# Patient Record
Sex: Female | Born: 1935 | Race: White | Hispanic: No | Marital: Married | Smoking: Never smoker
Health system: Southern US, Community
[De-identification: ages and names within clinical notes are randomized; demographics above are authoritative.]

## PROBLEM LIST (undated history)

## (undated) DIAGNOSIS — E079 Disorder of thyroid, unspecified: Secondary | ICD-10-CM

## (undated) DIAGNOSIS — T7840XA Allergy, unspecified, initial encounter: Secondary | ICD-10-CM

## (undated) DIAGNOSIS — N390 Urinary tract infection, site not specified: Secondary | ICD-10-CM

## (undated) DIAGNOSIS — I10 Essential (primary) hypertension: Secondary | ICD-10-CM

## (undated) DIAGNOSIS — K2270 Barrett's esophagus without dysplasia: Secondary | ICD-10-CM

## (undated) DIAGNOSIS — C801 Malignant (primary) neoplasm, unspecified: Secondary | ICD-10-CM

## (undated) DIAGNOSIS — K219 Gastro-esophageal reflux disease without esophagitis: Secondary | ICD-10-CM

## (undated) HISTORY — DX: Urinary tract infection, site not specified: N39.0

## (undated) HISTORY — PX: TONSILLECTOMY: SUR1361

## (undated) HISTORY — PX: POLYPECTOMY: SHX149

## (undated) HISTORY — DX: Barrett's esophagus without dysplasia: K22.70

## (undated) HISTORY — DX: Allergy, unspecified, initial encounter: T78.40XA

## (undated) HISTORY — PX: TUBAL LIGATION: SHX77

## (undated) HISTORY — DX: Gastro-esophageal reflux disease without esophagitis: K21.9

## (undated) HISTORY — DX: Essential (primary) hypertension: I10

## (undated) HISTORY — PX: TOTAL THYROIDECTOMY: SHX2547

## (undated) HISTORY — DX: Disorder of thyroid, unspecified: E07.9

## (undated) HISTORY — PX: APPENDECTOMY: SHX54

## (undated) HISTORY — PX: COLONOSCOPY: SHX174

## (undated) HISTORY — DX: Malignant (primary) neoplasm, unspecified: C80.1

## (undated) HISTORY — PX: COLON SURGERY: SHX602

---

## 1998-05-18 ENCOUNTER — Other Ambulatory Visit: Admission: RE | Admit: 1998-05-18 | Discharge: 1998-05-18 | Payer: Self-pay | Admitting: Obstetrics & Gynecology

## 1999-05-21 ENCOUNTER — Other Ambulatory Visit: Admission: RE | Admit: 1999-05-21 | Discharge: 1999-05-21 | Payer: Self-pay | Admitting: Obstetrics & Gynecology

## 2000-01-16 ENCOUNTER — Encounter: Payer: Self-pay | Admitting: Obstetrics & Gynecology

## 2000-01-16 ENCOUNTER — Ambulatory Visit (HOSPITAL_COMMUNITY): Admission: RE | Admit: 2000-01-16 | Discharge: 2000-01-16 | Payer: Self-pay | Admitting: Obstetrics & Gynecology

## 2000-01-17 ENCOUNTER — Encounter (INDEPENDENT_AMBULATORY_CARE_PROVIDER_SITE_OTHER): Payer: Self-pay | Admitting: Specialist

## 2000-01-17 ENCOUNTER — Other Ambulatory Visit: Admission: RE | Admit: 2000-01-17 | Discharge: 2000-01-17 | Payer: Self-pay | Admitting: Obstetrics & Gynecology

## 2000-04-28 ENCOUNTER — Encounter: Admission: RE | Admit: 2000-04-28 | Discharge: 2000-04-28 | Payer: Self-pay | Admitting: *Deleted

## 2000-04-28 ENCOUNTER — Encounter: Payer: Self-pay | Admitting: *Deleted

## 2000-05-12 ENCOUNTER — Encounter: Payer: Self-pay | Admitting: *Deleted

## 2000-05-12 ENCOUNTER — Encounter: Admission: RE | Admit: 2000-05-12 | Discharge: 2000-05-12 | Payer: Self-pay | Admitting: *Deleted

## 2000-06-23 ENCOUNTER — Other Ambulatory Visit: Admission: RE | Admit: 2000-06-23 | Discharge: 2000-06-23 | Payer: Self-pay | Admitting: Obstetrics & Gynecology

## 2001-03-26 ENCOUNTER — Encounter: Admission: RE | Admit: 2001-03-26 | Discharge: 2001-03-26 | Payer: Self-pay | Admitting: Internal Medicine

## 2001-03-26 ENCOUNTER — Encounter: Payer: Self-pay | Admitting: Internal Medicine

## 2001-03-30 ENCOUNTER — Encounter: Admission: RE | Admit: 2001-03-30 | Discharge: 2001-03-30 | Payer: Self-pay | Admitting: Urology

## 2001-03-30 ENCOUNTER — Encounter: Payer: Self-pay | Admitting: Urology

## 2001-04-14 ENCOUNTER — Encounter: Admission: RE | Admit: 2001-04-14 | Discharge: 2001-05-12 | Payer: Self-pay | Admitting: Orthopedic Surgery

## 2001-05-13 ENCOUNTER — Encounter: Admission: RE | Admit: 2001-05-13 | Discharge: 2001-05-13 | Payer: Self-pay | Admitting: Internal Medicine

## 2001-05-13 ENCOUNTER — Encounter: Payer: Self-pay | Admitting: Internal Medicine

## 2002-01-18 ENCOUNTER — Encounter: Admission: RE | Admit: 2002-01-18 | Discharge: 2002-01-18 | Payer: Self-pay | Admitting: Internal Medicine

## 2002-01-18 ENCOUNTER — Encounter: Payer: Self-pay | Admitting: Internal Medicine

## 2002-05-25 ENCOUNTER — Encounter: Payer: Self-pay | Admitting: Internal Medicine

## 2002-05-25 ENCOUNTER — Encounter: Admission: RE | Admit: 2002-05-25 | Discharge: 2002-05-25 | Payer: Self-pay | Admitting: Internal Medicine

## 2002-07-14 ENCOUNTER — Other Ambulatory Visit: Admission: RE | Admit: 2002-07-14 | Discharge: 2002-07-14 | Payer: Self-pay | Admitting: Obstetrics & Gynecology

## 2002-08-18 ENCOUNTER — Ambulatory Visit (HOSPITAL_COMMUNITY): Admission: RE | Admit: 2002-08-18 | Discharge: 2002-08-18 | Payer: Self-pay | Admitting: Endocrinology

## 2002-08-18 ENCOUNTER — Encounter: Payer: Self-pay | Admitting: Endocrinology

## 2002-08-18 ENCOUNTER — Encounter (INDEPENDENT_AMBULATORY_CARE_PROVIDER_SITE_OTHER): Payer: Self-pay

## 2003-05-27 ENCOUNTER — Encounter: Admission: RE | Admit: 2003-05-27 | Discharge: 2003-05-27 | Payer: Self-pay | Admitting: Internal Medicine

## 2003-05-27 ENCOUNTER — Encounter: Payer: Self-pay | Admitting: Internal Medicine

## 2003-08-29 ENCOUNTER — Encounter: Admission: RE | Admit: 2003-08-29 | Discharge: 2003-08-29 | Payer: Self-pay | Admitting: Internal Medicine

## 2004-06-28 ENCOUNTER — Encounter: Admission: RE | Admit: 2004-06-28 | Discharge: 2004-06-28 | Payer: Self-pay | Admitting: Internal Medicine

## 2004-09-13 ENCOUNTER — Ambulatory Visit: Payer: Self-pay | Admitting: "Endocrinology

## 2004-09-21 ENCOUNTER — Other Ambulatory Visit: Admission: RE | Admit: 2004-09-21 | Discharge: 2004-09-21 | Payer: Self-pay | Admitting: Obstetrics & Gynecology

## 2004-10-25 ENCOUNTER — Ambulatory Visit: Payer: Self-pay | Admitting: "Endocrinology

## 2004-12-03 ENCOUNTER — Encounter (HOSPITAL_COMMUNITY): Admission: RE | Admit: 2004-12-03 | Discharge: 2005-03-03 | Payer: Self-pay | Admitting: "Endocrinology

## 2004-12-31 ENCOUNTER — Ambulatory Visit: Payer: Self-pay | Admitting: "Endocrinology

## 2005-02-11 ENCOUNTER — Ambulatory Visit: Payer: Self-pay | Admitting: "Endocrinology

## 2005-04-10 ENCOUNTER — Ambulatory Visit: Payer: Self-pay | Admitting: Internal Medicine

## 2005-09-18 ENCOUNTER — Encounter (HOSPITAL_COMMUNITY): Admission: RE | Admit: 2005-09-18 | Discharge: 2005-11-19 | Payer: Self-pay | Admitting: Endocrinology

## 2005-09-30 ENCOUNTER — Ambulatory Visit (HOSPITAL_COMMUNITY): Admission: RE | Admit: 2005-09-30 | Discharge: 2005-09-30 | Payer: Self-pay | Admitting: Endocrinology

## 2005-12-04 ENCOUNTER — Encounter: Admission: RE | Admit: 2005-12-04 | Discharge: 2005-12-04 | Payer: Self-pay | Admitting: Internal Medicine

## 2006-01-14 ENCOUNTER — Emergency Department (HOSPITAL_COMMUNITY): Admission: EM | Admit: 2006-01-14 | Discharge: 2006-01-15 | Payer: Self-pay | Admitting: Emergency Medicine

## 2006-12-09 ENCOUNTER — Encounter: Admission: RE | Admit: 2006-12-09 | Discharge: 2006-12-09 | Payer: Self-pay | Admitting: Internal Medicine

## 2006-12-11 ENCOUNTER — Encounter: Admission: RE | Admit: 2006-12-11 | Discharge: 2006-12-11 | Payer: Self-pay | Admitting: Internal Medicine

## 2007-08-26 ENCOUNTER — Other Ambulatory Visit: Admission: RE | Admit: 2007-08-26 | Discharge: 2007-08-26 | Payer: Self-pay | Admitting: Interventional Radiology

## 2007-08-26 ENCOUNTER — Encounter: Admission: RE | Admit: 2007-08-26 | Discharge: 2007-08-26 | Payer: Self-pay | Admitting: Endocrinology

## 2007-08-26 ENCOUNTER — Encounter (INDEPENDENT_AMBULATORY_CARE_PROVIDER_SITE_OTHER): Payer: Self-pay | Admitting: Interventional Radiology

## 2007-10-27 ENCOUNTER — Ambulatory Visit (HOSPITAL_COMMUNITY): Admission: RE | Admit: 2007-10-27 | Discharge: 2007-10-28 | Payer: Self-pay | Admitting: General Surgery

## 2007-10-27 ENCOUNTER — Encounter (INDEPENDENT_AMBULATORY_CARE_PROVIDER_SITE_OTHER): Payer: Self-pay | Admitting: General Surgery

## 2007-12-11 ENCOUNTER — Encounter: Admission: RE | Admit: 2007-12-11 | Discharge: 2007-12-11 | Payer: Self-pay | Admitting: Internal Medicine

## 2007-12-22 ENCOUNTER — Encounter: Admission: RE | Admit: 2007-12-22 | Discharge: 2007-12-22 | Payer: Self-pay | Admitting: Internal Medicine

## 2008-08-12 DIAGNOSIS — K2270 Barrett's esophagus without dysplasia: Secondary | ICD-10-CM

## 2008-08-12 HISTORY — DX: Barrett's esophagus without dysplasia: K22.70

## 2008-10-06 ENCOUNTER — Ambulatory Visit: Payer: Self-pay | Admitting: Internal Medicine

## 2008-10-20 ENCOUNTER — Encounter: Payer: Self-pay | Admitting: Internal Medicine

## 2008-10-20 ENCOUNTER — Ambulatory Visit: Payer: Self-pay | Admitting: Internal Medicine

## 2008-10-22 ENCOUNTER — Encounter: Payer: Self-pay | Admitting: Internal Medicine

## 2008-10-27 ENCOUNTER — Encounter: Payer: Self-pay | Admitting: Internal Medicine

## 2008-12-12 ENCOUNTER — Encounter: Admission: RE | Admit: 2008-12-12 | Discharge: 2008-12-12 | Payer: Self-pay | Admitting: Internal Medicine

## 2009-05-08 ENCOUNTER — Encounter: Admission: RE | Admit: 2009-05-08 | Discharge: 2009-06-09 | Payer: Self-pay | Admitting: Sports Medicine

## 2009-12-13 ENCOUNTER — Encounter: Admission: RE | Admit: 2009-12-13 | Discharge: 2009-12-13 | Payer: Self-pay | Admitting: Family Medicine

## 2010-09-02 ENCOUNTER — Encounter: Payer: Self-pay | Admitting: Internal Medicine

## 2010-10-24 ENCOUNTER — Other Ambulatory Visit: Payer: Self-pay | Admitting: Internal Medicine

## 2010-10-24 DIAGNOSIS — Z1231 Encounter for screening mammogram for malignant neoplasm of breast: Secondary | ICD-10-CM

## 2010-10-31 ENCOUNTER — Encounter: Payer: Self-pay | Admitting: Internal Medicine

## 2010-11-05 ENCOUNTER — Encounter (INDEPENDENT_AMBULATORY_CARE_PROVIDER_SITE_OTHER): Payer: Self-pay | Admitting: *Deleted

## 2010-11-08 NOTE — Letter (Signed)
Summary: Endo/Colon Letter  [facility]  [address] [place], [place] [address]   Phone: [phone]  Fax: [phone]      October 31, 2010 MRN: [id number]   [facility] [address] [place], [place]  [address]   Dear Ms. ALMON,   According to your medical record, it is time for you to schedule an Endoscopy/Colonoscopy . Endoscopic screeening is recommended for patients with certain upper digestive tract conditions because of associated increased risk for cancers of the upper digestive system. The American Cancer Society recommends Colonoscopy as a method to detect early colon cancer. Patients with a family history of colon cancer, or a personal history of colon polyps or inflammatory bowel disease are at increased risk.  This letter has been generated based on the recommendations made at the time of your prior procedure. If you feel that in your particular situation this may no longer apply, please contact our office.  Please call our office at [phone] to schedule this appointment or to update your records at your earliest convenience.  Thank you for cooperating with [place] to provide you with the very best care possible.   Sincerely,  Hedwig Morton. Juanda Chance, M.D.  [facility] Gastroenterology Division [phone]

## 2010-11-13 NOTE — Letter (Signed)
Summary: Pre Visit Letter Revised  [facility]  [address] [place], [place] [address]   Phone: [phone]  Fax: [phone]        11/05/2010 MRN: [id number] [facility] [address] [place], [place]  [address]             Procedure Date:  12-13-10           Recall Endo--Dr. Juanda Chance   Welcome to the Gastroenterology Division at [facility].    You are scheduled to see a nurse for your pre-procedure visit on 11-29-10 at 11:00a.m. on the 3rd floor at [facility], [address]  We ask that you try to arrive at our office 15 minutes prior to your appointment time to allow for check-in.  Please take a minute to review the attached form.  If you answer "Yes" to one or more of the questions on the first page, we ask that you call the person listed at your earliest opportunity.  If you answer "No" to all of the questions, please complete the rest of the form and bring it to your appointment.    Your nurse visit will consist of discussing your medical and surgical history, your immediate family medical history, and your medications.   If you are unable to list all of your medications on the form, please bring the medication bottles to your appointment and we will list them.  We will need to be aware of both prescribed and over the counter drugs.  We will need to know exact dosage information as well.    Please be prepared to read and sign documents such as consent forms, a financial agreement, and acknowledgement forms.  If necessary, and with your consent, a friend or relative is welcome to sit-in on the nurse visit with you.  Please bring your insurance card so that we may make a copy of it.  If your insurance requires a referral to see a specialist, please bring your referral form from your primary care physician.  No co-pay is required for this nurse visit.     If you cannot keep your appointment, please call [phone] to cancel or reschedule  prior to your appointment date.  This allows [place] the opportunity to schedule an appointment for another patient in need of care.    Thank you for choosing [facility] for your medical needs.  We appreciate the opportunity to care for you.  Please visit [place] at our website  to learn more about our practice.  Sincerely, The Gastroenterology Division

## 2010-11-29 ENCOUNTER — Ambulatory Visit (AMBULATORY_SURGERY_CENTER): Payer: Medicare Other | Admitting: *Deleted

## 2010-11-29 VITALS — Ht 66.0 in | Wt 155.0 lb

## 2010-11-29 DIAGNOSIS — K2270 Barrett's esophagus without dysplasia: Secondary | ICD-10-CM

## 2010-11-29 NOTE — Progress Notes (Signed)
**  PATIENT WANTS TO DISCUSS UGI SYMPTOMS SHE HAS BEEN EXPERIENCING LATELY, I.E. HOARSENESS.**

## 2010-12-12 ENCOUNTER — Encounter: Payer: Self-pay | Admitting: Internal Medicine

## 2010-12-13 ENCOUNTER — Ambulatory Visit (AMBULATORY_SURGERY_CENTER): Payer: Medicare Other | Admitting: Internal Medicine

## 2010-12-13 ENCOUNTER — Encounter: Payer: Self-pay | Admitting: Internal Medicine

## 2010-12-13 DIAGNOSIS — K2270 Barrett's esophagus without dysplasia: Secondary | ICD-10-CM

## 2010-12-13 DIAGNOSIS — K2970 Gastritis, unspecified, without bleeding: Secondary | ICD-10-CM

## 2010-12-13 DIAGNOSIS — K2940 Chronic atrophic gastritis without bleeding: Secondary | ICD-10-CM

## 2010-12-13 DIAGNOSIS — K219 Gastro-esophageal reflux disease without esophagitis: Secondary | ICD-10-CM

## 2010-12-13 MED ORDER — SODIUM CHLORIDE 0.9 % IV SOLN: 500.0000 mL | INTRAVENOUS | Status: DC

## 2010-12-13 NOTE — Patient Instructions (Signed)
Barrett's esophagus and gastritis was seen today and biopsies were taken.  You will receive a letter in the mail with in 2 - 3 weeks with the biopsy results.  Informational handout were given to your care partner.  Please resume your prior medications today.  Call with any questions or concerns.  Also continue you acid reducer ( PPI).

## 2010-12-14 ENCOUNTER — Telehealth: Payer: Self-pay | Admitting: *Deleted

## 2010-12-14 NOTE — Telephone Encounter (Signed)
Follow up Call- Patient questions:  Do you have a fever, pain , or abdominal swelling? yes Pain Score  2 *  Have you tolerated food without any problems? yes  Have you been able to return to your normal activities? yes  Do you have any questions about your discharge instructions: Diet   no Medications  no Follow up visit  no  Do you have questions or concerns about your Care? no  Actions: * If pain score is 4 or above: No action needed, pain <4.  Still having a little gas pain.  Instructed to call us back if pain worsens.

## 2010-12-17 ENCOUNTER — Ambulatory Visit
Admission: RE | Admit: 2010-12-17 | Discharge: 2010-12-17 | Disposition: A | Discharge status: Home / Self Care | Payer: Medicare Other | Source: Ambulatory Visit | Attending: Internal Medicine | Admitting: Internal Medicine

## 2010-12-17 DIAGNOSIS — Z1231 Encounter for screening mammogram for malignant neoplasm of breast: Secondary | ICD-10-CM

## 2010-12-18 ENCOUNTER — Encounter: Payer: Self-pay | Admitting: Internal Medicine

## 2010-12-25 NOTE — Op Note (Signed)
Regina [name], Regina [name]                  ACCOUNT NO.:  [id number]   MEDICAL RECORD NO.:  [id number]          PATIENT TYPE:  OIB   LOCATION:  0098                         FACILITY:  [facility]   PHYSICIAN:  Angelia Mould. Derrell Lolling, M.D.DATE OF BIRTH:  1933-05-06   DATE OF PROCEDURE:  10/27/2007  DATE OF DISCHARGE:                               OPERATIVE REPORT   PREOPERATIVE DIAGNOSIS:  Multinodular goiter.   POSTOPERATIVE DIAGNOSIS:  Multinodular goiter.   OPERATION PERFORMED:  Total thyroidectomy.   SURGEON:  Angelia Mould. Derrell Lolling, M.D.   FIRST ASSISTANT:  Anselm Pancoast. Zachery Dakins, M.D.   OPERATIVE INDICATIONS:  This is a 77 year old white female with a remote  history of colon cancer with no known recurrence to date.  More recently  in February 2007 she underwent iodine-131 ablation of a hypertrophic  goiter and hyperthyroidism.  Her hyperthyroidism resolved and she is now  stable on Synthroid.  She felt a nodule in her gland more recently.  Ultrasound showed multiple nodules bilaterally, at least three of which  were greater than 1.5 cm.  She has had multiple needle biopsies, all of  which show slight nuclear enlargement, abundant cytoplasm, felt to be  adenomatous nodules versus low-grade follicular neoplasm.  Dr. Dorisann Frames sent her to me for consideration of thyroidectomy.  I felt that  this was reasonable and she was counseled as an outpatient.  She is  brought to operating room electively.   OPERATIVE TECHNIQUE:  Following the induction of general endotracheal  anesthesia, the patient was identified as correct patient and correct  procedure.  Her neck was extended with a small roll behind her shoulders  and placed in a reverse Trendelenburg position.  The neck was prepped  and draped in sterile fashion.  Intravenous antibiotics were given.  A  curved transverse collar incision was made about 2 cm above the  suprasternal notch.  Dissection was carried down through subcutaneous  tissue and  platysma muscle.  Skin and platysma flaps were raised  superiorly and inferiorly and a self-retaining retractor was placed.  Strap muscles were divided in the midline dissected off of the right and  left thyroid lobes.   She had a prominent nodule on the right side of the isthmus, a prominent  nodule on the right lobe, and a small bit palpable nodule on the left  lobe.  The thyroid gland itself was relatively small.   We dissected the left side of the thyroid gland first.  We dissected the  superior pole from the surrounding tissues.  We isolated the superior  pole vascular structures and ligated them in continuity with 2-0 silk  ties.  We placed a metal clip on the superior aspect of this for extra  security and then divided the tissue.  We mobilized the lower pole  vessels dividing vascular structures with small metal clips or the  harmonic scalpel.  We identified the superior and inferior parathyroid  glands on the left side and they were preserved.  We mobilized the rest  of the gland from lateral to medial, being very careful to  stay in the  capsule of the gland, staying well away from the recurrent laryngeal  nerve, and then mobilized the left thyroid lobe and the isthmus up off  of the trachea.  We dissected all the tissue up in the pyramidal lobe  area, although the pyramidal lobe was very small.   We then turned our attention to the right thyroid lobe.  After  dissecting the strap muscles off, we isolated the superior pole vessels,  ligated them in continuity with 2-0 silk ties, placed a metal clip on  the superior aspect for security, and then divided this.  We mobilized  the lower pole on the right side using the harmonic scalpel, dividing  small vascular structures just to the level of the capsule of the gland.  I did not see the superior parathyroid gland on the right but we did see  the inferior parathyroid gland on the right and felt that was preserved.  We mobilized the  rest of the right thyroid lobe from lateral to medial  staying right on the capsule and easily stayed away from the recurrent  laryngeal nerve.  We dissected this up off of the trachea.  We placed a  silk suture in the right superior pole to orient the pathologist.  The  specimen was sent for routine histology.   We felt around and could not feel any enlarged lymph nodes anywhere.  Hemostasis was excellent and achieved with small metal clips,  electrocautery and Surgicel gauze.  After observing the wound for about  5 minutes, the Surgicel gauze was dry.  We felt that it was safe to  close.  Strap muscles were closed in the midline with interrupted  sutures of 3-0 Vicryl.  The platysma muscle was closed with interrupted  sutures of 3-0 Vicryl.  The skin was closed with a running subcuticular  suture of 4-0 Monocryl and Dermabond.  Clean bandages were placed and  the patient taken to the recovery room in stable condition.  Estimated  blood loss was about 25 mL.  Complications:  None.  Sponge, needle and  instrument counts were correct.      Angelia Mould. Derrell Lolling, M.D.  Electronically Signed     HMI/MEDQ  D:  10/27/2007  T:  10/27/2007  Job:  [id number]   cc:   Dorisann Frames, M.D.  Fax: [phone]   Ralene Ok, M.D.  Fax: [phone]

## 2011-04-22 ENCOUNTER — Ambulatory Visit (HOSPITAL_COMMUNITY)
Admission: RE | Admit: 2011-04-22 | Discharge: 2011-04-22 | Disposition: A | Discharge status: Home / Self Care | Payer: Medicare Other | Source: Ambulatory Visit | Attending: Internal Medicine | Admitting: Internal Medicine

## 2011-04-22 ENCOUNTER — Ambulatory Visit
Admission: RE | Admit: 2011-04-22 | Discharge: 2011-04-22 | Disposition: A | Discharge status: Home / Self Care | Payer: Medicare Other | Source: Ambulatory Visit | Attending: Internal Medicine | Admitting: Internal Medicine

## 2011-04-22 ENCOUNTER — Other Ambulatory Visit: Payer: Self-pay | Admitting: Internal Medicine

## 2011-04-22 DIAGNOSIS — R05 Cough: Secondary | ICD-10-CM

## 2011-04-22 DIAGNOSIS — R0602 Shortness of breath: Secondary | ICD-10-CM | POA: Insufficient documentation

## 2011-04-23 ENCOUNTER — Other Ambulatory Visit (HOSPITAL_COMMUNITY): Payer: Self-pay | Admitting: Internal Medicine

## 2011-04-23 DIAGNOSIS — R9431 Abnormal electrocardiogram [ECG] [EKG]: Secondary | ICD-10-CM

## 2011-04-24 ENCOUNTER — Ambulatory Visit (HOSPITAL_COMMUNITY): Payer: Medicare Other | Attending: Internal Medicine | Admitting: Radiology

## 2011-04-24 VITALS — Ht 66.0 in | Wt 155.0 lb

## 2011-04-24 DIAGNOSIS — R0609 Other forms of dyspnea: Secondary | ICD-10-CM

## 2011-04-24 DIAGNOSIS — R0789 Other chest pain: Secondary | ICD-10-CM

## 2011-04-24 DIAGNOSIS — I4510 Unspecified right bundle-branch block: Secondary | ICD-10-CM

## 2011-04-24 DIAGNOSIS — R9431 Abnormal electrocardiogram [ECG] [EKG]: Secondary | ICD-10-CM | POA: Insufficient documentation

## 2011-04-24 MED ORDER — TECHNETIUM TC 99M TETROFOSMIN IV KIT: 11.0000 | PACK | Freq: Once | INTRAVENOUS | Status: DC | PRN

## 2011-04-24 MED ORDER — REGADENOSON 0.4 MG/5ML IV SOLN: 0.4000 mg | Freq: Once | INTRAVENOUS | Status: DC

## 2011-04-24 MED ORDER — TECHNETIUM TC 99M TETROFOSMIN IV KIT: 33.0000 | PACK | Freq: Once | INTRAVENOUS | Status: DC | PRN

## 2011-04-24 NOTE — Progress Notes (Signed)
[facility] SITE 3 NUCLEAR MED [address] [place] [address] [phone]  Cardiology Nuclear Med Study  Regina [name] is a 77 y.o. female [id number] Nov 16, 1932   Nuclear Med Background Indication for Stress Test:  Evaluation for Ischemia and Abnormal EKG History:  Asthma and >45yrs ago MPS @ [facility]:OK per patient Cardiac Risk Factors: Hypertension and RBBB  Symptoms:  Chest Pressure.  (last date of chest discomfort was about one week ago), DOE, Fatigue and Palpitations   Nuclear Pre-Procedure Caffeine/Decaff Intake:  None NPO After: 9:00pm   Lungs:  Clear.  O2 Sat 99% on RA. IV 0.9% NS with Angio Cath:  20g  IV Site: L Antecubital  IV Started by:  Stanton Kidney, EMT-P  Chest Size (in):  36 Cup Size: C  Height: 5\' 6"  (1.676 m)  Weight:  155 lb (70.308 kg)  BMI:  Body mass index is 25.02 kg/(m^2). Tech Comments:  NA    Nuclear Med Study 1 or 2 day study: 1 day  Stress Test Type:  Treadmill/Lexiscan  Reading MD: Willa Rough, MD  Order Authorizing Provider:  Ralene Ok, MD  Resting Radionuclide: Technetium 40m Tetrofosmin  Resting Radionuclide Dose: 11.0 mCi   Stress Radionuclide:  Technetium 81m Tetrofosmin  Stress Radionuclide Dose: 33.0 mCi           Stress Protocol Rest HR: 62 Stress HR: 102  Rest BP: 136/62 Stress BP: 196/58  Exercise Time (min): 2:00 METS: n/a   Predicted Max HR: 144 bpm % Max HR: 70.83 bpm Rate Pressure Product: 01027   Dose of Adenosine (mg):  n/a Dose of Lexiscan: 0.4 mg  Dose of Atropine (mg): n/a Dose of Dobutamine: n/a mcg/kg/min (at max HR)  Stress Test Technologist: Smiley Houseman, CMA-N  Nuclear Technologist:  Domenic Polite, CNMT     Rest Procedure:  Myocardial perfusion imaging was performed at rest 45 minutes following the intravenous administration of Technetium 30m Tetrofosmin.  Rest ECG: RBBB  Stress Procedure:  The patient received IV Lexiscan 0.4 mg over 15-seconds with concurrent low level  exercise and then Technetium 1m Tetrofosmin was injected at 30-seconds while the patient continued walking one more minute.  There were no significant changes with Lexiscan, only rare PAC's.  Quantitative spect images were obtained after a 45-minute delay.  Stress ECG: No significant change from baseline ECG  QPS Raw Data Images:  Patient motion noted; appropriate software correction applied. Stress Images:  Normal homogeneous uptake in all areas of the myocardium. Rest Images:  Normal homogeneous uptake in all areas of the myocardium. Subtraction (SDS):  No evidence of ischemia. Transient Ischemic Dilatation (Normal <1.22):  0.97 Lung/Heart Ratio (Normal <0.45):  0.31  Quantitative Gated Spect Images QGS EDV:  57 ml QGS ESV:  11 ml QGS cine images:  Normal Wall Motion QGS EF: 80%  Impression Exercise Capacity:  Lexiscan with low level exercise. BP Response:  Normal blood pressure response. Clinical Symptoms:  Tired ECG Impression:  No significant ST segment change suggestive of ischemia. Comparison with Prior Nuclear Study: No images to compare  Overall Impression:  Normal stress nuclear study.  Willa Rough

## 2011-05-06 LAB — COMPREHENSIVE METABOLIC PANEL
ALT: 12
Albumin: 3.7
Alkaline Phosphatase: 84
BUN: 14
Chloride: 102
Potassium: 4
Sodium: 140
Total Bilirubin: 0.6

## 2011-05-06 LAB — URINALYSIS, ROUTINE W REFLEX MICROSCOPIC
Glucose, UA: NEGATIVE
Ketones, ur: NEGATIVE
Leukocytes, UA: NEGATIVE
Nitrite: NEGATIVE
Protein, ur: NEGATIVE
pH: 7

## 2011-05-06 LAB — TSH: TSH: 3.824

## 2011-05-06 LAB — DIFFERENTIAL
Basophils Absolute: 0.1
Basophils Relative: 1
Eosinophils Absolute: 0.2
Eosinophils Relative: 2
Monocytes Absolute: 0.4
Neutro Abs: 4

## 2011-05-06 LAB — URINE MICROSCOPIC-ADD ON

## 2011-05-06 LAB — CBC
HCT: 35.8 — ABNORMAL LOW
Hemoglobin: 12.4
Platelets: 229
WBC: 6.7

## 2011-05-08 NOTE — Progress Notes (Signed)
Addended by: Maple Hudson on: 05/08/2011 04:45 PM   Modules accepted: Orders, Level of Service

## 2011-11-15 ENCOUNTER — Other Ambulatory Visit: Payer: Self-pay | Admitting: Internal Medicine

## 2011-11-15 DIAGNOSIS — Z1231 Encounter for screening mammogram for malignant neoplasm of breast: Secondary | ICD-10-CM

## 2011-12-23 ENCOUNTER — Ambulatory Visit
Admission: RE | Admit: 2011-12-23 | Discharge: 2011-12-23 | Disposition: A | Discharge status: Home / Self Care | Payer: Medicare Other | Source: Ambulatory Visit | Attending: Internal Medicine | Admitting: Internal Medicine

## 2011-12-23 DIAGNOSIS — Z1231 Encounter for screening mammogram for malignant neoplasm of breast: Secondary | ICD-10-CM

## 2012-01-03 ENCOUNTER — Encounter (INDEPENDENT_AMBULATORY_CARE_PROVIDER_SITE_OTHER): Payer: Medicare Other | Admitting: Ophthalmology

## 2012-01-10 ENCOUNTER — Encounter (INDEPENDENT_AMBULATORY_CARE_PROVIDER_SITE_OTHER): Payer: Medicare Other | Admitting: Ophthalmology

## 2012-01-10 DIAGNOSIS — H2510 Age-related nuclear cataract, unspecified eye: Secondary | ICD-10-CM

## 2012-01-10 DIAGNOSIS — H35039 Hypertensive retinopathy, unspecified eye: Secondary | ICD-10-CM

## 2012-01-10 DIAGNOSIS — H43819 Vitreous degeneration, unspecified eye: Secondary | ICD-10-CM

## 2012-01-10 DIAGNOSIS — H35379 Puckering of macula, unspecified eye: Secondary | ICD-10-CM

## 2012-01-10 DIAGNOSIS — E11319 Type 2 diabetes mellitus with unspecified diabetic retinopathy without macular edema: Secondary | ICD-10-CM

## 2012-01-10 DIAGNOSIS — I10 Essential (primary) hypertension: Secondary | ICD-10-CM

## 2012-01-10 DIAGNOSIS — E1139 Type 2 diabetes mellitus with other diabetic ophthalmic complication: Secondary | ICD-10-CM

## 2012-01-30 ENCOUNTER — Ambulatory Visit (INDEPENDENT_AMBULATORY_CARE_PROVIDER_SITE_OTHER): Payer: Medicare Other | Admitting: Family Medicine

## 2012-01-30 VITALS — BP 160/63 | HR 59 | Temp 97.7°F | Resp 18 | Wt 150.6 lb

## 2012-01-30 DIAGNOSIS — R7303 Prediabetes: Secondary | ICD-10-CM

## 2012-01-30 DIAGNOSIS — K219 Gastro-esophageal reflux disease without esophagitis: Secondary | ICD-10-CM

## 2012-01-30 DIAGNOSIS — L0390 Cellulitis, unspecified: Secondary | ICD-10-CM

## 2012-01-30 DIAGNOSIS — I8310 Varicose veins of unspecified lower extremity with inflammation: Secondary | ICD-10-CM

## 2012-01-30 DIAGNOSIS — R609 Edema, unspecified: Secondary | ICD-10-CM

## 2012-01-30 DIAGNOSIS — R7309 Other abnormal glucose: Secondary | ICD-10-CM

## 2012-01-30 DIAGNOSIS — I872 Venous insufficiency (chronic) (peripheral): Secondary | ICD-10-CM

## 2012-01-30 DIAGNOSIS — L0291 Cutaneous abscess, unspecified: Secondary | ICD-10-CM

## 2012-01-30 MED ORDER — DOXYCYCLINE HYCLATE 100 MG PO TABS: 100.0000 mg | ORAL_TABLET | Freq: Two times a day (BID) | ORAL | Status: AC

## 2012-01-30 MED ORDER — CLOBETASOL PROPIONATE 0.05 % EX OINT: TOPICAL_OINTMENT | Freq: Two times a day (BID) | CUTANEOUS | Status: AC

## 2012-01-30 NOTE — Progress Notes (Signed)
Subjective: 78 year old patient whose regular physician is out of the country for a while. She has been treating her legs with a cream that he gave her 2 or 3 times a day. She has a history of a rash on her legs. Recently has been getting worse, with more redness and itching. Her swelling had actually been doing better and she doesn't take the Lasix every day, though she has resumed taking it since the rash was getting worse. Otherwise she seemed to be doing well. Has a little area of rash on the left forearm also. She does bruise fairly easily.  Patient says that she is prediabetic and watches her dietary intake some. She apparently is not diabetic however.  Objective: Reviewed her medicines with her. 2-3+ pitting edema of the ankles, right greater than left. She has an erythematous rash up the shins, worse on the right than the left. There are couple of areas that are slightly crusted. I was able to culture one of the. As mentioned above there is a small area of erythema also in the left mid forearm.  Assessment  stasis dermatitis Possible cellulitis Edema  Plan: Continue her current medications. Be careful of salt intake. Take the diuretic regularly for now to try to keep the fluid down.  Doxycycline 100 mg twice a day Clobetasol cream twice a day  Return if worse.

## 2012-01-30 NOTE — Patient Instructions (Addendum)
Continue taking your Lasix and trying to avoid excessive salt to keep the fluid in the legs down. Elevate swollen legs when possible. Discontinue the cream you have at home Begin clobetasol cream twice daily on the legs. Take your xyzal antihistamine for itching Doxycycline one twice daily for antibiotic. Take it with food because of your heartburn history.

## 2012-02-03 LAB — WOUND CULTURE

## 2012-09-11 ENCOUNTER — Other Ambulatory Visit: Payer: Self-pay

## 2012-11-17 ENCOUNTER — Other Ambulatory Visit: Payer: Self-pay

## 2012-11-17 DIAGNOSIS — Z1231 Encounter for screening mammogram for malignant neoplasm of breast: Secondary | ICD-10-CM

## 2012-12-02 ENCOUNTER — Encounter: Payer: Self-pay | Admitting: Internal Medicine

## 2012-12-21 ENCOUNTER — Encounter: Payer: Self-pay | Admitting: Internal Medicine

## 2012-12-23 ENCOUNTER — Ambulatory Visit: Payer: Medicare Other

## 2012-12-23 ENCOUNTER — Ambulatory Visit
Admission: RE | Admit: 2012-12-23 | Discharge: 2012-12-23 | Disposition: A | Discharge status: Home / Self Care | Payer: Medicare Other | Source: Ambulatory Visit

## 2012-12-23 DIAGNOSIS — Z1231 Encounter for screening mammogram for malignant neoplasm of breast: Secondary | ICD-10-CM

## 2013-01-14 ENCOUNTER — Ambulatory Visit (INDEPENDENT_AMBULATORY_CARE_PROVIDER_SITE_OTHER): Payer: Medicare Other | Admitting: Ophthalmology

## 2013-01-14 DIAGNOSIS — H2510 Age-related nuclear cataract, unspecified eye: Secondary | ICD-10-CM

## 2013-01-14 DIAGNOSIS — H35039 Hypertensive retinopathy, unspecified eye: Secondary | ICD-10-CM

## 2013-01-14 DIAGNOSIS — I10 Essential (primary) hypertension: Secondary | ICD-10-CM

## 2013-01-14 DIAGNOSIS — H35379 Puckering of macula, unspecified eye: Secondary | ICD-10-CM

## 2013-01-14 DIAGNOSIS — H43819 Vitreous degeneration, unspecified eye: Secondary | ICD-10-CM

## 2013-01-14 DIAGNOSIS — E11319 Type 2 diabetes mellitus with unspecified diabetic retinopathy without macular edema: Secondary | ICD-10-CM

## 2013-01-14 DIAGNOSIS — E1139 Type 2 diabetes mellitus with other diabetic ophthalmic complication: Secondary | ICD-10-CM

## 2013-02-16 ENCOUNTER — Encounter: Payer: Self-pay | Admitting: *Deleted

## 2013-02-16 ENCOUNTER — Telehealth: Payer: Self-pay | Admitting: Internal Medicine

## 2013-02-16 ENCOUNTER — Ambulatory Visit (INDEPENDENT_AMBULATORY_CARE_PROVIDER_SITE_OTHER): Payer: Medicare Other | Admitting: Internal Medicine

## 2013-02-16 ENCOUNTER — Other Ambulatory Visit: Payer: Self-pay | Admitting: *Deleted

## 2013-02-16 VITALS — BP 140/62 | HR 72 | Ht 66.0 in | Wt 152.1 lb

## 2013-02-16 DIAGNOSIS — K625 Hemorrhage of anus and rectum: Secondary | ICD-10-CM

## 2013-02-16 DIAGNOSIS — K2270 Barrett's esophagus without dysplasia: Secondary | ICD-10-CM

## 2013-02-16 DIAGNOSIS — K648 Other hemorrhoids: Secondary | ICD-10-CM

## 2013-02-16 MED ORDER — HYDROCORTISONE ACETATE 25 MG RE SUPP: 25.0000 mg | Freq: Every day | RECTAL | Status: DC

## 2013-02-16 MED ORDER — HYDROCORTISONE ACE-PRAMOXINE 2.5-1 % RE CREA: TOPICAL_CREAM | Freq: Three times a day (TID) | RECTAL | Status: DC

## 2013-02-16 NOTE — Telephone Encounter (Signed)
Unable to reach patient because voice mail is not set up. Will try again later.

## 2013-02-16 NOTE — Telephone Encounter (Signed)
Spoke with patient and she is having problems with hemorrhoids. States she has been using Preparation H and it is not helping. She is having pain and some bleeding when she wipes. She is planning on going out of town next week and would like to have something for this. Scheduled with Dr. Juanda Chance today at 3:00 PM.

## 2013-02-16 NOTE — Patient Instructions (Addendum)
You have been scheduled for an endoscopy with propofol. Please follow written instructions given to you at your visit today. If you use inhalers (even only as needed), please bring them with you on the day of your procedure. Your physician has requested that you go to www.startemmi.com and enter the access code given to you at your visit today. This web site gives a general overview about your procedure. However, you should still follow specific instructions given to you by our office regarding your preparation for the procedure.  We have sent the following medications to your pharmacy for you to pick up at your convenience: Anusol Analpram  Please purchase the following medications over the counter and take as directed: Metamucil  CC: Dr Ralene Ok

## 2013-02-16 NOTE — Progress Notes (Signed)
Regina [name] 1933-07-04 MRN [id number]  History of Present Illness:  This is a 79 year old, white female with low-volume rectal bleeding this morning associated with vigorous wiping after a sticky bowel movement which she has been having. She feels sore and irritated in the rectum. She had a screening colonoscopy in March 2010 for followup of colon cancer which was resected in 1996 and she had followup colonoscopies in 1987, 1990, 1995, 2000 and 2005. She also has a history of Barrett's esophagus which has been followed with periodic endoscopies and she is due to have one next week. She complains of constipation for which she takes herbal tea.   Past Medical History  Diagnosis Date  . Allergy   . Hypertension   . Thyroid disease   . GERD (gastroesophageal reflux disease)   . Barrett esophagus 2010   Past Surgical History  Procedure Laterality Date  . Colon surgery    . Total thyroidectomy    . Tubal ligation    . Appendectomy    . Tonsillectomy      reports that she has never smoked. She has never used smokeless tobacco. She reports that she does not drink alcohol or use illicit drugs. family history is not on file. Allergies  Allergen Reactions  . Penicillins     REACTION: itching, hives        Review of Systems: Constipation. Incomplete evacuation and rectal irritation  The remainder of the 10 point ROS is negative except as outlined in H&P   Physical Exam: General appearance  Well developed, in no distress. Eyes- non icteric. HEENT nontraumatic, normocephalic. Mouth no lesions, tongue papillated, no cheilosis. Neck supple without adenopathy, thyroid not enlarged, no carotid bruits, no JVD. Lungs Clear to auscultation bilaterally. Cor normal S1, normal S2, regular rhythm, no murmur,  quiet precordium. Abdomen: Soft nontender with normal active bowel sounds. No distention. Well-healed surgical scar, liver edge at costal margin Rectal: And anoscopic exam reveals normal  perianal area large external hemorrhoidal tags. Normal rectal sphincter tone. Small first grade internal hemorrhoids which don't appear to be irritated, one of them is somewhat edematous; the rest of them are small. There is no thrombosis and no proctitis. Stool is Hemoccult positive. Extremities no pedal edema. Skin no lesions. Neurological alert and oriented x 3. Psychological normal mood and affect.  Assessment and Plan:  Problem #17 79 year old white female with a history of a sigmoid carcinoma resected in 1996. Her last colonoscopy was completed in 2010. Today, she has low-volume hematochezia clearly related to an external hemorrhoidal tag that appeared to be rather sore from wiping too vigorously. I have discussed with the patient the use of Tucks wipes to clean the external hemorrhoids. She also will need Metamucil 1 teaspoon daily to bulk up her stools. She may use herbal tea as needed. We will give her Analpram cream 2.5% and Anusol-HC suppositories at bedtime to reduce her hemorrhoids. She will be due for a recall colonoscopy in 2017.  Problem #2 Barrett's esophagus. His last upper endoscopy in May 2012 did not show evidence of Barrett's esophagus. His last endoscopy which showed the intestinal metaplasia was in March 2005.   02/16/2013 Lina Sar

## 2013-03-03 ENCOUNTER — Ambulatory Visit (AMBULATORY_SURGERY_CENTER): Payer: Medicare Other | Admitting: Internal Medicine

## 2013-03-03 ENCOUNTER — Encounter: Payer: Self-pay | Admitting: Internal Medicine

## 2013-03-03 VITALS — BP 164/74 | HR 56 | Temp 97.7°F | Resp 23 | Ht 66.0 in | Wt 152.0 lb

## 2013-03-03 DIAGNOSIS — K2270 Barrett's esophagus without dysplasia: Secondary | ICD-10-CM

## 2013-03-03 DIAGNOSIS — K219 Gastro-esophageal reflux disease without esophagitis: Secondary | ICD-10-CM

## 2013-03-03 DIAGNOSIS — K2970 Gastritis, unspecified, without bleeding: Secondary | ICD-10-CM

## 2013-03-03 DIAGNOSIS — K2990 Gastroduodenitis, unspecified, without bleeding: Secondary | ICD-10-CM

## 2013-03-03 MED ORDER — SODIUM CHLORIDE 0.9 % IV SOLN: 500.0000 mL | INTRAVENOUS | Status: DC

## 2013-03-03 NOTE — Op Note (Signed)
[facility] [address] [place], [address]   ENDOSCOPY PROCEDURE REPORT  PATIENT: Regina [name], Regina [name]  MR#: [id number] BIRTHDATE: 24-Apr-1933 , 79  yrs. old GENDER: Female ENDOSCOPIST: Hart Carwin, MD REFERRED BY:  Ralene Ok, M.D. PROCEDURE DATE:  03/03/2013 PROCEDURE:  EGD w/ biopsy ASA CLASS:     Class II INDICATIONS:  Barrett's esophagus 2005 and 1020, no Barrett's in 2012, GERD controlled on PPI. MEDICATIONS: MAC sedation, administered by CRNA and propofol (Diprivan) 100mg  IV TOPICAL ANESTHETIC: none  DESCRIPTION OF PROCEDURE: After the risks benefits and alternatives of the procedure were thoroughly explained, informed consent was obtained.  The LB [id number] endoscope was introduced through the mouth and advanced to the second portion of the duodenum. Without limitations.  The instrument was slowly withdrawn as the mucosa was fully examined.      esophagus: Proximal and mid and distal esophageal mucosa appeared normal. The Z line was regular and there was no esophageal stricture. Multiple biopsies were taken from the Z line.  Stomach: There was a 3-4 cm nonreducible hiatal hernia which did not show any evidence of Cameron erosions. Gastric body and gastric antrum appeared normal. There was slight decrease in the rugal pattern. Biopsies were taken from the gastric antrum to rule out intestinal metaplasia pyloric outlet was normal. Retroflexion of the endoscope confirmed normal fundus and cardia Duodenum: Duodenal bulb and descending duodenum were normal[ The scope was then withdrawn from the patient and the procedure completed.  COMPLICATIONS: There were no complications. ENDOSCOPIC IMPRESSION:  History of Barrett's esophagus in 2005 and 2010, no Barrett's in 2012.  Normal-appearing GE junction. Status post biopsies to rule out Barrett's esophagus 3-4 cm nonreducible hiatal hernia Random biopsies of the gastric antrum to rule out  intestinal metaplasia RECOMMENDATIONS: 1.  Await pathology results 2.  Anti-reflux regimen to be follow 3.  Continue PPI  REPEAT EXAM: for EGD pending biopsy results.  eSigned:  Hart Carwin, MD 03/03/2013 8:32 AM   CC:  PATIENT NAME:  Regina [name], Regina [name] MR#: [id number]

## 2013-03-03 NOTE — Patient Instructions (Addendum)
Discharge instructions given with verbal understanding. Biopsies taken. Resume previous medications. YOU HAD AN ENDOSCOPIC PROCEDURE TODAY AT THE [facility]: Refer to the procedure report that was given to you for any specific questions about what was found during the examination.  If the procedure report does not answer your questions, please call your gastroenterologist to clarify.  If you requested that your care partner not be given the details of your procedure findings, then the procedure report has been included in a sealed envelope for you to review at your convenience later.  YOU SHOULD EXPECT: Some feelings of bloating in the abdomen. Passage of more gas than usual.  Walking can help get rid of the air that was put into your GI tract during the procedure and reduce the bloating. If you had a lower endoscopy (such as a colonoscopy or flexible sigmoidoscopy) you may notice spotting of blood in your stool or on the toilet paper. If you underwent a bowel prep for your procedure, then you may not have a normal bowel movement for a few days.  DIET: Your first meal following the procedure should be a light meal and then it is ok to progress to your normal diet.  A half-sandwich or bowl of soup is an example of a good first meal.  Heavy or fried foods are harder to digest and may make you feel nauseous or bloated.  Likewise meals heavy in dairy and vegetables can cause extra gas to form and this can also increase the bloating.  Drink plenty of fluids but you should avoid alcoholic beverages for 24 hours.  ACTIVITY: Your care partner should take you home directly after the procedure.  You should plan to take it easy, moving slowly for the rest of the day.  You can resume normal activity the day after the procedure however you should NOT DRIVE or use heavy machinery for 24 hours (because of the sedation medicines used during the test).    SYMPTOMS TO REPORT IMMEDIATELY: A gastroenterologist  can be reached at any hour.  During normal business hours, 8:30 AM to 5:00 PM Monday through Friday, call [phone].  After hours and on weekends, please call the GI answering service at [phone] who will take a message and have the physician on call contact you.   Following upper endoscopy (EGD)  Vomiting of blood or coffee ground material  New chest pain or pain under the shoulder blades  Painful or persistently difficult swallowing  New shortness of breath  Fever of 100F or higher  Black, tarry-looking stools  FOLLOW UP: If any biopsies were taken you will be contacted by phone or by letter within the next 1-3 weeks.  Call your gastroenterologist if you have not heard about the biopsies in 3 weeks.  Our staff will call the home number listed on your records the next business day following your procedure to check on you and address any questions or concerns that you may have at that time regarding the information given to you following your procedure. This is a courtesy call and so if there is no answer at the home number and we have not heard from you through the emergency physician on call, we will assume that you have returned to your regular daily activities without incident.  SIGNATURES/CONFIDENTIALITY: You and/or your care partner have signed paperwork which will be entered into your electronic medical record.  These signatures attest to the fact that that the information above on your After   Visit Summary has been reviewed and is understood.  Full responsibility of the confidentiality of this discharge information lies with you and/or your care-partner. 

## 2013-03-03 NOTE — Progress Notes (Signed)
Patient did not experience any of the following events: a burn prior to discharge; a fall within the facility; wrong site/side/patient/procedure/implant event; or a hospital transfer or hospital admission upon discharge from the facility. (G8907) Patient did not have preoperative order for IV antibiotic SSI prophylaxis. (G8918)  

## 2013-03-03 NOTE — Progress Notes (Signed)
Called to room to assist during endoscopic procedure.  Patient ID and intended procedure confirmed with present staff. Received instructions for my participation in the procedure from the performing physician.  

## 2013-03-05 ENCOUNTER — Telehealth: Payer: Self-pay

## 2013-03-05 NOTE — Telephone Encounter (Signed)
Left message on answering machine. 

## 2013-03-08 ENCOUNTER — Encounter: Payer: Self-pay | Admitting: Internal Medicine

## 2013-04-06 ENCOUNTER — Other Ambulatory Visit: Payer: Self-pay

## 2013-06-03 ENCOUNTER — Other Ambulatory Visit: Payer: Self-pay | Admitting: *Deleted

## 2013-06-03 DIAGNOSIS — I83893 Varicose veins of bilateral lower extremities with other complications: Secondary | ICD-10-CM

## 2013-07-02 ENCOUNTER — Encounter: Payer: Self-pay | Admitting: Surgery

## 2013-07-05 ENCOUNTER — Encounter: Payer: Self-pay | Admitting: Surgery

## 2013-07-05 ENCOUNTER — Ambulatory Visit (HOSPITAL_COMMUNITY)
Admission: RE | Admit: 2013-07-05 | Discharge: 2013-07-05 | Disposition: A | Discharge status: Home / Self Care | Payer: Medicare Other | Source: Ambulatory Visit | Attending: Surgery | Admitting: Surgery

## 2013-07-05 ENCOUNTER — Ambulatory Visit (INDEPENDENT_AMBULATORY_CARE_PROVIDER_SITE_OTHER): Payer: Medicare Other | Admitting: Surgery

## 2013-07-05 VITALS — BP 157/58 | HR 60 | Ht 66.0 in | Wt 149.1 lb

## 2013-07-05 DIAGNOSIS — I83893 Varicose veins of bilateral lower extremities with other complications: Secondary | ICD-10-CM

## 2013-07-05 NOTE — Progress Notes (Signed)
Patient name: Regina [name] MRN: [id number] DOB: 1932-12-11 Sex: female   Referred by: Dr Ludwig Clarks  Reason for referral:  Chief Complaint  Patient presents with  . Varicose Veins    bilateral LE swelling, treatment in R leg in past - c/o itching and tenderness in both legs    HISTORY OF PRESENT ILLNESS: This is a 79 year old female who comes in today for evaluation of bilateral leg swelling.  She states her symptoms have been going on for many years.  She is worried about the discoloration in her legs.  She complains that they hurt and swell particularly when she's been on her feet for a long time.  She has been wearing knee-high compression stockings.  She denies any active ulceration.  The patient is medically managed for hypertension.  She does take Lasix to help with swelling.  Past Medical History  Diagnosis Date  . Allergy   . Hypertension   . Thyroid disease   . GERD (gastroesophageal reflux disease)   . Barrett esophagus 2010  . Cancer     Past Surgical History  Procedure Laterality Date  . Colon surgery    . Total thyroidectomy    . Tubal ligation    . Appendectomy    . Tonsillectomy    . Colonoscopy    . Polypectomy      History   Social History  . Marital Status: Married    Spouse Name: N/A    Number of Children: N/A  . Years of Education: N/A   Occupational History  . Not on file.   Social History Main Topics  . Smoking status: Never Smoker   . Smokeless tobacco: Never Used  . Alcohol Use: No  . Drug Use: No  . Sexual Activity: Not on file   Other Topics Concern  . Not on file   Social History Narrative  . No narrative on file    Family History  Problem Relation Age of Onset  . Heart disease Mother   . Heart disease Father   . Diabetes Sister   . COPD Brother   . Diabetes Brother     Allergies as of 07/05/2013 - Review Complete 07/05/2013  Allergen Reaction Noted  . Penicillins  10/06/2008    Current Outpatient Prescriptions  on File Prior to Visit  Medication Sig Dispense Refill  . enalapril (VASOTEC) 5 MG tablet Take 5 mg by mouth daily.        Marland Kitchen esomeprazole (NEXIUM) 40 MG capsule Take 40 mg by mouth daily before breakfast.      . furosemide (LASIX) 40 MG tablet Take 40 mg by mouth daily.        Marland Kitchen levocetirizine (XYZAL) 5 MG tablet Take 5 mg by mouth every evening.        . triamcinolone (NASACORT) 55 MCG/ACT nasal inhaler 2 sprays by Nasal route daily.        . hydrocortisone (ANUSOL-HC) 25 MG suppository Place 1 suppository (25 mg total) rectally at bedtime.  12 suppository  0  . hydrocortisone-pramoxine (ANALPRAM-HC) 2.5-1 % rectal cream Place rectally 3 (three) times daily.  30 g  0  . ibuprofen (ADVIL,MOTRIN) 200 MG tablet Take 200 mg by mouth every 8 (eight) hours as needed.        Marland Kitchen levothyroxine (SYNTHROID, LEVOTHROID) 100 MCG tablet Take 100 mcg by mouth daily.         No current facility-administered medications on file prior to visit.  REVIEW OF SYSTEMS: Cardiovascular: Positive for pain in legs and walking and lying flat, history of phlebitis, leg swelling Pulmonary: Positive for asthma Neurologic: No weakness, paresthesias, aphasia, or amaurosis. No dizziness. Hematologic: No bleeding problems or clotting disorders. Musculoskeletal: No joint pain or joint swelling. Gastrointestinal: No blood in stool or hematemesis Genitourinary: No dysuria or hematuria. Psychiatric:: No history of major depression. Integumentary: No rashes or ulcers. Constitutional: No fever or chills.  PHYSICAL EXAMINATION: General: The patient appears their stated age.  Vital signs are BP 157/58  Pulse 60  Ht 5\' 6"  (1.676 m)  Wt 149 lb 1.6 oz (67.631 kg)  BMI 24.08 kg/m2  SpO2 100% HEENT:  No gross abnormalities Pulmonary: Respirations are non-labored Abdomen: Soft and non-tender  Musculoskeletal: There are no major deformities.   Neurologic: No focal weakness or paresthesias are detected, Skin: There are no  ulcer or rashes noted. Psychiatric: The patient has normal affect. Cardiovascular: There is a regular rate and rhythm without significant murmur appreciated.  No carotid bruits.  Palpable pedal pulses bilaterally.  The patient has brawny discoloration in bilateral lower extremities with 1-2+ pitting edema.  Multiple spider veins/telangiectasias are visualized in both legs.  Diagnostic Studies: Venous duplex ultrasound was performed and reviewed by myself today.  This is reflux in bilateral deep systems in the proximal left great saphenous    Assessment:  Bilateral venous insufficiency Plan: I discussed the ultrasound findings today with the patient.  Because her legs are essentially equal for as far as her symptomatology, I have recommended 20-30 mmHg compression.  I do not think she would get much benefit from laser ablation of the proximal left great saphenous vein at this time.  I have stressed the importance of wearing compression stockings and leg elevation, so as to minimize the chance of ulceration in the future.  She will contact me again should her symptoms become more severe.  If that is the case I would repeat her venous ultrasound.     Jorge Ny, M.D. [facility] Office: [phone] Pager:  [phone]

## 2013-07-14 ENCOUNTER — Other Ambulatory Visit: Payer: Self-pay | Admitting: Dermatology

## 2013-12-09 ENCOUNTER — Other Ambulatory Visit: Payer: Self-pay

## 2013-12-09 DIAGNOSIS — Z1231 Encounter for screening mammogram for malignant neoplasm of breast: Secondary | ICD-10-CM

## 2013-12-28 ENCOUNTER — Ambulatory Visit
Admission: RE | Admit: 2013-12-28 | Discharge: 2013-12-28 | Disposition: A | Discharge status: Home / Self Care | Payer: Medicare Other | Source: Ambulatory Visit

## 2013-12-28 ENCOUNTER — Encounter (INDEPENDENT_AMBULATORY_CARE_PROVIDER_SITE_OTHER): Payer: Self-pay

## 2013-12-28 DIAGNOSIS — Z1231 Encounter for screening mammogram for malignant neoplasm of breast: Secondary | ICD-10-CM

## 2014-01-14 ENCOUNTER — Ambulatory Visit (INDEPENDENT_AMBULATORY_CARE_PROVIDER_SITE_OTHER): Payer: Medicare Other | Admitting: Ophthalmology

## 2014-01-14 DIAGNOSIS — H43819 Vitreous degeneration, unspecified eye: Secondary | ICD-10-CM

## 2014-01-14 DIAGNOSIS — I10 Essential (primary) hypertension: Secondary | ICD-10-CM

## 2014-01-14 DIAGNOSIS — H35379 Puckering of macula, unspecified eye: Secondary | ICD-10-CM

## 2014-01-14 DIAGNOSIS — E1165 Type 2 diabetes mellitus with hyperglycemia: Secondary | ICD-10-CM

## 2014-01-14 DIAGNOSIS — E1139 Type 2 diabetes mellitus with other diabetic ophthalmic complication: Secondary | ICD-10-CM

## 2014-01-14 DIAGNOSIS — E11319 Type 2 diabetes mellitus with unspecified diabetic retinopathy without macular edema: Secondary | ICD-10-CM

## 2014-01-14 DIAGNOSIS — H35039 Hypertensive retinopathy, unspecified eye: Secondary | ICD-10-CM

## 2014-03-28 ENCOUNTER — Other Ambulatory Visit: Payer: Self-pay | Admitting: Obstetrics & Gynecology

## 2014-03-29 LAB — CYTOLOGY - PAP

## 2014-04-27 ENCOUNTER — Other Ambulatory Visit: Payer: Self-pay | Admitting: Internal Medicine

## 2014-04-27 ENCOUNTER — Ambulatory Visit
Admission: RE | Admit: 2014-04-27 | Discharge: 2014-04-27 | Disposition: A | Discharge status: Home / Self Care | Payer: Medicare Other | Source: Ambulatory Visit | Attending: Internal Medicine | Admitting: Internal Medicine

## 2014-04-27 DIAGNOSIS — M549 Dorsalgia, unspecified: Secondary | ICD-10-CM

## 2014-04-27 DIAGNOSIS — M542 Cervicalgia: Secondary | ICD-10-CM

## 2014-07-15 ENCOUNTER — Encounter (HOSPITAL_COMMUNITY): Payer: Self-pay | Admitting: Emergency Medicine

## 2014-07-15 DIAGNOSIS — Z859 Personal history of malignant neoplasm, unspecified: Secondary | ICD-10-CM | POA: Diagnosis not present

## 2014-07-15 DIAGNOSIS — K219 Gastro-esophageal reflux disease without esophagitis: Secondary | ICD-10-CM | POA: Diagnosis not present

## 2014-07-15 DIAGNOSIS — Z7951 Long term (current) use of inhaled steroids: Secondary | ICD-10-CM | POA: Diagnosis not present

## 2014-07-15 DIAGNOSIS — E079 Disorder of thyroid, unspecified: Secondary | ICD-10-CM | POA: Insufficient documentation

## 2014-07-15 DIAGNOSIS — Y9289 Other specified places as the place of occurrence of the external cause: Secondary | ICD-10-CM | POA: Insufficient documentation

## 2014-07-15 DIAGNOSIS — Z79899 Other long term (current) drug therapy: Secondary | ICD-10-CM | POA: Diagnosis not present

## 2014-07-15 DIAGNOSIS — Z7952 Long term (current) use of systemic steroids: Secondary | ICD-10-CM | POA: Diagnosis not present

## 2014-07-15 DIAGNOSIS — Z880 Allergy status to penicillin: Secondary | ICD-10-CM | POA: Insufficient documentation

## 2014-07-15 DIAGNOSIS — S81812A Laceration without foreign body, left lower leg, initial encounter: Secondary | ICD-10-CM | POA: Insufficient documentation

## 2014-07-15 DIAGNOSIS — I10 Essential (primary) hypertension: Secondary | ICD-10-CM | POA: Diagnosis not present

## 2014-07-15 DIAGNOSIS — Y9321 Activity, ice skating: Secondary | ICD-10-CM | POA: Insufficient documentation

## 2014-07-15 DIAGNOSIS — Y998 Other external cause status: Secondary | ICD-10-CM | POA: Diagnosis not present

## 2014-07-15 DIAGNOSIS — K2270 Barrett's esophagus without dysplasia: Secondary | ICD-10-CM | POA: Diagnosis not present

## 2014-07-15 NOTE — ED Notes (Signed)
Pt. reports skin  laceration at left ankle sustained this evening from an ice skate , no bleeding at arrival , dressing applied prior to arrival .

## 2014-07-16 ENCOUNTER — Emergency Department (HOSPITAL_COMMUNITY)
Admission: EM | Admit: 2014-07-16 | Discharge: 2014-07-16 | Disposition: A | Discharge status: Home / Self Care | Payer: Medicare Other | Attending: Emergency Medicine | Admitting: Emergency Medicine

## 2014-07-16 DIAGNOSIS — S81812A Laceration without foreign body, left lower leg, initial encounter: Secondary | ICD-10-CM

## 2014-07-16 NOTE — Discharge Instructions (Signed)

## 2014-07-16 NOTE — ED Provider Notes (Signed)
Patient seen/examined in the Emergency Department in conjunction with Midlevel Provider [facility] Patient reports wound to left LE Exam : awake/alert, skin avulsion noted to left LE, no active bleeding Plan: wound care and d/c home    Sharyon Cable, MD 07/16/14 0031

## 2014-07-16 NOTE — ED Provider Notes (Signed)
CSN: [id number]     Arrival date & time 07/15/14  2312 History   First MD Initiated Contact with Patient 07/16/14 0016     Chief Complaint  Patient presents with  . Laceration     (Consider location/radiation/quality/duration/timing/severity/associated sxs/prior Treatment) Patient is a 80 y.o. female presenting with skin laceration. The history is provided by the patient. No language interpreter was used.  Laceration Location:  Leg Leg laceration location:  R lower leg Depth:  Cutaneous Quality: avulsion   Bleeding: controlled   Laceration mechanism:  Metal edge Foreign body present:  No foreign bodies Tetanus status:  Up to date   Past Medical History  Diagnosis Date  . Allergy   . Hypertension   . Thyroid disease   . GERD (gastroesophageal reflux disease)   . Barrett esophagus 2010  . Cancer    Past Surgical History  Procedure Laterality Date  . Colon surgery    . Total thyroidectomy    . Tubal ligation    . Appendectomy    . Tonsillectomy    . Colonoscopy    . Polypectomy     Family History  Problem Relation Age of Onset  . Heart disease Mother   . Heart disease Father   . Diabetes Sister   . COPD Brother   . Diabetes Brother    History  Substance Use Topics  . Smoking status: Never Smoker   . Smokeless tobacco: Never Used  . Alcohol Use: No   OB History    No data available     Review of Systems  Skin: Positive for wound.  All other systems reviewed and are negative.     Allergies  Penicillins  Home Medications   Prior to Admission medications   Medication Sig Start Date End Date Taking? Authorizing Provider  clotrimazole-betamethasone (LOTRISONE) cream Apply 1 application topically 2 (two) times daily.  06/25/13   Historical Provider, MD  enalapril (VASOTEC) 5 MG tablet Take 5 mg by mouth daily.      Historical Provider, MD  esomeprazole (NEXIUM) 40 MG capsule Take 40 mg by mouth daily before breakfast.    Historical Provider, MD    furosemide (LASIX) 40 MG tablet Take 40 mg by mouth daily.      Historical Provider, MD  hydrocortisone (ANUSOL-HC) 25 MG suppository Place 1 suppository (25 mg total) rectally at bedtime. 02/16/13   Lafayette Dragon, MD  hydrocortisone-pramoxine [facility]) 2.5-1 % rectal cream Place rectally 3 (three) times daily. 02/16/13   Lafayette Dragon, MD  ibuprofen (ADVIL,MOTRIN) 200 MG tablet Take 200 mg by mouth every 8 (eight) hours as needed.      Historical Provider, MD  levocetirizine (XYZAL) 5 MG tablet Take 5 mg by mouth every evening.      Historical Provider, MD  levothyroxine (SYNTHROID, LEVOTHROID) 100 MCG tablet Take 100 mcg by mouth daily.      Historical Provider, MD  SYNTHROID 88 MCG tablet Take 1 tablet by mouth daily. 07/05/13   Historical Provider, MD  triamcinolone (NASACORT) 55 MCG/ACT nasal inhaler 2 sprays by Nasal route daily.      Historical Provider, MD   BP 208/59 mmHg  Pulse 70  Temp(Src) 97.7 F (36.5 C)  Resp 16  Wt 151 lb 3 oz (68.578 kg)  SpO2 100% Physical Exam  Constitutional: She is oriented to person, place, and time. She appears well-developed and well-nourished.  HENT:  Head: Normocephalic.  Eyes: Conjunctivae are normal.  Neck: Neck supple.  Cardiovascular:  Normal rate and regular rhythm.   Pulmonary/Chest: Effort normal and breath sounds normal.  Musculoskeletal: She exhibits edema and tenderness.       Legs: Neurological: She is alert and oriented to person, place, and time.  Skin: Skin is warm and dry.  Psychiatric: She has a normal mood and affect.  Nursing note and vitals reviewed.   ED Course  Procedures (including critical care time) Labs Review Labs Reviewed - No data to display  Imaging Review No results found.   EKG Interpretation None     Patient discussed with and seen by Dr. Christy Gentles.    Wound on leg is primarily skin flap avulsion.  Skin debrided, wound cleaned, xeroform dressing applied.  MDM   Final diagnoses:  None     Flap avulsion left lower leg from blade of ice skate.  Patient states her immunizations are up to date--she will check with her PCP to confirm.  Wound care instructions provided, and return precautions discussed.  Patient to follow-up with PCP.    Norman Herrlich, NP 07/16/14 [place], MD 07/16/14 [phone]

## 2014-07-30 ENCOUNTER — Inpatient Hospital Stay (HOSPITAL_COMMUNITY)
Admission: EM | Admit: 2014-07-30 | Discharge: 2014-08-03 | DRG: 378 | Disposition: A | Discharge status: Home / Self Care | Payer: Medicare Other | Attending: Internal Medicine | Admitting: Internal Medicine

## 2014-07-30 ENCOUNTER — Emergency Department (HOSPITAL_COMMUNITY): Payer: Medicare Other

## 2014-07-30 ENCOUNTER — Encounter (HOSPITAL_COMMUNITY): Payer: Self-pay | Admitting: *Deleted

## 2014-07-30 DIAGNOSIS — L97909 Non-pressure chronic ulcer of unspecified part of unspecified lower leg with unspecified severity: Secondary | ICD-10-CM | POA: Diagnosis present

## 2014-07-30 DIAGNOSIS — K922 Gastrointestinal hemorrhage, unspecified: Secondary | ICD-10-CM | POA: Diagnosis present

## 2014-07-30 DIAGNOSIS — I4510 Unspecified right bundle-branch block: Secondary | ICD-10-CM | POA: Diagnosis present

## 2014-07-30 DIAGNOSIS — K5791 Diverticulosis of intestine, part unspecified, without perforation or abscess with bleeding: Principal | ICD-10-CM | POA: Diagnosis present

## 2014-07-30 DIAGNOSIS — I5032 Chronic diastolic (congestive) heart failure: Secondary | ICD-10-CM | POA: Diagnosis present

## 2014-07-30 DIAGNOSIS — E039 Hypothyroidism, unspecified: Secondary | ICD-10-CM | POA: Diagnosis present

## 2014-07-30 DIAGNOSIS — K921 Melena: Secondary | ICD-10-CM | POA: Diagnosis present

## 2014-07-30 DIAGNOSIS — W19XXXA Unspecified fall, initial encounter: Secondary | ICD-10-CM | POA: Diagnosis present

## 2014-07-30 DIAGNOSIS — S81811A Laceration without foreign body, right lower leg, initial encounter: Secondary | ICD-10-CM | POA: Diagnosis present

## 2014-07-30 DIAGNOSIS — Z85038 Personal history of other malignant neoplasm of large intestine: Secondary | ICD-10-CM | POA: Diagnosis not present

## 2014-07-30 DIAGNOSIS — R55 Syncope and collapse: Secondary | ICD-10-CM | POA: Diagnosis present

## 2014-07-30 DIAGNOSIS — Z8249 Family history of ischemic heart disease and other diseases of the circulatory system: Secondary | ICD-10-CM | POA: Diagnosis not present

## 2014-07-30 DIAGNOSIS — K219 Gastro-esophageal reflux disease without esophagitis: Secondary | ICD-10-CM | POA: Diagnosis present

## 2014-07-30 DIAGNOSIS — I951 Orthostatic hypotension: Secondary | ICD-10-CM | POA: Diagnosis present

## 2014-07-30 DIAGNOSIS — R109 Unspecified abdominal pain: Secondary | ICD-10-CM | POA: Diagnosis present

## 2014-07-30 DIAGNOSIS — I10 Essential (primary) hypertension: Secondary | ICD-10-CM | POA: Diagnosis present

## 2014-07-30 DIAGNOSIS — Y92012 Bathroom of single-family (private) house as the place of occurrence of the external cause: Secondary | ICD-10-CM

## 2014-07-30 DIAGNOSIS — D6489 Other specified anemias: Secondary | ICD-10-CM | POA: Diagnosis present

## 2014-07-30 DIAGNOSIS — K449 Diaphragmatic hernia without obstruction or gangrene: Secondary | ICD-10-CM | POA: Diagnosis present

## 2014-07-30 DIAGNOSIS — D62 Acute posthemorrhagic anemia: Secondary | ICD-10-CM | POA: Diagnosis present

## 2014-07-30 DIAGNOSIS — K625 Hemorrhage of anus and rectum: Secondary | ICD-10-CM | POA: Diagnosis present

## 2014-07-30 DIAGNOSIS — K648 Other hemorrhoids: Secondary | ICD-10-CM | POA: Diagnosis present

## 2014-07-30 DIAGNOSIS — K2270 Barrett's esophagus without dysplasia: Secondary | ICD-10-CM | POA: Diagnosis present

## 2014-07-30 LAB — BASIC METABOLIC PANEL
ANION GAP: 12 (ref 5–15)
BUN: 23 mg/dL (ref 6–23)
CO2: 28 mEq/L (ref 19–32)
CREATININE: 0.99 mg/dL (ref 0.50–1.10)
Calcium: 9.6 mg/dL (ref 8.4–10.5)
Chloride: 97 mEq/L (ref 96–112)
GFR, EST AFRICAN AMERICAN: 61 mL/min — AB (ref >90)
GFR, EST NON AFRICAN AMERICAN: 53 mL/min — AB (ref >90)
Glucose, Bld: 192 mg/dL — ABNORMAL HIGH (ref 70–99)
Potassium: 4 mEq/L (ref 3.7–5.3)
Sodium: 137 mEq/L (ref 137–147)

## 2014-07-30 LAB — CBC
HEMATOCRIT: 35.7 % — AB (ref 36.0–46.0)
Hemoglobin: 11.7 g/dL — ABNORMAL LOW (ref 12.0–15.0)
MCH: 30.7 pg (ref 26.0–34.0)
MCHC: 32.8 g/dL (ref 30.0–36.0)
MCV: 93.7 fL (ref 78.0–100.0)
PLATELETS: 247 10*3/uL (ref 150–400)
RBC: 3.81 MIL/uL — ABNORMAL LOW (ref 3.87–5.11)
RDW: 12.1 % (ref 11.5–15.5)
WBC: 13.2 10*3/uL — ABNORMAL HIGH (ref 4.0–10.5)

## 2014-07-30 LAB — URINALYSIS, ROUTINE W REFLEX MICROSCOPIC
Bilirubin Urine: NEGATIVE
GLUCOSE, UA: NEGATIVE mg/dL
Hgb urine dipstick: NEGATIVE
KETONES UR: NEGATIVE mg/dL
Nitrite: NEGATIVE
PH: 5.5 (ref 5.0–8.0)
PROTEIN: NEGATIVE mg/dL
Specific Gravity, Urine: 1.007 (ref 1.005–1.030)
Urobilinogen, UA: 0.2 mg/dL (ref 0.0–1.0)

## 2014-07-30 LAB — URINE MICROSCOPIC-ADD ON

## 2014-07-30 LAB — I-STAT TROPONIN, ED: Troponin i, poc: 0 ng/mL (ref 0.00–0.08)

## 2014-07-30 LAB — POC OCCULT BLOOD, ED: FECAL OCCULT BLD: POSITIVE — AB

## 2014-07-30 MED ORDER — SODIUM CHLORIDE 0.9 % IV BOLUS (SEPSIS)
1000.0000 mL | Freq: Once | INTRAVENOUS | Status: AC
  Administered 2014-07-30: 1000 mL via INTRAVENOUS

## 2014-07-30 MED ORDER — IOHEXOL 300 MG/ML  SOLN
100.0000 mL | Freq: Once | INTRAMUSCULAR | Status: AC | PRN
  Administered 2014-07-30: 100 mL via INTRAVENOUS

## 2014-07-30 MED ORDER — IOHEXOL 300 MG/ML  SOLN
25.0000 mL | Freq: Once | INTRAMUSCULAR | Status: AC | PRN
  Administered 2014-07-30: 25 mL via ORAL

## 2014-07-30 NOTE — ED Notes (Signed)
Informed CT patient is done with  contrast

## 2014-07-30 NOTE — ED Provider Notes (Signed)
CSN: [id number]     Arrival date & time 07/30/14  1758 History   First MD Initiated Contact with Patient 07/30/14 2021     Chief Complaint  Patient presents with  . Loss of Consciousness     (Consider location/radiation/quality/duration/timing/severity/associated sxs/prior Treatment) HPI Regina [name] is a 80 y.o. female with a history of colon cancer, appendectomy comes in for evaluation today of abdominal discomfort and syncopal episode. Patient states at approximately 5:00 PM she was getting up from the toilet, felt dizzy and proceeded to fall, suffering a laceration to the medial aspect of her right shin. Her husband was there at the time and reports she was out for "a second or 2". The patient denies any head trauma, nausea or vomiting, but does report an associated lower abdominal discomfort that she characterizes as a crampiness "like when I use to have my period". She also reports since having a bowel movement in the ED, she noticed over blood in the bowl as well as on her stool. She reports a history of chronic head and neck pain, but reports that has resolved since being in the ED. She denies fevers, chest pain, shortness of breath, nausea or vomiting, constipation or diarrhea, numbness or weakness, swelling in her legs.  Past Medical History  Diagnosis Date  . Allergy   . Hypertension   . Thyroid disease   . GERD (gastroesophageal reflux disease)   . Barrett esophagus 2010  . Cancer    Past Surgical History  Procedure Laterality Date  . Colon surgery    . Total thyroidectomy    . Tubal ligation    . Appendectomy    . Tonsillectomy    . Colonoscopy    . Polypectomy     Family History  Problem Relation Age of Onset  . Heart disease Mother   . Heart disease Father   . Diabetes Sister   . COPD Brother   . Diabetes Brother    History  Substance Use Topics  . Smoking status: Never Smoker   . Smokeless tobacco: Never Used  . Alcohol Use: No   OB History    No data  available     Review of Systems  Constitutional: Negative for fever.  HENT: Negative for sore throat.   Eyes: Negative for visual disturbance.  Respiratory: Negative for shortness of breath.   Cardiovascular: Negative for chest pain.  Gastrointestinal: Positive for abdominal pain and blood in stool.  Endocrine: Negative for polyuria.  Genitourinary: Negative for dysuria, hematuria and flank pain.  Skin: Negative for rash.  Neurological: Positive for syncope. Negative for headaches.      Allergies  Penicillins  Home Medications   Prior to Admission medications   Medication Sig Start Date End Date Taking? Authorizing Provider  acetaminophen (TYLENOL) 650 MG CR tablet Take 650 mg by mouth daily as needed for pain.   Yes Historical Provider, MD  clotrimazole-betamethasone (LOTRISONE) cream Apply 1 application topically 2 (two) times daily as needed (itching/burning/swelling).  06/25/13  Yes Historical Provider, MD  enalapril (VASOTEC) 5 MG tablet Take 5 mg by mouth at bedtime.    Yes Historical Provider, MD  esomeprazole (NEXIUM) 40 MG capsule Take 40 mg by mouth daily as needed (heartburn).    Yes Historical Provider, MD  furosemide (LASIX) 40 MG tablet Take 40 mg by mouth daily.     Yes Historical Provider, MD  ibuprofen (ADVIL,MOTRIN) 200 MG tablet Take 200 mg by mouth every 8 (eight) hours as  needed (pain).    Yes Historical Provider, MD  levocetirizine (XYZAL) 5 MG tablet Take 5 mg by mouth daily as needed for allergies.    Yes Historical Provider, MD  levothyroxine (SYNTHROID, LEVOTHROID) 88 MCG tablet Take 88 mcg by mouth daily before breakfast.   Yes Historical Provider, MD  Polyethyl Glycol-Propyl Glycol (SYSTANE OP) Place 1 drop into both eyes daily as needed (dry eyes).   Yes Historical Provider, MD  PRESCRIPTION MEDICATION Take 35 mg by mouth daily as needed (pain). Arthritis medication - samples from Dr. Murvin Donning   Yes Historical Provider, MD  triamcinolone (NASACORT)  55 MCG/ACT nasal inhaler Place 2 sprays into both nostrils daily as needed (congestion/allergies).    Yes Historical Provider, MD  trolamine salicylate (ASPERCREME) 10 % cream Apply 1 application topically daily as needed for muscle pain.   Yes Historical Provider, MD  hydrocortisone (ANUSOL-HC) 25 MG suppository Place 1 suppository (25 mg total) rectally at bedtime. Patient not taking: Reported on 07/30/2014 02/16/13   Lafayette Dragon, MD  hydrocortisone-pramoxine [facility]) 2.5-1 % rectal cream Place rectally 3 (three) times daily. Patient not taking: Reported on 07/30/2014 02/16/13   Lafayette Dragon, MD   BP 188/73 mmHg  Pulse 88  Temp(Src) 97.9 F (36.6 C)  Resp 15  Ht 5\' 7"  (1.702 m)  Wt 145 lb (65.772 kg)  BMI 22.71 kg/m2  SpO2 100% Physical Exam  Constitutional: She is oriented to person, place, and time. She appears well-developed and well-nourished. No distress.  HENT:  Head: Normocephalic and atraumatic.  Mucus membranes mildly dry.  Eyes: Conjunctivae are normal. Pupils are equal, round, and reactive to light. Right eye exhibits no discharge. Left eye exhibits no discharge. No scleral icterus.  Neck: Normal range of motion. Neck supple. No JVD present. No tracheal deviation present.  No tenderness to cervical spine or paraspinal muscles  Cardiovascular: Normal rate, regular rhythm, normal heart sounds and intact distal pulses.  Exam reveals no gallop and no friction rub.   No murmur heard. Pulmonary/Chest: Effort normal and breath sounds normal. No respiratory distress. She has no wheezes. She has no rales.  Abdominal: Soft.  Mild abdominal tenderness to palpation in right lower left lower quadrants. No umbilical or suprapubic tenderness. No distention, rebound or guarding. No other obvious lesions or deformities appreciated. No pulsatile masses  Genitourinary: Guaiac positive stool.  Soft stool in rectal vault. No overt blood . No prolapse or strangulated hemorrhoids   Musculoskeletal: Normal range of motion. She exhibits no edema or tenderness.  Neurological: She is alert and oriented to person, place, and time.  Cranial Nerves II-XII grossly intact  Skin: Skin is warm and dry. No rash noted. She is not diaphoretic.  Approximately 6 cm laceration/skin tear to medial aspect of right distal tibia.   Psychiatric: She has a normal mood and affect.  Nursing note and vitals reviewed.   ED Course  LACERATION REPAIR Date/Time: 07/31/2014 12:25 AM Performed by: Verl Dicker Authorized by: Verl Dicker Consent: Verbal consent obtained. Consent given by: patient Patient understanding: patient states understanding of the procedure being performed Patient identity confirmed: verbally with patient Time out: Immediately prior to procedure a "time out" was called to verify the correct patient, procedure, equipment, support staff and site/side marked as required. Body area: lower extremity Location details: right lower leg Laceration length: 6 cm Foreign bodies: no foreign bodies Tendon involvement: none Nerve involvement: none Vascular damage: no Irrigation solution: saline Amount of cleaning: standard Skin  closure: Steri-Strips Patient tolerance: Patient tolerated the procedure well with no immediate complications   (including critical care time) Labs Review Labs Reviewed  CBC - Abnormal; Notable for the following:    WBC 13.2 (*)    RBC 3.81 (*)    Hemoglobin 11.7 (*)    HCT 35.7 (*)    All other components within normal limits  BASIC METABOLIC PANEL - Abnormal; Notable for the following:    Glucose, Bld 192 (*)    GFR calc non Af Amer 53 (*)    GFR calc Af Amer 61 (*)    All other components within normal limits  URINALYSIS, ROUTINE W REFLEX MICROSCOPIC - Abnormal; Notable for the following:    Color, Urine STRAW (*)    Leukocytes, UA SMALL (*)    All other components within normal limits  URINE MICROSCOPIC-ADD ON - Abnormal;  Notable for the following:    Squamous Epithelial / LPF FEW (*)    Bacteria, UA FEW (*)    Casts HYALINE CASTS (*)    All other components within normal limits  POC OCCULT BLOOD, ED - Abnormal; Notable for the following:    Fecal Occult Bld POSITIVE (*)    All other components within normal limits  I-STAT TROPOININ, ED    Imaging Review Dg Chest 2 View  07/30/2014   CLINICAL DATA:  Acute onset of diarrhea and hematemesis. Loss of consciousness. Chills. Initial encounter.  EXAM: CHEST  2 VIEW  COMPARISON:  Chest radiograph performed 04/22/2011  FINDINGS: The lungs are hyperexpanded, with flattening of the hemidiaphragms, compatible with COPD. Mild chronic peribronchial thickening is noted. There is no evidence of focal opacification, pleural effusion or pneumothorax.  The heart is normal in size; the mediastinal contour is within normal limits. No acute osseous abnormalities are seen.  IMPRESSION: Findings of COPD; no acute cardiopulmonary process seen.   Electronically Signed   By: Garald Balding M.D.   On: 07/30/2014 22:36   Ct Abdomen Pelvis W Contrast  07/30/2014   CLINICAL DATA:  Acute onset of syncope today. Concern for abdominal injury. Initial encounter.  EXAM: CT ABDOMEN AND PELVIS WITH CONTRAST  TECHNIQUE: Multidetector CT imaging of the abdomen and pelvis was performed using the standard protocol following bolus administration of intravenous contrast.  CONTRAST:  162mL OMNIPAQUE IOHEXOL 300 MG/ML  SOLN  COMPARISON:  None.  FINDINGS: Mild right basilar atelectasis or scarring is noted.  The liver and spleen are unremarkable in appearance. The gallbladder is within normal limits. The pancreas and adrenal glands are unremarkable.  The kidneys are unremarkable in appearance. There is no evidence of hydronephrosis. No renal or ureteral stones are seen. No perinephric stranding is appreciated.  No free fluid is identified. The small bowel is unremarkable in appearance. The stomach is within  normal limits. No acute vascular abnormalities are seen. Scattered calcification is seen along the abdominal aorta and its branches.  The patient is status post appendectomy. The colon is largely decompressed. An apparent 1.9 cm wall lipoma is suggested along the distal transverse colon. Slight apparent colonic wall thickening along the transverse, descending and sigmoid colon is thought to reflect relative decompression.  The bladder is moderately distended and grossly unremarkable. The uterus is within normal limits. The ovaries are relatively symmetric. No suspicious adnexal masses are seen. No inguinal lymphadenopathy is seen.  No acute osseous abnormalities are identified.  IMPRESSION: 1. No acute abnormality seen within the abdomen or pelvis. 2. Scattered calcification along the abdominal aorta  and its branches. 3. Apparent 1.9 cm wall lipoma suggested along the distal transverse colon. Slight apparent colonic wall thickening is thought to reflect relative decompression. 4. Mild right basilar atelectasis or scarring noted.   Electronically Signed   By: Garald Balding M.D.   On: 07/30/2014 22:15     EKG Interpretation None     Meds given in ED:  Medications  sodium chloride 0.9 % bolus 1,000 mL (1,000 mLs Intravenous New Bag/Given 07/30/14 2123)  iohexol (OMNIPAQUE) 300 MG/ML solution 25 mL (25 mLs Oral Contrast Given 07/30/14 2132)  iohexol (OMNIPAQUE) 300 MG/ML solution 100 mL (100 mLs Intravenous Contrast Given 07/30/14 2158)    New Prescriptions   No medications on file   Filed Vitals:   07/30/14 2030 07/30/14 2100 07/30/14 2130 07/30/14 2305  BP: 150/47 151/53 167/57 188/73  Pulse: 75 84 89 88  Temp:      Resp: 17 14 17 15   Height:      Weight:      SpO2: 97% 96% 99% 100%    MDM  TANYLAH SCHNOEBELEN is a 80 y.o. female with a history of colon cancer and appendectomy comes in for evaluation after syncopal episode. At 5 PM she was trying to get up off the toilet, became lightheaded and  fell forward. She denies any head trauma. Denies loss of consciousness, but husband reports "she was out for one or 2 seconds". She sustained a skin tear to the medial aspect of her right shin during the fall. She now reports some lower abdominal discomfort. While in the ED, she reports overtly bloody stool.  Vitals stable - WNL -afebrile Pt resting comfortably in ED. alert and oriented 4 PE--mild abdominal tenderness in right lower and left lower quadrants. Hemoccult positive with no frank or overt bleeding. Labwork--hemoglobin 11.7, leukocytosis 13.2 Imaging-- CT abdomen showed no acute intra-abdominal pathology. Chest x-[place] shows no acute current pulmonary pathology. Discussed need for admission for further evaluation and management of her symptoms, pt very amenable to plan.  Prior to patient discharge, I discussed and reviewed this case with Dr. Aline Brochure, who also saw and admitted the patient   Spoke with Dr. Arnoldo Morale, Internal Medicine. Patient admitted Final diagnoses:  Syncope, unspecified syncope type  Lower GI bleeding        Verl Dicker, PA-C 07/31/14 [place], PA-C 07/31/14 0031  Pamella Pert, MD 07/31/14 [phone]

## 2014-07-30 NOTE — ED Notes (Addendum)
Pt reports having syncopal episode today when getting up to use restroom, denies hitting her head but is having neck and head pain. Has laceration to right lower leg, bandaged pta. No acute distress noted at triage.

## 2014-07-31 ENCOUNTER — Encounter (HOSPITAL_COMMUNITY): Payer: Self-pay

## 2014-07-31 DIAGNOSIS — I10 Essential (primary) hypertension: Secondary | ICD-10-CM

## 2014-07-31 DIAGNOSIS — K922 Gastrointestinal hemorrhage, unspecified: Secondary | ICD-10-CM | POA: Diagnosis present

## 2014-07-31 DIAGNOSIS — K625 Hemorrhage of anus and rectum: Secondary | ICD-10-CM | POA: Diagnosis present

## 2014-07-31 DIAGNOSIS — R55 Syncope and collapse: Secondary | ICD-10-CM | POA: Diagnosis present

## 2014-07-31 DIAGNOSIS — K219 Gastro-esophageal reflux disease without esophagitis: Secondary | ICD-10-CM | POA: Diagnosis present

## 2014-07-31 DIAGNOSIS — D6489 Other specified anemias: Secondary | ICD-10-CM | POA: Diagnosis present

## 2014-07-31 LAB — FERRITIN: Ferritin: 106 ng/mL (ref 10–291)

## 2014-07-31 LAB — CBC
HCT: 33.5 % — ABNORMAL LOW (ref 36.0–46.0)
HEMOGLOBIN: 11.2 g/dL — AB (ref 12.0–15.0)
MCH: 31.8 pg (ref 26.0–34.0)
MCHC: 33.4 g/dL (ref 30.0–36.0)
MCV: 95.2 fL (ref 78.0–100.0)
Platelets: 221 10*3/uL (ref 150–400)
RBC: 3.52 MIL/uL — AB (ref 3.87–5.11)
RDW: 12.3 % (ref 11.5–15.5)
WBC: 10 10*3/uL (ref 4.0–10.5)

## 2014-07-31 LAB — T4, FREE: Free T4: 1.18 ng/dL (ref 0.80–1.80)

## 2014-07-31 LAB — HEMOGLOBIN AND HEMATOCRIT, BLOOD
HCT: 30.4 % — ABNORMAL LOW (ref 36.0–46.0)
HEMATOCRIT: 31.7 % — AB (ref 36.0–46.0)
HEMOGLOBIN: 10.2 g/dL — AB (ref 12.0–15.0)
Hemoglobin: 10.1 g/dL — ABNORMAL LOW (ref 12.0–15.0)

## 2014-07-31 LAB — IRON AND TIBC
Iron: 39 ug/dL — ABNORMAL LOW (ref 42–135)
Saturation Ratios: 16 % — ABNORMAL LOW (ref 20–55)
TIBC: 240 ug/dL — ABNORMAL LOW (ref 250–470)
UIBC: 201 ug/dL (ref 125–400)

## 2014-07-31 LAB — BASIC METABOLIC PANEL
Anion gap: 13 (ref 5–15)
BUN: 17 mg/dL (ref 6–23)
CO2: 25 mEq/L (ref 19–32)
Calcium: 8.8 mg/dL (ref 8.4–10.5)
Chloride: 104 mEq/L (ref 96–112)
Creatinine, Ser: 0.9 mg/dL (ref 0.50–1.10)
GFR calc Af Amer: 69 mL/min — ABNORMAL LOW (ref >90)
GFR calc non Af Amer: 59 mL/min — ABNORMAL LOW (ref >90)
Glucose, Bld: 112 mg/dL — ABNORMAL HIGH (ref 70–99)
Potassium: 3.7 mEq/L (ref 3.7–5.3)
Sodium: 142 mEq/L (ref 137–147)

## 2014-07-31 LAB — FOLATE: Folate: >20 ng/mL

## 2014-07-31 LAB — TSH: TSH: 0.946 u[IU]/mL (ref 0.350–4.500)

## 2014-07-31 LAB — TYPE AND SCREEN
ABO/RH(D): A POS
Antibody Screen: NEGATIVE

## 2014-07-31 LAB — ABO/RH: ABO/RH(D): A POS

## 2014-07-31 LAB — RETICULOCYTES
RBC.: 3.52 MIL/uL — AB (ref 3.87–5.11)
Retic Count, Absolute: 35.2 10*3/uL (ref 19.0–186.0)
Retic Ct Pct: 1 % (ref 0.4–3.1)

## 2014-07-31 LAB — VITAMIN B12: VITAMIN B 12: 1024 pg/mL — AB (ref 211–911)

## 2014-07-31 LAB — CORTISOL: Cortisol, Plasma: 8.5 ug/dL

## 2014-07-31 MED ORDER — ACETAMINOPHEN 650 MG RE SUPP: 650.0000 mg | Freq: Four times a day (QID) | RECTAL | Status: DC | PRN

## 2014-07-31 MED ORDER — SODIUM CHLORIDE 0.9 % IJ SOLN
3.0000 mL | Freq: Two times a day (BID) | INTRAMUSCULAR | Status: DC
  Administered 2014-07-31 – 2014-08-03 (×5): 3 mL via INTRAVENOUS

## 2014-07-31 MED ORDER — HYDROMORPHONE HCL 1 MG/ML IJ SOLN: 0.5000 mg | INTRAMUSCULAR | Status: DC | PRN

## 2014-07-31 MED ORDER — FLUTICASONE PROPIONATE 50 MCG/ACT NA SUSP
1.0000 | Freq: Every day | NASAL | Status: DC
  Administered 2014-07-31 – 2014-08-03 (×4): 1 via NASAL
  Filled 2014-07-31 (×2): qty 16

## 2014-07-31 MED ORDER — SODIUM CHLORIDE 0.9 % IV SOLN
INTRAVENOUS | Status: DC
  Administered 2014-07-31 – 2014-08-01 (×3): via INTRAVENOUS

## 2014-07-31 MED ORDER — ONDANSETRON HCL 4 MG/2ML IJ SOLN: 4.0000 mg | Freq: Four times a day (QID) | INTRAMUSCULAR | Status: DC | PRN

## 2014-07-31 MED ORDER — PANTOPRAZOLE SODIUM 40 MG IV SOLR
40.0000 mg | Freq: Two times a day (BID) | INTRAVENOUS | Status: DC
  Administered 2014-07-31 – 2014-08-01 (×5): 40 mg via INTRAVENOUS
  Filled 2014-07-31 (×7): qty 40

## 2014-07-31 MED ORDER — OXYCODONE HCL 5 MG PO TABS: 5.0000 mg | ORAL_TABLET | ORAL | Status: DC | PRN

## 2014-07-31 MED ORDER — ALUM & MAG HYDROXIDE-SIMETH 200-200-20 MG/5ML PO SUSP: 30.0000 mL | Freq: Four times a day (QID) | ORAL | Status: DC | PRN

## 2014-07-31 MED ORDER — ONDANSETRON HCL 4 MG PO TABS: 4.0000 mg | ORAL_TABLET | Freq: Four times a day (QID) | ORAL | Status: DC | PRN

## 2014-07-31 MED ORDER — LEVOTHYROXINE SODIUM 88 MCG PO TABS
88.0000 ug | ORAL_TABLET | Freq: Every day | ORAL | Status: DC
  Administered 2014-07-31 – 2014-08-03 (×4): 88 ug via ORAL
  Filled 2014-07-31 (×5): qty 1

## 2014-07-31 MED ORDER — ENALAPRIL MALEATE 5 MG PO TABS
5.0000 mg | ORAL_TABLET | Freq: Every day | ORAL | Status: DC
Start: 2014-07-31 — End: 2014-08-03
  Administered 2014-07-31 – 2014-08-02 (×4): 5 mg via ORAL
  Filled 2014-07-31 (×5): qty 1

## 2014-07-31 MED ORDER — ACETAMINOPHEN 325 MG PO TABS
650.0000 mg | ORAL_TABLET | Freq: Four times a day (QID) | ORAL | Status: DC | PRN
  Administered 2014-08-02: 650 mg via ORAL
  Filled 2014-07-31: qty 2

## 2014-07-31 MED ORDER — POLYVINYL ALCOHOL 1.4 % OP SOLN: 1.0000 [drp] | Freq: Every day | OPHTHALMIC | Status: DC | PRN

## 2014-07-31 NOTE — H&P (Signed)
[facility] Admission History and Physical       Regina [name] NLZ:[id number] DOB: 1933/02/01 DOA: 07/30/2014  Referring physician: EDP PCP: [name], MD  Specialists:   Chief Complaint: Passed Out  HPI: Regina [name] is a 80 y.o. female with a history of HTN, Hypothyroid, and GERD who presents to the ED after suffering a syncopal episode at home.  At 5 pm she was getting up from using the bathroom, and she blacked out for a few seconds.   She collapsed and injured her right lower leg but did not hit her head.    Her husband witnessed the event.    When she arrived in the ED, she passed several bloody stools and had preceding crampy Lower ABD Pain.   Her admission Hemoglobin level was 11.7.   A  Ct Scan of the ABD was performed and was negative for acute findings.     Review of Systems:  Constitutional: No Weight Loss, No Weight Gain, Night Sweats, Fevers, Chills, Dizziness, Fatigue, or Generalized Weakness HEENT: No Headaches, Difficulty Swallowing,Tooth/Dental Problems,Sore Throat,  No Sneezing, Rhinitis, Ear Ache, Nasal Congestion, or Post Nasal Drip,  Cardio-vascular:  No Chest pain, Orthopnea, PND, Edema in Lower Extremities, Anasarca, Dizziness, Palpitations  Resp: No Dyspnea, No DOE, No Productive Cough, No Non-Productive Cough, No Hemoptysis, No Wheezing.    GI: No Heartburn, Indigestion, +Abdominal Pain, Nausea, Vomiting, Diarrhea, Hematemesis, +Hematochezia, Melena, Change in Bowel Habits,  Loss of Appetite  GU: No Dysuria, Change in Color of Urine, No Urgency or Frequency, No Flank pain.  Musculoskeletal: No Joint Pain or Swelling, No Decreased Range of Motion, No Back Pain.  Neurologic: +Syncope, No Seizures, Muscle Weakness, Paresthesia, Vision Disturbance or Loss, No Diplopia, No Vertigo, No Difficulty Walking,  Skin: No Rash or Lesions. Psych: No Change in Mood or Affect, No Depression or Anxiety, No Memory loss, No Confusion, or Hallucinations   Past Medical  History  Diagnosis Date  . Allergy   . Hypertension   . Thyroid disease   . GERD (gastroesophageal reflux disease)   . Barrett esophagus 2010  . Cancer       Past Surgical History  Procedure Laterality Date  . Colon surgery    . Total thyroidectomy    . Tubal ligation    . Appendectomy    . Tonsillectomy    . Colonoscopy    . Polypectomy         Prior to Admission medications   Medication Sig Start Date End Date Taking? Authorizing Provider  acetaminophen (TYLENOL) 650 MG CR tablet Take 650 mg by mouth daily as needed for pain.   Yes Historical Provider, MD  clotrimazole-betamethasone (LOTRISONE) cream Apply 1 application topically 2 (two) times daily as needed (itching/burning/swelling).  06/25/13  Yes Historical Provider, MD  enalapril (VASOTEC) 5 MG tablet Take 5 mg by mouth at bedtime.    Yes Historical Provider, MD  esomeprazole (NEXIUM) 40 MG capsule Take 40 mg by mouth daily as needed (heartburn).    Yes Historical Provider, MD  furosemide (LASIX) 40 MG tablet Take 40 mg by mouth daily.     Yes Historical Provider, MD  ibuprofen (ADVIL,MOTRIN) 200 MG tablet Take 200 mg by mouth every 8 (eight) hours as needed (pain).    Yes Historical Provider, MD  levocetirizine (XYZAL) 5 MG tablet Take 5 mg by mouth daily as needed for allergies.    Yes Historical Provider, MD  levothyroxine (SYNTHROID, LEVOTHROID) 88 MCG tablet Take 88 mcg  by mouth daily before breakfast.   Yes Historical Provider, MD  Polyethyl Glycol-Propyl Glycol (SYSTANE OP) Place 1 drop into both eyes daily as needed (dry eyes).   Yes Historical Provider, MD  PRESCRIPTION MEDICATION Take 35 mg by mouth daily as needed (pain). Arthritis medication - samples from Dr. Murvin Donning   Yes Historical Provider, MD  triamcinolone (NASACORT) 55 MCG/ACT nasal inhaler Place 2 sprays into both nostrils daily as needed (congestion/allergies).    Yes Historical Provider, MD  trolamine salicylate (ASPERCREME) 10 % cream Apply 1  application topically daily as needed for muscle pain.   Yes Historical Provider, MD  hydrocortisone (ANUSOL-HC) 25 MG suppository Place 1 suppository (25 mg total) rectally at bedtime. Patient not taking: Reported on 07/30/2014 02/16/13   Lafayette Dragon, MD  hydrocortisone-pramoxine [facility]) 2.5-1 % rectal cream Place rectally 3 (three) times daily. Patient not taking: Reported on 07/30/2014 02/16/13   Lafayette Dragon, MD      Allergies  Allergen Reactions  . Penicillins Hives and Itching     Social History:  reports that she has never smoked. She has never used smokeless tobacco. She reports that she does not drink alcohol or use illicit drugs.     Family History  Problem Relation Age of Onset  . Heart disease Mother   . Heart disease Father   . Diabetes Sister   . COPD Brother   . Diabetes Brother        Physical Exam:  GEN:  Pleasant Well Nourished and Well Developed Elderly 80 y.o.  African American female examined  and in no acute distress; cooperative with exam Filed Vitals:   07/30/14 2130 07/30/14 2305 07/30/14 2330 07/31/14 0000  BP: 167/57 188/73 157/60 169/67  Pulse: 89 88 92 85  Temp:      Resp: 17 15 14 11   Height:      Weight:      SpO2: 99% 100% 98% 98%   Blood pressure 169/67, pulse 85, temperature 97.9 F (36.6 C), resp. rate 11, height 5\' 7"  (1.702 m), weight 65.772 kg (145 lb), SpO2 98 %. PSYCH: She is alert and oriented x4; does not appear anxious does not appear depressed; affect is normal HEENT: Normocephalic and Atraumatic, Mucous membranes pink; PERRLA; EOM intact; Fundi:  Benign;  No scleral icterus, Nares: Patent, Oropharynx: Clear, Fair Dentition,    Neck:  FROM, No Cervical Lymphadenopathy nor Thyromegaly or Carotid Bruit; No JVD; Breasts:: Not examined CHEST WALL: No tenderness CHEST: Normal respiration, clear to auscultation bilaterally HEART: Regular rate and rhythm; no murmurs rubs or gallops BACK: No kyphosis or scoliosis; No CVA  tenderness ABDOMEN: Positive Bowel Sounds, Soft Non-Tender; No Masses, No Organomegaly. Rectal Exam: Not done EXTREMITIES: No Cyanosis, Clubbing, or Edema; No Ulcerations. Genitalia: not examined PULSES: 2+ and symmetric SKIN: Normal hydration no rash or ulceration CNS:  Alert and Oriented x 4, No Focal Deficits Vascular: pulses palpable throughout    Labs on Admission:  Basic Metabolic Panel:  Recent Labs Lab 07/30/14 1813  NA 137  K 4.0  CL 97  CO2 28  GLUCOSE 192*  BUN 23  CREATININE 0.99  CALCIUM 9.6   Liver Function Tests: No results for input(s): AST, ALT, ALKPHOS, BILITOT, PROT, ALBUMIN in the last 168 hours. No results for input(s): LIPASE, AMYLASE in the last 168 hours. No results for input(s): AMMONIA in the last 168 hours. CBC:  Recent Labs Lab 07/30/14 1813  WBC 13.2*  HGB 11.7*  HCT 35.7*  MCV 93.7  PLT 247   Cardiac Enzymes: No results for input(s): CKTOTAL, CKMB, CKMBINDEX, TROPONINI in the last 168 hours.  BNP (last 3 results) No results for input(s): PROBNP in the last 8760 hours. CBG: No results for input(s): GLUCAP in the last 168 hours.  Radiological Exams on Admission: Dg Chest 2 View  07/30/2014   CLINICAL DATA:  Acute onset of diarrhea and hematemesis. Loss of consciousness. Chills. Initial encounter.  EXAM: CHEST  2 VIEW  COMPARISON:  Chest radiograph performed 04/22/2011  FINDINGS: The lungs are hyperexpanded, with flattening of the hemidiaphragms, compatible with COPD. Mild chronic peribronchial thickening is noted. There is no evidence of focal opacification, pleural effusion or pneumothorax.  The heart is normal in size; the mediastinal contour is within normal limits. No acute osseous abnormalities are seen.  IMPRESSION: Findings of COPD; no acute cardiopulmonary process seen.   Electronically Signed   By: Garald Balding M.D.   On: 07/30/2014 22:36   Ct Abdomen Pelvis W Contrast  07/30/2014   CLINICAL DATA:  Acute onset of syncope  today. Concern for abdominal injury. Initial encounter.  EXAM: CT ABDOMEN AND PELVIS WITH CONTRAST  TECHNIQUE: Multidetector CT imaging of the abdomen and pelvis was performed using the standard protocol following bolus administration of intravenous contrast.  CONTRAST:  140mL OMNIPAQUE IOHEXOL 300 MG/ML  SOLN  COMPARISON:  None.  FINDINGS: Mild right basilar atelectasis or scarring is noted.  The liver and spleen are unremarkable in appearance. The gallbladder is within normal limits. The pancreas and adrenal glands are unremarkable.  The kidneys are unremarkable in appearance. There is no evidence of hydronephrosis. No renal or ureteral stones are seen. No perinephric stranding is appreciated.  No free fluid is identified. The small bowel is unremarkable in appearance. The stomach is within normal limits. No acute vascular abnormalities are seen. Scattered calcification is seen along the abdominal aorta and its branches.  The patient is status post appendectomy. The colon is largely decompressed. An apparent 1.9 cm wall lipoma is suggested along the distal transverse colon. Slight apparent colonic wall thickening along the transverse, descending and sigmoid colon is thought to reflect relative decompression.  The bladder is moderately distended and grossly unremarkable. The uterus is within normal limits. The ovaries are relatively symmetric. No suspicious adnexal masses are seen. No inguinal lymphadenopathy is seen.  No acute osseous abnormalities are identified.  IMPRESSION: 1. No acute abnormality seen within the abdomen or pelvis. 2. Scattered calcification along the abdominal aorta and its branches. 3. Apparent 1.9 cm wall lipoma suggested along the distal transverse colon. Slight apparent colonic wall thickening is thought to reflect relative decompression. 4. Mild right basilar atelectasis or scarring noted.   Electronically Signed   By: Garald Balding M.D.   On: 07/30/2014 22:15     Assessment/Plan:    80 y.o. female with  Active Problems:   1.   Rectal bleeding- probable Lower GI, Diverticular vs Hemorrhoidal   Monitor H/Hs   Transfuse PRN   GI Consult in AM       2.   Syncope- due to Orthostatic Hypotension caused by #1   Telemetry Monitoring    IVFs   Check Orthostatic Vitals q shift     3.   Hypertension   Continue Enalapril   Monitor BPs     4.   GERD (gastroesophageal reflux disease)   IV Protonix     5.   Anemia due to other cause- due to GI loss  and other causes   Anemia Panel sent   Monitor Trend     6.   DVT Prophylaxis   SCDs    Code Status: FULL CODE   Family Communication:    Husband at Bedside Disposition Plan:  Inpatient       Time spent:  Russell C [facility] Pager [phone]   If [place] Please Contact the Day Rounding Team MD for [facility]  If 7PM-7AM, Please Contact Night-Floor Coverage  [email or web address] Password [email or web address] 07/31/2014, 1:21 AM

## 2014-07-31 NOTE — Consult Note (Signed)
Consult Note for Regina [name]  Reason for Consult: Hematochezia and syncope Referring Physician: [facility]  Lafonda Mosses HPI: This is a 80 year old female with a PMH of GERD, hypothyroidism, and HTN admitted for brief syncope and hematochezia.  She became unconscious when she was in the restroom with a resultant injury to her right lower leg and head.  In fact, this was her second episode.  Her first episode was this past Wednesday, but she only had presyncope symptoms.  Lying down on the carpeted floor resolved her symptoms.  No prior issues with syncope and there was no associated bowel movement with these episodes.  While in the ER she had several bloody bowel movements associated with lower  abdominal pain, but the CT scan was negative for any overt pathology.  No clear evidence of diverticula or inflammation.  An EGD was performed by Dr. Olevia Perches on 03/04/2013 for a history of Barrett's esophagus, but no gross evidence of Barrett's was identified.  However, she did have a 3-4 cm hiatal hernia.  Her last colonoscopy was in 2010 with the expected finding of an intact sigmoid colon anastamosis at 20 cm from the anal verge.  On admission her HGB was in the 11 range.  Past Medical History  Diagnosis Date  . Allergy   . Hypertension   . Thyroid disease   . GERD (gastroesophageal reflux disease)   . Barrett esophagus 2010  . Cancer     Past Surgical History  Procedure Laterality Date  . Colon surgery    . Total thyroidectomy    . Tubal ligation    . Appendectomy    . Tonsillectomy    . Colonoscopy    . Polypectomy      Family History  Problem Relation Age of Onset  . Heart disease Mother   . Heart disease Father   . Diabetes Sister   . COPD Brother   . Diabetes Brother     Social History:  reports that she has never smoked. She has never used smokeless tobacco. She reports that she does not drink alcohol or use illicit drugs.  Allergies:  Allergies  Allergen Reactions  .  Penicillins Hives and Itching    Medications:  Scheduled: . enalapril  5 mg Oral QHS  . fluticasone  1 spray Each Nare Daily  . levothyroxine  88 mcg Oral QAC breakfast  . pantoprazole (PROTONIX) IV  40 mg Intravenous Q12H  . sodium chloride  3 mL Intravenous Q12H   Continuous: . sodium chloride 75 mL/hr at 07/31/14 1446    Results for orders placed or performed during the hospital encounter of 07/30/14 (from the past 24 hour(s))  CBC     Status: Abnormal   Collection Time: 07/30/14  6:13 PM  Result Value Ref Range   WBC 13.2 (H) 4.0 - 10.5 K/uL   RBC 3.81 (L) 3.87 - 5.11 MIL/uL   Hemoglobin 11.7 (L) 12.0 - 15.0 g/dL   HCT 35.7 (L) 36.0 - 46.0 %   MCV 93.7 78.0 - 100.0 fL   MCH 30.7 26.0 - 34.0 pg   MCHC 32.8 30.0 - 36.0 g/dL   RDW 12.1 11.5 - 15.5 %   Platelets 247 150 - 400 K/uL  Basic metabolic panel     Status: Abnormal   Collection Time: 07/30/14  6:13 PM  Result Value Ref Range   Sodium 137 137 - 147 mEq/L   Potassium 4.0 3.7 - 5.3 mEq/L   Chloride  97 96 - 112 mEq/L   CO2 28 19 - 32 mEq/L   Glucose, Bld 192 (H) 70 - 99 mg/dL   BUN 23 6 - 23 mg/dL   Creatinine, Ser 0.99 0.50 - 1.10 mg/dL   Calcium 9.6 8.4 - 10.5 mg/dL   GFR calc non Af Amer 53 (L) >90 mL/min   GFR calc Af Amer 61 (L) >90 mL/min   Anion gap 12 5 - 15  I-stat troponin, ED (not at [facility])     Status: None   Collection Time: 07/30/14  6:31 PM  Result Value Ref Range   Troponin i, poc 0.00 0.00 - 0.08 ng/mL   Comment 3          Urinalysis, Routine w reflex microscopic     Status: Abnormal   Collection Time: 07/30/14  8:03 PM  Result Value Ref Range   Color, Urine STRAW (A) YELLOW   APPearance CLEAR CLEAR   Specific Gravity, Urine 1.007 1.005 - 1.030   pH 5.5 5.0 - 8.0   Glucose, UA NEGATIVE NEGATIVE mg/dL   Hgb urine dipstick NEGATIVE NEGATIVE   Bilirubin Urine NEGATIVE NEGATIVE   Ketones, ur NEGATIVE NEGATIVE mg/dL   Protein, ur NEGATIVE NEGATIVE mg/dL   Urobilinogen, UA 0.2 0.0 - 1.0 mg/dL    Nitrite NEGATIVE NEGATIVE   Leukocytes, UA SMALL (A) NEGATIVE  Urine microscopic-add on     Status: Abnormal   Collection Time: 07/30/14  8:03 PM  Result Value Ref Range   Squamous Epithelial / LPF FEW (A) RARE   WBC, UA 3-6 <3 WBC/hpf   RBC / HPF 0-2 <3 RBC/hpf   Bacteria, UA FEW (A) RARE   Casts HYALINE CASTS (A) NEGATIVE  POC occult blood, ED Provider will collect     Status: Abnormal   Collection Time: 07/30/14  9:37 PM  Result Value Ref Range   Fecal Occult Bld POSITIVE (A) NEGATIVE  Type and screen     Status: None   Collection Time: 07/31/14  1:08 AM  Result Value Ref Range   ABO/RH(D) A POS    Antibody Screen NEG    Sample Expiration 08/03/2014   ABO/Rh     Status: None   Collection Time: 07/31/14  1:08 AM  Result Value Ref Range   ABO/RH(D) A POS   Vitamin B12     Status: Abnormal   Collection Time: 07/31/14  3:21 AM  Result Value Ref Range   Vitamin B-12 1024 (H) 211 - 911 pg/mL  Folate     Status: None   Collection Time: 07/31/14  3:21 AM  Result Value Ref Range   Folate >20.0 ng/mL  Iron and TIBC     Status: Abnormal   Collection Time: 07/31/14  3:21 AM  Result Value Ref Range   Iron 39 (L) 42 - 135 ug/dL   TIBC 240 (L) 250 - 470 ug/dL   Saturation Ratios 16 (L) 20 - 55 %   UIBC 201 125 - 400 ug/dL  Ferritin     Status: None   Collection Time: 07/31/14  3:21 AM  Result Value Ref Range   Ferritin 106 10 - 291 ng/mL  Reticulocytes     Status: Abnormal   Collection Time: 07/31/14  3:21 AM  Result Value Ref Range   Retic Ct Pct 1.0 0.4 - 3.1 %   RBC. 3.52 (L) 3.87 - 5.11 MIL/uL   Retic Count, Manual 35.2 19.0 - 186.0 K/uL  Basic metabolic panel  Status: Abnormal   Collection Time: 07/31/14  3:21 AM  Result Value Ref Range   Sodium 142 137 - 147 mEq/L   Potassium 3.7 3.7 - 5.3 mEq/L   Chloride 104 96 - 112 mEq/L   CO2 25 19 - 32 mEq/L   Glucose, Bld 112 (H) 70 - 99 mg/dL   BUN 17 6 - 23 mg/dL   Creatinine, Ser 0.90 0.50 - 1.10 mg/dL   Calcium 8.8  8.4 - 10.5 mg/dL   GFR calc non Af Amer 59 (L) >90 mL/min   GFR calc Af Amer 69 (L) >90 mL/min   Anion gap 13 5 - 15  CBC     Status: Abnormal   Collection Time: 07/31/14  3:21 AM  Result Value Ref Range   WBC 10.0 4.0 - 10.5 K/uL   RBC 3.52 (L) 3.87 - 5.11 MIL/uL   Hemoglobin 11.2 (L) 12.0 - 15.0 g/dL   HCT 33.5 (L) 36.0 - 46.0 %   MCV 95.2 78.0 - 100.0 fL   MCH 31.8 26.0 - 34.0 pg   MCHC 33.4 30.0 - 36.0 g/dL   RDW 12.3 11.5 - 15.5 %   Platelets 221 150 - 400 K/uL  Hemoglobin and hematocrit, blood     Status: Abnormal   Collection Time: 07/31/14  1:00 PM  Result Value Ref Range   Hemoglobin 10.2 (L) 12.0 - 15.0 g/dL   HCT 31.7 (L) 36.0 - 46.0 %  TSH     Status: None   Collection Time: 07/31/14  1:00 PM  Result Value Ref Range   TSH 0.946 0.350 - 4.500 uIU/mL     Dg Chest 2 View  07/30/2014   CLINICAL DATA:  Acute onset of diarrhea and hematemesis. Loss of consciousness. Chills. Initial encounter.  EXAM: CHEST  2 VIEW  COMPARISON:  Chest radiograph performed 04/22/2011  FINDINGS: The lungs are hyperexpanded, with flattening of the hemidiaphragms, compatible with COPD. Mild chronic peribronchial thickening is noted. There is no evidence of focal opacification, pleural effusion or pneumothorax.  The heart is normal in size; the mediastinal contour is within normal limits. No acute osseous abnormalities are seen.  IMPRESSION: Findings of COPD; no acute cardiopulmonary process seen.   Electronically Signed   By: Garald Balding M.D.   On: 07/30/2014 22:36   Ct Abdomen Pelvis W Contrast  07/30/2014   CLINICAL DATA:  Acute onset of syncope today. Concern for abdominal injury. Initial encounter.  EXAM: CT ABDOMEN AND PELVIS WITH CONTRAST  TECHNIQUE: Multidetector CT imaging of the abdomen and pelvis was performed using the standard protocol following bolus administration of intravenous contrast.  CONTRAST:  13mL OMNIPAQUE IOHEXOL 300 MG/ML  SOLN  COMPARISON:  None.  FINDINGS: Mild right  basilar atelectasis or scarring is noted.  The liver and spleen are unremarkable in appearance. The gallbladder is within normal limits. The pancreas and adrenal glands are unremarkable.  The kidneys are unremarkable in appearance. There is no evidence of hydronephrosis. No renal or ureteral stones are seen. No perinephric stranding is appreciated.  No free fluid is identified. The small bowel is unremarkable in appearance. The stomach is within normal limits. No acute vascular abnormalities are seen. Scattered calcification is seen along the abdominal aorta and its branches.  The patient is status post appendectomy. The colon is largely decompressed. An apparent 1.9 cm wall lipoma is suggested along the distal transverse colon. Slight apparent colonic wall thickening along the transverse, descending and sigmoid colon is thought to reflect  relative decompression.  The bladder is moderately distended and grossly unremarkable. The uterus is within normal limits. The ovaries are relatively symmetric. No suspicious adnexal masses are seen. No inguinal lymphadenopathy is seen.  No acute osseous abnormalities are identified.  IMPRESSION: 1. No acute abnormality seen within the abdomen or pelvis. 2. Scattered calcification along the abdominal aorta and its branches. 3. Apparent 1.9 cm wall lipoma suggested along the distal transverse colon. Slight apparent colonic wall thickening is thought to reflect relative decompression. 4. Mild right basilar atelectasis or scarring noted.   Electronically Signed   By: Garald Balding M.D.   On: 07/30/2014 22:15    ROS:  As stated above in the HPI otherwise negative.  Blood pressure 123/39, pulse 76, temperature 98.1 F (36.7 C), temperature source Oral, resp. rate 16, height 5\' 7"  (1.702 m), weight 65.454 kg (144 lb 4.8 oz), SpO2 98 %.    PE: Gen: NAD, Alert and Oriented HEENT:  [place]/AT, EOMI Neck: Supple, no LAD Lungs: CTA Bilaterally CV: RRR without M/G/R ABM: Soft, mild  lower abdominal tenderness, +BS Ext: No C/C/E Rectal: No masses, some fresh blood on the examination glove, no evidence of a hemorrhoidal source  Assessment/Plan: 1) Hematochezia. 2) Syncope. 3) Personal history of a sigmoid colon cancer.   It is clear that her syncope was not as a result of her hematochezia, but a repeat colonoscopy is warrant with the significant amount of bleeding.  She is hemodynamically stable at this time.  She needs a cardiac work up before pursuing the colonoscopy.  I do not know the source of the bleeding as no diverticula or inflammation was identified on the CT scan.  The rectal examination was negative for any hemorrhoidal etiology and hemorrhoidal bleeding is not associated with lower abdominal pain.  Plan: 1) Await Echo and Tele evaluation. 2) Follow HGB and transfuse as necessary. 3) Colonoscopy once cardiac issues have cleared. 4) Butler [name] to assume care in the AM.    Analleli Gierke D 07/31/2014, 3:14 PM

## 2014-07-31 NOTE — Progress Notes (Signed)
Patient seen and examined. Admitted after midnight secondary to syncope and BRBPR. Patient described some lower abd cramps (bilaterally) and has continued experiencing bloody loose stools now. No CP, no SOB, no palpitations and denies any further dizziness/lightheadedness or syncope episodes. VS stable. Please referred to admission by Dr. Arnoldo Morale for further details/info on admission.  Plan: -will also check 2-D echo, TSH and free T4; continue telemetry evaluation -GI has been contacted -UA/urine cx ordered and pending) -continue clear liquid diet -presentation suggesting lower GIB (diverticulosis, hemorrhoids or ischemic colitis) -will follow Hgb trend  Barton Dubois [phone]

## 2014-07-31 NOTE — Progress Notes (Signed)
Utilization Review Completed.Shaina Gullatt T12/20/2015  

## 2014-08-01 DIAGNOSIS — E039 Hypothyroidism, unspecified: Secondary | ICD-10-CM | POA: Diagnosis present

## 2014-08-01 DIAGNOSIS — I10 Essential (primary) hypertension: Secondary | ICD-10-CM | POA: Diagnosis present

## 2014-08-01 DIAGNOSIS — I379 Nonrheumatic pulmonary valve disorder, unspecified: Secondary | ICD-10-CM

## 2014-08-01 DIAGNOSIS — I5032 Chronic diastolic (congestive) heart failure: Secondary | ICD-10-CM

## 2014-08-01 DIAGNOSIS — I4510 Unspecified right bundle-branch block: Secondary | ICD-10-CM

## 2014-08-01 LAB — BASIC METABOLIC PANEL
Anion gap: 8 (ref 5–15)
BUN: 6 mg/dL (ref 6–23)
CHLORIDE: 110 meq/L (ref 96–112)
CO2: 24 mEq/L (ref 19–32)
CREATININE: 0.76 mg/dL (ref 0.50–1.10)
Calcium: 8.1 mg/dL — ABNORMAL LOW (ref 8.4–10.5)
GFR calc non Af Amer: 78 mL/min — ABNORMAL LOW (ref >90)
Glucose, Bld: 97 mg/dL (ref 70–99)
Potassium: 3.6 mEq/L — ABNORMAL LOW (ref 3.7–5.3)
Sodium: 142 mEq/L (ref 137–147)

## 2014-08-01 LAB — HEMOGLOBIN AND HEMATOCRIT, BLOOD
HCT: 29.6 % — ABNORMAL LOW (ref 36.0–46.0)
HCT: 31.6 % — ABNORMAL LOW (ref 36.0–46.0)
HEMOGLOBIN: 9.6 g/dL — AB (ref 12.0–15.0)
Hemoglobin: 10.5 g/dL — ABNORMAL LOW (ref 12.0–15.0)

## 2014-08-01 LAB — PRO B NATRIURETIC PEPTIDE: PRO B NATRI PEPTIDE: 274.8 pg/mL (ref 0–450)

## 2014-08-01 MED ORDER — LIVING BETTER WITH HEART FAILURE BOOK
Freq: Once | Status: DC
  Filled 2014-08-01: qty 1

## 2014-08-01 NOTE — Progress Notes (Signed)
Progress Note   Subjective  still passing some bloody loose stool (old appearing blood per nurse) though volume less. Still having abdominal pain with defecating and when eating   Objective   Vital signs in last 24 hours: Temp:  [98.1 F (36.7 C)-99.6 F (37.6 C)] 98.6 F (37 C) (12/21 0349) Pulse Rate:  [75-84] 75 (12/21 0349) Resp:  [16-18] 18 (12/21 0349) BP: (123-142)/(38-48) 132/38 mmHg (12/21 0349) SpO2:  [97 %-98 %] 98 % (12/21 0349) Last BM Date: 08/01/14 General:    white female in NAD Heart:  Regular rate and rhythm Abdomen:  Soft,  Nondistended, mild diffuse tenderness Extremities:  Without edema. Neurologic:  Alert and oriented,  grossly normal neurologically. Psych:  Cooperative. Normal mood and affect.  Lab Results:  Recent Labs  07/30/14 1813 07/31/14 0321 07/31/14 1300 07/31/14 2120 08/01/14 0550  WBC 13.2* 10.0  --   --   --   HGB 11.7* 11.2* 10.2* 10.1* 9.6*  HCT 35.7* 33.5* 31.7* 30.4* 29.6*  PLT 247 221  --   --   --    BMET  Recent Labs  07/30/14 1813 07/31/14 0321 08/01/14 0550  NA 137 142 142  K 4.0 3.7 3.6*  CL 97 104 110  CO2 28 25 24   GLUCOSE 192* 112* 97  BUN 23 17 6   CREATININE 0.99 0.90 0.76  CALCIUM 9.6 8.8 8.1*  Studies/Results:  Ct Abdomen Pelvis W Contrast  07/30/2014   CLINICAL DATA:  Acute onset of syncope today. Concern for abdominal injury. Initial encounter.  EXAM: CT ABDOMEN AND PELVIS WITH CONTRAST  TECHNIQUE: Multidetector CT imaging of the abdomen and pelvis was performed using the standard protocol following bolus administration of intravenous contrast.  CONTRAST:  18mL OMNIPAQUE IOHEXOL 300 MG/ML  SOLN  COMPARISON:  None.  FINDINGS: Mild right basilar atelectasis or scarring is noted.  The liver and spleen are unremarkable in appearance. The gallbladder is within normal limits. The pancreas and adrenal glands are unremarkable.  The kidneys are unremarkable in appearance. There is no evidence of hydronephrosis.  No renal or ureteral stones are seen. No perinephric stranding is appreciated.  No free fluid is identified. The small bowel is unremarkable in appearance. The stomach is within normal limits. No acute vascular abnormalities are seen. Scattered calcification is seen along the abdominal aorta and its branches.  The patient is status post appendectomy. The colon is largely decompressed. An apparent 1.9 cm wall lipoma is suggested along the distal transverse colon. Slight apparent colonic wall thickening along the transverse, descending and sigmoid colon is thought to reflect relative decompression.  The bladder is moderately distended and grossly unremarkable. The uterus is within normal limits. The ovaries are relatively symmetric. No suspicious adnexal masses are seen. No inguinal lymphadenopathy is seen.  No acute osseous abnormalities are identified.  IMPRESSION: 1. No acute abnormality seen within the abdomen or pelvis. 2. Scattered calcification along the abdominal aorta and its branches. 3. Apparent 1.9 cm wall lipoma suggested along the distal transverse colon. Slight apparent colonic wall thickening is thought to reflect relative decompression. 4. Mild right basilar atelectasis or scarring noted.   Electronically Signed   By: Garald Balding M.D.   On: 07/30/2014 22:15     Assessment / Plan:     80 year old female admitted with syncope / hematochezia of unclear etiology. Last colonoscopy was 2010. Hemoglobin down only 3 grams (after IVF) so not clear that syncopal episode related to bleeding. Cardiac workup  in progress, echo results pending. Following cardiac evaluation patient will need a colonoscopy for further evaluation.        LOS: 2 days   Tye Savoy  08/01/2014, 9:11 AM   GI Attending Note  I have personally taken an interval history, reviewed the chart, and examined the patient.  No further active bleeding.  Colonoscopy on hold pending cardiac workup.  Sandy Salaam. Deatra Ina, MD,  [facility] [phone]

## 2014-08-01 NOTE — Progress Notes (Signed)
PROGRESS NOTE  Regina [name] NTI:[id number] DOB: 05-22-1933 DOA: 07/30/2014 PCP: [name], MD  HPI/Recap of past 73 hours: 80 year old female with past medical history of hypertension and hypothyroidism presented to the emergency room on the night of 12/19 after syncopal event. Patient previously had been getting lightheaded.  In the emergency room, patient had a large bloody bowel movement. Hemoglobin on admission was 11.7.    Patient monitored closely and with IV fluids, hemoglobin has come down to as low as 9.6 over the course of 30 hours, but on its on came up to 10.5 6 hours later. Prior to GI workup, patient needed cardiac clearance for syncopal event and underwent echocardiogram which where she was noted to have grade 1 diastolic dysfunction, new diagnosis.  Patient seen today and doing okay. She complains of some mild weakness, but otherwise no complaints. No further active bleeding  Assessment/Plan: Principal Problem:   Anemia due to other cause with rectal/Lower GI bleeding: Suspect diverticular bleeding. Hemoglobin stable. Continue clear liquids, GI can proceed with evaluation. Active Problems:   Syncope: Suspect vasovagal, possibly from diverticular bleed. Echo unrevealing, but we'll check BNP to ensure that she is not in massive heart failure.    Hypertension: Continue Vasotec and Lasix   GERD (gastroesophageal reflux disease)  Hypothyroidism: Continue Synthroid  Right bundle branch block: Noted on admission EKG. If patient is in active failure, will consult cardiology inpatient, otherwise outpatient follow-up    Chronic diastolic heart failure: New diagnosis. Checking BNP. Patient already on ACE inhibitor and diuretic. Will add Coreg on discharge   Code Status: Full code  Family Communication: Husband at the bedside  Disposition Plan: Likely here for next one-2 days   Consultants:  Gastroenterology  Procedures:  2-D echo done 86/76: Grade 1 diastolic  dysfunction  Antibiotics:  None   Objective: BP 144/50 mmHg  Pulse 77  Temp(Src) 98.3 F (36.8 C) (Oral)  Resp 16  Ht 5\' 7"  (1.702 m)  Wt 65.454 kg (144 lb 4.8 oz)  BMI 22.60 kg/m2  SpO2 99% No intake or output data in the 24 hours ending 08/01/14 1703 Filed Weights   07/30/14 1805 07/31/14 0100  Weight: 65.772 kg (145 lb) 65.454 kg (144 lb 4.8 oz)    Exam:   General:  Alert and oriented 3, no acute distress  Cardiovascular: Regular rate and rhythm, S1-S2  Respiratory: Clear to auscultation bilaterally  Abdomen: Soft, nontender, nondistended, positive bowel sounds  Musculoskeletal: No clubbing or cyanosis or edema   Data Reviewed: Basic Metabolic Panel:  Recent Labs Lab 07/30/14 1813 07/31/14 0321 08/01/14 0550  NA 137 142 142  K 4.0 3.7 3.6*  CL 97 104 110  CO2 28 25 24   GLUCOSE 192* 112* 97  BUN 23 17 6   CREATININE 0.99 0.90 0.76  CALCIUM 9.6 8.8 8.1*   Liver Function Tests: No results for input(s): AST, ALT, ALKPHOS, BILITOT, PROT, ALBUMIN in the last 168 hours. No results for input(s): LIPASE, AMYLASE in the last 168 hours. No results for input(s): AMMONIA in the last 168 hours. CBC:  Recent Labs Lab 07/30/14 1813 07/31/14 0321 07/31/14 1300 07/31/14 2120 08/01/14 0550 08/01/14 1251  WBC 13.2* 10.0  --   --   --   --   HGB 11.7* 11.2* 10.2* 10.1* 9.6* 10.5*  HCT 35.7* 33.5* 31.7* 30.4* 29.6* 31.6*  MCV 93.7 95.2  --   --   --   --   PLT 247 221  --   --   --   --  Cardiac Enzymes:   No results for input(s): CKTOTAL, CKMB, CKMBINDEX, TROPONINI in the last 168 hours. BNP (last 3 results) No results for input(s): PROBNP in the last 8760 hours. CBG: No results for input(s): GLUCAP in the last 168 hours.  No results found for this or any previous visit (from the past 240 hour(s)).   Studies: No results found.  Scheduled Meds: . enalapril  5 mg Oral QHS  . fluticasone  1 spray Each Nare Daily  . levothyroxine  88 mcg Oral QAC  breakfast  . pantoprazole (PROTONIX) IV  40 mg Intravenous Q12H  . sodium chloride  3 mL Intravenous Q12H    Continuous Infusions: . sodium chloride 75 mL/hr at 08/01/14 0256     Time spent: 25 minutes  [facility] Pager [phone]. If 7PM-7AM, please contact night-coverage at [email or web address], password [email or web address] 08/01/2014, 5:03 PM  LOS: 2 days

## 2014-08-01 NOTE — Progress Notes (Signed)
Echocardiogram 2D Echocardiogram has been performed.  Regina [name] 08/01/2014, 2:53 PM

## 2014-08-02 DIAGNOSIS — L97929 Non-pressure chronic ulcer of unspecified part of left lower leg with unspecified severity: Secondary | ICD-10-CM

## 2014-08-02 DIAGNOSIS — L97909 Non-pressure chronic ulcer of unspecified part of unspecified lower leg with unspecified severity: Secondary | ICD-10-CM | POA: Diagnosis present

## 2014-08-02 LAB — BASIC METABOLIC PANEL
ANION GAP: 2 — AB (ref 5–15)
BUN: <5 mg/dL — ABNORMAL LOW (ref 6–23)
CHLORIDE: 109 meq/L (ref 96–112)
CO2: 27 mmol/L (ref 19–32)
CREATININE: 0.84 mg/dL (ref 0.50–1.10)
Calcium: 8.1 mg/dL — ABNORMAL LOW (ref 8.4–10.5)
GFR calc Af Amer: 75 mL/min — ABNORMAL LOW (ref >90)
GFR calc non Af Amer: 64 mL/min — ABNORMAL LOW (ref >90)
Glucose, Bld: 100 mg/dL — ABNORMAL HIGH (ref 70–99)
POTASSIUM: 3.7 mmol/L (ref 3.5–5.1)
Sodium: 138 mmol/L (ref 135–145)

## 2014-08-02 LAB — CBC
HCT: 29.7 % — ABNORMAL LOW (ref 36.0–46.0)
HEMOGLOBIN: 9.9 g/dL — AB (ref 12.0–15.0)
MCH: 32.1 pg (ref 26.0–34.0)
MCHC: 33.3 g/dL (ref 30.0–36.0)
MCV: 96.4 fL (ref 78.0–100.0)
Platelets: 191 10*3/uL (ref 150–400)
RBC: 3.08 MIL/uL — AB (ref 3.87–5.11)
RDW: 12.4 % (ref 11.5–15.5)
WBC: 10.3 10*3/uL (ref 4.0–10.5)

## 2014-08-02 MED ORDER — PEG-KCL-NACL-NASULF-NA ASC-C 100 G PO SOLR: 1.0000 | Freq: Once | ORAL | Status: DC

## 2014-08-02 MED ORDER — PEG-KCL-NACL-NASULF-NA ASC-C 100 G PO SOLR
0.5000 | Freq: Once | ORAL | Status: AC
  Administered 2014-08-02: 100 g via ORAL
  Filled 2014-08-02: qty 1

## 2014-08-02 MED ORDER — PEG-KCL-NACL-NASULF-NA ASC-C 100 G PO SOLR
0.5000 | Freq: Once | ORAL | Status: DC
  Filled 2014-08-02: qty 1

## 2014-08-02 MED ORDER — PEG-KCL-NACL-NASULF-NA ASC-C 100 G PO SOLR
1.0000 | Freq: Once | ORAL | Status: DC
  Filled 2014-08-02: qty 1

## 2014-08-02 MED ORDER — PEG-KCL-NACL-NASULF-NA ASC-C 100 G PO SOLR
0.5000 | Freq: Once | ORAL | Status: AC
  Administered 2014-08-03: 100 g via ORAL

## 2014-08-02 NOTE — Progress Notes (Addendum)
PROGRESS NOTE  Regina [name] BOF:[id number] DOB: August 26, 1932 DOA: 07/30/2014 PCP: [name], MD  HPI/Recap of past 46 hours: 80 year old female with past medical history of hypertension and hypothyroidism presented to the emergency room on the night of 12/19 after syncopal event. Patient previously had been getting lightheaded.  In the emergency room, patient had a large bloody bowel movement. Hemoglobin on admission was 11.7.    Patient monitored closely and with IV fluids, hemoglobin has come down to as low as 9.6 within the first 30 hours, but since then has gone up and down and currently she is not felt to be bleeding. Seen by GI that she is cleared  P from a cardiac standpoint, with plans for colonoscopy tomorrow.rior to GI workup, patient needed cardiac clearance for syncopal event and underwent echocardiogram which where she was noted to have grade 1 diastolic dysfunction, new diagnosis, however BNP normal.  Patient seen today and doing okay. Complains of some chronic ulcers on her lower extremities, which are somewhat tender, but not draining.  Assessment/Plan: Principal Problem:   Anemia due to other cause with rectal/Lower GI bleeding: Suspect diverticular bleeding. Hemoglobin stable. Continue clear liquids,  colonoscopy tomorrow. If hemoglobin stable and workup unrevealing, likely can go home tomorrowActive Problems:   Syncope: Suspect vasovagal, possibly from diverticular bleed. Echo unrevealing,     Hypertension: Continue Vasotec and Lasix   GERD (gastroesophageal reflux disease)  Hypothyroidism: Continue Synthroid  Right bundle branch block:  outpatient follow-up with cardiology as    Chronic diastolic heart failure: New diagnosis.  BNP normal. Providing education.Patient already on ACE inhibitor and diuretic. Will add Coreg on discharge  Lower extremity ulcers: Have asked wound care to evaluate  Code Status: Full code  Family Communication: Husband at the  bedside  Disposition Plan:  if colonoscopy unrevealing, likely home tomorrow  Consultants:  Gastroenterology  Procedures:  2-D echo done 77/82: Grade 1 diastolic dysfunction  Antibiotics:  None   Objective: BP 114/40 mmHg  Pulse 77  Temp(Src) 99.2 F (37.3 C) (Oral)  Resp 16  Ht _0  (1.702 m)  Wt 65.454 kg (144 lb 4.8 oz)  BMI 22.60 kg/m2  SpO2 96%  Intake/Output Summary (Last 24 hours) at 08/02/14 1009 Last data filed at 08/01/14 1754  Gross per 24 hour  Intake    400 ml  Output      0 ml  Net    400 ml   Filed Weights   07/30/14 1805 07/31/14 0100  Weight: 65.772 kg (145 lb) 65.454 kg (144 lb 4.8 oz)    Exam:   General:  Alert and oriented 3, no acute distress  Cardiovascular: Regular rate and rhythm, S1-S2  Respiratory: Clear to auscultation bilaterally  Abdomen: Soft, nontender, nondistended, positive bowel sounds  Musculoskeletal: No clubbing or cyanosis.   border size lesion on left lower extremity, nondraining. Healing laceration with minimal ulceration on posterior aspect on the right lower extremity   Data Reviewed: Basic Metabolic Panel:  Recent Labs Lab 07/30/14 1813 07/31/14 0321 08/01/14 0550 08/02/14 0535  NA 137 142 142 138  K 4.0 3.7 3.6* 3.7  CL 97 104 110 109  CO2 _1 GLUCOSE 192* 112* 97 100*  BUN _2 <5*  CREATININE 0.99 0.90 0.76 0.84  CALCIUM 9.6 8.8 8.1* 8.1*   Liver Function Tests: No results for input(s): AST, ALT, ALKPHOS, BILITOT, PROT, ALBUMIN in the last 168 hours. No results for input(s): LIPASE, AMYLASE in the last  168 hours. No results for input(s): AMMONIA in the last 168 hours. CBC:  Recent Labs Lab 07/30/14 1813 07/31/14 0321 07/31/14 1300 07/31/14 2120 08/01/14 0550 08/01/14 1251 08/02/14 0535  WBC 13.2* 10.0  --   --   --   --  10.3  HGB 11.7* 11.2* 10.2* 10.1* 9.6* 10.5* 9.9*  HCT 35.7* 33.5* 31.7* 30.4* 29.6* 31.6* 29.7*  MCV 93.7 95.2  --   --   --   --  96.4  PLT 247 221   --   --   --   --  191   Cardiac Enzymes:   No results for input(s): CKTOTAL, CKMB, CKMBINDEX, TROPONINI in the last 168 hours. BNP (last 3 results)  Recent Labs  08/01/14 1251  PROBNP 274.8   CBG: No results for input(s): GLUCAP in the last 168 hours.  No results found for this or any previous visit (from the past 240 hour(s)).   Studies: No results found.  Scheduled Meds: . enalapril  5 mg Oral QHS  . fluticasone  1 spray Each Nare Daily  . levothyroxine  88 mcg Oral QAC breakfast  . Living Better with Heart Failure Book   Does not apply Once  . peg 3350 powder  0.5 kit Oral Once   And  . [START ON 08/03/2014] peg 3350 powder  0.5 kit Oral Once  . sodium chloride  3 mL Intravenous Q12H    Continuous Infusions:     Time spent: 25 minutes  [facility] Pager [phone]. If 7PM-7AM, please contact night-coverage at [email or web address], password [facility] 08/02/2014, 10:09 AM  LOS: 3 days

## 2014-08-02 NOTE — Consult Note (Signed)
WOC wound consult note Reason for Consult: evaluation of the LLE wound, non healing. Pt reports about 2 wks ago she suffered a trauma from a the back of her grandson's ice skate and this area has been very slow to heal. She does not have significant edema but she does have a weak pulse in that foot and reports "I have circulation problems".  She reports neuropathy also.  Wound type: non healing traumatic wound  Measurement:2.0cm x 1.0cm x 0.1cm  Wound bed: dry, pink Drainage (amount, consistency, odor) some serosanguinous drainage  Periwound: intact  Dressing procedure/placement/frequency: silver gel applied daily, cover with dry dressing.  Antimicrobial and to add moisture for wound healing.   Explained rationale for use to family, they can continue to use once discharged.   Discussed POC with patient and bedside nurse.  Re consult if needed, will not follow at this time. Thanks  Donye Campanelli [place], [place] [phone])

## 2014-08-02 NOTE — Progress Notes (Signed)
          Daily Rounding Note  08/02/2014, 8:23 AM  LOS: 3 days   SUBJECTIVE:       Dark stool yesterday, old blood in appearance.  Not dizzy with getting up to bathroom.  No pain.  Tolerating clears.   OBJECTIVE:         Vital signs in last 24 hours:    Temp:  [98.3 F (36.8 C)-99.2 F (37.3 C)] 99.2 F (37.3 C) (12/22 0430) Pulse Rate:  [71-77] 77 (12/22 0430) Resp:  [16-18] 16 (12/22 0430) BP: (114-156)/(40-50) 114/40 mmHg (12/22 0430) SpO2:  [96 %-99 %] 96 % (12/22 0430) Last BM Date: 08/01/14 Filed Weights   07/30/14 1805 07/31/14 0100  Weight: 145 lb (65.772 kg) 144 lb 4.8 oz (65.454 kg)   General: pleasant, comfortable.  Looks well.    Heart: RRR Chest: clear bil.  No dyspnea or cough. Abdomen: soft, NT, ND, no mass/bruits.  Active BS  Extremities: no CCE Neuro/Psych:  Pleasant, alert.  Oriented x 3.  No limb weakness.   Intake/Output from previous day: 12/21 0701 - 12/22 0700 In: 400 [P.O.:400] Out: -   Intake/Output this shift:    Lab Results:  Recent Labs  07/30/14 1813 07/31/14 0321  08/01/14 0550 08/01/14 1251 08/02/14 0535  WBC 13.2* 10.0  --   --   --  10.3  HGB 11.7* 11.2*  < > 9.6* 10.5* 9.9*  HCT 35.7* 33.5*  < > 29.6* 31.6* 29.7*  PLT 247 221  --   --   --  191  < > = values in this interval not displayed. BMET  Recent Labs  07/31/14 0321 08/01/14 0550 08/02/14 0535  NA 142 142 138  K 3.7 3.6* 3.7  CL 104 110 109  CO2 25 24 27   GLUCOSE 112* 97 100*  BUN 17 6 <5*  CREATININE 0.90 0.76 0.84  CALCIUM 8.8 8.1* 8.1*   Scheduled Meds: . enalapril  5 mg Oral QHS  . fluticasone  1 spray Each Nare Daily  . levothyroxine  88 mcg Oral QAC breakfast  . Living Better with Heart Failure Book   Does not apply Once  . sodium chloride  3 mL Intravenous Q12H   Continuous Infusions:  PRN Meds:.acetaminophen **OR** acetaminophen, alum & mag hydroxide-simeth, HYDROmorphone (DILAUDID)  injection, ondansetron **OR** ondansetron (ZOFRAN) IV, oxyCODONE, polyvinyl alcohol   ASSESMENT:   *  Hematochezia.  02/2013 EGD: Barrett's esophagus, [place] 2010 colonoscopy:  Anastomosis @ 20 cm from anal verge.   *  Syncope in setting of LGIB.  *  ABL anemia.  Hgb nadir 9.6. No PRBCs todate.   *  Diastolic dysfunction, grade 1, per Echo 12/21. Marland Kitchen   PLAN   *  Colonoscopy set for 1330 tomorrow. Split dose movi prep.     Azucena Freed  08/02/2014, 8:23 AM Pager: [phone]  GI Attending Note  I have personally taken an interval history, reviewed the chart, and examined the patient.  I agree with the extender's note, impression and recommendations.  Sandy Salaam. Deatra Ina, MD, [facility] [phone]

## 2014-08-02 NOTE — Progress Notes (Signed)
Medicare Important Message given? YES  (If response is "NO", the following Medicare IM given date fields will be blank)  Date Medicare IM given: 08/02/14 Medicare IM given by:  Genasis Zingale  

## 2014-08-03 ENCOUNTER — Encounter (HOSPITAL_COMMUNITY): Payer: Self-pay | Admitting: Anesthesiology

## 2014-08-03 ENCOUNTER — Encounter (HOSPITAL_COMMUNITY)
Admission: EM | Disposition: A | Discharge status: Home / Self Care | Payer: Self-pay | Source: Home / Self Care | Attending: Internal Medicine

## 2014-08-03 ENCOUNTER — Encounter (HOSPITAL_COMMUNITY): Payer: Self-pay

## 2014-08-03 DIAGNOSIS — K648 Other hemorrhoids: Secondary | ICD-10-CM

## 2014-08-03 HISTORY — PX: COLONOSCOPY: SHX5424

## 2014-08-03 LAB — CBC
HEMATOCRIT: 29.6 % — AB (ref 36.0–46.0)
Hemoglobin: 9.6 g/dL — ABNORMAL LOW (ref 12.0–15.0)
MCH: 30.2 pg (ref 26.0–34.0)
MCHC: 32.4 g/dL (ref 30.0–36.0)
MCV: 93.1 fL (ref 78.0–100.0)
PLATELETS: 226 10*3/uL (ref 150–400)
RBC: 3.18 MIL/uL — ABNORMAL LOW (ref 3.87–5.11)
RDW: 12.1 % (ref 11.5–15.5)
WBC: 7.8 10*3/uL (ref 4.0–10.5)

## 2014-08-03 SURGERY — COLONOSCOPY
Anesthesia: Monitor Anesthesia Care

## 2014-08-03 MED ORDER — FENTANYL CITRATE 0.05 MG/ML IJ SOLN
INTRAMUSCULAR | Status: AC
  Filled 2014-08-03: qty 2

## 2014-08-03 MED ORDER — MIDAZOLAM HCL 5 MG/5ML IJ SOLN
INTRAMUSCULAR | Status: DC | PRN
  Administered 2014-08-03 (×3): 2 mg via INTRAVENOUS

## 2014-08-03 MED ORDER — MIDAZOLAM HCL 5 MG/ML IJ SOLN
INTRAMUSCULAR | Status: AC
  Filled 2014-08-03: qty 3

## 2014-08-03 MED ORDER — FENTANYL CITRATE 0.05 MG/ML IJ SOLN
INTRAMUSCULAR | Status: DC | PRN
  Administered 2014-08-03: 25 ug via INTRAVENOUS
  Administered 2014-08-03: 12.5 ug via INTRAVENOUS
  Administered 2014-08-03: 25 ug via INTRAVENOUS
  Administered 2014-08-03: 12.5 ug via INTRAVENOUS

## 2014-08-03 MED ORDER — DIPHENHYDRAMINE HCL 50 MG/ML IJ SOLN
INTRAMUSCULAR | Status: AC
  Filled 2014-08-03: qty 1

## 2014-08-03 MED ORDER — SODIUM CHLORIDE 0.9 % IV SOLN
INTRAVENOUS | Status: DC
  Administered 2014-08-03: 10:00:00 via INTRAVENOUS

## 2014-08-03 MED ORDER — CARVEDILOL 3.125 MG PO TABS: 3.1250 mg | ORAL_TABLET | Freq: Two times a day (BID) | ORAL | Status: DC

## 2014-08-03 MED ORDER — LIVING BETTER WITH HEART FAILURE BOOK: Freq: Once | Status: DC

## 2014-08-03 MED ORDER — LACTATED RINGERS IV SOLN
INTRAVENOUS | Status: DC
Start: 2014-08-03 — End: 2014-08-03
  Administered 2014-08-03: 1000 mL via INTRAVENOUS

## 2014-08-03 NOTE — H&P (View-Only) (Signed)
          Daily Rounding Note  08/02/2014, 8:23 AM  LOS: 3 days   SUBJECTIVE:       Dark stool yesterday, old blood in appearance.  Not dizzy with getting up to bathroom.  No pain.  Tolerating clears.   OBJECTIVE:         Vital signs in last 24 hours:    Temp:  [98.3 F (36.8 C)-99.2 F (37.3 C)] 99.2 F (37.3 C) (12/22 0430) Pulse Rate:  [71-77] 77 (12/22 0430) Resp:  [16-18] 16 (12/22 0430) BP: (114-156)/(40-50) 114/40 mmHg (12/22 0430) SpO2:  [96 %-99 %] 96 % (12/22 0430) Last BM Date: 08/01/14 Filed Weights   07/30/14 1805 07/31/14 0100  Weight: 145 lb (65.772 kg) 144 lb 4.8 oz (65.454 kg)   General: pleasant, comfortable.  Looks well.    Heart: RRR Chest: clear bil.  No dyspnea or cough. Abdomen: soft, NT, ND, no mass/bruits.  Active BS  Extremities: no CCE Neuro/Psych:  Pleasant, alert.  Oriented x 3.  No limb weakness.   Intake/Output from previous day: 12/21 0701 - 12/22 0700 In: 400 [P.O.:400] Out: -   Intake/Output this shift:    Lab Results:  Recent Labs  07/30/14 1813 07/31/14 0321  08/01/14 0550 08/01/14 1251 08/02/14 0535  WBC 13.2* 10.0  --   --   --  10.3  HGB 11.7* 11.2*  < > 9.6* 10.5* 9.9*  HCT 35.7* 33.5*  < > 29.6* 31.6* 29.7*  PLT 247 221  --   --   --  191  < > = values in this interval not displayed. BMET  Recent Labs  07/31/14 0321 08/01/14 0550 08/02/14 0535  NA 142 142 138  K 3.7 3.6* 3.7  CL 104 110 109  CO2 25 24 27   GLUCOSE 112* 97 100*  BUN 17 6 <5*  CREATININE 0.90 0.76 0.84  CALCIUM 8.8 8.1* 8.1*   Scheduled Meds: . enalapril  5 mg Oral QHS  . fluticasone  1 spray Each Nare Daily  . levothyroxine  88 mcg Oral QAC breakfast  . Living Better with Heart Failure Book   Does not apply Once  . sodium chloride  3 mL Intravenous Q12H   Continuous Infusions:  PRN Meds:.acetaminophen **OR** acetaminophen, alum & mag hydroxide-simeth, HYDROmorphone (DILAUDID)  injection, ondansetron **OR** ondansetron (ZOFRAN) IV, oxyCODONE, polyvinyl alcohol   ASSESMENT:   *  Hematochezia.  02/2013 EGD: Barrett's esophagus, [place] 2010 colonoscopy:  Anastomosis @ 20 cm from anal verge.   *  Syncope in setting of LGIB.  *  ABL anemia.  Hgb nadir 9.6. No PRBCs todate.   *  Diastolic dysfunction, grade 1, per Echo 12/21. Marland Kitchen   PLAN   *  Colonoscopy set for 1330 tomorrow. Split dose movi prep.     Azucena Freed  08/02/2014, 8:23 AM Pager: [phone]  GI Attending Note  I have personally taken an interval history, reviewed the chart, and examined the patient.  I agree with the extender's note, impression and recommendations.  Sandy Salaam. Deatra Ina, MD, [facility] [phone]

## 2014-08-03 NOTE — Progress Notes (Signed)
Colonoscopy was entirely normal except for internal hemorrhoids.  This was probably the source for limited bleeding.  Recommendations-no further GI workup  Signing off

## 2014-08-03 NOTE — Discharge Summary (Signed)
Discharge Summary  Regina [name] XIP:[id number] DOB: 07-Sep-1932  PCP: [name], MD  Admit date: 07/30/2014 Discharge date: 08/03/2014  Time spent: 25 minutes  Recommendations for Outpatient Follow-up:  1. New medication: Coreg 3.125 by mouth twice a day 2. Patient will follow-up with [facility] heart care in regards to right bundle branch block finding on EKG and new diagnosis of diastolic heart failure   Discharge Diagnoses:  Active Hospital Problems   Diagnosis Date Noted  . Lower GI bleeding   . Internal hemorrhoids with complication 67/34/1937  . Ulcer of lower limb 08/02/2014  . Chronic diastolic heart failure 90/24/0973  . Right bundle branch block 08/01/2014  . Essential hypertension 08/01/2014  . Hypothyroidism 08/01/2014  . Rectal bleeding 07/31/2014  . Hypertension 07/31/2014  . GERD (gastroesophageal reflux disease) 07/31/2014  . Anemia due to other cause 07/31/2014  . Faintness   . Syncope 07/30/2014    Resolved Hospital Problems   Diagnosis Date Noted Date Resolved  No resolved problems to display.    Discharge Condition: Improved, being discharged home  Diet recommendation: Heart healthy  Filed Weights   07/30/14 1805 07/31/14 0100 08/03/14 1310  Weight: 65.772 kg (145 lb) 65.454 kg (144 lb 4.8 oz) 65.318 kg (144 lb)    History of present illness:  80 year old female with past medical history of hypertension and hypothyroidism presented to the emergency room on the night of 12/19 after syncopal event. Patient previously had been getting lightheaded. In the emergency room, patient had a large bloody bowel movement. Hemoglobin on admission was 11.7.   Hospital Course:  Principal Problem:   Lower GI bleeding/anemia/rectal bleeding/internal hemorrhoids with complication: Patient was continued on clear liquids. When she was cleared from a heart standpoint, she Colonoscopy in 12/23 which only noted hemorrhoids. Hemoglobin got to as low as 9.9 and was up and  down from that. Active Problems:   Syncope: Suspected vasovagal. Check echo for completeness sake. Patient's echo was unrevealing for any valvular abnormalities.    Hypertension: As mentioned below, have added low-dose beta blocker   GERD (gastroesophageal reflux disease): Stable on PPI    Chronic diastolic heart failure: Noted on echocardiogram. Stable. BNP normal. Patient already on ACE inhibitor. Added low-dose beta blocker   Right bundle branch block: Incidentally noted on EKG, patient will follow up with outpatient cardiology    Hypothyroidism: Stable, continue on Synthroid   Ulcer of lower limb: Patient noted to have lower extremity ulcers which are slow to heal. Wound care saw patient and added antimicrobial plus dry dressing.    Procedures:  2-D echo done 53/29: Grade 1 diastolic dysfunction  Colonoscopy done 12/23: Internal hemorrhoids  Consultations:  Gastroenterology  Discharge Exam: BP 120/39 mmHg  Pulse 68  Temp(Src) 98.2 F (36.8 C) (Oral)  Resp 18  Ht 5\' 7"  (1.702 m)  Wt 65.318 kg (144 lb)  BMI 22.55 kg/m2  SpO2 98%  General: Alert and oriented 3, no acute distress Cardiovascular: Regular rate and rhythm, S1-S2 Respiratory: Clear to auscultation bilaterally  Discharge Instructions You were cared for by a hospitalist during your hospital stay. If you have any questions about your discharge medications or the care you received while you were in the hospital after you are discharged, you can call the unit and asked to speak with the hospitalist on call if the hospitalist that took care of you is not available. Once you are discharged, your primary care physician will handle any further medical issues. Please note that NO REFILLS for  any discharge medications will be authorized once you are discharged, as it is imperative that you return to your primary care physician (or establish a relationship with a primary care physician if you do not have one) for your  aftercare needs so that they can reassess your need for medications and monitor your lab values.     Medication List    TAKE these medications        acetaminophen 650 MG CR tablet  Commonly known as:  TYLENOL  Take 650 mg by mouth daily as needed for pain.     carvedilol 3.125 MG tablet  Commonly known as:  COREG  Take 1 tablet (3.125 mg total) by mouth 2 (two) times daily with a meal.     clotrimazole-betamethasone cream  Commonly known as:  LOTRISONE  Apply 1 application topically 2 (two) times daily as needed (itching/burning/swelling).     enalapril 5 MG tablet  Commonly known as:  VASOTEC  Take 5 mg by mouth at bedtime.     esomeprazole 40 MG capsule  Commonly known as:  NEXIUM  Take 40 mg by mouth daily as needed (heartburn).     furosemide 40 MG tablet  Commonly known as:  LASIX  Take 40 mg by mouth daily.     hydrocortisone 25 MG suppository  Commonly known as:  ANUSOL-HC  Place 1 suppository (25 mg total) rectally at bedtime.     hydrocortisone-pramoxine 2.5-1 % rectal cream  Commonly known as:  ANALPRAM HC  Place rectally 3 (three) times daily.     ibuprofen 200 MG tablet  Commonly known as:  ADVIL,MOTRIN  Take 200 mg by mouth every 8 (eight) hours as needed (pain).     levocetirizine 5 MG tablet  Commonly known as:  XYZAL  Take 5 mg by mouth daily as needed for allergies.     levothyroxine 88 MCG tablet  Commonly known as:  SYNTHROID, LEVOTHROID  Take 88 mcg by mouth daily before breakfast.     PRESCRIPTION MEDICATION  Take 35 mg by mouth daily as needed (pain). Arthritis medication - samples from Dr. Beatriz Chancellor OP  Place 1 drop into both eyes daily as needed (dry eyes).     triamcinolone 55 MCG/ACT nasal inhaler  Commonly known as:  NASACORT  Place 2 sprays into both nostrils daily as needed (congestion/allergies).     trolamine salicylate 10 % cream  Commonly known as:  ASPERCREME  Apply 1 application topically daily as needed  for muscle pain.     ZORVOLEX 35 MG Caps  Generic drug:  Diclofenac  Take 1 capsule by mouth as needed (arthritis).       Allergies  Allergen Reactions  . Penicillins Hives and Itching       Follow-up Information    Follow up with [name], MD In 1 month.   Specialty:  Internal Medicine   Why:  As needed   Contact information:   [id number] [address] [phone]        The results of significant diagnostics from this hospitalization (including imaging, microbiology, ancillary and laboratory) are listed below for reference.    Significant Diagnostic Studies: Dg Chest 2 View  07/30/2014   CLINICAL DATA:  Acute onset of diarrhea and hematemesis. Loss of consciousness. Chills. Initial encounter.  EXAM: CHEST  2 VIEW  COMPARISON:  Chest radiograph performed 04/22/2011  FINDINGS: The lungs are hyperexpanded, with flattening of the hemidiaphragms, compatible with COPD.  Mild chronic peribronchial thickening is noted. There is no evidence of focal opacification, pleural effusion or pneumothorax.  The heart is normal in size; the mediastinal contour is within normal limits. No acute osseous abnormalities are seen.  IMPRESSION: Findings of COPD; no acute cardiopulmonary process seen.   Electronically Signed   By: Garald Balding M.D.   On: 07/30/2014 22:36   Ct Abdomen Pelvis W Contrast  07/30/2014    IMPRESSION: 1. No acute abnormality seen within the abdomen or pelvis. 2. Scattered calcification along the abdominal aorta and its branches. 3. Apparent 1.9 cm wall lipoma suggested along the distal transverse colon. Slight apparent colonic wall thickening is thought to reflect relative decompression. 4. Mild right basilar atelectasis or scarring noted.   Electronically Signed   By: Garald Balding M.D.   On: 07/30/2014 22:15    Microbiology: No results found for this or any previous visit (from the past 240 hour(s)).   Labs: Basic Metabolic Panel:  Recent Labs Lab  07/30/14 1813 07/31/14 0321 08/01/14 0550 08/02/14 0535  NA 137 142 142 138  K 4.0 3.7 3.6* 3.7  CL 97 104 110 109  CO2 28 25 24 27   GLUCOSE 192* 112* 97 100*  BUN 23 17 6  <5*  CREATININE 0.99 0.90 0.76 0.84  CALCIUM 9.6 8.8 8.1* 8.1*   Liver Function Tests: No results for input(s): AST, ALT, ALKPHOS, BILITOT, PROT, ALBUMIN in the last 168 hours. No results for input(s): LIPASE, AMYLASE in the last 168 hours. No results for input(s): AMMONIA in the last 168 hours. CBC:  Recent Labs Lab 07/30/14 1813 07/31/14 0321  07/31/14 2120 08/01/14 0550 08/01/14 1251 08/02/14 0535 08/03/14 0351  WBC 13.2* 10.0  --   --   --   --  10.3 7.8  HGB 11.7* 11.2*  < > 10.1* 9.6* 10.5* 9.9* 9.6*  HCT 35.7* 33.5*  < > 30.4* 29.6* 31.6* 29.7* 29.6*  MCV 93.7 95.2  --   --   --   --  96.4 93.1  PLT 247 221  --   --   --   --  191 226  < > = values in this interval not displayed. Cardiac Enzymes: No results for input(s): CKTOTAL, CKMB, CKMBINDEX, TROPONINI in the last 168 hours. BNP: BNP (last 3 results)  Recent Labs  08/01/14 1251  PROBNP 274.8   CBG: No results for input(s): GLUCAP in the last 168 hours.     Signed:  Annita Brod  [facility] 08/03/2014, 3:51 PM

## 2014-08-03 NOTE — Interval H&P Note (Signed)
History and Physical Interval Note:  08/03/2014 2:22 PM  Regina [name]  has presented today for surgery, with the diagnosis of hematochezia, anemia.  The various methods of treatment have been discussed with the patient and family. After consideration of risks, benefits and other options for treatment, the patient has consented to  Procedure(s): COLONOSCOPY (N/A) as a surgical intervention .  The patient's history has been reviewed, patient examined, no change in status, stable for surgery.  I have reviewed the patient's chart and labs.  Questions were answered to the patient's satisfaction.    The recent H&P (dated *08/02/14**) was reviewed, the patient was examined and there is no change in the patients condition since that H&P was completed.   Erskine Emery  08/03/2014, 2:23 PM    Erskine Emery

## 2014-08-03 NOTE — Progress Notes (Signed)
Pt discharged home  Discharge instructions given & reviewed Eduction discussed  IV dc'd  Pt discharged via wheelchair with RN All pt belongs at side. Regina [name] 6:37 PM

## 2014-08-03 NOTE — Progress Notes (Signed)
          Daily Rounding Note  08/03/2014, 10:41 AM  LOS: 4 days   SUBJECTIVE:       No problems with bowel prep.  Stools clear.   OBJECTIVE:         Vital signs in last 24 hours:    Temp:  [98.5 F (36.9 C)-98.7 F (37.1 C)] 98.5 F (36.9 C) (12/22 2001) Pulse Rate:  [68-84] 79 (12/23 0039) Resp:  [18] 18 (12/22 2001) BP: (117-178)/(44-63) 127/44 mmHg (12/23 0039) SpO2:  [97 %-100 %] 100 % (12/22 2001) Last BM Date: 08/03/14 Filed Weights   07/30/14 1805 07/31/14 0100  Weight: 145 lb (65.772 kg) 144 lb 4.8 oz (65.454 kg)   General:   Looks well,  Did not reexamine .  No labored breathing   Intake/Output from previous day: 12/22 0701 - 12/23 0700 In: 720 [P.O.:720] Out: -   Intake/Output this shift:    Lab Results:  Recent Labs  08/01/14 1251 08/02/14 0535 08/03/14 0351  WBC  --  10.3 7.8  HGB 10.5* 9.9* 9.6*  HCT 31.6* 29.7* 29.6*  PLT  --  191 226   BMET  Recent Labs  08/01/14 0550 08/02/14 0535  NA 142 138  K 3.6* 3.7  CL 110 109  CO2 24 27  GLUCOSE 97 100*  BUN 6 <5*  CREATININE 0.76 0.84  CALCIUM 8.1* 8.1*   LFT No results for input(s): PROT, ALBUMIN, AST, ALT, ALKPHOS, BILITOT, BILIDIR, IBILI in the last 72 hours. PT/INR No results for input(s): LABPROT, INR in the last 72 hours. Hepatitis Panel No results for input(s): HEPBSAG, HCVAB, HEPAIGM, HEPBIGM in the last 72 hours.  Studies/Results: No results found.  ASSESMENT:   * Hematochezia.  02/2013 EGD: Barrett's esophagus, [place] 2010 colonoscopy: Anastomosis @ 20 cm from anal verge.   * Syncope in setting of LGIB.  * ABL anemia. Hgb nadir 9.6. No PRBCs todate.   * Diastolic dysfunction, grade 1, per Echo 12/21. Marland Kitchen     PLAN   *  Colonoscopy today.     Azucena Freed  08/03/2014, 10:41 AM Pager: [phone]

## 2014-08-03 NOTE — Op Note (Signed)
[facility] [place], [address]   COLONOSCOPY PROCEDURE REPORT     EXAM DATE: 08/03/2014  PATIENT NAME:      Regina [name], Regina [name]           MR #:      [id number] BIRTHDATE:       07/03/1933      VISIT #:     [id number]  ATTENDING:     Inda Castle, MD     STATUS:     inpatient REFERRING MD:      [name], M.D. ASA CLASS:        Class II  INDICATIONS:  The patient is a 80 yr old female here for a colonoscopy due to hematochezia. PROCEDURE PERFORMED:     Colonoscopy, diagnostic MEDICATIONS:     Versed 6 mg IV and Fentanyl 75 mcg IV ESTIMATED BLOOD LOSS:     None  CONSENT: The patient understands the risks and benefits of the procedure and understands that these risks include, but are not limited to: sedation, allergic reaction, infection, perforation and/or bleeding. Alternative means of evaluation and treatment include, among others: physical exam, x-rays, and/or surgical intervention. The patient elects to proceed with this endoscopic procedure.  DESCRIPTION OF PROCEDURE: During intra-op preparation period all mechanical & medical equipment was checked for proper function. Hand hygiene and appropriate measures for infection prevention was taken. After the risks, benefits and alternatives of the procedure were thoroughly explained, Informed consent was verified, confirmed and timeout was successfully executed by the treatment team. A digital exam revealed no abnormalities of the rectum.      The Pentax Adult Colon [id number] endoscope was introduced through the anus and advanced to the cecum, which was identified by both the appendix and ileocecal valve. No adverse events experienced. The prep was excellent, using MiraLax. The instrument was then slowly withdrawn as the colon was fully examined.   COLON FINDINGS: Internal hemorrhoids were found.   The examination was otherwise normal. Retroflexed views revealed no  abnormalities. The scope was then completely withdrawn from the patient and the procedure terminated. WITHDRAWAL TIME: 8 minutes 0 seconds    ADVERSE EVENTS:      There were no immediate complications.  IMPRESSIONS:     1.  Internal hemorrhoids 2.  The examination was otherwise normal  GI bleeding may have been hemorrhoidal  RECOMMENDATIONS : expectant management RECALL:  Inda Castle, MD eSigned:  Inda Castle, MD 08/03/2014 2:52 PM   cc:  Delfin Edis, MD  CPT CODES: ICD CODES:  The ICD and CPT codes recommended by this software are interpretations from the data that the clinical staff has captured with the software.  The verification of the translation of this report to the ICD and CPT codes and modifiers is the sole responsibility of the health care institution and practicing physician where this report was generated.  [employer] will not be held responsible for the validity of the ICD and CPT codes included on this report.  AMA assumes no liability for data contained or not contained herein. CPT is a Designer, television/film set of the [employer].   PATIENT NAME:  Regina [name], Regina [name] MR#: [id number]

## 2014-08-04 ENCOUNTER — Encounter (HOSPITAL_COMMUNITY): Payer: Self-pay | Admitting: Gastroenterology

## 2014-08-31 ENCOUNTER — Other Ambulatory Visit: Payer: Self-pay

## 2014-09-06 ENCOUNTER — Encounter: Payer: Self-pay | Admitting: Cardiology

## 2014-09-06 ENCOUNTER — Ambulatory Visit (INDEPENDENT_AMBULATORY_CARE_PROVIDER_SITE_OTHER): Payer: Medicare Other | Admitting: Cardiology

## 2014-09-06 VITALS — BP 194/80 | HR 64 | Ht 67.0 in | Wt 144.4 lb

## 2014-09-06 DIAGNOSIS — I10 Essential (primary) hypertension: Secondary | ICD-10-CM

## 2014-09-06 DIAGNOSIS — R55 Syncope and collapse: Secondary | ICD-10-CM

## 2014-09-06 DIAGNOSIS — I4510 Unspecified right bundle-branch block: Secondary | ICD-10-CM

## 2014-09-06 MED ORDER — CARVEDILOL 6.25 MG PO TABS: 6.2500 mg | ORAL_TABLET | Freq: Two times a day (BID) | ORAL | Status: DC

## 2014-09-06 NOTE — Patient Instructions (Signed)
INCREASE YOUR CARVEDILOL TO 6.25 MG TWICE A DAY   Follow up as needed

## 2014-09-06 NOTE — Progress Notes (Signed)
Regina [name] Date of Birth:  [birth year] [facility] [address] [place], [place]  [id number] [phone]        Fax   [phone]   History of Present Illness: This pleasant 81 year old woman is seen by me for the first time today in the office.  She was recently hospitalized for syncope.  81 year old female with past medical history of hypertension and hypothyroidism presented to the emergency room on the night of 12/19 after syncopal event. Patient previously had been getting lightheaded. In the emergency room, patient had a large bloody bowel movement. Hemoglobin on admission was 11.7.  She has had no further hematochezia.  In the hospital her hemoglobin fell to 9.9.  She had colonoscopy on 12/23 which showed only hemorrhoids. Her cardiac workup included an echocardiogram which showed no valvular abnormalities and there was evidence of diastolic dysfunction.  The echocardiogram on 08/01/14 revealed - Left ventricle: The cavity size was normal. Wall thickness was increased in a pattern of mild LVH. There was mild focal basal hypertrophy of the septum. Systolic function was normal. The estimated ejection fraction was in the range of 55% to 60%. Wall motion was normal; there were no regional wall motion abnormalities. Doppler parameters are consistent with abnormal left ventricular relaxation (grade 1 diastolic dysfunction).  Impressions:  - Normal LV function; grade 1 diastolic dysfunction; trace MR, mild TR.  Dr. Mellody Drown is her primary care physician.  She has a past history of mild hypertension and borderline elevation of her blood sugars.  She has had a history of peripheral edema and tries to avoid dietary salt. Current Outpatient Prescriptions  Medication Sig Dispense Refill  . acetaminophen (TYLENOL) 650 MG CR tablet Take 650 mg by mouth daily as needed for pain.    . carvedilol (COREG) 6.25 MG tablet Take 1 tablet (6.25 mg total) by  mouth 2 (two) times daily with a meal. 60 tablet 1  . clotrimazole-betamethasone (LOTRISONE) cream Apply 1 application topically 2 (two) times daily as needed (itching/burning/swelling).     . enalapril (VASOTEC) 5 MG tablet Take 5 mg by mouth at bedtime.     Marland Kitchen esomeprazole (NEXIUM) 40 MG capsule Take 40 mg by mouth daily as needed (heartburn).     . furosemide (LASIX) 40 MG tablet Take 40 mg by mouth daily.      Marland Kitchen levothyroxine (SYNTHROID, LEVOTHROID) 88 MCG tablet Take 88 mcg by mouth daily before breakfast.    . triamcinolone (NASACORT) 55 MCG/ACT nasal inhaler Place 2 sprays into both nostrils daily as needed (congestion/allergies).     . trolamine salicylate (ASPERCREME) 10 % cream Apply 1 application topically daily as needed for muscle pain.     No current facility-administered medications for this visit.    Allergies  Allergen Reactions  . Penicillins Hives and Itching    Patient Active Problem List   Diagnosis Date Noted  . Internal hemorrhoids with complication 85/46/2703  . Ulcer of lower limb 08/02/2014  . Chronic diastolic heart failure 50/04/3817  . Right bundle branch block 08/01/2014  . Essential hypertension 08/01/2014  . Hypothyroidism 08/01/2014  . Rectal bleeding 07/31/2014  . Hypertension 07/31/2014  . GERD (gastroesophageal reflux disease) 07/31/2014  . Anemia due to other cause 07/31/2014  . Lower GI bleeding   . Faintness   . Syncope 07/30/2014  . Varicose veins of lower extremities with other complications 29/93/7169    History  Smoking status  . Never Smoker   Smokeless  tobacco  . Never Used    History  Alcohol Use No    Family History  Problem Relation Age of Onset  . Heart disease Mother   . Heart disease Father   . Diabetes Sister   . COPD Brother   . Diabetes Brother     Review of Systems: Constitutional: no fever chills diaphoresis or fatigue or change in weight.  Head and neck: no hearing loss, no epistaxis, no photophobia or  visual disturbance. Respiratory: No cough, shortness of breath or wheezing. Cardiovascular: No chest pain peripheral edema, palpitations. Gastrointestinal: No abdominal distention, no abdominal pain, no change in bowel habits hematochezia or melena.  No further hematochezia since previous hospitalization.  Genitourinary: No dysuria, no frequency, no urgency, no nocturia. Musculoskeletal:No arthralgias, no back pain, no gait disturbance or myalgias. Neurological: No dizziness, no headaches, no numbness, no seizures, no syncope, no weakness, no tremors. Hematologic: No lymphadenopathy, no easy bruising. Psychiatric: No confusion, no hallucinations, no sleep disturbance.   Wt Readings from Last 3 Encounters:  09/06/14 144 lb 6.4 oz (65.499 kg)  08/03/14 144 lb (65.318 kg)  07/15/14 151 lb 3 oz (68.578 kg)    Physical Exam: Filed Vitals:   09/06/14 1010  BP: 194/80  Pulse: 64  The patient appears to be in no distress.  Head and neck exam reveals that the pupils are equal and reactive.  The extraocular movements are full.  There is no scleral icterus.  Mouth and pharynx are benign.  No lymphadenopathy.  No carotid bruits.  The jugular venous pressure is normal.  Thyroid is not enlarged or tender.  Old thyroidectomy scar  Chest is clear to percussion and auscultation.  No rales or rhonchi.  Expansion of the chest is symmetrical.  Heart reveals no abnormal lift or heave.  First and second heart sounds are normal.  There is no murmur gallop rub or click.  The abdomen is soft and nontender.  Bowel sounds are normoactive.  There is no hepatosplenomegaly or mass.  There are no abdominal bruits.  Extremities reveal mild edema right >left. Pedal pulses are present.  There is no cyanosis or clubbing.  Neurologic exam is normal strength and no lateralizing weakness.  No sensory deficits.  Integument reveals no rash  EKG today shows normal sinus rhythm with right bundle branch block and no acute  changes.  No significant change since previous EKG of December 2015  Assessment / Plan: 1.  Previous syncope probably secondary to acute GI blood loss and vasovagal reaction. 2.  Mild diastolic dysfunction with history of chronic bilateral peripheral edema 3.  Right bundle branch block, asymptomatic 4.  Hypothyroidism on thyroid replacement 5.  Essential hypertension, labile  Disposition: We are increasing her orbital wall up to 6.25 mg twice a day for better blood pressure control.  In terms of her mild peripheral edema, she will stay on her current dose of furosemide and low salt diet. She will follow-up with her PCP Dr. Abbott Pao.  We will see her back on a when necessary basis.

## 2014-09-19 ENCOUNTER — Other Ambulatory Visit: Payer: Self-pay | Admitting: Dermatology

## 2014-11-02 ENCOUNTER — Other Ambulatory Visit: Payer: Self-pay | Admitting: Cardiology

## 2014-11-21 ENCOUNTER — Other Ambulatory Visit: Payer: Self-pay

## 2014-11-21 DIAGNOSIS — Z1231 Encounter for screening mammogram for malignant neoplasm of breast: Secondary | ICD-10-CM

## 2014-12-30 ENCOUNTER — Ambulatory Visit
Admission: RE | Admit: 2014-12-30 | Discharge: 2014-12-30 | Disposition: A | Discharge status: Home / Self Care | Payer: Medicare Other | Source: Ambulatory Visit

## 2014-12-30 DIAGNOSIS — Z1231 Encounter for screening mammogram for malignant neoplasm of breast: Secondary | ICD-10-CM

## 2015-01-02 ENCOUNTER — Encounter: Payer: Self-pay | Admitting: Internal Medicine

## 2015-01-18 ENCOUNTER — Ambulatory Visit (INDEPENDENT_AMBULATORY_CARE_PROVIDER_SITE_OTHER): Payer: Medicare Other | Admitting: Ophthalmology

## 2015-01-18 DIAGNOSIS — H35371 Puckering of macula, right eye: Secondary | ICD-10-CM | POA: Diagnosis not present

## 2015-01-18 DIAGNOSIS — E11319 Type 2 diabetes mellitus with unspecified diabetic retinopathy without macular edema: Secondary | ICD-10-CM | POA: Diagnosis not present

## 2015-01-18 DIAGNOSIS — I10 Essential (primary) hypertension: Secondary | ICD-10-CM

## 2015-01-18 DIAGNOSIS — H43813 Vitreous degeneration, bilateral: Secondary | ICD-10-CM | POA: Diagnosis not present

## 2015-01-18 DIAGNOSIS — E11329 Type 2 diabetes mellitus with mild nonproliferative diabetic retinopathy without macular edema: Secondary | ICD-10-CM | POA: Diagnosis not present

## 2015-01-18 DIAGNOSIS — H35033 Hypertensive retinopathy, bilateral: Secondary | ICD-10-CM

## 2015-06-16 ENCOUNTER — Other Ambulatory Visit: Payer: Self-pay | Admitting: Vascular Surgery

## 2015-10-27 ENCOUNTER — Encounter: Payer: Self-pay | Admitting: Gastroenterology

## 2015-11-29 ENCOUNTER — Other Ambulatory Visit: Payer: Self-pay

## 2015-11-29 DIAGNOSIS — Z1231 Encounter for screening mammogram for malignant neoplasm of breast: Secondary | ICD-10-CM

## 2016-01-05 ENCOUNTER — Ambulatory Visit
Admission: RE | Admit: 2016-01-05 | Discharge: 2016-01-05 | Disposition: A | Discharge status: Home / Self Care | Payer: Medicare Other | Source: Ambulatory Visit

## 2016-01-05 DIAGNOSIS — Z1231 Encounter for screening mammogram for malignant neoplasm of breast: Secondary | ICD-10-CM

## 2016-01-19 ENCOUNTER — Ambulatory Visit (INDEPENDENT_AMBULATORY_CARE_PROVIDER_SITE_OTHER): Payer: Medicare Other | Admitting: Ophthalmology

## 2016-01-19 DIAGNOSIS — H35371 Puckering of macula, right eye: Secondary | ICD-10-CM | POA: Diagnosis not present

## 2016-01-19 DIAGNOSIS — H43813 Vitreous degeneration, bilateral: Secondary | ICD-10-CM | POA: Diagnosis not present

## 2016-01-19 DIAGNOSIS — E11319 Type 2 diabetes mellitus with unspecified diabetic retinopathy without macular edema: Secondary | ICD-10-CM

## 2016-01-19 DIAGNOSIS — E113293 Type 2 diabetes mellitus with mild nonproliferative diabetic retinopathy without macular edema, bilateral: Secondary | ICD-10-CM | POA: Diagnosis not present

## 2016-11-26 ENCOUNTER — Other Ambulatory Visit: Payer: Self-pay | Admitting: Internal Medicine

## 2016-11-26 DIAGNOSIS — Z1231 Encounter for screening mammogram for malignant neoplasm of breast: Secondary | ICD-10-CM

## 2017-01-07 ENCOUNTER — Ambulatory Visit
Admission: RE | Admit: 2017-01-07 | Discharge: 2017-01-07 | Disposition: A | Discharge status: Home / Self Care | Payer: Medicare Other | Source: Ambulatory Visit | Attending: Internal Medicine | Admitting: Internal Medicine

## 2017-01-07 DIAGNOSIS — Z1231 Encounter for screening mammogram for malignant neoplasm of breast: Secondary | ICD-10-CM

## 2017-01-20 ENCOUNTER — Ambulatory Visit (INDEPENDENT_AMBULATORY_CARE_PROVIDER_SITE_OTHER): Payer: Medicare Other | Admitting: Ophthalmology

## 2017-01-20 DIAGNOSIS — E113293 Type 2 diabetes mellitus with mild nonproliferative diabetic retinopathy without macular edema, bilateral: Secondary | ICD-10-CM | POA: Diagnosis not present

## 2017-01-20 DIAGNOSIS — H353111 Nonexudative age-related macular degeneration, right eye, early dry stage: Secondary | ICD-10-CM | POA: Diagnosis not present

## 2017-01-20 DIAGNOSIS — H35371 Puckering of macula, right eye: Secondary | ICD-10-CM

## 2017-01-20 DIAGNOSIS — I10 Essential (primary) hypertension: Secondary | ICD-10-CM | POA: Diagnosis not present

## 2017-01-20 DIAGNOSIS — E11319 Type 2 diabetes mellitus with unspecified diabetic retinopathy without macular edema: Secondary | ICD-10-CM

## 2017-01-20 DIAGNOSIS — H43813 Vitreous degeneration, bilateral: Secondary | ICD-10-CM | POA: Diagnosis not present

## 2017-01-20 DIAGNOSIS — H35033 Hypertensive retinopathy, bilateral: Secondary | ICD-10-CM

## 2017-12-15 ENCOUNTER — Other Ambulatory Visit: Payer: Self-pay | Admitting: Internal Medicine

## 2017-12-15 DIAGNOSIS — Z1231 Encounter for screening mammogram for malignant neoplasm of breast: Secondary | ICD-10-CM

## 2018-01-09 ENCOUNTER — Ambulatory Visit
Admission: RE | Admit: 2018-01-09 | Discharge: 2018-01-09 | Disposition: A | Discharge status: Home / Self Care | Payer: Medicare Other | Source: Ambulatory Visit | Attending: Internal Medicine | Admitting: Internal Medicine

## 2018-01-09 DIAGNOSIS — Z1231 Encounter for screening mammogram for malignant neoplasm of breast: Secondary | ICD-10-CM

## 2018-01-21 ENCOUNTER — Ambulatory Visit (INDEPENDENT_AMBULATORY_CARE_PROVIDER_SITE_OTHER): Payer: Medicare Other | Admitting: Ophthalmology

## 2018-01-21 DIAGNOSIS — H43813 Vitreous degeneration, bilateral: Secondary | ICD-10-CM | POA: Diagnosis not present

## 2018-01-21 DIAGNOSIS — H353131 Nonexudative age-related macular degeneration, bilateral, early dry stage: Secondary | ICD-10-CM

## 2018-01-21 DIAGNOSIS — E113293 Type 2 diabetes mellitus with mild nonproliferative diabetic retinopathy without macular edema, bilateral: Secondary | ICD-10-CM

## 2018-01-21 DIAGNOSIS — I10 Essential (primary) hypertension: Secondary | ICD-10-CM

## 2018-01-21 DIAGNOSIS — E11319 Type 2 diabetes mellitus with unspecified diabetic retinopathy without macular edema: Secondary | ICD-10-CM | POA: Diagnosis not present

## 2018-01-21 DIAGNOSIS — H35033 Hypertensive retinopathy, bilateral: Secondary | ICD-10-CM

## 2018-03-08 ENCOUNTER — Other Ambulatory Visit: Payer: Self-pay

## 2018-03-08 ENCOUNTER — Inpatient Hospital Stay (HOSPITAL_COMMUNITY)
Admission: EM | Admit: 2018-03-08 | Discharge: 2018-03-13 | DRG: 689 | Disposition: A | Discharge status: Skilled Nursing Facility | Payer: Medicare Other | Attending: Internal Medicine | Admitting: Internal Medicine

## 2018-03-08 ENCOUNTER — Encounter (HOSPITAL_COMMUNITY): Payer: Self-pay | Admitting: Emergency Medicine

## 2018-03-08 DIAGNOSIS — T502X5A Adverse effect of carbonic-anhydrase inhibitors, benzothiadiazides and other diuretics, initial encounter: Secondary | ICD-10-CM | POA: Diagnosis not present

## 2018-03-08 DIAGNOSIS — N3000 Acute cystitis without hematuria: Principal | ICD-10-CM | POA: Diagnosis present

## 2018-03-08 DIAGNOSIS — Z79890 Hormone replacement therapy (postmenopausal): Secondary | ICD-10-CM

## 2018-03-08 DIAGNOSIS — N179 Acute kidney failure, unspecified: Secondary | ICD-10-CM | POA: Diagnosis present

## 2018-03-08 DIAGNOSIS — E039 Hypothyroidism, unspecified: Secondary | ICD-10-CM | POA: Diagnosis present

## 2018-03-08 DIAGNOSIS — I130 Hypertensive heart and chronic kidney disease with heart failure and stage 1 through stage 4 chronic kidney disease, or unspecified chronic kidney disease: Secondary | ICD-10-CM | POA: Diagnosis present

## 2018-03-08 DIAGNOSIS — R531 Weakness: Secondary | ICD-10-CM

## 2018-03-08 DIAGNOSIS — N183 Chronic kidney disease, stage 3 (moderate): Secondary | ICD-10-CM | POA: Diagnosis present

## 2018-03-08 DIAGNOSIS — M25552 Pain in left hip: Secondary | ICD-10-CM | POA: Diagnosis present

## 2018-03-08 DIAGNOSIS — Z85038 Personal history of other malignant neoplasm of large intestine: Secondary | ICD-10-CM

## 2018-03-08 DIAGNOSIS — D631 Anemia in chronic kidney disease: Secondary | ICD-10-CM | POA: Diagnosis present

## 2018-03-08 DIAGNOSIS — B952 Enterococcus as the cause of diseases classified elsewhere: Secondary | ICD-10-CM | POA: Diagnosis present

## 2018-03-08 DIAGNOSIS — I5032 Chronic diastolic (congestive) heart failure: Secondary | ICD-10-CM | POA: Diagnosis present

## 2018-03-08 DIAGNOSIS — R262 Difficulty in walking, not elsewhere classified: Secondary | ICD-10-CM | POA: Diagnosis present

## 2018-03-08 DIAGNOSIS — I50813 Acute on chronic right heart failure: Secondary | ICD-10-CM

## 2018-03-08 DIAGNOSIS — R109 Unspecified abdominal pain: Secondary | ICD-10-CM

## 2018-03-08 DIAGNOSIS — F0280 Dementia in other diseases classified elsewhere without behavioral disturbance: Secondary | ICD-10-CM | POA: Diagnosis present

## 2018-03-08 DIAGNOSIS — E440 Moderate protein-calorie malnutrition: Secondary | ICD-10-CM | POA: Diagnosis present

## 2018-03-08 DIAGNOSIS — N189 Chronic kidney disease, unspecified: Secondary | ICD-10-CM

## 2018-03-08 DIAGNOSIS — I959 Hypotension, unspecified: Secondary | ICD-10-CM | POA: Diagnosis not present

## 2018-03-08 DIAGNOSIS — R270 Ataxia, unspecified: Secondary | ICD-10-CM

## 2018-03-08 DIAGNOSIS — M160 Bilateral primary osteoarthritis of hip: Secondary | ICD-10-CM | POA: Diagnosis present

## 2018-03-08 DIAGNOSIS — M25551 Pain in right hip: Secondary | ICD-10-CM | POA: Diagnosis present

## 2018-03-08 DIAGNOSIS — E890 Postprocedural hypothyroidism: Secondary | ICD-10-CM | POA: Diagnosis present

## 2018-03-08 DIAGNOSIS — M25559 Pain in unspecified hip: Secondary | ICD-10-CM

## 2018-03-08 DIAGNOSIS — Z8249 Family history of ischemic heart disease and other diseases of the circulatory system: Secondary | ICD-10-CM

## 2018-03-08 DIAGNOSIS — I10 Essential (primary) hypertension: Secondary | ICD-10-CM | POA: Diagnosis present

## 2018-03-08 DIAGNOSIS — Z880 Allergy status to penicillin: Secondary | ICD-10-CM

## 2018-03-08 DIAGNOSIS — G9341 Metabolic encephalopathy: Secondary | ICD-10-CM | POA: Diagnosis present

## 2018-03-08 DIAGNOSIS — G8929 Other chronic pain: Secondary | ICD-10-CM | POA: Diagnosis present

## 2018-03-08 DIAGNOSIS — R413 Other amnesia: Secondary | ICD-10-CM | POA: Diagnosis present

## 2018-03-08 DIAGNOSIS — I5033 Acute on chronic diastolic (congestive) heart failure: Secondary | ICD-10-CM | POA: Diagnosis present

## 2018-03-08 DIAGNOSIS — R4182 Altered mental status, unspecified: Secondary | ICD-10-CM | POA: Diagnosis not present

## 2018-03-08 DIAGNOSIS — I5082 Biventricular heart failure: Secondary | ICD-10-CM | POA: Diagnosis present

## 2018-03-08 DIAGNOSIS — Z79899 Other long term (current) drug therapy: Secondary | ICD-10-CM

## 2018-03-08 DIAGNOSIS — R410 Disorientation, unspecified: Secondary | ICD-10-CM | POA: Diagnosis present

## 2018-03-08 LAB — CBC
HEMATOCRIT: 31.1 % — AB (ref 36.0–46.0)
Hemoglobin: 10.7 g/dL — ABNORMAL LOW (ref 12.0–15.0)
MCH: 31.8 pg (ref 26.0–34.0)
MCHC: 34.4 g/dL (ref 30.0–36.0)
MCV: 92.6 fL (ref 78.0–100.0)
PLATELETS: 225 10*3/uL (ref 150–400)
RBC: 3.36 MIL/uL — AB (ref 3.87–5.11)
RDW: 11.8 % (ref 11.5–15.5)
WBC: 7.6 10*3/uL (ref 4.0–10.5)

## 2018-03-08 LAB — I-STAT CG4 LACTIC ACID, ED: Lactic Acid, Venous: 0.91 mmol/L (ref 0.5–1.9)

## 2018-03-08 NOTE — ED Triage Notes (Signed)
Pt c/o pain all over.  Husband reports altered mental status/confusion since getting injections in back on 7/12.  Also reports increased redness to bilateral lower legs x 2 weeks.  Husband states he believes pt has gotten her medication mixed up and is arguing with him about her medication.

## 2018-03-09 ENCOUNTER — Encounter (HOSPITAL_COMMUNITY): Payer: Self-pay | Admitting: Internal Medicine

## 2018-03-09 ENCOUNTER — Observation Stay (HOSPITAL_COMMUNITY): Payer: Medicare Other

## 2018-03-09 DIAGNOSIS — N179 Acute kidney failure, unspecified: Secondary | ICD-10-CM | POA: Diagnosis present

## 2018-03-09 DIAGNOSIS — R270 Ataxia, unspecified: Secondary | ICD-10-CM

## 2018-03-09 DIAGNOSIS — I5032 Chronic diastolic (congestive) heart failure: Secondary | ICD-10-CM

## 2018-03-09 DIAGNOSIS — R531 Weakness: Secondary | ICD-10-CM | POA: Diagnosis not present

## 2018-03-09 DIAGNOSIS — I10 Essential (primary) hypertension: Secondary | ICD-10-CM | POA: Diagnosis not present

## 2018-03-09 DIAGNOSIS — N189 Chronic kidney disease, unspecified: Secondary | ICD-10-CM

## 2018-03-09 LAB — CBC WITH DIFFERENTIAL/PLATELET
Abs Immature Granulocytes: 0 10*3/uL (ref 0.0–0.1)
Basophils Absolute: 0 10*3/uL (ref 0.0–0.1)
Basophils Relative: 0 %
Eosinophils Absolute: 0.1 10*3/uL (ref 0.0–0.7)
Eosinophils Relative: 1 %
HEMATOCRIT: 29.4 % — AB (ref 36.0–46.0)
Hemoglobin: 10.1 g/dL — ABNORMAL LOW (ref 12.0–15.0)
Immature Granulocytes: 1 %
Lymphocytes Relative: 22 %
Lymphs Abs: 1.4 10*3/uL (ref 0.7–4.0)
MCH: 32.2 pg (ref 26.0–34.0)
MCHC: 34.4 g/dL (ref 30.0–36.0)
MCV: 93.6 fL (ref 78.0–100.0)
Monocytes Absolute: 0.7 10*3/uL (ref 0.1–1.0)
Monocytes Relative: 11 %
NEUTROS ABS: 4.2 10*3/uL (ref 1.7–7.7)
NEUTROS PCT: 65 %
Platelets: 229 10*3/uL (ref 150–400)
RBC: 3.14 MIL/uL — ABNORMAL LOW (ref 3.87–5.11)
RDW: 12.1 % (ref 11.5–15.5)
WBC: 6.5 10*3/uL (ref 4.0–10.5)

## 2018-03-09 LAB — URINALYSIS, ROUTINE W REFLEX MICROSCOPIC
BILIRUBIN URINE: NEGATIVE
GLUCOSE, UA: NEGATIVE mg/dL
HGB URINE DIPSTICK: NEGATIVE
Ketones, ur: 5 mg/dL — AB
NITRITE: NEGATIVE
PROTEIN: NEGATIVE mg/dL
Specific Gravity, Urine: 1.01 (ref 1.005–1.030)
pH: 5 (ref 5.0–8.0)

## 2018-03-09 LAB — BASIC METABOLIC PANEL
ANION GAP: 12 (ref 5–15)
BUN: 18 mg/dL (ref 8–23)
CALCIUM: 9.2 mg/dL (ref 8.9–10.3)
CO2: 27 mmol/L (ref 22–32)
CREATININE: 1.15 mg/dL — AB (ref 0.44–1.00)
Chloride: 95 mmol/L — ABNORMAL LOW (ref 98–111)
GFR, EST AFRICAN AMERICAN: 50 mL/min — AB (ref >60)
GFR, EST NON AFRICAN AMERICAN: 43 mL/min — AB (ref >60)
GLUCOSE: 85 mg/dL (ref 70–99)
Potassium: 3.5 mmol/L (ref 3.5–5.1)
Sodium: 134 mmol/L — ABNORMAL LOW (ref 135–145)

## 2018-03-09 LAB — HEPATIC FUNCTION PANEL
ALT: 19 U/L (ref 0–44)
AST: 29 U/L (ref 15–41)
Albumin: 3.2 g/dL — ABNORMAL LOW (ref 3.5–5.0)
Alkaline Phosphatase: 54 U/L (ref 38–126)
Bilirubin, Direct: 0.2 mg/dL (ref 0.0–0.2)
Indirect Bilirubin: 1 mg/dL — ABNORMAL HIGH (ref 0.3–0.9)
Total Bilirubin: 1.2 mg/dL (ref 0.3–1.2)
Total Protein: 5.6 g/dL — ABNORMAL LOW (ref 6.5–8.1)

## 2018-03-09 LAB — IRON AND TIBC
IRON: 43 ug/dL (ref 28–170)
SATURATION RATIOS: 15 % (ref 10.4–31.8)
TIBC: 290 ug/dL (ref 250–450)
UIBC: 247 ug/dL

## 2018-03-09 LAB — COMPREHENSIVE METABOLIC PANEL
ALT: 21 U/L (ref 0–44)
AST: 31 U/L (ref 15–41)
Albumin: 3.7 g/dL (ref 3.5–5.0)
Alkaline Phosphatase: 63 U/L (ref 38–126)
Anion gap: 11 (ref 5–15)
BUN: 25 mg/dL — ABNORMAL HIGH (ref 8–23)
CHLORIDE: 92 mmol/L — AB (ref 98–111)
CO2: 26 mmol/L (ref 22–32)
CREATININE: 1.38 mg/dL — AB (ref 0.44–1.00)
Calcium: 9.4 mg/dL (ref 8.9–10.3)
GFR calc non Af Amer: 35 mL/min — ABNORMAL LOW (ref >60)
GFR, EST AFRICAN AMERICAN: 40 mL/min — AB (ref >60)
Glucose, Bld: 112 mg/dL — ABNORMAL HIGH (ref 70–99)
Potassium: 3.8 mmol/L (ref 3.5–5.1)
Sodium: 129 mmol/L — ABNORMAL LOW (ref 135–145)
Total Bilirubin: 1 mg/dL (ref 0.3–1.2)
Total Protein: 6.4 g/dL — ABNORMAL LOW (ref 6.5–8.1)

## 2018-03-09 LAB — RETICULOCYTES
RBC.: 3.14 MIL/uL — ABNORMAL LOW (ref 3.87–5.11)
RETIC COUNT ABSOLUTE: 40.8 10*3/uL (ref 19.0–186.0)
Retic Ct Pct: 1.3 % (ref 0.4–3.1)

## 2018-03-09 LAB — FERRITIN: Ferritin: 145 ng/mL (ref 11–307)

## 2018-03-09 LAB — I-STAT CG4 LACTIC ACID, ED: Lactic Acid, Venous: 0.56 mmol/L (ref 0.5–1.9)

## 2018-03-09 LAB — TROPONIN I
Troponin I: <0.03 ng/mL (ref <0.03)
Troponin I: <0.03 ng/mL (ref <0.03)

## 2018-03-09 LAB — VITAMIN B12: Vitamin B-12: 487 pg/mL (ref 180–914)

## 2018-03-09 LAB — TSH: TSH: 4.398 u[IU]/mL (ref 0.350–4.500)

## 2018-03-09 LAB — FOLATE: FOLATE: 23.5 ng/mL (ref >5.9)

## 2018-03-09 MED ORDER — FUROSEMIDE 10 MG/ML IJ SOLN
20.0000 mg | Freq: Two times a day (BID) | INTRAMUSCULAR | Status: DC
  Administered 2018-03-09 – 2018-03-12 (×6): 20 mg via INTRAVENOUS
  Filled 2018-03-09 (×6): qty 2

## 2018-03-09 MED ORDER — HYDRALAZINE HCL 20 MG/ML IJ SOLN: 5.0000 mg | INTRAMUSCULAR | Status: DC | PRN

## 2018-03-09 MED ORDER — ENSURE ENLIVE PO LIQD
237.0000 mL | Freq: Two times a day (BID) | ORAL | Status: DC
  Administered 2018-03-09 – 2018-03-12 (×6): 237 mL via ORAL

## 2018-03-09 MED ORDER — CARVEDILOL 6.25 MG PO TABS
6.2500 mg | ORAL_TABLET | Freq: Two times a day (BID) | ORAL | Status: DC
  Administered 2018-03-09 – 2018-03-12 (×7): 6.25 mg via ORAL
  Filled 2018-03-09 (×8): qty 1

## 2018-03-09 MED ORDER — ACETAMINOPHEN 650 MG RE SUPP: 650.0000 mg | Freq: Four times a day (QID) | RECTAL | Status: DC | PRN

## 2018-03-09 MED ORDER — ACETAMINOPHEN 325 MG PO TABS
650.0000 mg | ORAL_TABLET | Freq: Four times a day (QID) | ORAL | Status: DC | PRN
  Administered 2018-03-09 – 2018-03-11 (×2): 650 mg via ORAL
  Filled 2018-03-09 (×2): qty 2

## 2018-03-09 MED ORDER — ONDANSETRON HCL 4 MG/2ML IJ SOLN: 4.0000 mg | Freq: Four times a day (QID) | INTRAMUSCULAR | Status: DC | PRN

## 2018-03-09 MED ORDER — CIPROFLOXACIN IN D5W 200 MG/100ML IV SOLN
200.0000 mg | Freq: Once | INTRAVENOUS | Status: AC
  Administered 2018-03-09: 200 mg via INTRAVENOUS
  Filled 2018-03-09: qty 100

## 2018-03-09 MED ORDER — ENOXAPARIN SODIUM 30 MG/0.3ML ~~LOC~~ SOLN
30.0000 mg | SUBCUTANEOUS | Status: DC
  Administered 2018-03-09: 30 mg via SUBCUTANEOUS
  Filled 2018-03-09: qty 0.3

## 2018-03-09 MED ORDER — ENOXAPARIN SODIUM 40 MG/0.4ML ~~LOC~~ SOLN: 40.0000 mg | SUBCUTANEOUS | Status: DC

## 2018-03-09 MED ORDER — PANTOPRAZOLE SODIUM 40 MG PO TBEC
40.0000 mg | DELAYED_RELEASE_TABLET | Freq: Every day | ORAL | Status: DC
  Administered 2018-03-09 – 2018-03-13 (×5): 40 mg via ORAL
  Filled 2018-03-09 (×5): qty 1

## 2018-03-09 MED ORDER — ONDANSETRON HCL 4 MG PO TABS: 4.0000 mg | ORAL_TABLET | Freq: Four times a day (QID) | ORAL | Status: DC | PRN

## 2018-03-09 MED ORDER — LEVOTHYROXINE SODIUM 88 MCG PO TABS
88.0000 ug | ORAL_TABLET | Freq: Every day | ORAL | Status: DC
  Administered 2018-03-09 – 2018-03-13 (×5): 88 ug via ORAL
  Filled 2018-03-09 (×5): qty 1

## 2018-03-09 MED ORDER — CIPROFLOXACIN IN D5W 200 MG/100ML IV SOLN
200.0000 mg | INTRAVENOUS | Status: DC
  Administered 2018-03-10: 200 mg via INTRAVENOUS
  Filled 2018-03-09: qty 100

## 2018-03-09 MED ORDER — CALCITRIOL 0.25 MCG PO CAPS
0.2500 ug | ORAL_CAPSULE | Freq: Every day | ORAL | Status: DC
  Administered 2018-03-09 – 2018-03-13 (×5): 0.25 ug via ORAL
  Filled 2018-03-09 (×5): qty 1

## 2018-03-09 NOTE — Progress Notes (Signed)
Pharmacy Antibiotic Note  KEAMBER MACFADDEN is a 84 y.o. female admitted on 03/08/2018 with UTI.  Pharmacy has been consulted for Cipro dosing.  Plan: Cipro 200mg  IV Q24H.  Height: 5\' 6"  (167.6 cm) Weight: 134 lb 7.7 oz (61 kg) IBW/kg (Calculated) : 59.3  Temp (24hrs), Avg:98.3 F (36.8 C), Min:98.1 F (36.7 C), Max:98.5 F (36.9 C)  Recent Labs  Lab 03/08/18 2331 03/08/18 2342 03/09/18 0250  WBC 7.6  --   --   CREATININE 1.38*  --   --   LATICACIDVEN  --  0.91 0.56    Estimated Creatinine Clearance: 29.4 mL/min (A) (by C-G formula based on SCr of 1.38 mg/dL (H)).    Allergies  Allergen Reactions  . Penicillins Hives and Itching     Thank you for allowing pharmacy to be a part of this patient's care.  Wynona Neat, PharmD, BCPS  03/09/2018 6:26 AM

## 2018-03-09 NOTE — Progress Notes (Signed)
Initial Nutrition Assessment  DOCUMENTATION CODES:   Non-severe (moderate) malnutrition in context of chronic illness  INTERVENTION:   - Continue Ensure Enlive po BID, each supplement provides 350 kcal and 20 grams of protein (chocolate flavor)  - Encourage adequate PO intakes  NUTRITION DIAGNOSIS:   Moderate Malnutrition related to chronic illness(CHF) as evidenced by mild fat depletion, moderate fat depletion, mild muscle depletion, moderate muscle depletion.  GOAL:   Patient will meet greater than or equal to 90% of their needs  MONITOR:   PO intake, Weight trends, I & O's, Skin, Labs, Supplement acceptance  REASON FOR ASSESSMENT:   Malnutrition Screening Tool    ASSESSMENT:   84 year old female who presented to the ED with AMS. PMH significant for CHF, hypertension, hypothyroidism, colon cancer s/p resection in remission.  Spoke with pt at bedside. Pt slightly confused.  Pt reports that she has "always had a weight problem." Pt expresses that she does not want to "gain any weight back." Per pt, her UBW is 152 lbs, but she has lost weight and been maintaining between 138-140 lbs over the past several months.  Weight history in chart is limited as last weight recorded PTA was in 2016.  Pt and husband confirm that pt eats "at least 2" meals daily with snacks. Pt unable to provide clear information regarding what she may eat at a meal. Pt's husband reports that they eat a "big dinner" on Sunday and that pt "eats a lot of green beans." Pt shares that she drinks diet [facility] and enjoys crackers and cookies.  Pt states she ate "well" at lunch and had a pot roast, creamed potatoes, and broccoli. Pt reports she has been drinking the Ensure Enlive that is ordered and would like the chocolate flavor.  Meal Completion: 100%  Medications reviewed and include: 0.25 mcg calcitriol daily, Ensure Enlive BID, 88 mcg levothyroxine daily, 40 mg Protonix daily  Labs reviewed:  sodium 134 (L), chloride 95 (L), creaitnine 1.15 (H), hemoglobin 10.1 (L), HCT 29.4 (L)  NUTRITION - FOCUSED PHYSICAL EXAM:    Most Recent Value  Orbital Region  Mild depletion  Upper Arm Region  Moderate depletion  Thoracic and Lumbar Region  Mild depletion  Buccal Region  Mild depletion  Temple Region  Mild depletion  Clavicle Bone Region  Moderate depletion  Clavicle and Acromion Bone Region  Moderate depletion  Scapular Bone Region  Unable to assess  Dorsal Hand  Mild depletion  Patellar Region  Moderate depletion  Anterior Thigh Region  Mild depletion  Posterior Calf Region  Mild depletion  Edema (RD Assessment)  Moderate [BLE]  Hair  Reviewed  Eyes  Reviewed  Mouth  Reviewed  Skin  Reviewed  Nails  Reviewed       Diet Order:   Diet Order           Diet Heart Room service appropriate? Yes; Fluid consistency: Thin; Fluid restriction: 1200 mL Fluid  Diet effective now          EDUCATION NEEDS:   No education needs have been identified at this time  Skin:  Skin Assessment: Skin Integrity Issues: Other: cellulitis to BLE  Last BM:  03/08/18  Height:   Ht Readings from Last 1 Encounters:  03/09/18 5\' 6"  (1.676 m)    Weight:   Wt Readings from Last 1 Encounters:  03/09/18 134 lb 7.7 oz (61 kg)    Ideal Body Weight:  59.09 kg  BMI:  Body mass index is  21.71 kg/m.  Estimated Nutritional Needs:   Kcal:  1550-1750 kcal/day  Protein:  75-90 grams/day  Fluid:  per MD    Gaynell Face, MS, RD, LDN Pager: [phone] Weekend/After Hours: [phone]

## 2018-03-09 NOTE — ED Notes (Signed)
Patient c/o  Neck pain back pain , patient will just laugh out inappropriately

## 2018-03-09 NOTE — H&P (Signed)
History and Physical    Regina [name] CHY:[id number] DOB: May 20, 1933 DOA: 03/08/2018  PCP: [name], MD  Patient coming from: Home.  Chief Complaint: Difficulty walking.  HPI: Regina [name] is a 84 y.o. female with history of diastolic CHF, hypertension, hypothyroidism, colon cancer status post resection in remission was brought to the ER after patient was found to have increasing difficulty ambulating with imbalance.  Patient's family states that they were in beats over the last 1 week patient has not come out of her room because of the difficulty walking.  [place] the beach patient had a epidural injection in the lumbar area for chronic low back pain.  Since then patient has been gradually worsening over the last 2 weeks.  Increasing weakness difficult ambulating.  Denies any fever chills or incontinence of urine or bowels.  Denies any weakness of the upper extremities.  When patient tries to walk she feels very weak and her legs give away.  Denies any chest pain shortness of breath nausea vomiting diarrhea.patient has been having poor appetite.  Over the last few days as per the patient's husband was at the bedside patient has become increasingly confused and has taken more than her usual dose of Lasix and some of her other medications to.  ED Course: In the ER her creatinine is found to be around 1.3 the last one in our system was in 2015 which was normal.  Patient states she also was recently started on calcitriol by her physician 3 months ago.  Hemoglobin is stable at baseline.  On exam patient has bilateral lower extremity edema with erythema of the both lower extremity which patient's family states is chronic but has increased.  Patient was not orthostatic.  Patient states he has bilateral hip pain when she tries to walk in addition to the weakness.  Patient has good deep tendon reflexes on exam.  UA showing possibility of UTI.  Patient complains of bilateral flank pain.  Review  of Systems: As per HPI, rest all negative.   Past Medical History:  Diagnosis Date  . Allergy   . Barrett esophagus 2010  . Cancer ([place])   . GERD (gastroesophageal reflux disease)   . Hypertension   . Thyroid disease     Past Surgical History:  Procedure Laterality Date  . APPENDECTOMY    . COLON SURGERY    . COLONOSCOPY    . COLONOSCOPY N/A 08/03/2014   Procedure: COLONOSCOPY;  Surgeon: Inda Castle, MD;  Location: [facility];  Service: Endoscopy;  Laterality: N/A;  . POLYPECTOMY    . TONSILLECTOMY    . TOTAL THYROIDECTOMY    . TUBAL LIGATION       reports that she has never smoked. She has never used smokeless tobacco. She reports that she does not drink alcohol or use drugs.  Allergies  Allergen Reactions  . Penicillins Hives and Itching    Family History  Problem Relation Age of Onset  . Heart disease Mother   . Heart disease Father   . Diabetes Sister   . COPD Brother   . Diabetes Brother     Prior to Admission medications   Medication Sig Start Date End Date Taking? Authorizing Provider  calcitRIOL (ROCALTROL) 0.25 MCG capsule Take 0.25 mcg by mouth daily.   Yes [provider]  carvedilol (COREG) 6.25 MG tablet TAKE ONE TABLET BY MOUTH TWICE DAILY WITH MEALS 11/02/14  Yes Darlin Coco, MD  esomeprazole ([place]) 40  MG capsule Take 40 mg by mouth daily as needed (heartburn).    Yes [provider]  furosemide (LASIX) 40 MG tablet Take 40 mg by mouth 2 (two) times daily.    Yes [provider]  levothyroxine (SYNTHROID, LEVOTHROID) 88 MCG tablet Take 88 mcg by mouth daily before breakfast.   Yes [provider]  telmisartan (MICARDIS) 80 MG tablet Take 80 mg by mouth daily.   Yes [provider]    Physical Exam: Vitals:   03/09/18 0130 03/09/18 0400 03/09/18 0430 03/09/18 0550  BP: (!) 152/61 (!) 145/45 (!) 157/44 (!) 146/47  Pulse: 74 73 71 83  Resp: 17 14 11 18   Temp:    98.5 F (36.9 C)  TempSrc:     Oral  SpO2: 99% 99% 99% 99%  Weight:    61 kg (134 lb 7.7 oz)  Height:    5\' 6"  (1.676 m)      Constitutional: Moderately built and nourished. Vitals:   03/09/18 0130 03/09/18 0400 03/09/18 0430 03/09/18 0550  BP: (!) 152/61 (!) 145/45 (!) 157/44 (!) 146/47  Pulse: 74 73 71 83  Resp: 17 14 11 18   Temp:    98.5 F (36.9 C)  TempSrc:    Oral  SpO2: 99% 99% 99% 99%  Weight:    61 kg (134 lb 7.7 oz)  Height:    5\' 6"  (1.676 m)   Eyes: Anicteric no pallor. ENMT: No discharge from the ears eyes nose or mouth. Neck: No mass felt.  No neck rigidity but no JVD appreciated. Respiratory: No rhonchi or crepitations. Cardiovascular: S1-S2 heard no murmurs appreciated. Abdomen: Soft nontender bowel sounds present.  No guarding or rigidity. Musculoskeletal: Bilateral lower extremity edema with pain on moving the hips. Skin: Bilateral lower extremity erythema. Neurologic: Alert awake oriented to time place and person.  Both upper extremities are 5 x 5.  Both lower extremities have difficulty moving because of pain. Psychiatric: Appears normal per normal affect.   Labs on Admission: I have personally reviewed following labs and imaging studies  CBC: Recent Labs  Lab 03/08/18 2331  WBC 7.6  HGB 10.7*  HCT 31.1*  MCV 92.6  PLT 382   Basic Metabolic Panel: Recent Labs  Lab 03/08/18 2331  NA 129*  K 3.8  CL 92*  CO2 26  GLUCOSE 112*  BUN 25*  CREATININE 1.38*  CALCIUM 9.4   GFR: Estimated Creatinine Clearance: 29.4 mL/min (A) (by C-G formula based on SCr of 1.38 mg/dL (H)). Liver Function Tests: Recent Labs  Lab 03/08/18 2331  AST 31  ALT 21  ALKPHOS 63  BILITOT 1.0  PROT 6.4*  ALBUMIN 3.7   No results for input(s): LIPASE, AMYLASE in the last 168 hours. No results for input(s): AMMONIA in the last 168 hours. Coagulation Profile: No results for input(s): INR, PROTIME in the last 168 hours. Cardiac Enzymes: Recent Labs  Lab 03/09/18 0245  TROPONINI <0.03    BNP (last 3 results) No results for input(s): PROBNP in the last 8760 hours. HbA1C: No results for input(s): HGBA1C in the last 72 hours. CBG: No results for input(s): GLUCAP in the last 168 hours. Lipid Profile: No results for input(s): CHOL, HDL, LDLCALC, TRIG, CHOLHDL, LDLDIRECT in the last 72 hours. Thyroid Function Tests: No results for input(s): TSH, T4TOTAL, FREET4, T3FREE, THYROIDAB in the last 72 hours. Anemia Panel: No results for input(s): VITAMINB12, FOLATE, FERRITIN, TIBC, IRON, RETICCTPCT in the last 72 hours. Urine analysis:  Component Value Date/Time   COLORURINE YELLOW 03/08/2018 2325   APPEARANCEUR CLEAR 03/08/2018 2325   LABSPEC 1.010 03/08/2018 2325   PHURINE 5.0 03/08/2018 2325   GLUCOSEU NEGATIVE 03/08/2018 2325   HGBUR NEGATIVE 03/08/2018 2325   BILIRUBINUR NEGATIVE 03/08/2018 2325   KETONESUR 5 (A) 03/08/2018 2325   PROTEINUR NEGATIVE 03/08/2018 2325   UROBILINOGEN 0.2 07/30/2014 2003   NITRITE NEGATIVE 03/08/2018 2325   LEUKOCYTESUR SMALL (A) 03/08/2018 2325   Sepsis Labs: @LABRCNTIP (procalcitonin:4,lacticidven:4) )No results found for this or any previous visit (from the past 240 hour(s)).   Radiological Exams on Admission: Dg Pelvis 1-2 Views  Result Date: 03/09/2018 CLINICAL DATA:  Bilateral hip pain. EXAM: PELVIS - 1-2 VIEW COMPARISON:  None. FINDINGS: The cortical margins of the bony pelvis are intact. No fracture. Pubic symphysis and sacroiliac joints are congruent. Both femoral heads are well-seated in the respective acetabula. The joint spaces are preserved. Degenerative disc disease at L4-L5, partially included. IMPRESSION: 1. No acute abnormality. 2. L4-L5 degenerative change, partially included. Electronically Signed   By: Jeb Levering M.D.   On: 03/09/2018 05:47    EKG: Independently reviewed.  Normal sinus rhythm.  Assessment/Plan Principal Problem:   Weakness generalized Active Problems:   Chronic diastolic heart failure  (HCC)   Essential hypertension   Hypothyroidism   Ataxia   Acute renal failure superimposed on chronic kidney disease (HCC)   Weakness    1. Generalized weakness with difficulty ambulating -suspect mainly limited by pain in the hip.  However will check MRI of the brain C-spine and L-spine.  Get physical therapy consult. 2. Acute renal failure with no recent labs to compare.  Will hold Lasix and ARB for now and recheck labs.  Patient complains of bilateral flank pain for which sonogram of the abdomen has been ordered. 3. Bilateral hip pain x-[place] pelvis has been ordered. 4. Hypothyroidism on Synthroid. 5. Chronic diastolic CHF last EF measured in 2015 was 55 to 60% with grade 1 diastolic dysfunction.  Holding Lasix due to renal failure. 6. Hypertension on Coreg and ARB.  Holding ARB due to increased creatinine from baseline.  Will place patient on PRN IV hydralazine. 7. Chronic anemia -we will check anemia panel.  Follow CBC. 8. Possible UTI on Cipro.  Follow urine cultures.   DVT prophylaxis: Lovenox. Code Status: Full code. Family Communication: Patient husband and daughter. Disposition Plan: To be determined. Consults called: Physical therapy. Admission status: Observation.   Rise Patience MD [facility] Pager [phone].  If 7PM-7AM, please contact night-coverage [email or web address] Password [facility]  03/09/2018, 6:05 AM

## 2018-03-09 NOTE — ED Provider Notes (Signed)
[facility] EMERGENCY DEPARTMENT Provider Note   CSN: [id number] Arrival date & time: 03/08/18  2258     History   Chief Complaint Chief Complaint  Patient presents with  . Altered Mental Status    HPI Regina [name] is a 84 y.o. female.  HPI 84 year old female comes in a chief complaint of abnormal gait, worsening swelling in the legs and confusion. Patient has history of diastolic CHF, hypertension, thyroid disorder.  According to the family, patient has been having generalized weakness the last few days, with ataxic gait.  Patient is now unable to get out of house like she normally was able to.  Additionally, they have also noted that patient has been more agitated and confused about her medications.  Patient has not been taking her Lasix as prescribed, and then she wakes up in the middle night sometimes to take an extra dose of medications.  Additionally, they have noted worsening swelling in the legs.  Patient has been advised to take Lasix 40 mg twice a day instead of once a day, but family does not think patient has been following the physician's order.  Patient denies any new nausea, vomiting, fevers, chills, chest pain, cough, shortness of breath, vision changes, new numbness or focal weakness.     Past Medical History:  Diagnosis Date  . Allergy   . Barrett esophagus 2010  . Cancer ([place])   . GERD (gastroesophageal reflux disease)   . Hypertension   . Thyroid disease     Patient Active Problem List   Diagnosis Date Noted  . Ataxia 03/09/2018  . Internal hemorrhoids with complication 33/43/5686  . Ulcer of lower limb ([place]) 08/02/2014  . Chronic diastolic heart failure ([place]) 08/01/2014  . Right bundle branch block 08/01/2014  . Essential hypertension 08/01/2014  . Hypothyroidism 08/01/2014  . Rectal bleeding 07/31/2014  . Hypertension 07/31/2014  . GERD (gastroesophageal reflux disease) 07/31/2014  . Anemia due to other cause 07/31/2014  . Lower  GI bleeding   . Faintness   . Syncope 07/30/2014  . Varicose veins of lower extremities with other complications 16/83/7290    Past Surgical History:  Procedure Laterality Date  . APPENDECTOMY    . COLON SURGERY    . COLONOSCOPY    . COLONOSCOPY N/A 08/03/2014   Procedure: COLONOSCOPY;  Surgeon: Inda Castle, MD;  Location: [facility];  Service: Endoscopy;  Laterality: N/A;  . POLYPECTOMY    . TONSILLECTOMY    . TOTAL THYROIDECTOMY    . TUBAL LIGATION       OB History   None      Home Medications    Prior to Admission medications   Medication Sig Start Date End Date Taking? Authorizing Provider  calcitRIOL (ROCALTROL) 0.25 MCG capsule Take 0.25 mcg by mouth daily.   Yes [provider]  carvedilol (COREG) 6.25 MG tablet TAKE ONE TABLET BY MOUTH TWICE DAILY WITH MEALS 11/02/14  Yes Darlin Coco, MD  esomeprazole (NEXIUM) 40 MG capsule Take 40 mg by mouth daily as needed (heartburn).    Yes [provider]  furosemide (LASIX) 40 MG tablet Take 40 mg by mouth 2 (two) times daily.    Yes [provider]  levothyroxine (SYNTHROID, LEVOTHROID) 88 MCG tablet Take 88 mcg by mouth daily before breakfast.   Yes [provider]  telmisartan (MICARDIS) 80 MG tablet Take 80 mg by mouth daily.   Yes [provider]    Family History Family History  Problem Relation Age of Onset  . Heart disease Mother   . Heart disease Father   . Diabetes Sister   . COPD Brother   . Diabetes Brother     Social History Social History   Tobacco Use  . Smoking status: Never Smoker  . Smokeless tobacco: Never Used  Substance Use Topics  . Alcohol use: No  . Drug use: No     Allergies   Penicillins   Review of Systems Review of Systems  Constitutional: Positive for activity change.  Respiratory: Negative for shortness of breath.   Cardiovascular: Negative for chest pain.  Gastrointestinal: Negative for nausea and vomiting.    Neurological: Positive for weakness.  Psychiatric/Behavioral: Positive for agitation and behavioral problems.  All other systems reviewed and are negative.    Physical Exam Updated Vital Signs BP (!) 152/61   Pulse 74   Temp 98.1 F (36.7 C) (Oral)   Resp 17   SpO2 99%   Physical Exam  Constitutional: She is oriented to person, place, and time. She appears well-developed.  HENT:  Head: Normocephalic and atraumatic.  Eyes: EOM are normal.  Neck: Normal range of motion. Neck supple.  Cardiovascular: Normal rate.  Pulmonary/Chest: Effort normal.  Abdominal: Bowel sounds are normal.  Musculoskeletal: She exhibits edema.  Neurological: She is alert and oriented to person, place, and time. No cranial nerve deficit. Coordination normal.  Skin: Skin is warm and dry. Rash noted.  Nursing note and vitals reviewed.    ED Treatments / Results  Labs (all labs ordered are listed, but only abnormal results are displayed) Labs Reviewed  COMPREHENSIVE METABOLIC PANEL - Abnormal; Notable for the following components:      Result Value   Sodium 129 (*)    Chloride 92 (*)    Glucose, Bld 112 (*)    BUN 25 (*)    Creatinine, Ser 1.38 (*)    Total Protein 6.4 (*)    GFR calc non Af Amer 35 (*)    GFR calc Af Amer 40 (*)    All other components within normal limits  CBC - Abnormal; Notable for the following components:   RBC 3.36 (*)    Hemoglobin 10.7 (*)    HCT 31.1 (*)    All other components within normal limits  URINALYSIS, ROUTINE W REFLEX MICROSCOPIC - Abnormal; Notable for the following components:   Ketones, ur 5 (*)    Leukocytes, UA SMALL (*)    Bacteria, UA RARE (*)    All other components within normal limits  TROPONIN I  I-STAT CG4 LACTIC ACID, ED  I-STAT CG4 LACTIC ACID, ED    EKG EKG Interpretation  Date/Time:  Sunday March 08 2018 23:21:36 EDT Ventricular Rate:  86 PR Interval:  174 QRS Duration: 112 QT Interval:  394 QTC Calculation: 471 R  Axis:   64 Text Interpretation:  Normal sinus rhythm Possible Left atrial enlargement Incomplete right bundle branch block Nonspecific T wave abnormality Prolonged QT Abnormal ECG No acute changes Confirmed by Varney Biles ([id number]) on 03/09/2018 1:02:57 AM   Radiology No results found.  Procedures Procedures (including critical care time)  Medications Ordered in ED Medications - No data to display   Initial Impression / Assessment and Plan / ED Course  I have reviewed the triage vital signs and the nursing notes.  Pertinent labs & imaging results that were available during my care of the patient were reviewed by me and considered in my medical decision making (  see chart for details).    Elderly patient with a history of hypertension, hypothyroidism and on Lasix for venous stasis versus diastolic CHF comes in with multiple complaints.  According to family, over the past few days patient has had worsening weakness, increasing ataxia and confusion.  There is also concern about worsening swelling in the leg and medication noncompliance.  Exam reveals stasis dermatitis in both extremities.  Patient on review of system does not have any infection-like symptoms, besides urinary frequency.   Basic labs have been ordered, creatinine appears to be slightly elevated than usual.  Doubling the Lasix in light of elevated creatinine, might lead to worsening renal function -especially in a patient who appears to be not following through on the medications as prescribed.  We will admit patient as an observation status.  Patient will also need PT-OT evaluation.  My concerns for stroke is extremely low.    Final Clinical Impressions(s) / ED Diagnoses   Final diagnoses:  Acute renal failure superimposed on chronic kidney disease, unspecified CKD stage, unspecified acute renal failure type ([place])  Acute on chronic right-sided congestive heart failure [facility])  Ataxia    ED Discharge Orders    None        Varney Biles, MD 03/09/18 [phone]

## 2018-03-09 NOTE — Progress Notes (Addendum)
Pt seen and examined.  No charge today as H&P was done after midnight.  Pt is up in chair.  Remains confused. Husband says he has noticed some memory problems over the last few months.  Possible that UTI causing delirium in patient w mild dementia.  Also her legs are quite swollen , also erythematous but don't think is cellulitis.  No ^wbc or fever.  I think she needs some diuresis and a cardiac echo.  Dx'd w/ diast CHF by ECHO during 2015 admission but did not have clinical CHF at that time, echo done for syncope w associated GIB.  Cont to hold ARB while diuresing.    home meds:  - coreg 6.25 bid/ telmisartan 80 qd/ lasix 40 qd  - synthroid 88 ug qd/ nexium 40 qd/ rocaltrol   Will plan on continue Rx for UTI w/ cipro IV, add IV lasix 20 bid, get ECHO and follow mental status.  MRI of brain and C-spine/ L-spine are still pending.     Kelly Splinter MD [facility] pgr [phone] 01/04/2018, 9:25 AM

## 2018-03-09 NOTE — Evaluation (Signed)
Physical Therapy Evaluation Patient Details Name: Regina [name] MRN: [id number] DOB: 10/11/1932 Today's Date: 03/09/2018   History of Present Illness  84 yo admitted with generalized weakness and UTI. PMhx: HTN, CHF, colon CA, hypothyroidism, LBP  Clinical Impression  Pt pleasant with flat affect and self-limiting behavior who needs reassurance and encouragement to maximize function and mobility. Pt incontinent of urine with return to room with assist for toileting and linen change. Pt to chair end of session but transporter arrived and pt moved back to bed for test. Pt with decreased strength, balance, gait, function and slow processing who will benefit from acute therapy to maximize mobility, function and independence.      Follow Up Recommendations SNF;Supervision/Assistance - 24 hour    Equipment Recommendations  3in1 (PT)    Recommendations for Other Services       Precautions / Restrictions Precautions Precautions: Fall      Mobility  Bed Mobility Overal bed mobility: Needs Assistance Bed Mobility: Rolling;Sidelying to Sit Rolling: Min assist Sidelying to sit: Min assist       General bed mobility comments: cues for sequence with assist to bend knees, reach for rail and elevate trunk from surface  Transfers Overall transfer level: Needs assistance   Transfers: Sit to/from Stand Sit to Stand: Min assist         General transfer comment: assist to rise from bed, toilet and chair with cues for hand placement, safety and sequence  Ambulation/Gait Ambulation/Gait assistance: Min assist Gait Distance (Feet): 150 Feet Assistive device: Rolling walker (2 wheeled) Gait Pattern/deviations: Step-through pattern;Decreased stride length;Trunk flexed;Narrow base of support   Gait velocity interpretation: <1.8 ft/sec, indicate of risk for recurrent falls General Gait Details: assist to direct and manipulate RW with cues for increased gait speed and distance. pt easily  distracted and needs cues to attend to task  Stairs            Wheelchair Mobility    Modified Rankin (Stroke Patients Only)       Balance Overall balance assessment: Needs assistance   Sitting balance-Leahy Scale: Fair     Standing balance support: Bilateral upper extremity supported Standing balance-Leahy Scale: Poor                               Pertinent Vitals/Pain Pain Assessment: No/denies pain    Home Living Family/patient expects to be discharged to:: Private residence Living Arrangements: Spouse/significant other Available Help at Discharge: Family;Available 24 hours/day Type of Home: House Home Access: Stairs to enter   [employer] of Steps: 2 Home Layout: One level Home Equipment: Walker - 2 wheels      Prior Function Level of Independence: Needs assistance   Gait / Transfers Assistance Needed: pt states she has been using RW recently and that spouse assists at times for night time trips to toilet  ADL's / Homemaking Assistance Needed: pt reports she normally can perform ADLs but has required assist recently with feeling weak        Hand Dominance        Extremity/Trunk Assessment   Upper Extremity Assessment Upper Extremity Assessment: Generalized weakness    Lower Extremity Assessment Lower Extremity Assessment: Generalized weakness    Cervical / Trunk Assessment Cervical / Trunk Assessment: Kyphotic  Communication   Communication: No difficulties  Cognition Arousal/Alertness: Awake/alert Behavior During Therapy: Flat affect Overall Cognitive Status: Impaired/Different from baseline Area of Impairment: Orientation;Safety/judgement;Following commands  Orientation Level: Time     Following Commands: Follows one step commands consistently;Follows one step commands with increased time Safety/Judgement: Decreased awareness of safety;Decreased awareness of deficits             General Comments      Exercises     Assessment/Plan    PT Assessment Patient needs continued PT services  PT Problem List Decreased range of motion;Decreased strength;Decreased mobility;Decreased safety awareness;Decreased activity tolerance;Decreased balance;Decreased knowledge of use of DME;Decreased cognition       PT Treatment Interventions Gait training;Therapeutic exercise;Patient/family education;Stair training;Balance training;Functional mobility training;DME instruction;Therapeutic activities    PT Goals (Current goals can be found in the Care Plan section)  Acute Rehab PT Goals Patient Stated Goal: return home PT Goal Formulation: With patient Time For Goal Achievement: 03/23/18 Potential to Achieve Goals: Fair    Frequency Min 3X/week   Barriers to discharge Decreased caregiver support      Co-evaluation               AM-PAC PT "6 Clicks" Daily Activity  Outcome Measure Difficulty turning over in bed (including adjusting bedclothes, sheets and blankets)?: Unable Difficulty moving from lying on back to sitting on the side of the bed? : Unable Difficulty sitting down on and standing up from a chair with arms (e.g., wheelchair, bedside commode, etc,.)?: Unable Help needed moving to and from a bed to chair (including a wheelchair)?: A Lot Help needed walking in hospital room?: A Lot Help needed climbing 3-5 steps with a railing? : A Lot 6 Click Score: 9    End of Session Equipment Utilized During Treatment: Gait belt Activity Tolerance: Patient tolerated treatment well Patient left: in bed;with call bell/phone within reach;Other (comment)(with transporter) Nurse Communication: Mobility status PT Visit Diagnosis: Other abnormalities of gait and mobility (R26.89);Muscle weakness (generalized) (M62.81);Difficulty in walking, not elsewhere classified (R26.2)    Time: 1115-5208 PT Time Calculation (min) (ACUTE ONLY): 34 min   Charges:   PT Evaluation $PT  Eval Moderate Complexity: 1 Mod PT Treatments $Gait Training: 8-22 mins        Elwyn Reach, PT [phone]   [place] 03/09/2018, 11:32 AM

## 2018-03-10 ENCOUNTER — Observation Stay (HOSPITAL_COMMUNITY): Payer: Medicare Other

## 2018-03-10 ENCOUNTER — Other Ambulatory Visit: Payer: Self-pay

## 2018-03-10 ENCOUNTER — Encounter (HOSPITAL_COMMUNITY): Payer: Self-pay | Admitting: Nephrology

## 2018-03-10 DIAGNOSIS — E890 Postprocedural hypothyroidism: Secondary | ICD-10-CM | POA: Diagnosis present

## 2018-03-10 DIAGNOSIS — D631 Anemia in chronic kidney disease: Secondary | ICD-10-CM | POA: Diagnosis present

## 2018-03-10 DIAGNOSIS — G9341 Metabolic encephalopathy: Secondary | ICD-10-CM | POA: Diagnosis present

## 2018-03-10 DIAGNOSIS — R5383 Other fatigue: Secondary | ICD-10-CM | POA: Diagnosis not present

## 2018-03-10 DIAGNOSIS — F0280 Dementia in other diseases classified elsewhere without behavioral disturbance: Secondary | ICD-10-CM | POA: Diagnosis present

## 2018-03-10 DIAGNOSIS — M25552 Pain in left hip: Secondary | ICD-10-CM

## 2018-03-10 DIAGNOSIS — I959 Hypotension, unspecified: Secondary | ICD-10-CM | POA: Diagnosis not present

## 2018-03-10 DIAGNOSIS — G8929 Other chronic pain: Secondary | ICD-10-CM | POA: Diagnosis present

## 2018-03-10 DIAGNOSIS — I509 Heart failure, unspecified: Secondary | ICD-10-CM

## 2018-03-10 DIAGNOSIS — R410 Disorientation, unspecified: Secondary | ICD-10-CM | POA: Diagnosis not present

## 2018-03-10 DIAGNOSIS — N3000 Acute cystitis without hematuria: Secondary | ICD-10-CM | POA: Diagnosis present

## 2018-03-10 DIAGNOSIS — M160 Bilateral primary osteoarthritis of hip: Secondary | ICD-10-CM | POA: Diagnosis present

## 2018-03-10 DIAGNOSIS — E440 Moderate protein-calorie malnutrition: Secondary | ICD-10-CM | POA: Diagnosis present

## 2018-03-10 DIAGNOSIS — I5082 Biventricular heart failure: Secondary | ICD-10-CM | POA: Diagnosis present

## 2018-03-10 DIAGNOSIS — I5033 Acute on chronic diastolic (congestive) heart failure: Secondary | ICD-10-CM | POA: Diagnosis present

## 2018-03-10 DIAGNOSIS — R413 Other amnesia: Secondary | ICD-10-CM

## 2018-03-10 DIAGNOSIS — T502X5A Adverse effect of carbonic-anhydrase inhibitors, benzothiadiazides and other diuretics, initial encounter: Secondary | ICD-10-CM | POA: Diagnosis not present

## 2018-03-10 DIAGNOSIS — Z85038 Personal history of other malignant neoplasm of large intestine: Secondary | ICD-10-CM | POA: Diagnosis not present

## 2018-03-10 DIAGNOSIS — I130 Hypertensive heart and chronic kidney disease with heart failure and stage 1 through stage 4 chronic kidney disease, or unspecified chronic kidney disease: Secondary | ICD-10-CM | POA: Diagnosis present

## 2018-03-10 DIAGNOSIS — R262 Difficulty in walking, not elsewhere classified: Secondary | ICD-10-CM | POA: Diagnosis present

## 2018-03-10 DIAGNOSIS — R531 Weakness: Secondary | ICD-10-CM | POA: Diagnosis not present

## 2018-03-10 DIAGNOSIS — M25559 Pain in unspecified hip: Secondary | ICD-10-CM | POA: Diagnosis present

## 2018-03-10 DIAGNOSIS — Z79899 Other long term (current) drug therapy: Secondary | ICD-10-CM | POA: Diagnosis not present

## 2018-03-10 DIAGNOSIS — N183 Chronic kidney disease, stage 3 (moderate): Secondary | ICD-10-CM | POA: Diagnosis present

## 2018-03-10 DIAGNOSIS — I10 Essential (primary) hypertension: Secondary | ICD-10-CM | POA: Diagnosis not present

## 2018-03-10 DIAGNOSIS — R4182 Altered mental status, unspecified: Secondary | ICD-10-CM | POA: Diagnosis present

## 2018-03-10 DIAGNOSIS — Z8249 Family history of ischemic heart disease and other diseases of the circulatory system: Secondary | ICD-10-CM | POA: Diagnosis not present

## 2018-03-10 DIAGNOSIS — B952 Enterococcus as the cause of diseases classified elsewhere: Secondary | ICD-10-CM | POA: Diagnosis present

## 2018-03-10 DIAGNOSIS — M25551 Pain in right hip: Secondary | ICD-10-CM

## 2018-03-10 DIAGNOSIS — N179 Acute kidney failure, unspecified: Secondary | ICD-10-CM | POA: Diagnosis present

## 2018-03-10 DIAGNOSIS — Z79890 Hormone replacement therapy (postmenopausal): Secondary | ICD-10-CM | POA: Diagnosis not present

## 2018-03-10 DIAGNOSIS — Z880 Allergy status to penicillin: Secondary | ICD-10-CM | POA: Diagnosis not present

## 2018-03-10 LAB — ECHOCARDIOGRAM COMPLETE
HEIGHTINCHES: 66 in
WEIGHTICAEL: 2119.94 [oz_av]

## 2018-03-10 LAB — BASIC METABOLIC PANEL
ANION GAP: 8 (ref 5–15)
BUN: 15 mg/dL (ref 8–23)
CALCIUM: 8.9 mg/dL (ref 8.9–10.3)
CO2: 29 mmol/L (ref 22–32)
CREATININE: 1.05 mg/dL — AB (ref 0.44–1.00)
Chloride: 96 mmol/L — ABNORMAL LOW (ref 98–111)
GFR calc Af Amer: 56 mL/min — ABNORMAL LOW (ref >60)
GFR, EST NON AFRICAN AMERICAN: 48 mL/min — AB (ref >60)
Glucose, Bld: 83 mg/dL (ref 70–99)
Potassium: 3.3 mmol/L — ABNORMAL LOW (ref 3.5–5.1)
SODIUM: 133 mmol/L — AB (ref 135–145)

## 2018-03-10 LAB — CBC
HCT: 28.4 % — ABNORMAL LOW (ref 36.0–46.0)
HEMOGLOBIN: 9.6 g/dL — AB (ref 12.0–15.0)
MCH: 31.9 pg (ref 26.0–34.0)
MCHC: 33.8 g/dL (ref 30.0–36.0)
MCV: 94.4 fL (ref 78.0–100.0)
PLATELETS: 213 10*3/uL (ref 150–400)
RBC: 3.01 MIL/uL — AB (ref 3.87–5.11)
RDW: 12.2 % (ref 11.5–15.5)
WBC: 6.8 10*3/uL (ref 4.0–10.5)

## 2018-03-10 LAB — GLUCOSE, CAPILLARY: GLUCOSE-CAPILLARY: 106 mg/dL — AB (ref 70–99)

## 2018-03-10 MED ORDER — SODIUM CHLORIDE 0.9 % IV SOLN: INTRAVENOUS | Status: DC

## 2018-03-10 MED ORDER — CIPROFLOXACIN HCL 500 MG PO TABS
250.0000 mg | ORAL_TABLET | Freq: Two times a day (BID) | ORAL | Status: DC
  Administered 2018-03-10 – 2018-03-11 (×2): 250 mg via ORAL
  Filled 2018-03-10 (×2): qty 1

## 2018-03-10 MED ORDER — HALOPERIDOL LACTATE 5 MG/ML IJ SOLN
1.0000 mg | Freq: Once | INTRAMUSCULAR | Status: AC
  Administered 2018-03-10: 1 mg via INTRAVENOUS
  Filled 2018-03-10: qty 1

## 2018-03-10 MED ORDER — POLYVINYL ALCOHOL 1.4 % OP SOLN
1.0000 [drp] | OPHTHALMIC | Status: DC | PRN
  Administered 2018-03-10: 1 [drp] via OPHTHALMIC
  Filled 2018-03-10: qty 15

## 2018-03-10 MED ORDER — SODIUM CHLORIDE 0.9 % IV SOLN
INTRAVENOUS | Status: DC | PRN
  Administered 2018-03-10: 250 mL via INTRAVENOUS

## 2018-03-10 NOTE — Progress Notes (Signed)
  Echocardiogram 2D Echocardiogram has been performed.  Madelaine Etienne 03/10/2018, 3:22 PM

## 2018-03-10 NOTE — Progress Notes (Signed)
Pharmacy Antibiotic Note  Regina [name] is a 84 y.o. female  with UTI.  Pharmacy has been consulted for Cipro dosing (day 2 of antibiotics). -WBC= 6.8, afebrile, urine cultures w/ unidentified organism (colony count 60K) -SCr= 1.05, CrCl ~ 40  Plan: -Change cipro to 250mg  po q12h -Could consider total of 3 days treatment -Will sign off. Please contact pharmacy with any other needs.  Thank you Hildred Laser, PharmD Clinical Pharmacist Please check Amion for pharmacy contact number

## 2018-03-10 NOTE — Progress Notes (Addendum)
[facility] Progress Note  Subjective: pt today is more confused, she is repeating aloud what people are saying in the room.  1.3L uop yesterday, 650 today so far, creat declining.    Vitals:   03/09/18 2206 03/10/18 0433 03/10/18 0559 03/10/18 1126  BP: (!) 111/43 (!) 119/39 (!) 141/50 (!) 148/46  Pulse: 70 72 70 73  Resp: 18 16  18   Temp: 98.6 F (37 C) 98 F (36.7 C)  98 F (36.7 C)  TempSrc: Oral Oral  Oral  SpO2: 98% 98% 98% 98%  Weight:  60.1 kg (132 lb 7.9 oz)    Height:        Inpatient medications: . calcitRIOL  0.25 mcg Oral Daily  . carvedilol  6.25 mg Oral BID WC  . ciprofloxacin  250 mg Oral BID  . feeding supplement (ENSURE ENLIVE)  237 mL Oral BID BM  . furosemide  20 mg Intravenous Q12H  . levothyroxine  88 mcg Oral QAC breakfast  . pantoprazole  40 mg Oral Daily   . sodium chloride 250 mL (03/10/18 0619)   sodium chloride, acetaminophen **OR** acetaminophen, hydrALAZINE, ondansetron **OR** ondansetron (ZOFRAN) IV  Exam:  awake, confused, repeating words that she hears multiple times  no focal deficits, up in chair  no jvd  Chest cta bilat  Cor reg no mrg  Abd soft ntnd  Ext 2+ edema improving, wrinkles ^'d bilat LE's  NF, Ox 3, strange behavior as above   Brief Summary:  Chief Complaint: Difficulty walking HPI: Regina [name] is a 84 y.o. female with history of diastolic CHF, hypertension, hypothyroidism, colon cancer status post resection in remission was brought to the ER after patient was found to have increasing difficulty ambulating with imbalance.  Patient's family states that they were in beats over the last 1 week patient has not come out of her room because of the difficulty walking.  Prior to going to the beach patient had a epidural injection in the lumbar area for chronic low back pain.  Since then patient has been gradually worsening over the last 2 weeks.  Increasing weakness difficult ambulating.  Denies any fever chills or  incontinence of urine or bowels.  Denies any weakness of the upper extremities.  When patient tries to walk she feels very weak and her legs give away.  Denies any chest pain shortness of breath nausea vomiting diarrhea.patient has been having poor appetite.  Over the last few days as per the patient's husband was at the bedside patient has become increasingly confused and has taken more than her usual dose of Lasix and some of her other medications to.  ED Course: In the ER her creatinine is found to be around 1.3 the last one in our system was in 2015 which was normal.  Patient states she also was recently started on calcitriol by her physician 3 months ago.  Hemoglobin is stable at baseline.  On exam patient has bilateral lower extremity edema with erythema of the both lower extremity which patient's family states is chronic but has increased.  Patient was not orthostatic.  Patient states he has bilateral hip pain when she tries to walk in addition to the weakness.  Patient has good deep tendon reflexes on exam.  UA showing possibility of UTI.  Patient complains of bilateral flank pain.     home meds:  - coreg 6.25 bid/ telmisartan 80 qd/ lasix 40 qd  - synthroid 88 ug qd/ nexium 40 qd/ rocaltrol  Impression/ Plan:  1) Generalized weakness/ difficulty ambulating: suspect multifactorial, UTI and acute CHF w/ lower ext edema. Not much stronger yet. PT eval - MRI C-spine and L-spine show some disease but no severe spinal stenosis   2) Altered mental status: consistent w/ acute delirium, prob due to either UTI, acute CHF, dislocation (recent beach trip x 1 wk) and/ or underlying dementia - treating UTI w cipro - treating acute CHF w/ IV lasix, improving on exam - spoke w/ neurology, it is difficult to differentiate dementia from delirium if there is not history of either as in this case; best to treat acute conditions then reassess; dementia is best diagnosed in OP dementia clinic ([place])  generally speaking - neuro recommends >> cont treating acute conditions, allow 3 days approx for improvement; if delirium not improving by 7/31, call them back (Dr Cheral Marker) - MRI brain is negative - TSH and B12 are within normal limits - husband yesterday when asked did report history of some memory problems lately though wouldn't specify times or give details; today daughter states she has been found a number of times standing in the kitchen while others are working around her and saying "I don't know what I'm supposed to be doing". Suspect she has a developing dementia underlying.    3) Acute renal failure: creat up 1.38 >> down to 1.05 w/ diuresis, c/w Starling curve effect.  No recent labs to compare - cont to hold ARB, can prob resume soon if needed - cont diuresing IV lasix 20 bid  4) Bilateral hip pain: plain film pelvis xray w/o fracture   5) Hypothyroidism on Synthroid.  6) Chronic diastolic CHF: last EF measured 2015 was 55- 60% w G1DD  7) Hypertension: - cont on Coreg, cont holding ARB due to increased creatinine  8) Chronic anemia -we will check anemia panel.  Follow CBC.  9) Possible UTI on Cipro. Urine Cx growing 60K of some organism.     DVT prophylaxis: Lovenox. Code Status: Full code. Family Communication: Patient's daughter is here today, discussed at length Disposition Plan: To be determined. Consults called: Physical therapy. Admission status: Observation.       Kelly Splinter MD [facility] pgr [phone] 03/10/2018, 2:48 PM   Recent Labs  Lab 03/08/18 2331 03/09/18 0745 03/10/18 0531  NA 129* 134* 133*  K 3.8 3.5 3.3*  CL 92* 95* 96*  CO2 26 27 29   GLUCOSE 112* 85 83  BUN 25* 18 15  CREATININE 1.38* 1.15* 1.05*  CALCIUM 9.4 9.2 8.9   Recent Labs  Lab 03/08/18 2331 03/09/18 0745  AST 31 29  ALT 21 19  ALKPHOS 63 54  BILITOT 1.0 1.2  PROT 6.4* 5.6*  ALBUMIN 3.7 3.2*   Recent Labs  Lab 03/08/18 2331 03/09/18 0745  03/10/18 0531  WBC 7.6 6.5 6.8  NEUTROABS  --  4.2  --   HGB 10.7* 10.1* 9.6*  HCT 31.1* 29.4* 28.4*  MCV 92.6 93.6 94.4  PLT 225 229 213   Iron/TIBC/Ferritin/ %Sat    Component Value Date/Time   IRON 43 03/09/2018 0745   TIBC 290 03/09/2018 0745   FERRITIN 145 03/09/2018 0745   IRONPCTSAT 15 03/09/2018 0745

## 2018-03-11 DIAGNOSIS — R410 Disorientation, unspecified: Secondary | ICD-10-CM

## 2018-03-11 DIAGNOSIS — N3000 Acute cystitis without hematuria: Principal | ICD-10-CM

## 2018-03-11 LAB — BASIC METABOLIC PANEL
Anion gap: 11 (ref 5–15)
BUN: 17 mg/dL (ref 8–23)
CALCIUM: 9.6 mg/dL (ref 8.9–10.3)
CO2: 29 mmol/L (ref 22–32)
Chloride: 92 mmol/L — ABNORMAL LOW (ref 98–111)
Creatinine, Ser: 1.08 mg/dL — ABNORMAL HIGH (ref 0.44–1.00)
GFR calc Af Amer: 54 mL/min — ABNORMAL LOW (ref >60)
GFR calc non Af Amer: 46 mL/min — ABNORMAL LOW (ref >60)
Glucose, Bld: 129 mg/dL — ABNORMAL HIGH (ref 70–99)
Potassium: 3.9 mmol/L (ref 3.5–5.1)
Sodium: 132 mmol/L — ABNORMAL LOW (ref 135–145)

## 2018-03-11 LAB — URINE CULTURE: Culture: 60000 — AB

## 2018-03-11 MED ORDER — ENOXAPARIN SODIUM 30 MG/0.3ML ~~LOC~~ SOLN
30.0000 mg | SUBCUTANEOUS | Status: DC
  Administered 2018-03-11 – 2018-03-12 (×2): 30 mg via SUBCUTANEOUS
  Filled 2018-03-11 (×2): qty 0.3

## 2018-03-11 MED ORDER — ADULT MULTIVITAMIN W/MINERALS CH
1.0000 | ORAL_TABLET | Freq: Every day | ORAL | Status: DC
  Administered 2018-03-11 – 2018-03-13 (×3): 1 via ORAL
  Filled 2018-03-11 (×3): qty 1

## 2018-03-11 MED ORDER — CIPROFLOXACIN HCL 500 MG PO TABS
250.0000 mg | ORAL_TABLET | Freq: Two times a day (BID) | ORAL | Status: DC
  Administered 2018-03-11 – 2018-03-12 (×2): 250 mg via ORAL
  Filled 2018-03-11 (×2): qty 1

## 2018-03-11 NOTE — Progress Notes (Signed)
PROGRESS NOTE                                                                                                                                                                                                             Patient Demographics:    Regina [name], is a 84 y.o. female, DOB - [birth year], ZOX:[id number]  Admit date - 03/08/2018   Admitting Physician Rise Patience, MD  Outpatient Primary MD for the patient is [name], MD  LOS - 1   Chief Complaint  Patient presents with  . Altered Mental Status       Brief Narrative   Leathie Weich Rayis a 84 y.o.femalewithhistory of diastolic CHF, hypertension, hypothyroidism, colon cancer status post resection in remission was brought to the ER after patient was found to have increasing difficulty ambulating with imbalance.   As well she did have confusion, and significant delirium during hospital stay, her workup was significant for acute on chronic CHF, and UTI, mentation significantly improved with UTI treatment.      Subjective:    [name] today has, No headache, No chest pain, No abdominal pain - No Nausea,  No Cough - SOB.    Assessment  & Plan :    Principal Problem:   Delirium Active Problems:   Chronic diastolic heart failure (HCC)   Essential hypertension   Hypothyroidism   Ataxia   Weakness generalized   Acute renal failure superimposed on chronic kidney disease (HCC)   Weakness   Malnutrition of moderate degree   Acute on chronic diastolic CHF (congestive heart failure) (HCC)   Hip pain   Acute cystitis without hematuria   Memory loss   Generalized weakness/ difficulty ambulating:  - suspect multifactorial, UTI and acute CHF w/ lower ext edema. Not much stronger yet. PT eval - MRI C-spine and L-spine show some disease but no severe spinal stenosis   Altered mental status:  - consistent w/ acute delirium, prob due to either UTI, acute CHF, dislocation (recent beach  trip x 1 wk) and/ or underlying dementia -  MRI brain with no acute findings, mentation significantly improved today with treatment of her UTI. - treating UTI w cipro - treating acute CHF w/ IV lasix, improving on exam - MRI brain is negative - TSH and B12 are within normal limits   Acute  renal failure:  - creat up 1.38 >> down to 1.05 w/ diuresis, c/w Starling curve effect.  No recent labs to compare - cont to hold ARB, can prob resume soon if needed - cont diuresing IV lasix 20 bid  Bilateral hip pain:  - most likely due to arthritis,plain film pelvis xray w/o fracture    Hypothyroidism  -on Synthroid.  Chronic diastolic CHF: -  last EF measured 2015 was 55- 60% w G1DD  Hypertension: - cont on Coreg, cont holding ARB due to increased creatinine  Chronic anemia-we will check anemia panel. Follow CBC.  UTI -  Urine culture growing Enterococcus, she has allergy to penicillin, so will continue with Cipro for now.     Code Status :  Full code  Family Communication  :  Daughter at bedside  Disposition Plan  :  SNF placement per PT  Consults  :  none  Procedures  : none  DVT Prophylaxis  :    Lab Results  Component Value Date   PLT 213 03/10/2018    Antibiotics  :    Anti-infectives (From admission, onward)   Start     Dose/Rate Route Frequency Ordered Stop   03/10/18 2000  ciprofloxacin (CIPRO) tablet 250 mg  Status:  Discontinued     250 mg Oral 2 times daily 03/10/18 0916 03/11/18 1424   03/10/18 0600  ciprofloxacin (CIPRO) IVPB 200 mg  Status:  Discontinued     200 mg 100 mL/hr over 60 Minutes Intravenous Every 24 hours 03/09/18 0626 03/10/18 0916   03/09/18 0630  ciprofloxacin (CIPRO) IVPB 200 mg     200 mg 100 mL/hr over 60 Minutes Intravenous  Once 03/09/18 0626 03/09/18 0928        Objective:   Vitals:   03/10/18 1126 03/10/18 1954 03/11/18 0413 03/11/18 1200  BP: (!) 148/46 (!) 150/57 (!) 154/57 (!) 140/53  Pulse: 73 79 80 72    Resp: 18 18 18 20   Temp: 98 F (36.7 C) 97.6 F (36.4 C) 98 F (36.7 C) 98.4 F (36.9 C)  TempSrc: Oral Oral Oral Oral  SpO2: 98% 98% 98% 99%  Weight:   57.2 kg (126 lb 1.6 oz)   Height:        Wt Readings from Last 3 Encounters:  03/11/18 57.2 kg (126 lb 1.6 oz)  09/06/14 65.5 kg (144 lb 6.4 oz)  08/03/14 65.3 kg (144 lb)     Intake/Output Summary (Last 24 hours) at 03/11/2018 1438 Last data filed at 03/11/2018 1300 Gross per 24 hour  Intake 332.01 ml  Output 1000 ml  Net -667.99 ml     Physical Exam  Awake Alert, Oriented X 3, appears to be with improvement cell status, answering question appropriately, but still has some repetition. Symmetrical Chest wall movement, Good air movement bilaterally, CTAB RRR,No Gallops,Rubs or new Murmurs, No Parasternal Heave +ve B.Sounds, Abd Soft, No tenderness,  No rebound - guarding or rigidity. No Cyanosis, Clubbing or edema, No new Rash or bruise      Data Review:    CBC Recent Labs  Lab 03/08/18 2331 03/09/18 0745 03/10/18 0531  WBC 7.6 6.5 6.8  HGB 10.7* 10.1* 9.6*  HCT 31.1* 29.4* 28.4*  PLT 225 229 213  MCV 92.6 93.6 94.4  MCH 31.8 32.2 31.9  MCHC 34.4 34.4 33.8  RDW 11.8 12.1 12.2  LYMPHSABS  --  1.4  --   MONOABS  --  0.7  --   EOSABS  --  0.1  --   BASOSABS  --  0.0  --     Chemistries  Recent Labs  Lab 03/08/18 2331 03/09/18 0745 03/10/18 0531 03/11/18 0610  NA 129* 134* 133* 132*  K 3.8 3.5 3.3* 3.9  CL 92* 95* 96* 92*  CO2 26 27 29 29   GLUCOSE 112* 85 83 129*  BUN 25* 18 15 17   CREATININE 1.38* 1.15* 1.05* 1.08*  CALCIUM 9.4 9.2 8.9 9.6  AST 31 29  --   --   ALT 21 19  --   --   ALKPHOS 63 54  --   --   BILITOT 1.0 1.2  --   --    ------------------------------------------------------------------------------------------------------------------ No results for input(s): CHOL, HDL, LDLCALC, TRIG, CHOLHDL, LDLDIRECT in the last 72 hours.  No results found for:  HGBA1C ------------------------------------------------------------------------------------------------------------------ Recent Labs    03/09/18 0745  TSH 4.398   ------------------------------------------------------------------------------------------------------------------ Recent Labs    03/09/18 0745  VITAMINB12 487  FOLATE 23.5  FERRITIN 145  TIBC 290  IRON 43  RETICCTPCT 1.3    Coagulation profile No results for input(s): INR, PROTIME in the last 168 hours.  No results for input(s): DDIMER in the last 72 hours.  Cardiac Enzymes Recent Labs  Lab 03/09/18 0245 03/09/18 0745  TROPONINI <0.03 <0.03   ------------------------------------------------------------------------------------------------------------------ No results found for: BNP  Inpatient Medications  Scheduled Meds: . calcitRIOL  0.25 mcg Oral Daily  . carvedilol  6.25 mg Oral BID WC  . feeding supplement (ENSURE ENLIVE)  237 mL Oral BID BM  . furosemide  20 mg Intravenous Q12H  . levothyroxine  88 mcg Oral QAC breakfast  . multivitamin with minerals  1 tablet Oral Daily  . pantoprazole  40 mg Oral Daily   Continuous Infusions: . sodium chloride 250 mL (03/10/18 0619)   PRN Meds:.sodium chloride, acetaminophen **OR** acetaminophen, hydrALAZINE, ondansetron **OR** ondansetron (ZOFRAN) IV, polyvinyl alcohol  Micro Results Recent Results (from the past 240 hour(s))  Culture, Urine     Status: Abnormal   Collection Time: 03/09/18  4:40 AM  Result Value Ref Range Status   Specimen Description URINE, RANDOM  Final   Special Requests   Final    NONE Performed at [facility], [place] [address], [place], [place] [address]    Culture 60,000 COLONIES/mL ENTEROCOCCUS FAECALIS (A)  Final   Report Status 03/11/2018 FINAL  Final   Organism ID, Bacteria ENTEROCOCCUS FAECALIS (A)  Final      Susceptibility   Enterococcus faecalis - MIC*    AMPICILLIN <=2 SENSITIVE Sensitive     LEVOFLOXACIN 1  SENSITIVE Sensitive     NITROFURANTOIN <=16 SENSITIVE Sensitive     VANCOMYCIN 1 SENSITIVE Sensitive     * 60,000 COLONIES/mL ENTEROCOCCUS FAECALIS    Radiology Reports Dg Pelvis 1-2 Views  Result Date: 03/09/2018 CLINICAL DATA:  Bilateral hip pain. EXAM: PELVIS - 1-2 VIEW COMPARISON:  None. FINDINGS: The cortical margins of the bony pelvis are intact. No fracture. Pubic symphysis and sacroiliac joints are congruent. Both femoral heads are well-seated in the respective acetabula. The joint spaces are preserved. Degenerative disc disease at L4-L5, partially included. IMPRESSION: 1. No acute abnormality. 2. L4-L5 degenerative change, partially included. Electronically Signed   By: Jeb Levering M.D.   On: 03/09/2018 05:47   Mr Brain Wo Contrast  Result Date: 03/09/2018 CLINICAL DATA:  Initial evaluation for abnormal gait, neck pain, generalized weakness. EXAM: MRI HEAD WITHOUT CONTRAST MRI CERVICAL AND LUMBAR SPINE WITHOUT  CONTRAST TECHNIQUE: Multiplanar and multiecho pulse sequences of the cervical spine, to include the craniocervical junction and cervicothoracic junction, and lumbar spine, were obtained without intravenous contrast. COMPARISON:  None available. FINDINGS: MRI HEAD FINDINGS Brain: Cerebral volume within normal limits for patient age. Minimal T2/FLAIR hyperintensity within the periventricular white matter, nonspecific, but felt to be within normal limits for age. No abnormal foci of restricted diffusion to suggest acute or subacute ischemia. Gray-white matter differentiation well maintained. No encephalomalacia to suggest chronic infarction. No foci of susceptibility artifact to suggest acute or chronic intracranial hemorrhage. Mass lesion, midline shift or mass effect. No hydrocephalus. No extra-axial fluid collection. Major dural sinuses are grossly patent. Pituitary gland and suprasellar region are normal. Midline structures intact and normal. Vascular: Major intracranial vascular  flow voids well maintained and normal in appearance. Skull and upper cervical spine: Craniocervical junction normal. Visualized upper cervical spine within normal limits. Bone marrow signal intensity normal. No scalp soft tissue abnormality. Sinuses/Orbits: Globes and orbital soft tissues demonstrate no acute finding. Patient status post ocular lens replacement bilaterally. Mucous retention cyst noted within the right maxillary sinus. Paranasal sinuses are otherwise largely clear. No significant mastoid effusion. Inner ear structures grossly normal. Other: None. MRI CERVICAL SPINE FINDINGS Alignment: Exaggeration of the normal cervical lordosis. No listhesis. Vertebrae: Vertebral body heights maintained bone marrow signal intensity within normal limits. No discrete or worrisome osseous lesions. Mild reactive endplate changes present about the C4-5 through C6-7 interspaces. No other abnormal marrow edema. Cord: Signal intensity within the cervical spinal cord within normal limits. Posterior Fossa, vertebral arteries, paraspinal tissues: Craniocervical junction within normal limits. Paraspinous and prevertebral soft tissues normal. Normal intravascular flow voids present within the vertebral arteries bilaterally. Disc levels: C2-C3: Unremarkable. C3-C4: Shallow central disc protrusion indents the ventral thecal sac, contacting the ventral cord without cord deformity. Mild spinal stenosis. Bilateral uncovertebral hypertrophy with resultant mild to moderate right greater than left neural foraminal narrowing. C4-C5: Chronic intervertebral disc space narrowing with diffuse degenerative disc osteophyte. Broad posterior component flattens and effaces the ventral thecal sac. Facet and ligament flavum hypertrophy. Resultant moderate spinal stenosis without significant cord deformity. Moderate bilateral C5 foraminal narrowing. C5-C6: Diffuse degenerative disc osteophyte with intervertebral disc space narrowing. Ligamentum  flavum hypertrophy. Moderate spinal stenosis without cord deformity. Moderate to severe bilateral C6 foraminal narrowing. C6-C7: Diffuse degenerative disc osteophyte. Flattening and effacement of the ventral thecal sac. Ligamentum flavum thickening. Mild to moderate spinal stenosis. Moderate bilateral C7 foraminal narrowing. C7-T1:  Facet hypertrophy.  No significant stenosis. Visualized upper thoracic spine within normal limits. MRI LUMBAR SPINE FINDINGS Segmentation: Normal segmentation. Lowest well-formed disc labeled the L5-S1 level. Alignment: Extensive thoracolumbar dextroscoliosis. Trace retrolisthesis o T12 on L1 and L1 on L2. Vertebrae: Vertebral body heights maintained without evidence for acute or chronic fracture. Bone marrow signal intensity within normal limits. No discrete or worrisome osseous lesions. Reactive endplate changes present about the L1-2 and L2-3 interspaces related to scoliotic curvature. Conus medullaris and cauda equina: Conus extends to the L1 level. Conus and cauda equina appear normal. Paraspinal and other soft tissues: Mild edema within the lower left paraspinous soft tissues (series 17, image 18), of uncertain significance, but could reflect muscular strain/injury. Possible myositis could also be considered. Paraspinous soft tissues demonstrate no other acute abnormality. Visualized visceral structures unremarkable. Disc levels: L1-2: Mild diffuse disc bulge. Left far lateral reactive endplate changes with endplate osteophytic spurring. Mild facet hypertrophy. No significant stenosis. L2-3: Mild diffuse disc bulge. Left far lateral reactive  endplate changes with endplate osteophytic spurring. Mild to moderate facet hypertrophy. Borderline mild left lateral recess narrowing without significant spinal stenosis. Mild left L2 foraminal narrowing. L3-4: Mild diffuse disc bulge with disc desiccation. Disc bulging slightly asymmetric to the right. Moderate facet and ligament flavum  hypertrophy. Resultant mild right lateral recess narrowing without significant spinal stenosis. Foramina are patent. L4-5: Minimal disc bulge. Right far lateral reactive endplate changes. Moderate facet hypertrophy. Resultant mild to moderate right lateral recess narrowing without spinal stenosis. Mild right L4 foraminal narrowing. L5-S1: Diffuse disc bulge with intervertebral disc space narrowing. Chronic reactive endplate changes on the right. Bilateral facet hypertrophy. No significant stenosis or impingement. IMPRESSION: MRI HEAD IMPRESSION: Normal brain MRI for age. No acute intracranial abnormality identified. MRI CERVICAL SPINE IMPRESSION: 1. No acute abnormality within the cervical spine. 2. Moderate multilevel cervical spondylolysis with resultant mild to moderate diffuse spinal stenosis at C3-4 through C6-7, most pronounced at C5-6. 3. Multifactorial degenerative changes with resultant multilevel foraminal narrowing as above. Notable findings include moderate bilateral C4, C5, and C7 foraminal narrowing, with moderate to severe bilateral C6 foraminal stenosis. MRI LUMBAR SPINE IMPRESSION: 1. No acute abnormality within the lumbar spine. 2. Mild multilevel degenerative disc disease and facet hypertrophy for age without significant spinal stenosis. Mild lateral recess narrowing on the left at L2-3 and on the right at L3-4 and L4-5. 3. Pronounced thoracolumbar dextroscoliosis. Electronically Signed   By: Jeannine Boga M.D.   On: 03/09/2018 20:39   Mr Cervical Spine Wo Contrast  Result Date: 03/09/2018 CLINICAL DATA:  Initial evaluation for abnormal gait, neck pain, generalized weakness. EXAM: MRI HEAD WITHOUT CONTRAST MRI CERVICAL AND LUMBAR SPINE WITHOUT CONTRAST TECHNIQUE: Multiplanar and multiecho pulse sequences of the cervical spine, to include the craniocervical junction and cervicothoracic junction, and lumbar spine, were obtained without intravenous contrast. COMPARISON:  None available.  FINDINGS: MRI HEAD FINDINGS Brain: Cerebral volume within normal limits for patient age. Minimal T2/FLAIR hyperintensity within the periventricular white matter, nonspecific, but felt to be within normal limits for age. No abnormal foci of restricted diffusion to suggest acute or subacute ischemia. Gray-white matter differentiation well maintained. No encephalomalacia to suggest chronic infarction. No foci of susceptibility artifact to suggest acute or chronic intracranial hemorrhage. Mass lesion, midline shift or mass effect. No hydrocephalus. No extra-axial fluid collection. Major dural sinuses are grossly patent. Pituitary gland and suprasellar region are normal. Midline structures intact and normal. Vascular: Major intracranial vascular flow voids well maintained and normal in appearance. Skull and upper cervical spine: Craniocervical junction normal. Visualized upper cervical spine within normal limits. Bone marrow signal intensity normal. No scalp soft tissue abnormality. Sinuses/Orbits: Globes and orbital soft tissues demonstrate no acute finding. Patient status post ocular lens replacement bilaterally. Mucous retention cyst noted within the right maxillary sinus. Paranasal sinuses are otherwise largely clear. No significant mastoid effusion. Inner ear structures grossly normal. Other: None. MRI CERVICAL SPINE FINDINGS Alignment: Exaggeration of the normal cervical lordosis. No listhesis. Vertebrae: Vertebral body heights maintained bone marrow signal intensity within normal limits. No discrete or worrisome osseous lesions. Mild reactive endplate changes present about the C4-5 through C6-7 interspaces. No other abnormal marrow edema. Cord: Signal intensity within the cervical spinal cord within normal limits. Posterior Fossa, vertebral arteries, paraspinal tissues: Craniocervical junction within normal limits. Paraspinous and prevertebral soft tissues normal. Normal intravascular flow voids present within the  vertebral arteries bilaterally. Disc levels: C2-C3: Unremarkable. C3-C4: Shallow central disc protrusion indents the ventral thecal sac, contacting the ventral cord  without cord deformity. Mild spinal stenosis. Bilateral uncovertebral hypertrophy with resultant mild to moderate right greater than left neural foraminal narrowing. C4-C5: Chronic intervertebral disc space narrowing with diffuse degenerative disc osteophyte. Broad posterior component flattens and effaces the ventral thecal sac. Facet and ligament flavum hypertrophy. Resultant moderate spinal stenosis without significant cord deformity. Moderate bilateral C5 foraminal narrowing. C5-C6: Diffuse degenerative disc osteophyte with intervertebral disc space narrowing. Ligamentum flavum hypertrophy. Moderate spinal stenosis without cord deformity. Moderate to severe bilateral C6 foraminal narrowing. C6-C7: Diffuse degenerative disc osteophyte. Flattening and effacement of the ventral thecal sac. Ligamentum flavum thickening. Mild to moderate spinal stenosis. Moderate bilateral C7 foraminal narrowing. C7-T1:  Facet hypertrophy.  No significant stenosis. Visualized upper thoracic spine within normal limits. MRI LUMBAR SPINE FINDINGS Segmentation: Normal segmentation. Lowest well-formed disc labeled the L5-S1 level. Alignment: Extensive thoracolumbar dextroscoliosis. Trace retrolisthesis o T12 on L1 and L1 on L2. Vertebrae: Vertebral body heights maintained without evidence for acute or chronic fracture. Bone marrow signal intensity within normal limits. No discrete or worrisome osseous lesions. Reactive endplate changes present about the L1-2 and L2-3 interspaces related to scoliotic curvature. Conus medullaris and cauda equina: Conus extends to the L1 level. Conus and cauda equina appear normal. Paraspinal and other soft tissues: Mild edema within the lower left paraspinous soft tissues (series 17, image 18), of uncertain significance, but could reflect  muscular strain/injury. Possible myositis could also be considered. Paraspinous soft tissues demonstrate no other acute abnormality. Visualized visceral structures unremarkable. Disc levels: L1-2: Mild diffuse disc bulge. Left far lateral reactive endplate changes with endplate osteophytic spurring. Mild facet hypertrophy. No significant stenosis. L2-3: Mild diffuse disc bulge. Left far lateral reactive endplate changes with endplate osteophytic spurring. Mild to moderate facet hypertrophy. Borderline mild left lateral recess narrowing without significant spinal stenosis. Mild left L2 foraminal narrowing. L3-4: Mild diffuse disc bulge with disc desiccation. Disc bulging slightly asymmetric to the right. Moderate facet and ligament flavum hypertrophy. Resultant mild right lateral recess narrowing without significant spinal stenosis. Foramina are patent. L4-5: Minimal disc bulge. Right far lateral reactive endplate changes. Moderate facet hypertrophy. Resultant mild to moderate right lateral recess narrowing without spinal stenosis. Mild right L4 foraminal narrowing. L5-S1: Diffuse disc bulge with intervertebral disc space narrowing. Chronic reactive endplate changes on the right. Bilateral facet hypertrophy. No significant stenosis or impingement. IMPRESSION: MRI HEAD IMPRESSION: Normal brain MRI for age. No acute intracranial abnormality identified. MRI CERVICAL SPINE IMPRESSION: 1. No acute abnormality within the cervical spine. 2. Moderate multilevel cervical spondylolysis with resultant mild to moderate diffuse spinal stenosis at C3-4 through C6-7, most pronounced at C5-6. 3. Multifactorial degenerative changes with resultant multilevel foraminal narrowing as above. Notable findings include moderate bilateral C4, C5, and C7 foraminal narrowing, with moderate to severe bilateral C6 foraminal stenosis. MRI LUMBAR SPINE IMPRESSION: 1. No acute abnormality within the lumbar spine. 2. Mild multilevel degenerative disc  disease and facet hypertrophy for age without significant spinal stenosis. Mild lateral recess narrowing on the left at L2-3 and on the right at L3-4 and L4-5. 3. Pronounced thoracolumbar dextroscoliosis. Electronically Signed   By: Jeannine Boga M.D.   On: 03/09/2018 20:39   Mr Lumbar Spine Wo Contrast  Result Date: 03/09/2018 CLINICAL DATA:  Initial evaluation for abnormal gait, neck pain, generalized weakness. EXAM: MRI HEAD WITHOUT CONTRAST MRI CERVICAL AND LUMBAR SPINE WITHOUT CONTRAST TECHNIQUE: Multiplanar and multiecho pulse sequences of the cervical spine, to include the craniocervical junction and cervicothoracic junction, and lumbar spine, were obtained without  intravenous contrast. COMPARISON:  None available. FINDINGS: MRI HEAD FINDINGS Brain: Cerebral volume within normal limits for patient age. Minimal T2/FLAIR hyperintensity within the periventricular white matter, nonspecific, but felt to be within normal limits for age. No abnormal foci of restricted diffusion to suggest acute or subacute ischemia. Gray-white matter differentiation well maintained. No encephalomalacia to suggest chronic infarction. No foci of susceptibility artifact to suggest acute or chronic intracranial hemorrhage. Mass lesion, midline shift or mass effect. No hydrocephalus. No extra-axial fluid collection. Major dural sinuses are grossly patent. Pituitary gland and suprasellar region are normal. Midline structures intact and normal. Vascular: Major intracranial vascular flow voids well maintained and normal in appearance. Skull and upper cervical spine: Craniocervical junction normal. Visualized upper cervical spine within normal limits. Bone marrow signal intensity normal. No scalp soft tissue abnormality. Sinuses/Orbits: Globes and orbital soft tissues demonstrate no acute finding. Patient status post ocular lens replacement bilaterally. Mucous retention cyst noted within the right maxillary sinus. Paranasal  sinuses are otherwise largely clear. No significant mastoid effusion. Inner ear structures grossly normal. Other: None. MRI CERVICAL SPINE FINDINGS Alignment: Exaggeration of the normal cervical lordosis. No listhesis. Vertebrae: Vertebral body heights maintained bone marrow signal intensity within normal limits. No discrete or worrisome osseous lesions. Mild reactive endplate changes present about the C4-5 through C6-7 interspaces. No other abnormal marrow edema. Cord: Signal intensity within the cervical spinal cord within normal limits. Posterior Fossa, vertebral arteries, paraspinal tissues: Craniocervical junction within normal limits. Paraspinous and prevertebral soft tissues normal. Normal intravascular flow voids present within the vertebral arteries bilaterally. Disc levels: C2-C3: Unremarkable. C3-C4: Shallow central disc protrusion indents the ventral thecal sac, contacting the ventral cord without cord deformity. Mild spinal stenosis. Bilateral uncovertebral hypertrophy with resultant mild to moderate right greater than left neural foraminal narrowing. C4-C5: Chronic intervertebral disc space narrowing with diffuse degenerative disc osteophyte. Broad posterior component flattens and effaces the ventral thecal sac. Facet and ligament flavum hypertrophy. Resultant moderate spinal stenosis without significant cord deformity. Moderate bilateral C5 foraminal narrowing. C5-C6: Diffuse degenerative disc osteophyte with intervertebral disc space narrowing. Ligamentum flavum hypertrophy. Moderate spinal stenosis without cord deformity. Moderate to severe bilateral C6 foraminal narrowing. C6-C7: Diffuse degenerative disc osteophyte. Flattening and effacement of the ventral thecal sac. Ligamentum flavum thickening. Mild to moderate spinal stenosis. Moderate bilateral C7 foraminal narrowing. C7-T1:  Facet hypertrophy.  No significant stenosis. Visualized upper thoracic spine within normal limits. MRI LUMBAR SPINE  FINDINGS Segmentation: Normal segmentation. Lowest well-formed disc labeled the L5-S1 level. Alignment: Extensive thoracolumbar dextroscoliosis. Trace retrolisthesis o T12 on L1 and L1 on L2. Vertebrae: Vertebral body heights maintained without evidence for acute or chronic fracture. Bone marrow signal intensity within normal limits. No discrete or worrisome osseous lesions. Reactive endplate changes present about the L1-2 and L2-3 interspaces related to scoliotic curvature. Conus medullaris and cauda equina: Conus extends to the L1 level. Conus and cauda equina appear normal. Paraspinal and other soft tissues: Mild edema within the lower left paraspinous soft tissues (series 17, image 18), of uncertain significance, but could reflect muscular strain/injury. Possible myositis could also be considered. Paraspinous soft tissues demonstrate no other acute abnormality. Visualized visceral structures unremarkable. Disc levels: L1-2: Mild diffuse disc bulge. Left far lateral reactive endplate changes with endplate osteophytic spurring. Mild facet hypertrophy. No significant stenosis. L2-3: Mild diffuse disc bulge. Left far lateral reactive endplate changes with endplate osteophytic spurring. Mild to moderate facet hypertrophy. Borderline mild left lateral recess narrowing without significant spinal stenosis. Mild left L2 foraminal  narrowing. L3-4: Mild diffuse disc bulge with disc desiccation. Disc bulging slightly asymmetric to the right. Moderate facet and ligament flavum hypertrophy. Resultant mild right lateral recess narrowing without significant spinal stenosis. Foramina are patent. L4-5: Minimal disc bulge. Right far lateral reactive endplate changes. Moderate facet hypertrophy. Resultant mild to moderate right lateral recess narrowing without spinal stenosis. Mild right L4 foraminal narrowing. L5-S1: Diffuse disc bulge with intervertebral disc space narrowing. Chronic reactive endplate changes on the right.  Bilateral facet hypertrophy. No significant stenosis or impingement. IMPRESSION: MRI HEAD IMPRESSION: Normal brain MRI for age. No acute intracranial abnormality identified. MRI CERVICAL SPINE IMPRESSION: 1. No acute abnormality within the cervical spine. 2. Moderate multilevel cervical spondylolysis with resultant mild to moderate diffuse spinal stenosis at C3-4 through C6-7, most pronounced at C5-6. 3. Multifactorial degenerative changes with resultant multilevel foraminal narrowing as above. Notable findings include moderate bilateral C4, C5, and C7 foraminal narrowing, with moderate to severe bilateral C6 foraminal stenosis. MRI LUMBAR SPINE IMPRESSION: 1. No acute abnormality within the lumbar spine. 2. Mild multilevel degenerative disc disease and facet hypertrophy for age without significant spinal stenosis. Mild lateral recess narrowing on the left at L2-3 and on the right at L3-4 and L4-5. 3. Pronounced thoracolumbar dextroscoliosis. Electronically Signed   By: Jeannine Boga M.D.   On: 03/09/2018 20:39   US Abdomen Complete  Result Date: 03/09/2018 CLINICAL DATA:  Abdominal pain for the past 2 days. History of colonic malignancy with resection. EXAM: ABDOMEN ULTRASOUND COMPLETE COMPARISON:  No recent studies in PACs FINDINGS: Gallbladder: No gallstones or wall thickening visualized. No sonographic Murphy sign noted by sonographer. Common bile duct: Diameter: 3.5 mm Liver: No focal lesion identified. Within normal limits in parenchymal echogenicity. Portal vein is patent on color Doppler imaging with normal direction of blood flow towards the liver. IVC: No abnormality visualized. Pancreas: Visualized portion unremarkable. Spleen: Size and appearance within normal limits. Right Kidney: Length: 10.3 cm. Echogenicity within normal limits. No mass or hydronephrosis visualized. Left Kidney: Length: 10.0 cm. Echogenicity within normal limits. No mass or hydronephrosis visualized. Abdominal aorta: No  aneurysm visualized. Other findings: There is no ascites. IMPRESSION: No gallstones or sonographic evidence of acute cholecystitis. No acute intra-abdominal abnormality is observed. Electronically Signed   By: David  Martinique M.D.   On: 03/09/2018 10:55       Phillips Climes M.D on 03/11/2018 at 2:38 PM  Between 7am to 7pm - Pager - [phone]  After 7pm go to [email or web address] - password [facility]  [facility] -  Office  [phone]

## 2018-03-11 NOTE — Discharge Instructions (Signed)

## 2018-03-11 NOTE — Progress Notes (Signed)
Nutrition Follow-up  DOCUMENTATION CODES:   Non-severe (moderate) malnutrition in context of chronic illness  INTERVENTION:   -Continue Ensure Enlive po BID, each supplement provides 350 kcal and 20 grams of protein -MVI with minerals daily  NUTRITION DIAGNOSIS:   Moderate Malnutrition related to chronic illness(CHF) as evidenced by mild fat depletion, moderate fat depletion, mild muscle depletion, moderate muscle depletion.  Ongoing  GOAL:   Patient will meet greater than or equal to 90% of their needs  Progressing  MONITOR:   PO intake, Weight trends, I & O's, Skin, Labs, Supplement acceptance  REASON FOR ASSESSMENT:   Malnutrition Screening Tool    ASSESSMENT:   84 year old female who presented to the ED with AMS. PMH significant for CHF, hypertension, hypothyroidism, colon cancer s/p resection in remission.  Pt sitting up in recliner chair at time of visit. Pt in good spirits this morning and daughter is visiting. Both confirm that appetite and intake has improved since hospitalization. Pt shares that she consumed "most" of her breakfast this morning, which consisted of grits, eggs, and toast. Daughter at bedside reports that pt has been consuming a lot more fluids lately and has been consuming Ensure supplements. Pt enjoys these, but only the chocolate flavor ("if it ain't chocolate, it ain't worth it").   Discussed with pt importance of good meal and supplement intake to promote healing. Pt and daughter have no further questions at this time, but expressed appreciation for visit.   Labs reviewed: CBGS: 106, Na: 132.   Diet Order:   Diet Order           Diet Heart Room service appropriate? Yes; Fluid consistency: Thin; Fluid restriction: 1200 mL Fluid  Diet effective now          EDUCATION NEEDS:   No education needs have been identified at this time  Skin:  Skin Assessment: Skin Integrity Issues: Skin Integrity Issues:: Other (Comment) Other: cellulitis  to BLE  Last BM:  03/08/18  Height:   Ht Readings from Last 1 Encounters:  03/09/18 5\' 6"  (1.676 m)    Weight:   Wt Readings from Last 1 Encounters:  03/11/18 126 lb 1.6 oz (57.2 kg)    Ideal Body Weight:  59.09 kg  BMI:  Body mass index is 20.35 kg/m.  Estimated Nutritional Needs:   Kcal:  1550-1750 kcal/day  Protein:  75-90 grams/day  Fluid:  per MD    Onyekachi Gathright A. Jimmye Norman, RD, LDN, CDE Pager: [phone] After hours Pager: [phone]

## 2018-03-11 NOTE — Progress Notes (Signed)
Physical Therapy Treatment Patient Details Name: Regina [name] MRN: [id number] DOB: 08-Apr-1933 Today's Date: 03/11/2018    History of Present Illness 84 yo admitted with generalized weakness and UTI. PMhx: HTN, CHF, colon CA, hypothyroidism, LBP    PT Comments    Patient with regression since previous physical therapy. Currently requiring heavy moderate assistance for transferring out of bed to walker secondary to significant posterior lean. Unable to ambulate due to patient fear of falling and poor balance. Session focused on therapeutic exercises at edge of bed to promote sitting balance and lower extremity strengthening. Patient continues to display confusion and requires multimodal cues for tasks and frequent redirection. SNF remains appropriate.    Follow Up Recommendations  SNF;Supervision/Assistance - 24 hour     Equipment Recommendations  3in1 (PT)    Recommendations for Other Services       Precautions / Restrictions Precautions Precautions: Fall Restrictions Weight Bearing Restrictions: No    Mobility  Bed Mobility Overal bed mobility: Needs Assistance Bed Mobility: Rolling;Sidelying to Sit;Sit to Sidelying Rolling: Min assist Sidelying to sit: Mod assist     Sit to sidelying: Mod assist General bed mobility comments: moderate assistance for elevating trunk out of bed and for assistance of BLE's back into bed. cues for use of bed rails for trunk initiation.  Transfers Overall transfer level: Needs assistance Equipment used: Rolling walker (2 wheeled) Transfers: Sit to/from [facility] Sit to Stand: Mod assist Stand pivot transfers: +2 safety/equipment;Min assist       General transfer comment: Heavy moderate assistance from elevated bed to walker with significant posterior lean. Max verbal cues and hand over hand guidance for proper hand placement. Directional cueing provided for stand pivot from bed to and from [facility]; pt husband assisted with  stabilizing walker.   Ambulation/Gait                 Stairs             Wheelchair Mobility    Modified Rankin (Stroke Patients Only)       Balance Overall balance assessment: Needs assistance Sitting-balance support: Bilateral upper extremity supported;Feet supported Sitting balance-Leahy Scale: Fair Sitting balance - Comments: began to experience posterior lean with feet unsupported and required cueing to resume upright posture     Standing balance-Leahy Scale: Poor                              Cognition Arousal/Alertness: Awake/alert Behavior During Therapy: Flat affect Overall Cognitive Status: Impaired/Different from baseline Area of Impairment: Safety/judgement;Following commands;Problem solving                       Following Commands: Follows one step commands consistently;Follows one step commands with increased time Safety/Judgement: Decreased awareness of safety;Decreased awareness of deficits   Problem Solving: Decreased initiation;Slow processing;Difficulty sequencing;Requires verbal cues;Requires tactile cues General Comments: Easily distracted and needs redirection to the task.       Exercises General Exercises - Lower Extremity Long Arc Quad: 10 reps;Both;Seated Hip Flexion/Marching: 20 reps;Both;Seated Heel Raises: 20 reps;Both;Seated    General Comments        Pertinent Vitals/Pain Pain Assessment: Faces Faces Pain Scale: Hurts little more Pain Location: right shoulder, bilateral hips, back Pain Descriptors / Indicators: Discomfort Pain Intervention(s): Monitored during session    Home Living  Prior Function            PT Goals (current goals can now be found in the care plan section) Acute Rehab PT Goals Patient Stated Goal: return home Potential to Achieve Goals: Fair    Frequency    Min 2X/week      PT Plan Frequency needs to be updated    Co-evaluation               AM-PAC PT "6 Clicks" Daily Activity  Outcome Measure  Difficulty turning over in bed (including adjusting bedclothes, sheets and blankets)?: Unable Difficulty moving from lying on back to sitting on the side of the bed? : Unable Difficulty sitting down on and standing up from a chair with arms (e.g., wheelchair, bedside commode, etc,.)?: Unable Help needed moving to and from a bed to chair (including a wheelchair)?: A Lot Help needed walking in hospital room?: A Lot Help needed climbing 3-5 steps with a railing? : A Lot 6 Click Score: 9    End of Session Equipment Utilized During Treatment: Gait belt Activity Tolerance: Patient limited by pain;Patient limited by fatigue Patient left: in bed;with call bell/phone within reach;with family/visitor present   PT Visit Diagnosis: Other abnormalities of gait and mobility (R26.89);Muscle weakness (generalized) (M62.81);Difficulty in walking, not elsewhere classified (R26.2)     Time: 8182-9937 PT Time Calculation (min) (ACUTE ONLY): 27 min  Charges:  $Therapeutic Exercise: 8-22 mins $Therapeutic Activity: 8-22 mins                    Ellamae Sia, PT, DPT Acute Rehabilitation Services  Pager: [place] 03/11/2018, 5:25 PM

## 2018-03-12 ENCOUNTER — Inpatient Hospital Stay (HOSPITAL_COMMUNITY): Payer: Medicare Other

## 2018-03-12 DIAGNOSIS — R5383 Other fatigue: Secondary | ICD-10-CM

## 2018-03-12 DIAGNOSIS — I959 Hypotension, unspecified: Secondary | ICD-10-CM

## 2018-03-12 LAB — CBC
HCT: 28.1 % — ABNORMAL LOW (ref 36.0–46.0)
Hemoglobin: 9.5 g/dL — ABNORMAL LOW (ref 12.0–15.0)
MCH: 32.4 pg (ref 26.0–34.0)
MCHC: 33.8 g/dL (ref 30.0–36.0)
MCV: 95.9 fL (ref 78.0–100.0)
Platelets: 229 10*3/uL (ref 150–400)
RBC: 2.93 MIL/uL — ABNORMAL LOW (ref 3.87–5.11)
RDW: 12.4 % (ref 11.5–15.5)
WBC: 8.5 10*3/uL (ref 4.0–10.5)

## 2018-03-12 LAB — BASIC METABOLIC PANEL
ANION GAP: 11 (ref 5–15)
BUN: 25 mg/dL — ABNORMAL HIGH (ref 8–23)
CALCIUM: 8.3 mg/dL — AB (ref 8.9–10.3)
CO2: 28 mmol/L (ref 22–32)
Chloride: 92 mmol/L — ABNORMAL LOW (ref 98–111)
Creatinine, Ser: 1.39 mg/dL — ABNORMAL HIGH (ref 0.44–1.00)
GFR, EST AFRICAN AMERICAN: 40 mL/min — AB (ref >60)
GFR, EST NON AFRICAN AMERICAN: 34 mL/min — AB (ref >60)
Glucose, Bld: 188 mg/dL — ABNORMAL HIGH (ref 70–99)
Potassium: 3.8 mmol/L (ref 3.5–5.1)
Sodium: 131 mmol/L — ABNORMAL LOW (ref 135–145)

## 2018-03-12 LAB — BLOOD GAS, ARTERIAL
ACID-BASE EXCESS: 5.7 mmol/L — AB (ref 0.0–2.0)
BICARBONATE: 29.6 mmol/L — AB (ref 20.0–28.0)
Drawn by: 274071
FIO2: 21
O2 Saturation: 95.7 %
PATIENT TEMPERATURE: 98.6
PCO2 ART: 42.4 mmHg (ref 32.0–48.0)
pH, Arterial: 7.458 — ABNORMAL HIGH (ref 7.350–7.450)
pO2, Arterial: 77.3 mmHg — ABNORMAL LOW (ref 83.0–108.0)

## 2018-03-12 LAB — TROPONIN I: Troponin I: <0.03 ng/mL (ref <0.03)

## 2018-03-12 LAB — PROCALCITONIN

## 2018-03-12 MED ORDER — CARVEDILOL 6.25 MG PO TABS
6.2500 mg | ORAL_TABLET | Freq: Two times a day (BID) | ORAL | Status: DC
  Administered 2018-03-13: 6.25 mg via ORAL
  Filled 2018-03-12: qty 1

## 2018-03-12 MED ORDER — LEVOFLOXACIN 500 MG PO TABS
250.0000 mg | ORAL_TABLET | Freq: Every day | ORAL | Status: DC
  Filled 2018-03-12: qty 1

## 2018-03-12 MED ORDER — SODIUM CHLORIDE 0.9 % IV SOLN
INTRAVENOUS | Status: AC
  Administered 2018-03-12 (×2): via INTRAVENOUS

## 2018-03-12 MED ORDER — SODIUM CHLORIDE 0.9 % IV BOLUS: 500.0000 mL | Freq: Once | INTRAVENOUS | Status: AC

## 2018-03-12 MED ORDER — SODIUM CHLORIDE 0.9 % IV BOLUS
500.0000 mL | Freq: Once | INTRAVENOUS | Status: AC
  Administered 2018-03-12: 500 mL via INTRAVENOUS

## 2018-03-12 NOTE — Progress Notes (Signed)
Pharmacy Antibiotic Note  Regina [name] is a 84 y.o. female admitted on 03/08/2018 with UTI.  Pharmacy has been consulted for Levofloxacin dosing.  Plan: Levofloxacin 250 mg po qday x 4 more days to complete a 7 day course   Height: 5\' 6"  (167.6 cm) Weight: 129 lb 3 oz (58.6 kg) IBW/kg (Calculated) : 59.3  Temp (24hrs), Avg:97.9 F (36.6 C), Min:97.5 F (36.4 C), Max:98.5 F (36.9 C)  Recent Labs  Lab 03/08/18 2331 03/08/18 2342 03/09/18 0250 03/09/18 0745 03/10/18 0531 03/11/18 0610  WBC 7.6  --   --  6.5 6.8  --   CREATININE 1.38*  --   --  1.15* 1.05* 1.08*  LATICACIDVEN  --  0.91 0.56  --   --   --     Estimated Creatinine Clearance: 37.2 mL/min (A) (by C-G formula based on SCr of 1.08 mg/dL (H)).    Allergies  Allergen Reactions  . Penicillins Hives and Itching    Antimicrobials this admission: Cipro 7/29 >> 8/1 Levoflox 8/1 >> (8/4)  Microbiology results:  7/29 UCx: Enterococcus faecalis   Thank you for allowing pharmacy to be a part of this patient's care.  Corinda Gubler, PharmD, [facility] 03/12/2018 12:11 PM

## 2018-03-12 NOTE — Progress Notes (Signed)
PROGRESS NOTE                                                                                                                                                                                                             Patient Demographics:    Regina [name], is a 84 y.o. female, DOB - [birth year], BSJ:[id number]  Admit date - 03/08/2018   Admitting Physician Rise Patience, MD  Outpatient Primary MD for the patient is [name], MD  LOS - 2   Chief Complaint  Patient presents with  . Altered Mental Status       Brief Narrative   Regina [name] a 84 y.o.femalewithhistory of diastolic CHF, hypertension, hypothyroidism, colon cancer status post resection in remission was brought to the ER after patient was found to have increasing difficulty ambulating with imbalance.   As well she did have confusion, and significant delirium during hospital stay, her workup was significant for acute on chronic CHF, and UTI, mentation significantly improved with UTI treatment.   Subjective:    [name] today has, No headache, No chest pain, No abdominal pain - No Nausea,  No Cough - SOB.   Addendum: Rapid response was called this afternoon, please see discussion below   Assessment  & Plan :    Principal Problem:   Delirium Active Problems:   Chronic diastolic heart failure (HCC)   Essential hypertension   Hypothyroidism   Ataxia   Weakness generalized   Acute renal failure superimposed on chronic kidney disease (HCC)   Weakness   Malnutrition of moderate degree   Acute on chronic diastolic CHF (congestive heart failure) (HCC)   Hip pain   Acute cystitis without hematuria   Memory loss   Generalized weakness/ difficulty ambulating:  - suspect multifactorial, UTI and acute CHF w/ lower ext edema. Not much stronger yet. PT eval - MRI C-spine and L-spine show some disease but no severe spinal stenosis   Altered mental status:  - consistent  w/ acute delirium, prob due to either UTI, acute CHF, dislocation (recent beach trip x 1 wk) and/ or underlying dementia -  MRI brain with no acute findings, mentation significantly improved today with treatment of her UTI. - treating UTI w cipro - treating acute CHF w/ IV lasix, improving on exam - MRI brain is negative - TSH  and B12 are within normal limits  Acute renal failure:  - creat up 1.38 admission, improved with IV diuresis, is 1.08  Bilateral hip pain:  - most likely due to arthritis,plain film pelvis xray w/o fracture   Hypothyroidism  -on Synthroid.  Chronic diastolic CHF: -  last EF measured 2015 was 55- 60% w G1DD -Hold diuresis given low blood pressure  Hypertension: - cont on Coreg, cont holding ARB due to increased creatinine  Chronic anemia-we will check anemia panel. Follow CBC.  UTI -  Urine culture growing Enterococcus, she has allergy to penicillin, switched Cipro to levofloxacin as discussed with ID Dr. Drucilla Schmidt  Hypotension/decreased responsiveness -Rapid response was called this afternoon, patient was lethargic in recliner, hypotensive 74/50, no focal deficits noted on physical exam, did respond to fluid bolus, most recent blood pressure 102/60, kidney with IV fluids, she denies any chest pain or shortness of breath, I will obtain CBC, BMP, EKG, troponins and procalcitonin.     Code Status :  Full code  Family Communication  :  Daughter at bedside  Disposition Plan  :  SNF placement per PT  Consults  :  none  Procedures  : none  DVT Prophylaxis  :    Lab Results  Component Value Date   PLT 229 03/12/2018    Antibiotics  :    Anti-infectives (From admission, onward)   Start     Dose/Rate Route Frequency Ordered Stop   03/12/18 2200  levofloxacin (LEVAQUIN) tablet 250 mg     250 mg Oral Daily 03/12/18 1210 03/16/18 2159   03/11/18 2000  ciprofloxacin (CIPRO) tablet 250 mg  Status:  Discontinued     250 mg Oral 2 times daily  03/11/18 1440 03/12/18 1136   03/10/18 2000  ciprofloxacin (CIPRO) tablet 250 mg  Status:  Discontinued     250 mg Oral 2 times daily 03/10/18 0916 03/11/18 1424   03/10/18 0600  ciprofloxacin (CIPRO) IVPB 200 mg  Status:  Discontinued     200 mg 100 mL/hr over 60 Minutes Intravenous Every 24 hours 03/09/18 0626 03/10/18 0916   03/09/18 0630  ciprofloxacin (CIPRO) IVPB 200 mg     200 mg 100 mL/hr over 60 Minutes Intravenous  Once 03/09/18 0626 03/09/18 0928        Objective:   Vitals:   03/12/18 1400 03/12/18 1403 03/12/18 1409 03/12/18 1416  BP:  (!) 102/40 (!) 114/49 (!) 112/37  Pulse: 69 67 72 68  Resp:  20    Temp:      TempSrc:      SpO2: 99% 99% 100% 99%  Weight:      Height:        Wt Readings from Last 3 Encounters:  03/12/18 58.6 kg (129 lb 3 oz)  09/06/14 65.5 kg (144 lb 6.4 oz)  08/03/14 65.3 kg (144 lb)     Intake/Output Summary (Last 24 hours) at 03/12/2018 1455 Last data filed at 03/12/2018 0916 Gross per 24 hour  Intake 360 ml  Output 600 ml  Net -240 ml     Physical Exam  On the morning exam awake Alert, Oriented X 3, appears to be appropriate this morning  symmetrical Chest wall movement, Good air movement bilaterally, CTAB RRR,No Gallops,Rubs or new Murmurs, No Parasternal Heave +ve B.Sounds, Abd Soft, No tenderness,  No rebound - guarding or rigidity. No Cyanosis, Clubbing or edema, No new Rash or bruise    This afternoon during rapid response physical exam patient  is more lethargic, confused, but no focal deficits    Data Review:    CBC Recent Labs  Lab 03/08/18 2331 03/09/18 0745 03/10/18 0531 03/12/18 1426  WBC 7.6 6.5 6.8 8.5  HGB 10.7* 10.1* 9.6* 9.5*  HCT 31.1* 29.4* 28.4* 28.1*  PLT 225 229 213 229  MCV 92.6 93.6 94.4 95.9  MCH 31.8 32.2 31.9 32.4  MCHC 34.4 34.4 33.8 33.8  RDW 11.8 12.1 12.2 12.4  LYMPHSABS  --  1.4  --   --   MONOABS  --  0.7  --   --   EOSABS  --  0.1  --   --   BASOSABS  --  0.0  --   --      Chemistries  Recent Labs  Lab 03/08/18 2331 03/09/18 0745 03/10/18 0531 03/11/18 0610  NA 129* 134* 133* 132*  K 3.8 3.5 3.3* 3.9  CL 92* 95* 96* 92*  CO2 26 27 29 29   GLUCOSE 112* 85 83 129*  BUN 25* 18 15 17   CREATININE 1.38* 1.15* 1.05* 1.08*  CALCIUM 9.4 9.2 8.9 9.6  AST 31 29  --   --   ALT 21 19  --   --   ALKPHOS 63 54  --   --   BILITOT 1.0 1.2  --   --    ------------------------------------------------------------------------------------------------------------------ No results for input(s): CHOL, HDL, LDLCALC, TRIG, CHOLHDL, LDLDIRECT in the last 72 hours.  No results found for: HGBA1C ------------------------------------------------------------------------------------------------------------------ No results for input(s): TSH, T4TOTAL, T3FREE, THYROIDAB in the last 72 hours.  Invalid input(s): FREET3 ------------------------------------------------------------------------------------------------------------------ No results for input(s): VITAMINB12, FOLATE, FERRITIN, TIBC, IRON, RETICCTPCT in the last 72 hours.  Coagulation profile No results for input(s): INR, PROTIME in the last 168 hours.  No results for input(s): DDIMER in the last 72 hours.  Cardiac Enzymes Recent Labs  Lab 03/09/18 0245 03/09/18 0745  TROPONINI <0.03 <0.03   ------------------------------------------------------------------------------------------------------------------ No results found for: BNP  Inpatient Medications  Scheduled Meds: . calcitRIOL  0.25 mcg Oral Daily  . carvedilol  6.25 mg Oral BID WC  . enoxaparin (LOVENOX) injection  30 mg Subcutaneous Q24H  . feeding supplement (ENSURE ENLIVE)  237 mL Oral BID BM  . levofloxacin  250 mg Oral Daily  . levothyroxine  88 mcg Oral QAC breakfast  . multivitamin with minerals  1 tablet Oral Daily  . pantoprazole  40 mg Oral Daily   Continuous Infusions: . sodium chloride 250 mL (03/10/18 0619)  . sodium chloride    .  sodium chloride    . sodium chloride     PRN Meds:.sodium chloride, acetaminophen **OR** acetaminophen, hydrALAZINE, ondansetron **OR** ondansetron (ZOFRAN) IV, polyvinyl alcohol  Micro Results Recent Results (from the past 240 hour(s))  Culture, Urine     Status: Abnormal   Collection Time: 03/09/18  4:40 AM  Result Value Ref Range Status   Specimen Description URINE, RANDOM  Final   Special Requests   Final    NONE Performed at [facility], [address] [place], [place] [address]    Culture 60,000 COLONIES/mL ENTEROCOCCUS FAECALIS (A)  Final   Report Status 03/11/2018 FINAL  Final   Organism ID, Bacteria ENTEROCOCCUS FAECALIS (A)  Final      Susceptibility   Enterococcus faecalis - MIC*    AMPICILLIN <=2 SENSITIVE Sensitive     LEVOFLOXACIN 1 SENSITIVE Sensitive     NITROFURANTOIN <=16 SENSITIVE Sensitive     VANCOMYCIN 1 SENSITIVE Sensitive     *  60,000 COLONIES/mL ENTEROCOCCUS FAECALIS    Radiology Reports Dg Pelvis 1-2 Views  Result Date: 03/09/2018 CLINICAL DATA:  Bilateral hip pain. EXAM: PELVIS - 1-2 VIEW COMPARISON:  None. FINDINGS: The cortical margins of the bony pelvis are intact. No fracture. Pubic symphysis and sacroiliac joints are congruent. Both femoral heads are well-seated in the respective acetabula. The joint spaces are preserved. Degenerative disc disease at L4-L5, partially included. IMPRESSION: 1. No acute abnormality. 2. L4-L5 degenerative change, partially included. Electronically Signed   By: Jeb Levering M.D.   On: 03/09/2018 05:47   Mr Brain Wo Contrast  Result Date: 03/09/2018 CLINICAL DATA:  Initial evaluation for abnormal gait, neck pain, generalized weakness. EXAM: MRI HEAD WITHOUT CONTRAST MRI CERVICAL AND LUMBAR SPINE WITHOUT CONTRAST TECHNIQUE: Multiplanar and multiecho pulse sequences of the cervical spine, to include the craniocervical junction and cervicothoracic junction, and lumbar spine, were obtained without intravenous  contrast. COMPARISON:  None available. FINDINGS: MRI HEAD FINDINGS Brain: Cerebral volume within normal limits for patient age. Minimal T2/FLAIR hyperintensity within the periventricular white matter, nonspecific, but felt to be within normal limits for age. No abnormal foci of restricted diffusion to suggest acute or subacute ischemia. Gray-white matter differentiation well maintained. No encephalomalacia to suggest chronic infarction. No foci of susceptibility artifact to suggest acute or chronic intracranial hemorrhage. Mass lesion, midline shift or mass effect. No hydrocephalus. No extra-axial fluid collection. Major dural sinuses are grossly patent. Pituitary gland and suprasellar region are normal. Midline structures intact and normal. Vascular: Major intracranial vascular flow voids well maintained and normal in appearance. Skull and upper cervical spine: Craniocervical junction normal. Visualized upper cervical spine within normal limits. Bone marrow signal intensity normal. No scalp soft tissue abnormality. Sinuses/Orbits: Globes and orbital soft tissues demonstrate no acute finding. Patient status post ocular lens replacement bilaterally. Mucous retention cyst noted within the right maxillary sinus. Paranasal sinuses are otherwise largely clear. No significant mastoid effusion. Inner ear structures grossly normal. Other: None. MRI CERVICAL SPINE FINDINGS Alignment: Exaggeration of the normal cervical lordosis. No listhesis. Vertebrae: Vertebral body heights maintained bone marrow signal intensity within normal limits. No discrete or worrisome osseous lesions. Mild reactive endplate changes present about the C4-5 through C6-7 interspaces. No other abnormal marrow edema. Cord: Signal intensity within the cervical spinal cord within normal limits. Posterior Fossa, vertebral arteries, paraspinal tissues: Craniocervical junction within normal limits. Paraspinous and prevertebral soft tissues normal. Normal  intravascular flow voids present within the vertebral arteries bilaterally. Disc levels: C2-C3: Unremarkable. C3-C4: Shallow central disc protrusion indents the ventral thecal sac, contacting the ventral cord without cord deformity. Mild spinal stenosis. Bilateral uncovertebral hypertrophy with resultant mild to moderate right greater than left neural foraminal narrowing. C4-C5: Chronic intervertebral disc space narrowing with diffuse degenerative disc osteophyte. Broad posterior component flattens and effaces the ventral thecal sac. Facet and ligament flavum hypertrophy. Resultant moderate spinal stenosis without significant cord deformity. Moderate bilateral C5 foraminal narrowing. C5-C6: Diffuse degenerative disc osteophyte with intervertebral disc space narrowing. Ligamentum flavum hypertrophy. Moderate spinal stenosis without cord deformity. Moderate to severe bilateral C6 foraminal narrowing. C6-C7: Diffuse degenerative disc osteophyte. Flattening and effacement of the ventral thecal sac. Ligamentum flavum thickening. Mild to moderate spinal stenosis. Moderate bilateral C7 foraminal narrowing. C7-T1:  Facet hypertrophy.  No significant stenosis. Visualized upper thoracic spine within normal limits. MRI LUMBAR SPINE FINDINGS Segmentation: Normal segmentation. Lowest well-formed disc labeled the L5-S1 level. Alignment: Extensive thoracolumbar dextroscoliosis. Trace retrolisthesis o T12 on L1 and L1 on L2. Vertebrae: Vertebral body  heights maintained without evidence for acute or chronic fracture. Bone marrow signal intensity within normal limits. No discrete or worrisome osseous lesions. Reactive endplate changes present about the L1-2 and L2-3 interspaces related to scoliotic curvature. Conus medullaris and cauda equina: Conus extends to the L1 level. Conus and cauda equina appear normal. Paraspinal and other soft tissues: Mild edema within the lower left paraspinous soft tissues (series 17, image 18), of  uncertain significance, but could reflect muscular strain/injury. Possible myositis could also be considered. Paraspinous soft tissues demonstrate no other acute abnormality. Visualized visceral structures unremarkable. Disc levels: L1-2: Mild diffuse disc bulge. Left far lateral reactive endplate changes with endplate osteophytic spurring. Mild facet hypertrophy. No significant stenosis. L2-3: Mild diffuse disc bulge. Left far lateral reactive endplate changes with endplate osteophytic spurring. Mild to moderate facet hypertrophy. Borderline mild left lateral recess narrowing without significant spinal stenosis. Mild left L2 foraminal narrowing. L3-4: Mild diffuse disc bulge with disc desiccation. Disc bulging slightly asymmetric to the right. Moderate facet and ligament flavum hypertrophy. Resultant mild right lateral recess narrowing without significant spinal stenosis. Foramina are patent. L4-5: Minimal disc bulge. Right far lateral reactive endplate changes. Moderate facet hypertrophy. Resultant mild to moderate right lateral recess narrowing without spinal stenosis. Mild right L4 foraminal narrowing. L5-S1: Diffuse disc bulge with intervertebral disc space narrowing. Chronic reactive endplate changes on the right. Bilateral facet hypertrophy. No significant stenosis or impingement. IMPRESSION: MRI HEAD IMPRESSION: Normal brain MRI for age. No acute intracranial abnormality identified. MRI CERVICAL SPINE IMPRESSION: 1. No acute abnormality within the cervical spine. 2. Moderate multilevel cervical spondylolysis with resultant mild to moderate diffuse spinal stenosis at C3-4 through C6-7, most pronounced at C5-6. 3. Multifactorial degenerative changes with resultant multilevel foraminal narrowing as above. Notable findings include moderate bilateral C4, C5, and C7 foraminal narrowing, with moderate to severe bilateral C6 foraminal stenosis. MRI LUMBAR SPINE IMPRESSION: 1. No acute abnormality within the lumbar  spine. 2. Mild multilevel degenerative disc disease and facet hypertrophy for age without significant spinal stenosis. Mild lateral recess narrowing on the left at L2-3 and on the right at L3-4 and L4-5. 3. Pronounced thoracolumbar dextroscoliosis. Electronically Signed   By: Jeannine Boga M.D.   On: 03/09/2018 20:39   Mr Cervical Spine Wo Contrast  Result Date: 03/09/2018 CLINICAL DATA:  Initial evaluation for abnormal gait, neck pain, generalized weakness. EXAM: MRI HEAD WITHOUT CONTRAST MRI CERVICAL AND LUMBAR SPINE WITHOUT CONTRAST TECHNIQUE: Multiplanar and multiecho pulse sequences of the cervical spine, to include the craniocervical junction and cervicothoracic junction, and lumbar spine, were obtained without intravenous contrast. COMPARISON:  None available. FINDINGS: MRI HEAD FINDINGS Brain: Cerebral volume within normal limits for patient age. Minimal T2/FLAIR hyperintensity within the periventricular white matter, nonspecific, but felt to be within normal limits for age. No abnormal foci of restricted diffusion to suggest acute or subacute ischemia. Gray-white matter differentiation well maintained. No encephalomalacia to suggest chronic infarction. No foci of susceptibility artifact to suggest acute or chronic intracranial hemorrhage. Mass lesion, midline shift or mass effect. No hydrocephalus. No extra-axial fluid collection. Major dural sinuses are grossly patent. Pituitary gland and suprasellar region are normal. Midline structures intact and normal. Vascular: Major intracranial vascular flow voids well maintained and normal in appearance. Skull and upper cervical spine: Craniocervical junction normal. Visualized upper cervical spine within normal limits. Bone marrow signal intensity normal. No scalp soft tissue abnormality. Sinuses/Orbits: Globes and orbital soft tissues demonstrate no acute finding. Patient status post ocular lens replacement bilaterally. Mucous  retention cyst noted  within the right maxillary sinus. Paranasal sinuses are otherwise largely clear. No significant mastoid effusion. Inner ear structures grossly normal. Other: None. MRI CERVICAL SPINE FINDINGS Alignment: Exaggeration of the normal cervical lordosis. No listhesis. Vertebrae: Vertebral body heights maintained bone marrow signal intensity within normal limits. No discrete or worrisome osseous lesions. Mild reactive endplate changes present about the C4-5 through C6-7 interspaces. No other abnormal marrow edema. Cord: Signal intensity within the cervical spinal cord within normal limits. Posterior Fossa, vertebral arteries, paraspinal tissues: Craniocervical junction within normal limits. Paraspinous and prevertebral soft tissues normal. Normal intravascular flow voids present within the vertebral arteries bilaterally. Disc levels: C2-C3: Unremarkable. C3-C4: Shallow central disc protrusion indents the ventral thecal sac, contacting the ventral cord without cord deformity. Mild spinal stenosis. Bilateral uncovertebral hypertrophy with resultant mild to moderate right greater than left neural foraminal narrowing. C4-C5: Chronic intervertebral disc space narrowing with diffuse degenerative disc osteophyte. Broad posterior component flattens and effaces the ventral thecal sac. Facet and ligament flavum hypertrophy. Resultant moderate spinal stenosis without significant cord deformity. Moderate bilateral C5 foraminal narrowing. C5-C6: Diffuse degenerative disc osteophyte with intervertebral disc space narrowing. Ligamentum flavum hypertrophy. Moderate spinal stenosis without cord deformity. Moderate to severe bilateral C6 foraminal narrowing. C6-C7: Diffuse degenerative disc osteophyte. Flattening and effacement of the ventral thecal sac. Ligamentum flavum thickening. Mild to moderate spinal stenosis. Moderate bilateral C7 foraminal narrowing. C7-T1:  Facet hypertrophy.  No significant stenosis. Visualized upper thoracic  spine within normal limits. MRI LUMBAR SPINE FINDINGS Segmentation: Normal segmentation. Lowest well-formed disc labeled the L5-S1 level. Alignment: Extensive thoracolumbar dextroscoliosis. Trace retrolisthesis o T12 on L1 and L1 on L2. Vertebrae: Vertebral body heights maintained without evidence for acute or chronic fracture. Bone marrow signal intensity within normal limits. No discrete or worrisome osseous lesions. Reactive endplate changes present about the L1-2 and L2-3 interspaces related to scoliotic curvature. Conus medullaris and cauda equina: Conus extends to the L1 level. Conus and cauda equina appear normal. Paraspinal and other soft tissues: Mild edema within the lower left paraspinous soft tissues (series 17, image 18), of uncertain significance, but could reflect muscular strain/injury. Possible myositis could also be considered. Paraspinous soft tissues demonstrate no other acute abnormality. Visualized visceral structures unremarkable. Disc levels: L1-2: Mild diffuse disc bulge. Left far lateral reactive endplate changes with endplate osteophytic spurring. Mild facet hypertrophy. No significant stenosis. L2-3: Mild diffuse disc bulge. Left far lateral reactive endplate changes with endplate osteophytic spurring. Mild to moderate facet hypertrophy. Borderline mild left lateral recess narrowing without significant spinal stenosis. Mild left L2 foraminal narrowing. L3-4: Mild diffuse disc bulge with disc desiccation. Disc bulging slightly asymmetric to the right. Moderate facet and ligament flavum hypertrophy. Resultant mild right lateral recess narrowing without significant spinal stenosis. Foramina are patent. L4-5: Minimal disc bulge. Right far lateral reactive endplate changes. Moderate facet hypertrophy. Resultant mild to moderate right lateral recess narrowing without spinal stenosis. Mild right L4 foraminal narrowing. L5-S1: Diffuse disc bulge with intervertebral disc space narrowing. Chronic  reactive endplate changes on the right. Bilateral facet hypertrophy. No significant stenosis or impingement. IMPRESSION: MRI HEAD IMPRESSION: Normal brain MRI for age. No acute intracranial abnormality identified. MRI CERVICAL SPINE IMPRESSION: 1. No acute abnormality within the cervical spine. 2. Moderate multilevel cervical spondylolysis with resultant mild to moderate diffuse spinal stenosis at C3-4 through C6-7, most pronounced at C5-6. 3. Multifactorial degenerative changes with resultant multilevel foraminal narrowing as above. Notable findings include moderate bilateral C4, C5, and C7 foraminal narrowing,  with moderate to severe bilateral C6 foraminal stenosis. MRI LUMBAR SPINE IMPRESSION: 1. No acute abnormality within the lumbar spine. 2. Mild multilevel degenerative disc disease and facet hypertrophy for age without significant spinal stenosis. Mild lateral recess narrowing on the left at L2-3 and on the right at L3-4 and L4-5. 3. Pronounced thoracolumbar dextroscoliosis. Electronically Signed   By: Jeannine Boga M.D.   On: 03/09/2018 20:39   Mr Lumbar Spine Wo Contrast  Result Date: 03/09/2018 CLINICAL DATA:  Initial evaluation for abnormal gait, neck pain, generalized weakness. EXAM: MRI HEAD WITHOUT CONTRAST MRI CERVICAL AND LUMBAR SPINE WITHOUT CONTRAST TECHNIQUE: Multiplanar and multiecho pulse sequences of the cervical spine, to include the craniocervical junction and cervicothoracic junction, and lumbar spine, were obtained without intravenous contrast. COMPARISON:  None available. FINDINGS: MRI HEAD FINDINGS Brain: Cerebral volume within normal limits for patient age. Minimal T2/FLAIR hyperintensity within the periventricular white matter, nonspecific, but felt to be within normal limits for age. No abnormal foci of restricted diffusion to suggest acute or subacute ischemia. Gray-white matter differentiation well maintained. No encephalomalacia to suggest chronic infarction. No foci of  susceptibility artifact to suggest acute or chronic intracranial hemorrhage. Mass lesion, midline shift or mass effect. No hydrocephalus. No extra-axial fluid collection. Major dural sinuses are grossly patent. Pituitary gland and suprasellar region are normal. Midline structures intact and normal. Vascular: Major intracranial vascular flow voids well maintained and normal in appearance. Skull and upper cervical spine: Craniocervical junction normal. Visualized upper cervical spine within normal limits. Bone marrow signal intensity normal. No scalp soft tissue abnormality. Sinuses/Orbits: Globes and orbital soft tissues demonstrate no acute finding. Patient status post ocular lens replacement bilaterally. Mucous retention cyst noted within the right maxillary sinus. Paranasal sinuses are otherwise largely clear. No significant mastoid effusion. Inner ear structures grossly normal. Other: None. MRI CERVICAL SPINE FINDINGS Alignment: Exaggeration of the normal cervical lordosis. No listhesis. Vertebrae: Vertebral body heights maintained bone marrow signal intensity within normal limits. No discrete or worrisome osseous lesions. Mild reactive endplate changes present about the C4-5 through C6-7 interspaces. No other abnormal marrow edema. Cord: Signal intensity within the cervical spinal cord within normal limits. Posterior Fossa, vertebral arteries, paraspinal tissues: Craniocervical junction within normal limits. Paraspinous and prevertebral soft tissues normal. Normal intravascular flow voids present within the vertebral arteries bilaterally. Disc levels: C2-C3: Unremarkable. C3-C4: Shallow central disc protrusion indents the ventral thecal sac, contacting the ventral cord without cord deformity. Mild spinal stenosis. Bilateral uncovertebral hypertrophy with resultant mild to moderate right greater than left neural foraminal narrowing. C4-C5: Chronic intervertebral disc space narrowing with diffuse degenerative disc  osteophyte. Broad posterior component flattens and effaces the ventral thecal sac. Facet and ligament flavum hypertrophy. Resultant moderate spinal stenosis without significant cord deformity. Moderate bilateral C5 foraminal narrowing. C5-C6: Diffuse degenerative disc osteophyte with intervertebral disc space narrowing. Ligamentum flavum hypertrophy. Moderate spinal stenosis without cord deformity. Moderate to severe bilateral C6 foraminal narrowing. C6-C7: Diffuse degenerative disc osteophyte. Flattening and effacement of the ventral thecal sac. Ligamentum flavum thickening. Mild to moderate spinal stenosis. Moderate bilateral C7 foraminal narrowing. C7-T1:  Facet hypertrophy.  No significant stenosis. Visualized upper thoracic spine within normal limits. MRI LUMBAR SPINE FINDINGS Segmentation: Normal segmentation. Lowest well-formed disc labeled the L5-S1 level. Alignment: Extensive thoracolumbar dextroscoliosis. Trace retrolisthesis o T12 on L1 and L1 on L2. Vertebrae: Vertebral body heights maintained without evidence for acute or chronic fracture. Bone marrow signal intensity within normal limits. No discrete or worrisome osseous lesions. Reactive endplate changes  present about the L1-2 and L2-3 interspaces related to scoliotic curvature. Conus medullaris and cauda equina: Conus extends to the L1 level. Conus and cauda equina appear normal. Paraspinal and other soft tissues: Mild edema within the lower left paraspinous soft tissues (series 17, image 18), of uncertain significance, but could reflect muscular strain/injury. Possible myositis could also be considered. Paraspinous soft tissues demonstrate no other acute abnormality. Visualized visceral structures unremarkable. Disc levels: L1-2: Mild diffuse disc bulge. Left far lateral reactive endplate changes with endplate osteophytic spurring. Mild facet hypertrophy. No significant stenosis. L2-3: Mild diffuse disc bulge. Left far lateral reactive endplate  changes with endplate osteophytic spurring. Mild to moderate facet hypertrophy. Borderline mild left lateral recess narrowing without significant spinal stenosis. Mild left L2 foraminal narrowing. L3-4: Mild diffuse disc bulge with disc desiccation. Disc bulging slightly asymmetric to the right. Moderate facet and ligament flavum hypertrophy. Resultant mild right lateral recess narrowing without significant spinal stenosis. Foramina are patent. L4-5: Minimal disc bulge. Right far lateral reactive endplate changes. Moderate facet hypertrophy. Resultant mild to moderate right lateral recess narrowing without spinal stenosis. Mild right L4 foraminal narrowing. L5-S1: Diffuse disc bulge with intervertebral disc space narrowing. Chronic reactive endplate changes on the right. Bilateral facet hypertrophy. No significant stenosis or impingement. IMPRESSION: MRI HEAD IMPRESSION: Normal brain MRI for age. No acute intracranial abnormality identified. MRI CERVICAL SPINE IMPRESSION: 1. No acute abnormality within the cervical spine. 2. Moderate multilevel cervical spondylolysis with resultant mild to moderate diffuse spinal stenosis at C3-4 through C6-7, most pronounced at C5-6. 3. Multifactorial degenerative changes with resultant multilevel foraminal narrowing as above. Notable findings include moderate bilateral C4, C5, and C7 foraminal narrowing, with moderate to severe bilateral C6 foraminal stenosis. MRI LUMBAR SPINE IMPRESSION: 1. No acute abnormality within the lumbar spine. 2. Mild multilevel degenerative disc disease and facet hypertrophy for age without significant spinal stenosis. Mild lateral recess narrowing on the left at L2-3 and on the right at L3-4 and L4-5. 3. Pronounced thoracolumbar dextroscoliosis. Electronically Signed   By: Jeannine Boga M.D.   On: 03/09/2018 20:39   US Abdomen Complete  Result Date: 03/09/2018 CLINICAL DATA:  Abdominal pain for the past 2 days. History of colonic malignancy  with resection. EXAM: ABDOMEN ULTRASOUND COMPLETE COMPARISON:  No recent studies in PACs FINDINGS: Gallbladder: No gallstones or wall thickening visualized. No sonographic Murphy sign noted by sonographer. Common bile duct: Diameter: 3.5 mm Liver: No focal lesion identified. Within normal limits in parenchymal echogenicity. Portal vein is patent on color Doppler imaging with normal direction of blood flow towards the liver. IVC: No abnormality visualized. Pancreas: Visualized portion unremarkable. Spleen: Size and appearance within normal limits. Right Kidney: Length: 10.3 cm. Echogenicity within normal limits. No mass or hydronephrosis visualized. Left Kidney: Length: 10.0 cm. Echogenicity within normal limits. No mass or hydronephrosis visualized. Abdominal aorta: No aneurysm visualized. Other findings: There is no ascites. IMPRESSION: No gallstones or sonographic evidence of acute cholecystitis. No acute intra-abdominal abnormality is observed. Electronically Signed   By: David  Martinique M.D.   On: 03/09/2018 10:55       Phillips Climes M.D on 03/12/2018 at 2:55 PM  Between 7am to 7pm - Pager - [phone]  After 7pm go to [email or web address] - password [facility]  [facility] -  Office  [phone]

## 2018-03-12 NOTE — Clinical Social Work Note (Signed)
[facility] might have one discharge tomorrow from a semiprivate room. Patient's husband, daughter, and other family at bedside notified. They will accept the bed offer. CSW encouraged them to choose a second preference in case the individual in that room does not discharge tomorrow.  Dayton Scrape, [place]

## 2018-03-12 NOTE — Clinical Social Work Placement (Signed)
   CLINICAL SOCIAL WORK PLACEMENT  NOTE  Date:  03/12/2018  Patient Details  Name: Regina [name] MRN: [id number] Date of Birth: Aug 12, 1933  Clinical Social Work is seeking post-discharge placement for this patient at the [place] level of care (*CSW will initial, date and re-position this form in  chart as items are completed):  Yes   Patient/family provided with [place] Work Department's list of facilities offering this level of care within the geographic area requested by the patient (or if unable, by the patient's family).  Yes   Patient/family informed of their freedom to choose among providers that offer the needed level of care, that participate in Medicare, Medicaid or managed care program needed by the patient, have an available bed and are willing to accept the patient.  Yes   Patient/family informed of [place]'s ownership interest in [facility] and [facility], as well as of the fact that they are under no obligation to receive care at these facilities.  PASRR submitted to EDS on 03/12/18     PASRR number received on 03/12/18     Existing PASRR number confirmed on       FL2 transmitted to all facilities in geographic area requested by pt/family on 03/12/18     FL2 transmitted to all facilities within larger geographic area on       Patient informed that his/her managed care company has contracts with or will negotiate with certain facilities, including the following:            Patient/family informed of bed offers received.  Patient chooses bed at       Physician recommends and patient chooses bed at      Patient to be transferred to   on  .  Patient to be transferred to facility by       Patient family notified on   of transfer.  Name of family member notified:        PHYSICIAN Please sign FL2     Additional Comment:    _______________________________________________ Candie Chroman, LCSW 03/12/2018, 11:49  AM

## 2018-03-12 NOTE — Progress Notes (Signed)
OT Cancellation Note  Patient Details Name: Regina [name] MRN: [id number] DOB: Mar 15, 1933   Cancelled Treatment:    Reason Eval/Treat Not Completed: Medical issues which prohibited therapy.  Pt with decreased responsiveness. Rapid response with pt.    Kaelah Hayashi [place], OTR/L [phone]   Lucille Passy M 03/12/2018, 1:55 PM

## 2018-03-12 NOTE — Clinical Social Work Note (Signed)
Clinical Social Work Assessment  Patient Details  Name: Regina [name] MRN: [id number] Date of Birth: 1932/11/08  Date of referral:  03/12/18               Reason for consult:  Facility Placement, Discharge Planning                Permission sought to share information with:  Facility Sport and exercise psychologist, Family Supports Permission granted to share information::  Yes, Verbal Permission Granted  Name::     [facility]  Agency::  SNF's  Relationship::  Daughter  Contact Information:  [phone]  Housing/Transportation Living arrangements for the past 2 months:  Single Family Home Source of Information:  Patient, Medical Team, Adult Children Patient Interpreter Needed:  None Criminal Activity/Legal Involvement Pertinent to Current Situation/Hospitalization:  No - Comment as needed Significant Relationships:  Adult Children, Spouse Lives with:  Spouse Do you feel safe going back to the place where you live?  Yes Need for family participation in patient care:  Yes (Comment)  Care giving concerns:  PT recommending SNF once medically stable for discharge.   Social Worker assessment / plan:  CSW met with patient. Daughter at bedside. CSW introduced role and explained that PT recommendations would be discussed. Patient and daughter are agreeable to SNF. [facility] is first preference because it is very close to her home. Admissions coordinator will review referral and follow up. If they cannot take her, patient's daughter unsure of what second choice would be. CSW provided SNF list for review. No further concerns. CSW encouraged patient and her daughter to contact CSW as needed. CSW will continue to follow patient and her daughter for support and facilitate discharge to SNF once medically stable. Patient is on Medicare inpatient day 2 of 3.  Employment status:  Retired Forensic scientist:  Medicare PT Recommendations:  [place] / Referral to  community resources:  [place]  Patient/Family's Response to care:  Patient and her daughter agreeable to SNF placement. Patient's husband and daughter supportive and involved in patient's care. Patient and her daughter appreciated social work intervention.  Patient/Family's Understanding of and Emotional Response to Diagnosis, Current Treatment, and Prognosis:  Patient and her daughter have a good understanding of the reason for admission and her need for rehab prior to returning home. Patient and her daughter appear happy with hospital care.  Emotional Assessment Appearance:  Appears stated age Attitude/Demeanor/Rapport:  Engaged Affect (typically observed):  Accepting, Quiet Orientation:  Oriented to Self, Oriented to Place, Oriented to Situation Alcohol / Substance use:  Never Used Psych involvement (Current and /or in the community):  No (Comment)  Discharge Needs  Concerns to be addressed:  Care Coordination Readmission within the last 30 days:  No Current discharge risk:  Dependent with Mobility Barriers to Discharge:  Continued Medical Work up, [employer] inpatient day 2/3.)   Candie Chroman, LCSW 03/12/2018, 11:46 AM

## 2018-03-12 NOTE — Progress Notes (Signed)
Evening meds not given to patient due to her not been fully awake to have anything by mouth.

## 2018-03-12 NOTE — Progress Notes (Signed)
RN called to patient room for assistance getting patient back to bed , when arrive to patient room, patient was sitting in chair asleep, RN tried to wake patient up but was unresponsive, RN did sternal rub, cold rag on face but patient was still unresponsive. RN and tech manage to get patient on bed, did vitals with B/P in the 60/30's. Rapid response was called, MD was also called.

## 2018-03-12 NOTE — Plan of Care (Signed)
  Problem: Acute Rehab PT Goals(only PT should resolve) Goal: Pt Will Go Supine/Side To Sit Outcome: Completed/Met Goal: Patient Will Transfer Sit To/From Stand Outcome: Completed/Met Goal: Pt Will Ambulate Outcome: Completed/Met Goal: Pt/caregiver will Perform Home Exercise Program Outcome: Completed/Met   Problem: Malnutrition  (NI-5.2) Goal: Food and/or nutrient delivery Description Individualized approach for food/nutrient provision. Outcome: Completed/Met   Problem: Education: Goal: Knowledge of General Education information will improve Description Including pain rating scale, medication(s)/side effects and non-pharmacologic comfort measures Outcome: Completed/Met   Problem: Health Behavior/Discharge Planning: Goal: Ability to manage health-related needs will improve Outcome: Completed/Met   Problem: Clinical Measurements: Goal: Ability to maintain clinical measurements within normal limits will improve Outcome: Completed/Met Goal: Will remain free from infection Outcome: Completed/Met Goal: Diagnostic test results will improve Outcome: Completed/Met Goal: Respiratory complications will improve Outcome: Completed/Met Goal: Cardiovascular complication will be avoided Outcome: Completed/Met   Problem: Activity: Goal: Risk for activity intolerance will decrease Outcome: Completed/Met   Problem: Nutrition: Goal: Adequate nutrition will be maintained Outcome: Completed/Met   Problem: Coping: Goal: Level of anxiety will decrease Outcome: Completed/Met   Problem: Elimination: Goal: Will not experience complications related to bowel motility Outcome: Completed/Met Goal: Will not experience complications related to urinary retention Outcome: Completed/Met   Problem: Pain Managment: Goal: General experience of comfort will improve Outcome: Completed/Met   Problem: Safety: Goal: Ability to remain free from injury will improve Outcome: Completed/Met    Problem: Skin Integrity: Goal: Risk for impaired skin integrity will decrease Outcome: Completed/Met

## 2018-03-12 NOTE — Significant Event (Signed)
Rapid Response Event Note  Overview: Time Called: 1346 Arrival Time: 1348 Event Type: Hypotension  Initial Focused Assessment: Per RN patient has been alert and oriented today, preparing for DC.  She was sitting up in the chair for a with family in the room.  She started to feel weak and family asked for her to go back to bed.  Per Mikle Bosworth RN she was minimally responsive in the chair, she assisted her to the bed. BP 76/36  HR 62  RR 16  O2 sat 99% on RA Lung sounds decreased bases, regular heart tones.  Interventions: Placed pt flat 500cc NS bolus infused BP improved 114/49 HR 66 She still keeps her eyes closed but is able to answer questions appropriately and has equal grips and leg strength. Dr Waldron Labs at bedside to assess patient. 12 lead EKG   Plan of Care (if not transferred): Continue q 15 min BP for the next hour Check BP 32min to and hour after bolus complete MIVF at 75cc/hr Labs per MD orders  Event Summary: Name of Physician Notified: Elgergawy at 1345    at    Outcome: Stayed in room and stabalized  Event End Time: [place]  Raliegh Ip

## 2018-03-12 NOTE — Progress Notes (Signed)
Pt went to patient room to obtain ABG. Pt currently off floor having a procedure.  RT will check back.

## 2018-03-12 NOTE — NC FL2 (Signed)
[place] LEVEL OF CARE SCREENING TOOL     IDENTIFICATION  Patient Name: Regina [name] Birthdate: [birth year] Sex: female Admission Date (Current Location): 03/08/2018  [facility] and [place] Number:  Herbalist and Address:  [facility], [place] [address], [place], [place] [address]      Provider Number: [id number]  Attending Physician Name and Address:  Elgergawy, Silver Huguenin, MD  Relative Name and Phone Number:       Current Level of Care: Hospital Recommended Level of Care: [place] Prior Approval Number:    Date Approved/Denied:   PASRR Number: [id number]  Discharge Plan: SNF    Current Diagnoses: Patient Active Problem List   Diagnosis Date Noted  . Malnutrition of moderate degree 03/10/2018  . Acute on chronic diastolic CHF (congestive heart failure) ([place])   . Hip pain   . Acute cystitis without hematuria   . Delirium   . Memory loss   . Ataxia 03/09/2018  . Weakness generalized 03/09/2018  . Acute renal failure superimposed on chronic kidney disease ([place]) 03/09/2018  . Weakness 03/09/2018  . Internal hemorrhoids with complication 69/48/5462  . Ulcer of lower limb ([place]) 08/02/2014  . Chronic diastolic heart failure ([place]) 08/01/2014  . Right bundle branch block 08/01/2014  . Essential hypertension 08/01/2014  . Hypothyroidism 08/01/2014  . Rectal bleeding 07/31/2014  . Hypertension 07/31/2014  . GERD (gastroesophageal reflux disease) 07/31/2014  . Anemia due to other cause 07/31/2014  . Lower GI bleeding   . Faintness   . Syncope 07/30/2014  . Varicose veins of lower extremities with other complications 70/35/0093    Orientation RESPIRATION BLADDER Height & Weight     Self, Situation, Place  Normal Incontinent, External catheter Weight: 129 lb 3 oz (58.6 kg) Height:  5\' 6"  (167.6 cm)  BEHAVIORAL SYMPTOMS/MOOD NEUROLOGICAL BOWEL NUTRITION STATUS  Other (Comment)(Anxiety) (None) Continent  Diet(Heart healthy. Fluid restriction 1200 mL.)  AMBULATORY STATUS COMMUNICATION OF NEEDS Skin   Extensive Assist Verbally Other (Comment)(Cellulitis)                       Personal Care Assistance Level of Assistance              Functional Limitations Info  Sight, Hearing, Speech Sight Info: Adequate Hearing Info: Adequate Speech Info: Adequate    SPECIAL CARE FACTORS FREQUENCY  PT (By licensed PT), Blood pressure     PT Frequency: 5 x week              Contractures Contractures Info: Not present    Additional Factors Info  Code Status, Allergies Code Status Info: Full Allergies Info: Penicillins           Current Medications (03/12/2018):  This is the current hospital active medication list Current Facility-Administered Medications  Medication Dose Route Frequency Provider Last Rate Last Dose  . 0.9 %  sodium chloride infusion   Intravenous PRN Roney Jaffe, MD 10 mL/hr at 03/10/18 0619 250 mL at 03/10/18 0619  . acetaminophen (TYLENOL) tablet 650 mg  650 mg Oral Q6H PRN Rise Patience, MD   650 mg at 03/11/18 1227   Or  . acetaminophen (TYLENOL) suppository 650 mg  650 mg Rectal Q6H PRN Rise Patience, MD      . calcitRIOL (ROCALTROL) capsule 0.25 mcg  0.25 mcg Oral Daily Rise Patience, MD   0.25 mcg at 03/12/18 1016  . carvedilol (COREG) tablet  6.25 mg  6.25 mg Oral BID WC Rise Patience, MD   6.25 mg at 03/12/18 1016  . enoxaparin (LOVENOX) injection 30 mg  30 mg Subcutaneous Q24H Elgergawy, Silver Huguenin, MD   30 mg at 03/11/18 2157  . feeding supplement (ENSURE ENLIVE) (ENSURE ENLIVE) liquid 237 mL  237 mL Oral BID BM Roney Jaffe, MD   237 mL at 03/12/18 1027  . furosemide (LASIX) injection 20 mg  20 mg Intravenous Q12H Roney Jaffe, MD   20 mg at 03/12/18 0549  . hydrALAZINE (APRESOLINE) injection 5 mg  5 mg Intravenous Q4H PRN Rise Patience, MD      . levothyroxine (SYNTHROID, LEVOTHROID) tablet 88 mcg  88 mcg Oral  QAC breakfast Rise Patience, MD   88 mcg at 03/12/18 0549  . multivitamin with minerals tablet 1 tablet  1 tablet Oral Daily Elgergawy, Silver Huguenin, MD   1 tablet at 03/12/18 1016  . ondansetron (ZOFRAN) tablet 4 mg  4 mg Oral Q6H PRN Rise Patience, MD       Or  . ondansetron [facility]) injection 4 mg  4 mg Intravenous Q6H PRN Rise Patience, MD      . pantoprazole (PROTONIX) EC tablet 40 mg  40 mg Oral Daily Rise Patience, MD   40 mg at 03/12/18 1016  . polyvinyl alcohol (LIQUIFILM TEARS) 1.4 % ophthalmic solution 1 drop  1 drop Both Eyes PRN Roney Jaffe, MD   1 drop at 03/10/18 1702     Discharge Medications: Please see discharge summary for a list of discharge medications.  Relevant Imaging Results:  Relevant Lab Results:   Additional Information SS#: [id number]  Candie Chroman, LCSW

## 2018-03-13 LAB — CBC
HCT: 31 % — ABNORMAL LOW (ref 36.0–46.0)
Hemoglobin: 10.5 g/dL — ABNORMAL LOW (ref 12.0–15.0)
MCH: 31.9 pg (ref 26.0–34.0)
MCHC: 33.9 g/dL (ref 30.0–36.0)
MCV: 94.2 fL (ref 78.0–100.0)
Platelets: 244 10*3/uL (ref 150–400)
RBC: 3.29 MIL/uL — ABNORMAL LOW (ref 3.87–5.11)
RDW: 12.4 % (ref 11.5–15.5)
WBC: 13.7 10*3/uL — ABNORMAL HIGH (ref 4.0–10.5)

## 2018-03-13 LAB — BASIC METABOLIC PANEL
Anion gap: 9 (ref 5–15)
BUN: 21 mg/dL (ref 8–23)
CALCIUM: 8.7 mg/dL — AB (ref 8.9–10.3)
CO2: 28 mmol/L (ref 22–32)
CREATININE: 1.09 mg/dL — AB (ref 0.44–1.00)
Chloride: 95 mmol/L — ABNORMAL LOW (ref 98–111)
GFR calc non Af Amer: 46 mL/min — ABNORMAL LOW (ref >60)
GFR, EST AFRICAN AMERICAN: 53 mL/min — AB (ref >60)
Glucose, Bld: 150 mg/dL — ABNORMAL HIGH (ref 70–99)
Potassium: 4.1 mmol/L (ref 3.5–5.1)
SODIUM: 132 mmol/L — AB (ref 135–145)

## 2018-03-13 LAB — PROCALCITONIN: Procalcitonin: 0.18 ng/mL

## 2018-03-13 MED ORDER — ACETAMINOPHEN 325 MG PO TABS: 650.0000 mg | ORAL_TABLET | Freq: Four times a day (QID) | ORAL | Status: DC | PRN

## 2018-03-13 MED ORDER — FUROSEMIDE 40 MG PO TABS: ORAL_TABLET | ORAL | 11 refills | Status: DC

## 2018-03-13 MED ORDER — CARVEDILOL 3.125 MG PO TABS: 3.1250 mg | ORAL_TABLET | Freq: Two times a day (BID) | ORAL | 11 refills | Status: DC

## 2018-03-13 MED ORDER — ENSURE ENLIVE PO LIQD
237.0000 mL | Freq: Two times a day (BID) | ORAL | 12 refills | Status: DC
Start: 2018-03-13 — End: 2018-04-22

## 2018-03-13 MED ORDER — FUROSEMIDE 40 MG PO TABS: 40.0000 mg | ORAL_TABLET | Freq: Every day | ORAL | Status: DC

## 2018-03-13 MED ORDER — LEVOFLOXACIN 250 MG PO TABS: 250.0000 mg | ORAL_TABLET | Freq: Every day | ORAL | 0 refills | Status: AC

## 2018-03-13 MED ORDER — ADULT MULTIVITAMIN W/MINERALS CH: 1.0000 | ORAL_TABLET | Freq: Every day | ORAL | Status: DC

## 2018-03-13 NOTE — Clinical Social Work Note (Signed)
[facility] SNF will have a bed for patient today if still stable for discharge. CSW notified patient, daughter, and MD.  Dayton Scrape, [place] [phone]

## 2018-03-13 NOTE — Progress Notes (Signed)
Patient is alert and oriented with husband at the bedside, vitals stable eating independently in bed.

## 2018-03-13 NOTE — Clinical Social Work Placement (Signed)
   CLINICAL SOCIAL WORK PLACEMENT  NOTE  Date:  03/13/2018  Patient Details  Name: Regina [name] MRN: [id number] Date of Birth: 1932-12-05  Clinical Social Work is seeking post-discharge placement for this patient at the [place] level of care (*CSW will initial, date and re-position this form in  chart as items are completed):  Yes   Patient/family provided with [place] Work Department's list of facilities offering this level of care within the geographic area requested by the patient (or if unable, by the patient's family).  Yes   Patient/family informed of their freedom to choose among providers that offer the needed level of care, that participate in Medicare, Medicaid or managed care program needed by the patient, have an available bed and are willing to accept the patient.  Yes   Patient/family informed of [facility]'s ownership interest in [facility] and [facility], as well as of the fact that they are under no obligation to receive care at these facilities.  PASRR submitted to EDS on 03/12/18     PASRR number received on 03/12/18     Existing PASRR number confirmed on       FL2 transmitted to all facilities in geographic area requested by pt/family on 03/12/18     FL2 transmitted to all facilities within larger geographic area on       Patient informed that his/her managed care company has contracts with or will negotiate with certain facilities, including the following:        Yes   Patient/family informed of bed offers received.  Patient chooses bed at [place] [place]     Physician recommends and patient chooses bed at      Patient to be transferred to [place], [place] on 03/13/18.  Patient to be transferred to facility by Family will transport by car     Patient family notified on 03/13/18 of transfer.  Name of family member notified:  [facility]     PHYSICIAN Please prepare prescriptions      Additional Comment:    _______________________________________________ Candie Chroman, LCSW 03/13/2018, 1:17 PM

## 2018-03-13 NOTE — Discharge Summary (Signed)
Regina [name], is a 84 y.o. female  DOB 1932/11/02  MRN [id number].  Admission date:  03/08/2018  Admitting Physician  Rise Patience, MD  Discharge Date:  03/13/2018   Primary MD  [name], MD  Recommendations for primary care physician for things to follow:  -Check CBC, BMP in 3 days -Continue to monitor volume status closely, her Lasix has been held for next 3 days, and then to be resumed at a lower dose, and on as as needed basis as well. -Patient will need follow-up with goal for neurology outpatient dementia clinic.   Admission Diagnosis  Ataxia [R27.0] Hip pain [M25.559] Acute renal failure superimposed on chronic kidney disease, unspecified CKD stage, unspecified acute renal failure type ([place]) [N17.9, N18.9] Acute on chronic right-sided congestive heart failure ([place]) [I50.813]   Discharge Diagnosis  Ataxia [R27.0] Hip pain [M25.559] Acute renal failure superimposed on chronic kidney disease, unspecified CKD stage, unspecified acute renal failure type ([place]) [N17.9, N18.9] Acute on chronic right-sided congestive heart failure (HCC) [I50.813]    Principal Problem:   Delirium Active Problems:   Chronic diastolic heart failure (HCC)   Essential hypertension   Hypothyroidism   Ataxia   Weakness generalized   Acute renal failure superimposed on chronic kidney disease (HCC)   Weakness   Malnutrition of moderate degree   Acute on chronic diastolic CHF (congestive heart failure) (HCC)   Hip pain   Acute cystitis without hematuria   Memory loss      Past Medical History:  Diagnosis Date  . Allergy   . Barrett esophagus 2010  . Cancer ([place])   . GERD (gastroesophageal reflux disease)   . Hypertension   . Thyroid disease     Past Surgical History:  Procedure Laterality Date  . APPENDECTOMY    . COLON SURGERY    . COLONOSCOPY    . COLONOSCOPY N/A 08/03/2014   Procedure:  COLONOSCOPY;  Surgeon: Inda Castle, MD;  Location: [facility];  Service: Endoscopy;  Laterality: N/A;  . POLYPECTOMY    . TONSILLECTOMY    . TOTAL THYROIDECTOMY    . TUBAL LIGATION         History of present illness and  Hospital Course:     Kindly see H&P for history of present illness and admission details, please review complete Labs, Consult reports and Test reports for all details in brief  HPI  from the history and physical done on the day of admission 03/08/2018   HPI: Regina [name] is a 84 y.o. female with history of diastolic CHF, hypertension, hypothyroidism, colon cancer status post resection in remission was brought to the ER after patient was found to have increasing difficulty ambulating with imbalance.  Patient's family states that they were in beats over the last 1 week patient has not come out of her room because of the difficulty walking.  Prior to going to the beach patient had a epidural injection in the lumbar area for chronic low back pain.  Since then patient  has been gradually worsening over the last 2 weeks.  Increasing weakness difficult ambulating.  Denies any fever chills or incontinence of urine or bowels.  Denies any weakness of the upper extremities.  When patient tries to walk she feels very weak and her legs give away.  Denies any chest pain shortness of breath nausea vomiting diarrhea.patient has been having poor appetite.  Over the last few days as per the patient's husband was at the bedside patient has become increasingly confused and has taken more than her usual dose of Lasix and some of her other medications to.  ED Course: In the ER her creatinine is found to be around 1.3 the last one in our system was in 2015 which was normal.  Patient states she also was recently started on calcitriol by her physician 3 months ago.  Hemoglobin is stable at baseline.  On exam patient has bilateral lower extremity edema with erythema of the both lower extremity  which patient's family states is chronic but has increased.  Patient was not orthostatic.  Patient states he has bilateral hip pain when she tries to walk in addition to the weakness.  Patient has good deep tendon reflexes on exam.  UA showing possibility of UTI.  Patient complains of bilateral flank pain.    Hospital Course   Regina [name] a 84 y.o.femalewithhistory of diastolic CHF, hypertension, hypothyroidism, colon cancer status post resection in remission was brought to the ER after patient was found to have increasing difficulty ambulating with imbalance.   As well she did have confusion, and significant delirium during hospital stay, her workup was significant for acute on chronic CHF, and UTI, mentation significantly improved with UTI treatment, on 03/12/2018, patient had an episode of rapid response secondary to hypotension and increased lethargy, most likely from overdiuresis, troponins, EKG nonacute, procalcitonin within normal limits, CT head was with no acute finding.     Generalized weakness/difficulty ambulating:  - suspect multifactorial, UTI and acute CHF w/ lower ext edema.  Significantly improved during hospital stay, she remains pretty weak though, so recommendation for SNF placement. - MRI C-spine and L-spine show some disease but no severe spinal stenosis   Metabolic encephalopathy -Patient presents with increased confusion, most likely in the setting of UTI, CHF, recent beach trip for 1 week, and there is very likely underlying early dementia as well. -  MRI brain with no acute findings, mentation significantly improved today with treatment of her UTI. - MRI brain is negative - TSH and B12 are within normal limits -Discussed with daughter, recommendation for outpatient dementia clinic follow-up  Acute renal failure:  - creat up 1.38 admission, resolved, creatinine back to baseline  Bilateral hip pain:  - most likely due to arthritis,plain film pelvis xray  w/o fracture  Hypothyroidism  -on Synthroid.  TSH within normal limit  Acute on chronic diastolic CHF: - last EF measured 2015 was 55-60%w G1DD -Initially on IV diuresis, with improvement of volume status, but he became lethargic, hypotensive yesterday, most likely from overdiuresis, he did require some IV fluids overnight, currently appears to be euvolemic at time of discharge, he is on Lasix 40 mg oral twice daily at home, my recommendation is to hold all diuresis for next 3 days, and then to resume only scheduled 40 mg Lasix in the morning, and to keep evening dose on as needed basis, this to be closely monitored by physician at facility and diuresis to be adjusted according to her volume status.  Hypertension: -  Patient with soft blood pressure, carb has been stopped, Coreg has been decreased by half  Chronic anemia-globin stable UTI -  Urine culture growing Enterococcus, she has allergy to penicillin, switched Cipro to levofloxacin as discussed with ID Dr. Drucilla Schmidt,  -With rapid response yesterday, but she was afebrile, procalcitonin within normal limit, leukocytosis has worsened to 13.7 today, will continue with levofloxacin for next 3 days   Moderate malnutrition -Continue with supplement     Discharge Condition: Stable   Follow UP   Contact information for follow-up providers    [facility] Follow up in 4 week(s).   Why:  Outpatient dementia clinic Contact information: [address]     [place] [address] [phone]           Contact information for after-discharge care    Destination    [facility] Preferred SNF .   Service:  Skilled Nursing Contact information: [place] [phone]                    Discharge Instructions  and  Discharge Medications     Discharge Instructions    Discharge instructions   Complete by:   As directed    Follow with Primary MD [name], MD or SNF physician in 3 days  Get CBC, CMP,checked  by Primary MD next visit.    Activity: As tolerated with Full fall precautions use walker/cane & assistance as needed   Disposition SNF   Diet: Heart Healthy  , with feeding assistance and aspiration precautions.  For Heart failure patients - Check your Weight same time everyday, if you gain over 2 pounds, or you develop in leg swelling, experience more shortness of breath or chest pain, call your Primary MD immediately. Follow Cardiac Low Salt Diet and 1.5 lit/day fluid restriction.   On your next visit with your primary care physician please Get Medicines reviewed and adjusted.   Please request your Prim.MD to go over all Hospital Tests and Procedure/Radiological results at the follow up, please get all Hospital records sent to your Prim MD by signing hospital release before you go home.   If you experience worsening of your admission symptoms, develop shortness of breath, life threatening emergency, suicidal or homicidal thoughts you must seek medical attention immediately by calling 911 or calling your MD immediately  if symptoms less severe.  You Must read complete instructions/literature along with all the possible adverse reactions/side effects for all the Medicines you take and that have been prescribed to you. Take any new Medicines after you have completely understood and accpet all the possible adverse reactions/side effects.   Do not drive, operating heavy machinery, perform activities at heights, swimming or participation in water activities or provide baby sitting services if your were admitted for syncope or siezures until you have seen by Primary MD or a Neurologist and advised to do so again.  Do not drive when taking Pain medications.    Do not take more than prescribed Pain, Sleep and Anxiety Medications  Special Instructions: If you have smoked or chewed  Tobacco  in the last 2 yrs please stop smoking, stop any regular Alcohol  and or any Recreational drug use.  Wear Seat belts while driving.   Please note  You were cared for by a hospitalist during your hospital stay. If you have any questions about your discharge medications or the care you received while you were  in the hospital after you are discharged, you can call the unit and asked to speak with the hospitalist on call if the hospitalist that took care of you is not available. Once you are discharged, your primary care physician will handle any further medical issues. Please note that NO REFILLS for any discharge medications will be authorized once you are discharged, as it is imperative that you return to your primary care physician (or establish a relationship with a primary care physician if you do not have one) for your aftercare needs so that they can reassess your need for medications and monitor your lab values.   Increase activity slowly   Complete by:  As directed      Allergies as of 03/13/2018      Reactions   Penicillins Hives, Itching      Medication List    STOP taking these medications   telmisartan 80 MG tablet Commonly known as:  MICARDIS     TAKE these medications   acetaminophen 325 MG tablet Commonly known as:  TYLENOL Take 2 tablets (650 mg total) by mouth every 6 (six) hours as needed for mild pain (or Fever >/= 101).   calcitRIOL 0.25 MCG capsule Commonly known as:  ROCALTROL Take 0.25 mcg by mouth daily.   carvedilol 3.125 MG tablet Commonly known as:  COREG Take 1 tablet (3.125 mg total) by mouth 2 (two) times daily. What changed:    medication strength  how much to take  when to take this   esomeprazole 40 MG capsule Commonly known as:  NEXIUM Take 40 mg by mouth daily as needed (heartburn).   feeding supplement (ENSURE ENLIVE) Liqd Take 237 mLs by mouth 2 (two) times daily between meals.   furosemide 40 MG tablet Commonly known as:   LASIX Please take 40 mg QPM  as needed for volume overload or leg edema What changed:  You were already taking a medication with the same name, and this prescription was added. Make sure you understand how and when to take each.   furosemide 40 MG tablet Commonly known as:  LASIX Take 1 tablet (40 mg total) by mouth daily. Please start Monday, 03/16/2018 Start taking on:  03/16/2018 What changed:    when to take this  additional instructions  These instructions start on 03/16/2018. If you are unsure what to do until then, ask your doctor or other care provider.   levofloxacin 250 MG tablet Commonly known as:  LEVAQUIN Take 1 tablet (250 mg total) by mouth daily for 3 days. Please take daily for 3 days then stop Start taking on:  03/14/2018   levothyroxine 88 MCG tablet Commonly known as:  SYNTHROID, LEVOTHROID Take 88 mcg by mouth daily before breakfast.   multivitamin with minerals Tabs tablet Take 1 tablet by mouth daily. Start taking on:  03/14/2018   REFRESH OP Apply 1 drop to eye daily as needed (dry eyes).         Diet and Activity recommendation: See Discharge Instructions above   Consults obtained -  None   Major procedures and Radiology Reports - PLEASE review detailed and final reports for all details, in brief -     Dg Pelvis 1-2 Views  Result Date: 03/09/2018 CLINICAL DATA:  Bilateral hip pain. EXAM: PELVIS - 1-2 VIEW COMPARISON:  None. FINDINGS: The cortical margins of the bony pelvis are intact. No fracture. Pubic symphysis and sacroiliac joints are congruent. Both femoral heads are well-seated in the respective acetabula.  The joint spaces are preserved. Degenerative disc disease at L4-L5, partially included. IMPRESSION: 1. No acute abnormality. 2. L4-L5 degenerative change, partially included. Electronically Signed   By: Jeb Levering M.D.   On: 03/09/2018 05:47   Ct Head Wo Contrast  Result Date: 03/12/2018 CLINICAL DATA:  Altered level of consciousness,  increasing difficulty with ambulation, imbalance, confusion, history CHF, hypertension, colon cancer EXAM: CT HEAD WITHOUT CONTRAST TECHNIQUE: Contiguous axial images were obtained from the base of the skull through the vertex without intravenous contrast. Sagittal and coronal MPR images reconstructed from axial data set. COMPARISON:  None FINDINGS: Brain: Generalized atrophy. Normal ventricular morphology. No midline shift or mass effect. Small vessel chronic ischemic changes of deep cerebral white matter. Beam hardening artifacts at skull base especially at RIGHT cerebellum and RIGHT occipital lobe. No intracranial hemorrhage, mass lesion, evidence of acute infarction, or extra-axial fluid collection. Vascular: Atherosclerotic calcifications within internal carotid arteries at skull base Skull: Intact Sinuses/Orbits: Small mucosal retention cyst versus polyp in RIGHT maxillary sinus. Other: N/A IMPRESSION: Atrophy with small vessel chronic ischemic changes of deep cerebral white matter. No definite acute intracranial abnormalities. Electronically Signed   By: Lavonia Dana M.D.   On: 03/12/2018 14:59   Mr Brain Wo Contrast  Result Date: 03/09/2018 CLINICAL DATA:  Initial evaluation for abnormal gait, neck pain, generalized weakness. EXAM: MRI HEAD WITHOUT CONTRAST MRI CERVICAL AND LUMBAR SPINE WITHOUT CONTRAST TECHNIQUE: Multiplanar and multiecho pulse sequences of the cervical spine, to include the craniocervical junction and cervicothoracic junction, and lumbar spine, were obtained without intravenous contrast. COMPARISON:  None available. FINDINGS: MRI HEAD FINDINGS Brain: Cerebral volume within normal limits for patient age. Minimal T2/FLAIR hyperintensity within the periventricular white matter, nonspecific, but felt to be within normal limits for age. No abnormal foci of restricted diffusion to suggest acute or subacute ischemia. Gray-white matter differentiation well maintained. No encephalomalacia to  suggest chronic infarction. No foci of susceptibility artifact to suggest acute or chronic intracranial hemorrhage. Mass lesion, midline shift or mass effect. No hydrocephalus. No extra-axial fluid collection. Major dural sinuses are grossly patent. Pituitary gland and suprasellar region are normal. Midline structures intact and normal. Vascular: Major intracranial vascular flow voids well maintained and normal in appearance. Skull and upper cervical spine: Craniocervical junction normal. Visualized upper cervical spine within normal limits. Bone marrow signal intensity normal. No scalp soft tissue abnormality. Sinuses/Orbits: Globes and orbital soft tissues demonstrate no acute finding. Patient status post ocular lens replacement bilaterally. Mucous retention cyst noted within the right maxillary sinus. Paranasal sinuses are otherwise largely clear. No significant mastoid effusion. Inner ear structures grossly normal. Other: None. MRI CERVICAL SPINE FINDINGS Alignment: Exaggeration of the normal cervical lordosis. No listhesis. Vertebrae: Vertebral body heights maintained bone marrow signal intensity within normal limits. No discrete or worrisome osseous lesions. Mild reactive endplate changes present about the C4-5 through C6-7 interspaces. No other abnormal marrow edema. Cord: Signal intensity within the cervical spinal cord within normal limits. Posterior Fossa, vertebral arteries, paraspinal tissues: Craniocervical junction within normal limits. Paraspinous and prevertebral soft tissues normal. Normal intravascular flow voids present within the vertebral arteries bilaterally. Disc levels: C2-C3: Unremarkable. C3-C4: Shallow central disc protrusion indents the ventral thecal sac, contacting the ventral cord without cord deformity. Mild spinal stenosis. Bilateral uncovertebral hypertrophy with resultant mild to moderate right greater than left neural foraminal narrowing. C4-C5: Chronic intervertebral disc space  narrowing with diffuse degenerative disc osteophyte. Broad posterior component flattens and effaces the ventral thecal sac. Facet and ligament  flavum hypertrophy. Resultant moderate spinal stenosis without significant cord deformity. Moderate bilateral C5 foraminal narrowing. C5-C6: Diffuse degenerative disc osteophyte with intervertebral disc space narrowing. Ligamentum flavum hypertrophy. Moderate spinal stenosis without cord deformity. Moderate to severe bilateral C6 foraminal narrowing. C6-C7: Diffuse degenerative disc osteophyte. Flattening and effacement of the ventral thecal sac. Ligamentum flavum thickening. Mild to moderate spinal stenosis. Moderate bilateral C7 foraminal narrowing. C7-T1:  Facet hypertrophy.  No significant stenosis. Visualized upper thoracic spine within normal limits. MRI LUMBAR SPINE FINDINGS Segmentation: Normal segmentation. Lowest well-formed disc labeled the L5-S1 level. Alignment: Extensive thoracolumbar dextroscoliosis. Trace retrolisthesis o T12 on L1 and L1 on L2. Vertebrae: Vertebral body heights maintained without evidence for acute or chronic fracture. Bone marrow signal intensity within normal limits. No discrete or worrisome osseous lesions. Reactive endplate changes present about the L1-2 and L2-3 interspaces related to scoliotic curvature. Conus medullaris and cauda equina: Conus extends to the L1 level. Conus and cauda equina appear normal. Paraspinal and other soft tissues: Mild edema within the lower left paraspinous soft tissues (series 17, image 18), of uncertain significance, but could reflect muscular strain/injury. Possible myositis could also be considered. Paraspinous soft tissues demonstrate no other acute abnormality. Visualized visceral structures unremarkable. Disc levels: L1-2: Mild diffuse disc bulge. Left far lateral reactive endplate changes with endplate osteophytic spurring. Mild facet hypertrophy. No significant stenosis. L2-3: Mild diffuse disc  bulge. Left far lateral reactive endplate changes with endplate osteophytic spurring. Mild to moderate facet hypertrophy. Borderline mild left lateral recess narrowing without significant spinal stenosis. Mild left L2 foraminal narrowing. L3-4: Mild diffuse disc bulge with disc desiccation. Disc bulging slightly asymmetric to the right. Moderate facet and ligament flavum hypertrophy. Resultant mild right lateral recess narrowing without significant spinal stenosis. Foramina are patent. L4-5: Minimal disc bulge. Right far lateral reactive endplate changes. Moderate facet hypertrophy. Resultant mild to moderate right lateral recess narrowing without spinal stenosis. Mild right L4 foraminal narrowing. L5-S1: Diffuse disc bulge with intervertebral disc space narrowing. Chronic reactive endplate changes on the right. Bilateral facet hypertrophy. No significant stenosis or impingement. IMPRESSION: MRI HEAD IMPRESSION: Normal brain MRI for age. No acute intracranial abnormality identified. MRI CERVICAL SPINE IMPRESSION: 1. No acute abnormality within the cervical spine. 2. Moderate multilevel cervical spondylolysis with resultant mild to moderate diffuse spinal stenosis at C3-4 through C6-7, most pronounced at C5-6. 3. Multifactorial degenerative changes with resultant multilevel foraminal narrowing as above. Notable findings include moderate bilateral C4, C5, and C7 foraminal narrowing, with moderate to severe bilateral C6 foraminal stenosis. MRI LUMBAR SPINE IMPRESSION: 1. No acute abnormality within the lumbar spine. 2. Mild multilevel degenerative disc disease and facet hypertrophy for age without significant spinal stenosis. Mild lateral recess narrowing on the left at L2-3 and on the right at L3-4 and L4-5. 3. Pronounced thoracolumbar dextroscoliosis. Electronically Signed   By: Jeannine Boga M.D.   On: 03/09/2018 20:39   Mr Cervical Spine Wo Contrast  Result Date: 03/09/2018 CLINICAL DATA:  Initial  evaluation for abnormal gait, neck pain, generalized weakness. EXAM: MRI HEAD WITHOUT CONTRAST MRI CERVICAL AND LUMBAR SPINE WITHOUT CONTRAST TECHNIQUE: Multiplanar and multiecho pulse sequences of the cervical spine, to include the craniocervical junction and cervicothoracic junction, and lumbar spine, were obtained without intravenous contrast. COMPARISON:  None available. FINDINGS: MRI HEAD FINDINGS Brain: Cerebral volume within normal limits for patient age. Minimal T2/FLAIR hyperintensity within the periventricular white matter, nonspecific, but felt to be within normal limits for age. No abnormal foci of restricted diffusion to suggest  acute or subacute ischemia. Gray-white matter differentiation well maintained. No encephalomalacia to suggest chronic infarction. No foci of susceptibility artifact to suggest acute or chronic intracranial hemorrhage. Mass lesion, midline shift or mass effect. No hydrocephalus. No extra-axial fluid collection. Major dural sinuses are grossly patent. Pituitary gland and suprasellar region are normal. Midline structures intact and normal. Vascular: Major intracranial vascular flow voids well maintained and normal in appearance. Skull and upper cervical spine: Craniocervical junction normal. Visualized upper cervical spine within normal limits. Bone marrow signal intensity normal. No scalp soft tissue abnormality. Sinuses/Orbits: Globes and orbital soft tissues demonstrate no acute finding. Patient status post ocular lens replacement bilaterally. Mucous retention cyst noted within the right maxillary sinus. Paranasal sinuses are otherwise largely clear. No significant mastoid effusion. Inner ear structures grossly normal. Other: None. MRI CERVICAL SPINE FINDINGS Alignment: Exaggeration of the normal cervical lordosis. No listhesis. Vertebrae: Vertebral body heights maintained bone marrow signal intensity within normal limits. No discrete or worrisome osseous lesions. Mild reactive  endplate changes present about the C4-5 through C6-7 interspaces. No other abnormal marrow edema. Cord: Signal intensity within the cervical spinal cord within normal limits. Posterior Fossa, vertebral arteries, paraspinal tissues: Craniocervical junction within normal limits. Paraspinous and prevertebral soft tissues normal. Normal intravascular flow voids present within the vertebral arteries bilaterally. Disc levels: C2-C3: Unremarkable. C3-C4: Shallow central disc protrusion indents the ventral thecal sac, contacting the ventral cord without cord deformity. Mild spinal stenosis. Bilateral uncovertebral hypertrophy with resultant mild to moderate right greater than left neural foraminal narrowing. C4-C5: Chronic intervertebral disc space narrowing with diffuse degenerative disc osteophyte. Broad posterior component flattens and effaces the ventral thecal sac. Facet and ligament flavum hypertrophy. Resultant moderate spinal stenosis without significant cord deformity. Moderate bilateral C5 foraminal narrowing. C5-C6: Diffuse degenerative disc osteophyte with intervertebral disc space narrowing. Ligamentum flavum hypertrophy. Moderate spinal stenosis without cord deformity. Moderate to severe bilateral C6 foraminal narrowing. C6-C7: Diffuse degenerative disc osteophyte. Flattening and effacement of the ventral thecal sac. Ligamentum flavum thickening. Mild to moderate spinal stenosis. Moderate bilateral C7 foraminal narrowing. C7-T1:  Facet hypertrophy.  No significant stenosis. Visualized upper thoracic spine within normal limits. MRI LUMBAR SPINE FINDINGS Segmentation: Normal segmentation. Lowest well-formed disc labeled the L5-S1 level. Alignment: Extensive thoracolumbar dextroscoliosis. Trace retrolisthesis o T12 on L1 and L1 on L2. Vertebrae: Vertebral body heights maintained without evidence for acute or chronic fracture. Bone marrow signal intensity within normal limits. No discrete or worrisome osseous  lesions. Reactive endplate changes present about the L1-2 and L2-3 interspaces related to scoliotic curvature. Conus medullaris and cauda equina: Conus extends to the L1 level. Conus and cauda equina appear normal. Paraspinal and other soft tissues: Mild edema within the lower left paraspinous soft tissues (series 17, image 18), of uncertain significance, but could reflect muscular strain/injury. Possible myositis could also be considered. Paraspinous soft tissues demonstrate no other acute abnormality. Visualized visceral structures unremarkable. Disc levels: L1-2: Mild diffuse disc bulge. Left far lateral reactive endplate changes with endplate osteophytic spurring. Mild facet hypertrophy. No significant stenosis. L2-3: Mild diffuse disc bulge. Left far lateral reactive endplate changes with endplate osteophytic spurring. Mild to moderate facet hypertrophy. Borderline mild left lateral recess narrowing without significant spinal stenosis. Mild left L2 foraminal narrowing. L3-4: Mild diffuse disc bulge with disc desiccation. Disc bulging slightly asymmetric to the right. Moderate facet and ligament flavum hypertrophy. Resultant mild right lateral recess narrowing without significant spinal stenosis. Foramina are patent. L4-5: Minimal disc bulge. Right far lateral reactive endplate changes.  Moderate facet hypertrophy. Resultant mild to moderate right lateral recess narrowing without spinal stenosis. Mild right L4 foraminal narrowing. L5-S1: Diffuse disc bulge with intervertebral disc space narrowing. Chronic reactive endplate changes on the right. Bilateral facet hypertrophy. No significant stenosis or impingement. IMPRESSION: MRI HEAD IMPRESSION: Normal brain MRI for age. No acute intracranial abnormality identified. MRI CERVICAL SPINE IMPRESSION: 1. No acute abnormality within the cervical spine. 2. Moderate multilevel cervical spondylolysis with resultant mild to moderate diffuse spinal stenosis at C3-4 through  C6-7, most pronounced at C5-6. 3. Multifactorial degenerative changes with resultant multilevel foraminal narrowing as above. Notable findings include moderate bilateral C4, C5, and C7 foraminal narrowing, with moderate to severe bilateral C6 foraminal stenosis. MRI LUMBAR SPINE IMPRESSION: 1. No acute abnormality within the lumbar spine. 2. Mild multilevel degenerative disc disease and facet hypertrophy for age without significant spinal stenosis. Mild lateral recess narrowing on the left at L2-3 and on the right at L3-4 and L4-5. 3. Pronounced thoracolumbar dextroscoliosis. Electronically Signed   By: Jeannine Boga M.D.   On: 03/09/2018 20:39   Mr Lumbar Spine Wo Contrast  Result Date: 03/09/2018 CLINICAL DATA:  Initial evaluation for abnormal gait, neck pain, generalized weakness. EXAM: MRI HEAD WITHOUT CONTRAST MRI CERVICAL AND LUMBAR SPINE WITHOUT CONTRAST TECHNIQUE: Multiplanar and multiecho pulse sequences of the cervical spine, to include the craniocervical junction and cervicothoracic junction, and lumbar spine, were obtained without intravenous contrast. COMPARISON:  None available. FINDINGS: MRI HEAD FINDINGS Brain: Cerebral volume within normal limits for patient age. Minimal T2/FLAIR hyperintensity within the periventricular white matter, nonspecific, but felt to be within normal limits for age. No abnormal foci of restricted diffusion to suggest acute or subacute ischemia. Gray-white matter differentiation well maintained. No encephalomalacia to suggest chronic infarction. No foci of susceptibility artifact to suggest acute or chronic intracranial hemorrhage. Mass lesion, midline shift or mass effect. No hydrocephalus. No extra-axial fluid collection. Major dural sinuses are grossly patent. Pituitary gland and suprasellar region are normal. Midline structures intact and normal. Vascular: Major intracranial vascular flow voids well maintained and normal in appearance. Skull and upper cervical  spine: Craniocervical junction normal. Visualized upper cervical spine within normal limits. Bone marrow signal intensity normal. No scalp soft tissue abnormality. Sinuses/Orbits: Globes and orbital soft tissues demonstrate no acute finding. Patient status post ocular lens replacement bilaterally. Mucous retention cyst noted within the right maxillary sinus. Paranasal sinuses are otherwise largely clear. No significant mastoid effusion. Inner ear structures grossly normal. Other: None. MRI CERVICAL SPINE FINDINGS Alignment: Exaggeration of the normal cervical lordosis. No listhesis. Vertebrae: Vertebral body heights maintained bone marrow signal intensity within normal limits. No discrete or worrisome osseous lesions. Mild reactive endplate changes present about the C4-5 through C6-7 interspaces. No other abnormal marrow edema. Cord: Signal intensity within the cervical spinal cord within normal limits. Posterior Fossa, vertebral arteries, paraspinal tissues: Craniocervical junction within normal limits. Paraspinous and prevertebral soft tissues normal. Normal intravascular flow voids present within the vertebral arteries bilaterally. Disc levels: C2-C3: Unremarkable. C3-C4: Shallow central disc protrusion indents the ventral thecal sac, contacting the ventral cord without cord deformity. Mild spinal stenosis. Bilateral uncovertebral hypertrophy with resultant mild to moderate right greater than left neural foraminal narrowing. C4-C5: Chronic intervertebral disc space narrowing with diffuse degenerative disc osteophyte. Broad posterior component flattens and effaces the ventral thecal sac. Facet and ligament flavum hypertrophy. Resultant moderate spinal stenosis without significant cord deformity. Moderate bilateral C5 foraminal narrowing. C5-C6: Diffuse degenerative disc osteophyte with intervertebral disc space narrowing. Ligamentum  flavum hypertrophy. Moderate spinal stenosis without cord deformity. Moderate to  severe bilateral C6 foraminal narrowing. C6-C7: Diffuse degenerative disc osteophyte. Flattening and effacement of the ventral thecal sac. Ligamentum flavum thickening. Mild to moderate spinal stenosis. Moderate bilateral C7 foraminal narrowing. C7-T1:  Facet hypertrophy.  No significant stenosis. Visualized upper thoracic spine within normal limits. MRI LUMBAR SPINE FINDINGS Segmentation: Normal segmentation. Lowest well-formed disc labeled the L5-S1 level. Alignment: Extensive thoracolumbar dextroscoliosis. Trace retrolisthesis o T12 on L1 and L1 on L2. Vertebrae: Vertebral body heights maintained without evidence for acute or chronic fracture. Bone marrow signal intensity within normal limits. No discrete or worrisome osseous lesions. Reactive endplate changes present about the L1-2 and L2-3 interspaces related to scoliotic curvature. Conus medullaris and cauda equina: Conus extends to the L1 level. Conus and cauda equina appear normal. Paraspinal and other soft tissues: Mild edema within the lower left paraspinous soft tissues (series 17, image 18), of uncertain significance, but could reflect muscular strain/injury. Possible myositis could also be considered. Paraspinous soft tissues demonstrate no other acute abnormality. Visualized visceral structures unremarkable. Disc levels: L1-2: Mild diffuse disc bulge. Left far lateral reactive endplate changes with endplate osteophytic spurring. Mild facet hypertrophy. No significant stenosis. L2-3: Mild diffuse disc bulge. Left far lateral reactive endplate changes with endplate osteophytic spurring. Mild to moderate facet hypertrophy. Borderline mild left lateral recess narrowing without significant spinal stenosis. Mild left L2 foraminal narrowing. L3-4: Mild diffuse disc bulge with disc desiccation. Disc bulging slightly asymmetric to the right. Moderate facet and ligament flavum hypertrophy. Resultant mild right lateral recess narrowing without significant spinal  stenosis. Foramina are patent. L4-5: Minimal disc bulge. Right far lateral reactive endplate changes. Moderate facet hypertrophy. Resultant mild to moderate right lateral recess narrowing without spinal stenosis. Mild right L4 foraminal narrowing. L5-S1: Diffuse disc bulge with intervertebral disc space narrowing. Chronic reactive endplate changes on the right. Bilateral facet hypertrophy. No significant stenosis or impingement. IMPRESSION: MRI HEAD IMPRESSION: Normal brain MRI for age. No acute intracranial abnormality identified. MRI CERVICAL SPINE IMPRESSION: 1. No acute abnormality within the cervical spine. 2. Moderate multilevel cervical spondylolysis with resultant mild to moderate diffuse spinal stenosis at C3-4 through C6-7, most pronounced at C5-6. 3. Multifactorial degenerative changes with resultant multilevel foraminal narrowing as above. Notable findings include moderate bilateral C4, C5, and C7 foraminal narrowing, with moderate to severe bilateral C6 foraminal stenosis. MRI LUMBAR SPINE IMPRESSION: 1. No acute abnormality within the lumbar spine. 2. Mild multilevel degenerative disc disease and facet hypertrophy for age without significant spinal stenosis. Mild lateral recess narrowing on the left at L2-3 and on the right at L3-4 and L4-5. 3. Pronounced thoracolumbar dextroscoliosis. Electronically Signed   By: Jeannine Boga M.D.   On: 03/09/2018 20:39   US Abdomen Complete  Result Date: 03/09/2018 CLINICAL DATA:  Abdominal pain for the past 2 days. History of colonic malignancy with resection. EXAM: ABDOMEN ULTRASOUND COMPLETE COMPARISON:  No recent studies in PACs FINDINGS: Gallbladder: No gallstones or wall thickening visualized. No sonographic Murphy sign noted by sonographer. Common bile duct: Diameter: 3.5 mm Liver: No focal lesion identified. Within normal limits in parenchymal echogenicity. Portal vein is patent on color Doppler imaging with normal direction of blood flow towards  the liver. IVC: No abnormality visualized. Pancreas: Visualized portion unremarkable. Spleen: Size and appearance within normal limits. Right Kidney: Length: 10.3 cm. Echogenicity within normal limits. No mass or hydronephrosis visualized. Left Kidney: Length: 10.0 cm. Echogenicity within normal limits. No mass or hydronephrosis visualized. Abdominal  aorta: No aneurysm visualized. Other findings: There is no ascites. IMPRESSION: No gallstones or sonographic evidence of acute cholecystitis. No acute intra-abdominal abnormality is observed. Electronically Signed   By: David  Martinique M.D.   On: 03/09/2018 10:55    Micro Results   Recent Results (from the past 240 hour(s))  Culture, Urine     Status: Abnormal   Collection Time: 03/09/18  4:40 AM  Result Value Ref Range Status   Specimen Description URINE, RANDOM  Final   Special Requests   Final    NONE Performed at [facility], [address], [place], [place] [address]    Culture 60,000 COLONIES/mL ENTEROCOCCUS FAECALIS (A)  Final   Report Status 03/11/2018 FINAL  Final   Organism ID, Bacteria ENTEROCOCCUS FAECALIS (A)  Final      Susceptibility   Enterococcus faecalis - MIC*    AMPICILLIN <=2 SENSITIVE Sensitive     LEVOFLOXACIN 1 SENSITIVE Sensitive     NITROFURANTOIN <=16 SENSITIVE Sensitive     VANCOMYCIN 1 SENSITIVE Sensitive     * 60,000 COLONIES/mL ENTEROCOCCUS FAECALIS       Today   Subjective:   Gladies Sofranko today reports she is feeling much better, no chest pain, no shortness of breath, reports her generalized weakness is improving .  Objective:   Blood pressure (!) 116/41, pulse 77, temperature 98.2 F (36.8 C), temperature source Oral, resp. rate 16, height 5\' 6"  (1.676 m), weight 60.8 kg (134 lb 0.6 oz), SpO2 97 %.   Intake/Output Summary (Last 24 hours) at 03/13/2018 1245 Last data filed at 03/13/2018 1239 Gross per 24 hour  Intake 1688.06 ml  Output 0 ml  Net 1688.06 ml    Exam Awake Alert, Oriented x 3,  No new F.N deficits, Normal affect Symmetrical Chest wall movement, Good air movement bilaterally, CTAB RRR,No Gallops,Rubs or new Murmurs, No Parasternal Heave +ve B.Sounds, Abd Soft, Non tender, No rebound -guarding or rigidity. No Cyanosis, Clubbing or edema, No new Rash or bruise  Data Review   CBC w Diff:  Lab Results  Component Value Date   WBC 13.7 (H) 03/13/2018   HGB 10.5 (L) 03/13/2018   HCT 31.0 (L) 03/13/2018   PLT 244 03/13/2018   LYMPHOPCT 22 03/09/2018   MONOPCT 11 03/09/2018   EOSPCT 1 03/09/2018   BASOPCT 0 03/09/2018    CMP:  Lab Results  Component Value Date   NA 132 (L) 03/13/2018   K 4.1 03/13/2018   CL 95 (L) 03/13/2018   CO2 28 03/13/2018   BUN 21 03/13/2018   CREATININE 1.09 (H) 03/13/2018   PROT 5.6 (L) 03/09/2018   ALBUMIN 3.2 (L) 03/09/2018   BILITOT 1.2 03/09/2018   ALKPHOS 54 03/09/2018   AST 29 03/09/2018   ALT 19 03/09/2018  .   Total Time in preparing paper work, data evaluation and todays exam - 60 minutes  Phillips Climes M.D on 03/13/2018 at 12:45 PM  [facility]   Office  [phone]

## 2018-03-13 NOTE — Care Management Important Message (Signed)
Important Message  Patient Details  Name: Regina [name] MRN: [id number] Date of Birth: 03/04/1933   Medicare Important Message Given:  Yes    Trinity Haun P Donnalynn Wheeless 03/13/2018, 3:09 PM

## 2018-03-13 NOTE — Evaluation (Signed)
Occupational Therapy Evaluation and Discharge Patient Details Name: Regina [name] MRN: [id number] DOB: 02/10/1933 Today's Date: 03/13/2018    History of Present Illness 84 yo admitted with generalized weakness and UTI. PMhx: HTN, CHF, colon CA, hypothyroidism, LBP   Clinical Impression   This 84 yo female admitted with above presents to acute OT with decreased balance, decreased mobility, and slowness to move all affecting her safety and independence with basic ADLs. She will benefit from continued OT at SNF to work back towards her PLOF. Defer remainder of OT to SNF, we will D/C from acute OT.     Follow Up Recommendations  SNF;Supervision/Assistance - 24 hour    Equipment Recommendations  None recommended by OT       Precautions / Restrictions Precautions Precautions: Fall Restrictions Weight Bearing Restrictions: No      Mobility Bed Mobility Overal bed mobility: Needs Assistance Bed Mobility: Rolling;Sidelying to Sit Rolling: Min assist Sidelying to sit: Mod assist       General bed mobility comments: Able to get legs 3/4 way off of bed, A to bring hips around, able to bring trunk up and scoot forward (HOB up and use of rail)  Transfers Overall transfer level: Needs assistance Equipment used: Rolling walker (2 wheeled) Transfers: Sit to/from [facility] Sit to Stand: Min assist Stand pivot transfers: Min assist       General transfer comment: Increased time and cues for hand placement    Balance Overall balance assessment: Needs assistance Sitting-balance support: No upper extremity supported;Feet supported Sitting balance-Leahy Scale: Fair     Standing balance support: No upper extremity supported;During functional activity Standing balance-Leahy Scale: Fair Standing balance comment: standing for clothing and toileting hygiene                           ADL either performed or assessed with clinical judgement   ADL Overall ADL's  : Needs assistance/impaired Eating/Feeding: Independent;Sitting   Grooming: Set up;Sitting;Wash/dry hands;Brushing hair;Supervision/safety   Upper Body Bathing: Supervision/ safety;Set up;Sitting   Lower Body Bathing: Moderate assistance Lower Body Bathing Details (indicate cue type and reason): min A sit<>stand with increased time Upper Body Dressing : Minimal assistance;Sitting   Lower Body Dressing: Moderate assistance Lower Body Dressing Details (indicate cue type and reason): min A sit<>stand with increased time Toilet Transfer: Minimal assistance;Stand-pivot;BSC;RW   Toileting- Clothing Manipulation and Hygiene: Moderate assistance Toileting - Clothing Manipulation Details (indicate cue type and reason): min A sit<>stand with increased time; can get Depends down, but needs Mod A to pull them up             Vision Patient Visual Report: No change from baseline              Pertinent Vitals/Pain Pain Assessment: Faces Faces Pain Scale: Hurts little more Pain Location: left hip Pain Descriptors / Indicators: Sore Pain Intervention(s): Monitored during session;Repositioned     Hand Dominance  right   Extremity/Trunk Assessment Upper Extremity Assessment Upper Extremity Assessment: Generalized weakness              Cognition Arousal/Alertness: Awake/alert Behavior During Therapy: Flat affect Overall Cognitive Status: Impaired/Different from baseline Area of Impairment: Following commands;Problem solving                       Following Commands: Follows one step commands with increased time     Problem Solving: Slow processing;Difficulty sequencing;Requires verbal cues General  Comments: Pt consistently asking about if surfaces she was transferring to were stable and/or locked              Home Living Family/patient expects to be discharged to:: Skilled nursing facility Living Arrangements: Spouse/significant other Available Help at  Discharge: Family;Available 24 hours/day                                    Prior Functioning/Environment Level of Independence: Needs assistance  Gait / Transfers Assistance Needed: pt states she has been using RW recently and that spouse assists at times for night time trips to toilet ADL's / Homemaking Assistance Needed: pt reports she normally can perform ADLs but has required assist recently with feeling weak            OT Problem List: Decreased strength;Impaired balance (sitting and/or standing);Decreased cognition;Pain         OT Goals(Current goals can be found in the care plan section) Acute Rehab OT Goals Patient Stated Goal: return home  OT Frequency:                AM-PAC PT "6 Clicks" Daily Activity     Outcome Measure Help from another person eating meals?: None Help from another person taking care of personal grooming?: A Little Help from another person toileting, which includes using toliet, bedpan, or urinal?: A Lot Help from another person bathing (including washing, rinsing, drying)?: A Lot Help from another person to put on and taking off regular upper body clothing?: A Little Help from another person to put on and taking off regular lower body clothing?: A Lot 6 Click Score: 16   End of Session Equipment Utilized During Treatment: Rolling walker  Activity Tolerance: Patient tolerated treatment well Patient left: in chair;with call bell/phone within reach;with chair alarm set  OT Visit Diagnosis: Unsteadiness on feet (R26.81);Other abnormalities of gait and mobility (R26.89);Muscle weakness (generalized) (M62.81)                Time: 9532-0233 OT Time Calculation (min): 44 min Charges:  OT General Charges $OT Visit: 1 Visit OT Evaluation $OT Eval Moderate Complexity: 1 Mod OT Treatments $Self Care/Home Management : 23-37 mins  [address], OTR/L [phone] 03/13/2018

## 2018-03-13 NOTE — Clinical Social Work Note (Signed)
CSW facilitated patient discharge including contacting patient family and facility to confirm patient discharge plans. Clinical information faxed to facility and family agreeable with plan. Patient's family will transport by car to [facility]. RN to call report prior to discharge [phone] Room 201).  CSW will sign off for now as social work intervention is no longer needed. Please consult [place] again if new needs arise.  Dayton Scrape, [place]

## 2018-03-18 ENCOUNTER — Ambulatory Visit (INDEPENDENT_AMBULATORY_CARE_PROVIDER_SITE_OTHER): Payer: Medicare Other | Admitting: Neurology

## 2018-03-18 ENCOUNTER — Encounter: Payer: Self-pay | Admitting: Neurology

## 2018-03-18 ENCOUNTER — Telehealth: Payer: Self-pay | Admitting: Neurology

## 2018-03-18 VITALS — BP 151/58 | Ht 66.0 in | Wt 135.0 lb

## 2018-03-18 DIAGNOSIS — R410 Disorientation, unspecified: Secondary | ICD-10-CM

## 2018-03-18 NOTE — Progress Notes (Signed)
Reason for visit: Delirium state  Referring physician: [facility]  Regina [name] is a 84 y.o. female  History of present illness:  Regina [name] is an 84 year old right-handed white female with a history of an admission to the hospital on 08 March 2018.  The patient has come in today with her husband and her daughter.  The patient currently is residing in [facility].  The patient has no pre-existing history of dementia or any slight memory problems whatsoever.  She was operating a motor vehicle and managing her medications and appointments well prior to admission.  Within about a week before she was admitted to the hospital she began to become somewhat confused and agitated.  The patient went into the hospital with a delirium state, she was hallucinating and agitated and not sleeping.  The patient was found to have a mild urinary tract infection, possibly some cellulitis.  Her sodium level was 128.  The patient has been discharged from the hospital but the delirium state continues.  The patient has required Haldol to help reduce agitation.  She will some days eat well and other days not.  She does have some arthritis pain, with bilateral hip pain.  She has had some problems with gait instability before admission, she was using a walker on occasion.  Walking has worsened slightly.  She has no focal deficits.  CT scan of the brain done during the hospitalization was unremarkable.  The patient has had a normal B12 level in the hospital.  She is sent to this office for further evaluation of the ongoing confusion.  Past Medical History:  Diagnosis Date  . Allergy   . Barrett esophagus 2010  . Cancer ([place])   . GERD (gastroesophageal reflux disease)   . Hypertension   . Thyroid disease     Past Surgical History:  Procedure Laterality Date  . APPENDECTOMY    . COLON SURGERY    . COLONOSCOPY    . COLONOSCOPY N/A 08/03/2014   Procedure: COLONOSCOPY;  Surgeon: Inda Castle, MD;  Location:  [place];  Service: Endoscopy;  Laterality: N/A;  . POLYPECTOMY    . TONSILLECTOMY    . TOTAL THYROIDECTOMY    . TUBAL LIGATION      Family History  Problem Relation Age of Onset  . Heart disease Mother   . Heart disease Father   . Diabetes Sister   . COPD Brother   . Diabetes Brother     Social history:  reports that she has never smoked. She has never used smokeless tobacco. She reports that she does not drink alcohol or use drugs.  Medications:  Prior to Admission medications   Medication Sig Start Date End Date Taking? Authorizing Provider  acetaminophen (TYLENOL) 325 MG tablet Take 2 tablets (650 mg total) by mouth every 6 (six) hours as needed for mild pain (or Fever >/= 101). 03/13/18   Elgergawy, Silver Huguenin, MD  calcitRIOL (ROCALTROL) 0.25 MCG capsule Take 0.25 mcg by mouth daily.    [provider]  carvedilol (COREG) 3.125 MG tablet Take 1 tablet (3.125 mg total) by mouth 2 (two) times daily. 03/13/18 03/13/19  Elgergawy, Silver Huguenin, MD  esomeprazole (NEXIUM) 40 MG capsule Take 40 mg by mouth daily as needed (heartburn).     [provider]  feeding supplement, ENSURE ENLIVE, (ENSURE ENLIVE) LIQD Take 237 mLs by mouth 2 (two) times daily between meals. 03/13/18   Elgergawy, Silver Huguenin, MD  furosemide (LASIX)  40 MG tablet Take 1 tablet (40 mg total) by mouth daily. Please start Monday, 03/16/2018 03/16/18   Elgergawy, Silver Huguenin, MD  furosemide (LASIX) 40 MG tablet Please take 40 mg QPM  as needed for volume overload or leg edema 03/13/18   Elgergawy, Silver Huguenin, MD  levothyroxine (SYNTHROID, LEVOTHROID) 88 MCG tablet Take 88 mcg by mouth daily before breakfast.    [provider]  Multiple Vitamin (MULTIVITAMIN WITH MINERALS) TABS tablet Take 1 tablet by mouth daily. 03/14/18   Elgergawy, Silver Huguenin, MD  Polyvinyl Alcohol-Povidone (REFRESH OP) Apply 1 drop to eye daily as needed (dry eyes).    [provider]      Allergies  Allergen Reactions  . Penicillins  Hives and Itching    ROS:  Out of a complete 14 system review of symptoms, the patient complains only of the following symptoms, and all other reviewed systems are negative.  Confusion, weakness, tremor Anxiety, not enough sleep, change in appetite, racing thoughts Insomnia  Blood pressure (!) 151/58, height 5\' 6"  (1.676 m), weight 135 lb (61.2 kg).  Physical Exam  General: The patient is alert and cooperative at the time of the examination.  Affect is flat.  Eyes: Pupils are equal, round, and reactive to light. Discs are flat bilaterally.  Neck: The neck is supple, no carotid bruits are noted.  Respiratory: The respiratory examination is clear.  Cardiovascular: The cardiovascular examination reveals a regular rate and rhythm, no obvious murmurs or rubs are noted.  Skin: Extremities are with 2+ edema below the knees bilaterally.  Neurologic Exam  Mental status: The patient is alert and oriented x 2 at the time of the examination (not oriented to date). The Mini-Mental status examination done today shows a total score of 12/30.  Cranial nerves: Facial symmetry is present. There is good sensation of the face to pinprick and soft touch bilaterally. The strength of the facial muscles and the muscles to head turning and shoulder shrug are normal bilaterally. Speech is well enunciated, no aphasia or dysarthria is noted. Extraocular movements are full. Visual fields are full. The tongue is midline, and the patient has symmetric elevation of the soft palate. No obvious hearing deficits are noted.  Motor: The motor testing reveals 5 over 5 strength of all 4 extremities. Good symmetric motor tone is noted throughout.  Sensory: Sensory testing is intact to pinprick, soft touch, vibration sensation, and position sense on all 4 extremities.  Coordination: Cerebellar testing reveals good finger-nose-finger and heel-to-shin bilaterally.  Gait and station: Gait is slightly wide-based,  unsteady, the patient has a tendency to lean backwards.  The patient requires assistance with standing.  Tandem gait was not attempted.  Romberg is negative.  Reflexes: Deep tendon reflexes are symmetric and normal bilaterally. Toes are downgoing bilaterally.   CT head 03/12/18:  IMPRESSION: Atrophy with small vessel chronic ischemic changes of deep cerebral white matter.  No definite acute intracranial abnormalities.  * CT scan images were reviewed online. I agree with the written report.    Assessment/Plan:  1.  Delirium state  The patient had a slight urinary tract infection, mild renal insufficiency, possible cellulitis, and hyponatremia on the recent admission.  There is no pre-existing history of dementia or any memory problems as per the family.  The patient is still in the delirium state, this has not resolved.  At times, a delirium state may lead to permanent mental status changes.  The Aricept was recently added, I would discontinue this.  The patient will have blood work today, she will have an EEG study, MRI of the brain will be done.  She will follow-up in 2 months.  Until the patient begins to sleep well at night, the delirium state will likely not resolve.  Jill Alexanders MD 03/18/2018 9:56 AM  [facility] [address] [place], [place] [address]  Phone [phone] Fax [phone]

## 2018-03-18 NOTE — Patient Instructions (Signed)
Stop the aricept.  We will get MRI of the brain and get an EEG study.

## 2018-03-18 NOTE — Telephone Encounter (Signed)
Medicare/aarp order sent to [facility]. No auth they will reach out to the pt to schedule.  °

## 2018-03-19 ENCOUNTER — Telehealth: Payer: Self-pay | Admitting: *Deleted

## 2018-03-19 LAB — COMPREHENSIVE METABOLIC PANEL
A/G RATIO: 1.4 (ref 1.2–2.2)
ALBUMIN: 3.4 g/dL — AB (ref 3.5–4.7)
ALT: 20 IU/L (ref 0–32)
AST: 23 IU/L (ref 0–40)
Alkaline Phosphatase: 60 IU/L (ref 39–117)
BUN / CREAT RATIO: 18 (ref 12–28)
BUN: 20 mg/dL (ref 8–27)
Bilirubin Total: 0.3 mg/dL (ref 0.0–1.2)
CALCIUM: 9.4 mg/dL (ref 8.7–10.3)
CO2: 25 mmol/L (ref 20–29)
CREATININE: 1.1 mg/dL — AB (ref 0.57–1.00)
Chloride: 97 mmol/L (ref 96–106)
GFR, EST AFRICAN AMERICAN: 54 mL/min/{1.73_m2} — AB (ref >59)
GFR, EST NON AFRICAN AMERICAN: 47 mL/min/{1.73_m2} — AB (ref >59)
Globulin, Total: 2.4 g/dL (ref 1.5–4.5)
Glucose: 166 mg/dL — ABNORMAL HIGH (ref 65–99)
Potassium: 4.1 mmol/L (ref 3.5–5.2)
Sodium: 137 mmol/L (ref 134–144)
Total Protein: 5.8 g/dL — ABNORMAL LOW (ref 6.0–8.5)

## 2018-03-19 LAB — ANA W/REFLEX: Anti Nuclear Antibody(ANA): NEGATIVE

## 2018-03-19 NOTE — Telephone Encounter (Signed)
-----   Message from Kathrynn Ducking, MD sent at 03/19/2018  3:07 PM EDT ----- Blood work shows chronic stable renal insufficiency, minimally low total protein and albumin levels.  ANA is negative. Please call the patient. ----- Message ----- From: Lavone Neri Lab Results In Sent: 03/19/2018   7:39 AM EDT To: Kathrynn Ducking, MD

## 2018-03-19 NOTE — Telephone Encounter (Signed)
Called and spoke with daughter (on [place]) about lab results per Dr. Jannifer Franklin note. She verbalized understanding. She asked about getting MRI scheduled. Advised appears order sent to [facility]. She will call them to get MRI scheduled.

## 2018-03-23 ENCOUNTER — Ambulatory Visit (INDEPENDENT_AMBULATORY_CARE_PROVIDER_SITE_OTHER): Payer: No Typology Code available for payment source | Admitting: Neurology

## 2018-03-23 DIAGNOSIS — R410 Disorientation, unspecified: Secondary | ICD-10-CM | POA: Diagnosis not present

## 2018-03-24 ENCOUNTER — Telehealth: Payer: Self-pay | Admitting: Neurology

## 2018-03-24 NOTE — Telephone Encounter (Signed)
I called the family.  The EEG study was normal.  The patient is eating better, she appears to be sleeping.  We will watch her over time, hopefully the delirium will resolve soon.

## 2018-03-24 NOTE — Procedures (Signed)
    History:  Regina [name] is an 84 year old patient with a history of onset of confusion in late July 2019.  The patient is felt to be in a delirium state, she has had agitation, hallucinations, insomnia.  The patient is being evaluated for this issue.  This is a routine EEG.  No skull defects are noted.  Medications include Calcitrol, Nexium, Lasix, Synthroid, multivitamins, and Tylenol.  EEG classification: Normal awake  Description of the recording: The background rhythms of this recording consists of a fairly well modulated medium amplitude alpha rhythm of 8 Hz that is reactive to eye opening and closure. As the record progresses, the patient appears to remain in the waking state throughout the recording. Photic stimulation was performed, resulting in a bilateral and symmetric photic driving response. Hyperventilation was not performed. At no time during the recording does there appear to be evidence of spike or spike wave discharges or evidence of focal slowing.  Electrode artifact was evident at the T3 and T4 electrodes during the recording.  EKG monitor shows no evidence of cardiac rhythm abnormalities with a heart rate of 60.  Impression: This is a normal EEG recording in the waking state. No evidence of ictal or interictal discharges are seen.

## 2018-04-02 ENCOUNTER — Ambulatory Visit
Admission: RE | Admit: 2018-04-02 | Discharge: 2018-04-02 | Disposition: A | Discharge status: Home / Self Care | Payer: Medicare Other | Source: Ambulatory Visit | Attending: Neurology | Admitting: Neurology

## 2018-04-02 DIAGNOSIS — R410 Disorientation, unspecified: Secondary | ICD-10-CM | POA: Diagnosis not present

## 2018-04-03 ENCOUNTER — Telehealth: Payer: Self-pay | Admitting: Neurology

## 2018-04-03 NOTE — Telephone Encounter (Signed)
  I called the family.  No change with MRI of the brain, the patient has mild white matter changes, nothing that would be potentiating ongoing confusion.  The patient is back home from the nursing home, she still remains agitated and confused.  They will make sure that she is getting plenty of rest, getting adequate sleep is important.  They will call if any new issues arise.  Hopefully the delirium state will not leave her with permanent cognitive changes.  MRI bran 04/02/18:  IMPRESSION:   MRI brain (without) demonstrating: - Mild periventricular and subcortical non-specific gliosis. - No acute findings. - No change from MRI on 03/09/18.

## 2018-04-20 ENCOUNTER — Telehealth: Payer: Self-pay | Admitting: Neurology

## 2018-04-20 NOTE — Telephone Encounter (Signed)
Dr. Jannifer Franklin- would you like to work her in for sooner appt?  I also received office note via fax from Murvin Donning, MD. I gave this to you to review.

## 2018-04-20 NOTE — Telephone Encounter (Signed)
Patient's husband Jeneen Rinks (on [place]) states patient has become more confused. She has an appointment with Dr. Jannifer Franklin on 05-26-18 but thinks she needs to be seen sooner.

## 2018-04-20 NOTE — Telephone Encounter (Signed)
Called husband, Jeneen Rinks and scheduled work in visit for 04/28/18 at 8am. He verbalized understanding. Advised I will call if any cx for later in the day.

## 2018-04-20 NOTE — Telephone Encounter (Signed)
I called the husband.  The patient is gotten more confused over the last 2 weeks.  She is agitated, hallucinating.  She has not been normal with her mental status since late July 2019.  The patient was felt to have a delirium state but this has never resolved.  I will get the patient worked in for revisit.  We may need to do further blood testing, and consider lumbar puncture.

## 2018-04-21 NOTE — Telephone Encounter (Signed)
Called husband back. Offered tomorrow at 330pm, check in 300pm instead. Dr. Jannifer Franklin had a cx. Husband agreed to date/time and stated this would work better for them. I scheduled pt and cx appt previously made for 04/28/18.

## 2018-04-22 ENCOUNTER — Ambulatory Visit (INDEPENDENT_AMBULATORY_CARE_PROVIDER_SITE_OTHER): Payer: Medicare Other | Admitting: Neurology

## 2018-04-22 ENCOUNTER — Encounter: Payer: Self-pay | Admitting: Neurology

## 2018-04-22 VITALS — BP 179/70 | HR 76 | Ht 66.0 in | Wt 129.0 lb

## 2018-04-22 DIAGNOSIS — R410 Disorientation, unspecified: Secondary | ICD-10-CM

## 2018-04-22 NOTE — Progress Notes (Signed)
Reason for visit: Altered mental status  Regina [name] is an 84 y.o. female  History of present illness:  Regina [name] is an 84 year old right-handed white female with a history of what was felt to be a delirium state that began in the latter part of July 2019.  About 2 weeks prior, she has had several injections in the back.  The husband claims that there was a sudden worsening of mental status from one day to the next, the patient suddenly became confused and somewhat agitated.  The patient has had some improvement following the hospitalization, but then over the last 2 weeks she has worsened some with her mental status.  She is being followed through physical, occupational, and speech therapy in the home environment.  The patient herself claims that she feels more confused towards the latter part of the day, she feels nervous and jittery.  At times she may not be able to sleep well.  She does have occasional hallucinations.  She is eating and drinking fairly well.  She has not had any falls, she does have some mild gait instability.  She reports no headaches or numbness or weakness of the extremities.  She returns for an evaluation.  Past Medical History:  Diagnosis Date  . Allergy   . Barrett esophagus 2010  . Cancer ([place])   . GERD (gastroesophageal reflux disease)   . Hypertension   . Thyroid disease     Past Surgical History:  Procedure Laterality Date  . APPENDECTOMY    . COLON SURGERY    . COLONOSCOPY    . COLONOSCOPY N/A 08/03/2014   Procedure: COLONOSCOPY;  Surgeon: Inda Castle, MD;  Location: [facility];  Service: Endoscopy;  Laterality: N/A;  . POLYPECTOMY    . TONSILLECTOMY    . TOTAL THYROIDECTOMY    . TUBAL LIGATION      Family History  Problem Relation Age of Onset  . Heart disease Mother   . Heart disease Father   . Diabetes Sister   . COPD Brother   . Diabetes Brother     Social history:  reports that she has never smoked. She has never used smokeless  tobacco. She reports that she does not drink alcohol or use drugs.    Allergies  Allergen Reactions  . Penicillins Hives and Itching    Medications:  Prior to Admission medications   Medication Sig Start Date End Date Taking? Authorizing Provider  calcitRIOL (ROCALTROL) 0.25 MCG capsule Take 0.25 mcg by mouth daily.   Yes [provider]  Carboxymethylcellulose Sod PF (REFRESH CELLUVISC) 1 % GEL Apply 1 drop to eye as needed.   Yes [provider]  carvedilol (COREG) 3.125 MG tablet Take 1 tablet (3.125 mg total) by mouth 2 (two) times daily. 03/13/18 03/13/19 Yes Elgergawy, Silver Huguenin, MD  esomeprazole (NEXIUM) 40 MG capsule Take 40 mg by mouth daily as needed (heartburn).    Yes [provider]  furosemide (LASIX) 40 MG tablet Take 1 tablet (40 mg total) by mouth daily. Please start Monday, 03/16/2018 03/16/18  Yes Elgergawy, Silver Huguenin, MD  furosemide (LASIX) 40 MG tablet Please take 40 mg QPM  as needed for volume overload or leg edema 03/13/18  Yes Elgergawy, Silver Huguenin, MD  levothyroxine (SYNTHROID, LEVOTHROID) 88 MCG tablet Take 88 mcg by mouth daily before breakfast.   Yes [provider]  Multiple Vitamin (MULTIVITAMIN WITH MINERALS) TABS tablet Take 1 tablet by mouth daily. 03/14/18  Yes  Elgergawy, Silver Huguenin, MD  Polyvinyl Alcohol-Povidone (REFRESH OP) Apply 1 drop to eye daily as needed (dry eyes).   Yes [provider]    ROS:  Out of a complete 14 system review of symptoms, the patient complains only of the following symptoms, and all other reviewed systems are negative.  Constipation Frequency of urination Memory loss Agitation, behavior problem, confusion, decreased concentration, depression, anxiety  Blood pressure (!) 179/70, pulse 76, height 5\' 6"  (1.676 m), weight 129 lb (58.5 kg), SpO2 96 %.  Physical Exam  General: The patient is alert and cooperative at the time of the examination.  The patient has a somewhat flat affect.  Skin: No  significant peripheral edema is noted.   Neurologic Exam  Mental status: The patient is alert and oriented x 3 at the time of the examination. The Mini-Mental status examination done today shows a total score 26/30.   Cranial nerves: Facial symmetry is present. Speech is normal, no aphasia or dysarthria is noted. Extraocular movements are full. Visual fields are full.  Motor: The patient has good strength in all 4 extremities.  Sensory examination: Soft touch sensation is symmetric on the face, arms, and legs.  Coordination: The patient has good finger-nose-finger and heel-to-shin bilaterally.  Gait and station: The patient has a slightly wide-based, unsteady gait.  Romberg is negative.  Tandem gait was not tested.  Reflexes: Deep tendon reflexes are symmetric.   Assessment/Plan:  1.  Altered mental status, delirium  The patient has not regained her premorbid mental status, she is sleeping better but she is still having some confusion later in the day.  The husband believes that she has worsened over the last 2 weeks.  The patient will undergo further blood work evaluation, she will have a lumbar puncture.  She will follow-up for next scheduled visit in October.  The Mini-Mental status examination done today shows a significant improvement from the initial visit, the score went from 12/30 to 26/30.  Jill Alexanders MD 04/22/2018 3:45 PM  [facility] [address] [place], [place] [address]  Phone [phone] Fax [phone]

## 2018-04-23 LAB — URINALYSIS, ROUTINE W REFLEX MICROSCOPIC
Bilirubin, UA: NEGATIVE
Glucose, UA: NEGATIVE
KETONES UA: NEGATIVE
NITRITE UA: NEGATIVE
Protein, UA: NEGATIVE
RBC UA: NEGATIVE
SPEC GRAV UA: 1.012 (ref 1.005–1.030)
UUROB: 0.2 mg/dL (ref 0.2–1.0)
pH, UA: 6 (ref 5.0–7.5)

## 2018-04-23 LAB — MICROSCOPIC EXAMINATION

## 2018-04-28 ENCOUNTER — Ambulatory Visit: Payer: Self-pay | Admitting: Neurology

## 2018-04-28 ENCOUNTER — Telehealth: Payer: Self-pay | Admitting: *Deleted

## 2018-04-28 LAB — THYROID PEROXIDASE ANTIBODY: THYROID PEROXIDASE ANTIBODY: 8 [IU]/mL (ref 0–34)

## 2018-04-28 LAB — RPR: RPR Ser Ql: NONREACTIVE

## 2018-04-28 LAB — HIV ANTIBODY (ROUTINE TESTING W REFLEX): HIV SCREEN 4TH GENERATION: NONREACTIVE

## 2018-04-28 LAB — AMMONIA: AMMONIA: 32 ug/dL (ref 19–87)

## 2018-04-28 LAB — SEDIMENTATION RATE: SED RATE: 16 mm/h (ref 0–40)

## 2018-04-28 LAB — PARANEOPLASTIC PROFILE 1: Neuronal Nuc Ab (Ri), IFA: <1:10 {titer}

## 2018-04-28 LAB — THYROGLOBULIN ANTIBODY

## 2018-04-28 LAB — B. BURGDORFI ANTIBODIES: Lyme IgG/IgM Ab: <0.91 {ISR} (ref 0.00–0.90)

## 2018-04-28 NOTE — Telephone Encounter (Signed)
Called, LVM for pt about unremarkable labs per CW,MD note. Gave [facility] phone number if she has further questions/concerns.

## 2018-04-28 NOTE — Telephone Encounter (Signed)
-----   Message from Kathrynn Ducking, MD sent at 04/28/2018  1:46 PM EDT -----  The blood work results are unremarkable. Please call the patient.  ----- Message ----- From: Lavone Neri Lab Results In Sent: 04/23/2018   5:40 AM EDT To: Kathrynn Ducking, MD

## 2018-05-07 ENCOUNTER — Other Ambulatory Visit (HOSPITAL_COMMUNITY)
Admission: RE | Admit: 2018-05-07 | Discharge: 2018-05-07 | Disposition: A | Discharge status: Home / Self Care | Payer: Medicare Other | Source: Ambulatory Visit | Attending: Neurology | Admitting: Neurology

## 2018-05-07 ENCOUNTER — Ambulatory Visit
Admission: RE | Admit: 2018-05-07 | Discharge: 2018-05-07 | Disposition: A | Discharge status: Home / Self Care | Payer: Medicare Other | Source: Ambulatory Visit | Attending: Neurology | Admitting: Neurology

## 2018-05-07 VITALS — BP 157/64 | HR 72

## 2018-05-07 DIAGNOSIS — B2799 Infectious mononucleosis, unspecified with other complication: Secondary | ICD-10-CM | POA: Insufficient documentation

## 2018-05-07 DIAGNOSIS — R410 Disorientation, unspecified: Secondary | ICD-10-CM | POA: Diagnosis present

## 2018-05-07 NOTE — Discharge Instructions (Signed)

## 2018-05-10 ENCOUNTER — Telehealth: Payer: Self-pay | Admitting: Neurology

## 2018-05-10 DIAGNOSIS — R410 Disorientation, unspecified: Secondary | ICD-10-CM

## 2018-05-10 NOTE — Telephone Encounter (Signed)
I called the patient, talk with husband.  The spinal fluid is not normal, white blood count is elevated, slight elevation in protein level, normal glucose.  Cytology is pending.  I will add some studies onto the spinal fluid already collected.  We will get a fungal culture, herpes PCR, Lyme antibody panel, angiotensin-converting enzyme level.  The may need a QuantiFERON gold TB test.  Further spinal fluid analysis may be required in the future.

## 2018-05-11 NOTE — Telephone Encounter (Signed)
Dr. Jannifer Franklin- see below. Thank you  I called [facility] at [phone] and spoke with Estill Bamberg. Was able to add on Lyme antibodies, CSF (test code 15564), ACE (test code: [phone]) and HSV (test code: [phone]).   Unable to add on Culture, fungus with smear. Specimen only good for 72hr to run this test. Test code: 7096.

## 2018-05-11 NOTE — Telephone Encounter (Signed)
Noted, we may require further spinal fluid analysis in the future

## 2018-05-13 ENCOUNTER — Other Ambulatory Visit (INDEPENDENT_AMBULATORY_CARE_PROVIDER_SITE_OTHER): Payer: Self-pay

## 2018-05-13 ENCOUNTER — Telehealth: Payer: Self-pay | Admitting: Neurology

## 2018-05-13 DIAGNOSIS — Z0289 Encounter for other administrative examinations: Secondary | ICD-10-CM

## 2018-05-13 DIAGNOSIS — G031 Chronic meningitis: Secondary | ICD-10-CM

## 2018-05-13 DIAGNOSIS — R410 Disorientation, unspecified: Secondary | ICD-10-CM

## 2018-05-13 LAB — BORRELIA SPECIES DNA, FLUID, PCR: BBURGDNAFLU: NOT DETECTED

## 2018-05-13 LAB — PROTEIN, CSF: Total Protein, CSF: 76 mg/dL — ABNORMAL HIGH (ref 15–60)

## 2018-05-13 LAB — CSF CELL COUNT WITH DIFFERENTIAL
BASOPHILS, %: 0 %
Eosinophils, CSF: 6 % — ABNORMAL HIGH
Lymphs, CSF: 49 % (ref 40–80)
Monocyte/Macrophage: 13 % — ABNORMAL LOW (ref 15–45)
RBC Count, CSF: 705 cells/uL — ABNORMAL HIGH (ref 0–10)
Segmented Neutrophils-CSF: 32 % — ABNORMAL HIGH (ref 0–6)
WBC, CSF: 20 cells/uL — ABNORMAL HIGH (ref 0–5)

## 2018-05-13 LAB — TEST AUTHORIZATION 2

## 2018-05-13 LAB — VDRL, CSF: VDRL Quant, CSF: NONREACTIVE

## 2018-05-13 LAB — HERPES SIMPLEX VIRUS, TYPE 1 AND 2 DNA, QUANTITATIVE, REAL-TIME PCR: HSV 1 DNA, QN PCR: <100 copies/mL (ref <100)

## 2018-05-13 LAB — ANGIOTENSIN CONVERTING ENZYME, CSF: ACE, CSF: 10 U/L (ref <=15)

## 2018-05-13 LAB — GLUCOSE, CSF: GLUCOSE CSF: 69 mg/dL (ref 40–80)

## 2018-05-13 NOTE — Telephone Encounter (Signed)
I called the patient, the spinal fluid analysis so far is unremarkable.  Cytology is negative.  Source of the inflammatory response is not clear.  We may require another spinal tap in several weeks.  I will order for the blood work looking for vasculitis.  The patient will come in for blood work.

## 2018-05-15 ENCOUNTER — Telehealth: Payer: Self-pay | Admitting: *Deleted

## 2018-05-15 NOTE — Telephone Encounter (Signed)
Spoke with Dr. Krista Blue- further labs showed no signs of infection. Will give to Dr. Jannifer Franklin to review once he returns. Per Dr. Jannifer Franklin, may repeat spinal tap in the future. I will call pt/family to discuss

## 2018-05-15 NOTE — Telephone Encounter (Signed)
Called and spoke with daughter, Caren Griffins (on [place]). Her mother was with her. Relayed results per Dr. Krista Blue. She verbalized understanding and appreciation for call. Advised Dr. Jannifer Franklin back next week. I will give him report to review and decide on next steps.

## 2018-05-15 NOTE — Telephone Encounter (Signed)
Took call from phone room. Spoke with [facility]. They are going to fax over stat lab results completed on 05/07/18 at 11:54am. Provided fax: [phone].

## 2018-05-15 NOTE — Telephone Encounter (Signed)
Received results via fax. Will have WID review since Dr. Jannifer Franklin out of the office.

## 2018-05-19 ENCOUNTER — Telehealth: Payer: Self-pay | Admitting: Neurology

## 2018-05-19 DIAGNOSIS — R410 Disorientation, unspecified: Secondary | ICD-10-CM

## 2018-05-19 LAB — PARANEOPLASTIC PROFILE 1
Neuronal Nuc Ab (Ri), IFA: <1:10 {titer}
Neuronal Nuclear (Hu) Antibody (IB): <1:10 {titer}
Purkinje Cell (Yo) Autoantobodies- IFA: <1:10 {titer}

## 2018-05-19 LAB — ANGIOTENSIN CONVERTING ENZYME: Angio Convert Enzyme: 26 U/L (ref 14–82)

## 2018-05-19 LAB — SEDIMENTATION RATE: SED RATE: 19 mm/h (ref 0–40)

## 2018-05-19 LAB — PAN-ANCA: ANCA Proteinase 3: <3.5 U/mL (ref 0.0–3.5)

## 2018-05-19 LAB — ROCKY MTN SPOTTED FVR ABS PNL(IGG+IGM)
RMSF IgG: NEGATIVE
RMSF IgM: 0.24 index (ref 0.00–0.89)

## 2018-05-19 LAB — HIV ANTIBODY (ROUTINE TESTING W REFLEX): HIV Screen 4th Generation wRfx: NONREACTIVE

## 2018-05-19 LAB — RHEUMATOID FACTOR: Rhuematoid fact SerPl-aCnc: <10 IU/mL (ref 0.0–13.9)

## 2018-05-19 NOTE — Telephone Encounter (Signed)
I called patient, talk with the husband.  The blood work and spinal fluid analysis so far has been relatively unrevealing.  The fungal cultures are still pending.  I will repeat the lumbar puncture, send further testing for TB and fungal cultures, TB PCR.  If nothing is noted, I may consider second opinion through [facility] or [facility] regarding her condition.

## 2018-05-26 ENCOUNTER — Encounter: Payer: Self-pay | Admitting: Neurology

## 2018-05-26 ENCOUNTER — Ambulatory Visit (INDEPENDENT_AMBULATORY_CARE_PROVIDER_SITE_OTHER): Payer: Medicare Other | Admitting: Neurology

## 2018-05-26 VITALS — BP 141/50 | HR 68 | Ht 66.0 in | Wt 125.5 lb

## 2018-05-26 DIAGNOSIS — R410 Disorientation, unspecified: Secondary | ICD-10-CM

## 2018-05-26 DIAGNOSIS — G031 Chronic meningitis: Secondary | ICD-10-CM | POA: Diagnosis not present

## 2018-05-26 NOTE — Progress Notes (Signed)
Reason for visit: Delirium state, chronic meningitis  Regina [name] is an 84 y.o. female  History of present illness:  Regina [name] is an 84 year old right-handed white female with a history of confusion that began at the end of July 2019.  The patient initially presented with what sounded like a delirium state but she has never completely resolved from this.  Lumbar puncture was done and shows elevation in white blood count and protein.  Cytology was negative, further spinal fluid analysis will be done next week.  The patient has had extensive blood work testing that has not shown an obvious cause of her confusion.  The patient has been stable with her mental status, she will have delusional thinking and occasional hallucinations.  She has not continued to decline with her cognitive processing.  The patient is working with speech therapy for cognitive training, she is having difficulty with processing information, she has difficulty reading a clock.  She does have tremors of the upper extremities, she denies any falls, she does have some constipation issues.  The patient continues to lose weight, she has lost 10 pounds since August 2019.  The patient has significant fatigue issues at times, particularly after a hot shower.  The patient returns to this office for an evaluation.  Past Medical History:  Diagnosis Date  . Allergy   . Barrett esophagus 2010  . Cancer ([place])   . GERD (gastroesophageal reflux disease)   . Hypertension   . Thyroid disease     Past Surgical History:  Procedure Laterality Date  . APPENDECTOMY    . COLON SURGERY    . COLONOSCOPY    . COLONOSCOPY N/A 08/03/2014   Procedure: COLONOSCOPY;  Surgeon: Inda Castle, MD;  Location: [facility];  Service: Endoscopy;  Laterality: N/A;  . POLYPECTOMY    . TONSILLECTOMY    . TOTAL THYROIDECTOMY    . TUBAL LIGATION      Family History  Problem Relation Age of Onset  . Heart disease Mother   . Heart disease Father   .  Diabetes Sister   . COPD Brother   . Diabetes Brother     Social history:  reports that she has never smoked. She has never used smokeless tobacco. She reports that she does not drink alcohol or use drugs.    Allergies  Allergen Reactions  . Penicillins Hives and Itching    Medications:  Prior to Admission medications   Medication Sig Start Date End Date Taking? Authorizing Provider  calcitRIOL (ROCALTROL) 0.25 MCG capsule Take 0.25 mcg by mouth daily.   Yes [provider]  Carboxymethylcellulose Sod PF (REFRESH CELLUVISC) 1 % GEL Apply 1 drop to eye as needed.   Yes [provider]  carvedilol (COREG) 3.125 MG tablet Take 1 tablet (3.125 mg total) by mouth 2 (two) times daily. 03/13/18 03/13/19 Yes Elgergawy, Silver Huguenin, MD  esomeprazole (NEXIUM) 40 MG capsule Take 40 mg by mouth daily as needed (heartburn).    Yes [provider]  furosemide (LASIX) 40 MG tablet Please take 40 mg QPM  as needed for volume overload or leg edema 03/13/18  Yes Elgergawy, Silver Huguenin, MD  L-Methylfolate-Algae (DEPLIN 15 PO) Take 1 tablet by mouth daily.   Yes [provider]  levothyroxine (SYNTHROID, LEVOTHROID) 88 MCG tablet Take 88 mcg by mouth daily before breakfast.   Yes [provider]  Multiple Vitamin (MULTIVITAMIN WITH MINERALS) TABS tablet Take 1 tablet by mouth daily. 03/14/18  Yes Elgergawy, Silver Huguenin, MD  Polyvinyl Alcohol-Povidone (REFRESH OP) Apply 1 drop to eye daily as needed (dry eyes).   Yes [provider]  vortioxetine HBr (TRINTELLIX) 5 MG TABS tablet Take 5 mg by mouth daily.   Yes [provider]    ROS:  Out of a complete 14 system review of symptoms, the patient complains only of the following symptoms, and all other reviewed systems are negative.  Unexpected weight change, weight loss Cold intolerance Constipation Frequency of urination Back pain Dizziness Confusion, depression  Blood pressure (!) 141/50, pulse 68,  height 5\' 6"  (1.676 m), weight 125 lb 8 oz (56.9 kg).  Physical Exam  General: The patient is alert and cooperative at the time of the examination.  Skin: No significant peripheral edema is noted.   Neurologic Exam  Mental status: The patient is alert and oriented x 3 at the time of the examination.  The patient appears to have a flat affect, she will respond appropriately to verbal commands.   Cranial nerves: Facial symmetry is present. Speech is normal, no aphasia or dysarthria is noted. Extraocular movements are full. Visual fields are full.  Motor: The patient has good strength in all 4 extremities.  Sensory examination: Soft touch sensation is symmetric on the face, arms, and legs.  Coordination: The patient has good finger-nose-finger and heel-to-shin bilaterally.  Gait and station: The patient has a normal gait. Tandem gait is slightly unsteady. Romberg is negative. No drift is seen.  Reflexes: Deep tendon reflexes are symmetric and normal, with exception of some decrease in ankle jerk reflexes bilaterally.    Assessment/Plan:  1.  Confusion, cognitive decline  2.  Abnormal spinal fluid analysis, chronic meningitis  The etiology of the laboratory changes of the spinal fluid are not clear.  The patient will undergo further blood work testing today, spinal fluid analysis will be done next week.  The patient may require a second opinion through [facility] or [facility] if the source of the abnormalities are not identified.  She will otherwise follow-up in 3 months.  Jill Alexanders MD 05/26/2018 9:43 AM  [facility] [address] [place], [place] [address]  Phone [phone] Fax [phone]

## 2018-05-29 LAB — QUANTIFERON-TB GOLD PLUS
QUANTIFERON NIL VALUE: 0.03 [IU]/mL
QUANTIFERON TB2 AG VALUE: 0.05 [IU]/mL
QuantiFERON TB1 Ag Value: 0.04 IU/mL
QuantiFERON-TB Gold Plus: NEGATIVE

## 2018-05-29 LAB — PARANEOPLASTIC PROFILE II
Neuronal Nuc Ab (Ri), IFA: <1:10 {titer}
Neuronal Nuclear (Hu) Antibody (IB): <1:10 {titer}

## 2018-05-29 LAB — VGCC ANTIBODY: VGCC Antibody: NEGATIVE

## 2018-05-29 LAB — C-REACTIVE PROTEIN

## 2018-05-29 LAB — SEDIMENTATION RATE: SED RATE: 17 mm/h (ref 0–40)

## 2018-05-29 LAB — GLUTAMIC ACID DECARBOXYLASE AUTO ABS: Glutamic Acid Decarb Ab: <5 U/mL (ref 0.0–5.0)

## 2018-06-01 ENCOUNTER — Ambulatory Visit
Admission: RE | Admit: 2018-06-01 | Discharge: 2018-06-01 | Disposition: A | Discharge status: Home / Self Care | Payer: Medicare Other | Source: Ambulatory Visit | Attending: Neurology | Admitting: Neurology

## 2018-06-01 VITALS — BP 156/71 | HR 59

## 2018-06-01 DIAGNOSIS — R410 Disorientation, unspecified: Secondary | ICD-10-CM

## 2018-06-01 DIAGNOSIS — R413 Other amnesia: Secondary | ICD-10-CM

## 2018-06-01 NOTE — Discharge Instructions (Signed)

## 2018-06-02 ENCOUNTER — Telehealth: Payer: Self-pay | Admitting: Neurology

## 2018-06-02 LAB — TIQ-MISC

## 2018-06-02 NOTE — Telephone Encounter (Signed)
I called the patient I talk with the husband.  The spinal fluid analysis still shows an elevation in the protein level, the inflammatory cells have returned to normal, red blood cells are normal.  TB and fungal cultures are pending.

## 2018-06-06 LAB — ANAEROBIC AND AEROBIC CULTURE
MICRO NUMBER: 91261940
SPECIMEN QUALITY:: ADEQUATE

## 2018-06-16 ENCOUNTER — Telehealth: Payer: Self-pay | Admitting: Neurology

## 2018-06-16 NOTE — Telephone Encounter (Signed)
I called the husband.  So far, the etiology of the change in mental status has never been determined.  The spinal fluid analysis suggests improvement in the inflammatory cells, it is possible that the patient may have had a viral illness of some sort that is now improving, the patient is clinically improving with her mental status.  We will watch her if she is improving, we may consider referral for second opinion at some point in the future if things change.

## 2018-06-30 LAB — CSF CULTURE: RESULT: NO GROWTH

## 2018-06-30 LAB — FUNGUS CULTURE W SMEAR
MICRO NUMBER: 91261933
SMEAR: NONE SEEN
SPECIMEN QUALITY: ADEQUATE

## 2018-06-30 LAB — CSF CELL COUNT WITH DIFFERENTIAL
RBC Count, CSF: 2 cells/uL (ref 0–10)
WBC, CSF: 2 cells/uL (ref 0–5)

## 2018-06-30 LAB — GLUCOSE, CSF: GLUCOSE CSF: 56 mg/dL (ref 40–80)

## 2018-06-30 LAB — PROTEIN, CSF: TOTAL PROTEIN, CSF: 82 mg/dL — AB (ref 15–60)

## 2018-06-30 LAB — CSF CULTURE W GRAM STAIN
MICRO NUMBER:: 91261934
SPECIMEN QUALITY:: ADEQUATE

## 2018-09-01 ENCOUNTER — Ambulatory Visit (INDEPENDENT_AMBULATORY_CARE_PROVIDER_SITE_OTHER): Payer: Medicare Other | Admitting: Neurology

## 2018-09-01 ENCOUNTER — Encounter: Payer: Self-pay | Admitting: Neurology

## 2018-09-01 VITALS — BP 152/60 | HR 63 | Ht 66.0 in | Wt 123.0 lb

## 2018-09-01 DIAGNOSIS — R55 Syncope and collapse: Secondary | ICD-10-CM | POA: Diagnosis not present

## 2018-09-01 DIAGNOSIS — R410 Disorientation, unspecified: Secondary | ICD-10-CM

## 2018-09-01 NOTE — Progress Notes (Signed)
Reason for visit: Encephalopathy, delirium  Regina [name] is an 85 y.o. female  History of present illness:  Regina [name] is an 85 year old right-handed white female with a history of a delirium state associated with an elevation of the white blood count and protein level in the spinal fluid.  The patient had 20 white blood cells with an even mixture of neutrophils, lymphocytes and monocytes.  The patient has had a repeat lumbar puncture done in October 2019, the white blood count has normalized, the protein level still remains elevated at 82, the glucose level was normal.  The patient had negative cultures.  She has gradually improved with her mental status but she has not yet returned back to normal.  She is not driving a motor vehicle.  She is back to doing housework and some cooking.  She denies any significant issues with controlling the bowels or the bladder.  She does have occasional headaches, but not severe.  At times the bottom of her feet feel numb in the evening hours.  The patient had a syncopal event last week when she took a hot shower and then went to the bathroom to have a bowel movement, she fainted at that time.  She has done this on several occasions previously over the last several years, almost always associated with the same scenario with taking a hot shower.  A prior EEG study was normal.  The patient returns to this office for an evaluation.  The patient was recently placed on antibiotics for a urinary tract infection.  Past Medical History:  Diagnosis Date  . Allergy   . Barrett esophagus 2010  . Cancer ([place])   . GERD (gastroesophageal reflux disease)   . Hypertension   . Thyroid disease     Past Surgical History:  Procedure Laterality Date  . APPENDECTOMY    . COLON SURGERY    . COLONOSCOPY    . COLONOSCOPY N/A 08/03/2014   Procedure: COLONOSCOPY;  Surgeon: Inda Castle, MD;  Location: [place];  Service: Endoscopy;  Laterality: N/A;  . POLYPECTOMY    .  TONSILLECTOMY    . TOTAL THYROIDECTOMY    . TUBAL LIGATION      Family History  Problem Relation Age of Onset  . Heart disease Mother   . Heart disease Father   . Diabetes Sister   . COPD Brother   . Diabetes Brother     Social history:  reports that she has never smoked. She has never used smokeless tobacco. She reports that she does not drink alcohol or use drugs.    Allergies  Allergen Reactions  . Penicillins Hives and Itching    Medications:  Prior to Admission medications   Medication Sig Start Date End Date Taking? Authorizing Provider  calcitRIOL (ROCALTROL) 0.25 MCG capsule Take 0.25 mcg by mouth daily.   Yes [provider]  Carboxymethylcellulose Sod PF (REFRESH CELLUVISC) 1 % GEL Apply 1 drop to eye as needed.   Yes [provider]  carvedilol (COREG) 3.125 MG tablet Take 1 tablet (3.125 mg total) by mouth 2 (two) times daily. 03/13/18 03/13/19 Yes Elgergawy, Silver Huguenin, MD  furosemide (LASIX) 40 MG tablet Please take 40 mg QPM  as needed for volume overload or leg edema 03/13/18  Yes Elgergawy, Silver Huguenin, MD  L-Methylfolate-Algae (DEPLIN 15 PO) Take 1 tablet by mouth daily.   Yes [provider]  levothyroxine (SYNTHROID, LEVOTHROID) 88 MCG tablet Take 88 mcg by mouth daily  before breakfast.   Yes [provider]  Multiple Vitamin (MULTIVITAMIN WITH MINERALS) TABS tablet Take 1 tablet by mouth daily. 03/14/18  Yes Elgergawy, Silver Huguenin, MD  Polyvinyl Alcohol-Povidone (REFRESH OP) Apply 1 drop to eye daily as needed (dry eyes).   Yes [provider]    ROS:  Out of a complete 14 system review of symptoms, the patient complains only of the following symptoms, and all other reviewed systems are negative.  Hearing loss Loss of vision Leg swelling Incontinence of the bladder, frequency of urination Joint pain, back pain, muscle cramps, neck pain Bruising easily, anemia Numbness, weakness, passing out Confusion  Blood pressure (!)  152/60, pulse 63, height 5\' 6"  (1.676 m), weight 123 lb (55.8 kg).  Physical Exam  General: The patient is alert and cooperative at the time of the examination.  Skin: No significant peripheral edema is noted.   Neurologic Exam  Mental status: The patient is alert and oriented x 3 at the time of the examination. The Mini-Mental status examination done today shows a total score of 28/30.   Cranial nerves: Facial symmetry is present. Speech is normal, no aphasia or dysarthria is noted. Extraocular movements are full. Visual fields are full.  Motor: The patient has good strength in all 4 extremities.  Sensory examination: Soft touch sensation is symmetric on the face, arms, and legs.  Coordination: The patient has good finger-nose-finger and heel-to-shin bilaterally.  Gait and station: The patient has a normal gait. Tandem gait was not tested.  Romberg is negative. No drift is seen.  Reflexes: Deep tendon reflexes are symmetric.   Assessment/Plan:  1.  Encephalopathy, resolving  2.  Recurrent syncope  The patient may have had a viral illness resulting in an encephalitis.  Her spinal fluid analysis has improved over time, her mental status is gradually improving.  She has not yet gotten back to her usual baseline but she is much better.  The patient will be followed conservatively, she will come back in about 6 months.  The recent syncopal event likely was related to a vasovagal syncope while having a bowel movement following a very hot shower. A prior EEG study was normal.  Jill Alexanders MD 09/01/2018 11:30 AM  [facility] [address] [place], [place] [address]  Phone [phone] Fax [phone]

## 2018-11-08 IMAGING — XA DG FLUORO GUIDE LUMBAR PUNCTURE
1 series · 1 of 1 positions shown · non-contrast
Comparison: none

CLINICAL DATA: Altered mental status

[Series 3: ortho standard · 1 of 1 slices shown]
[im 1/1]
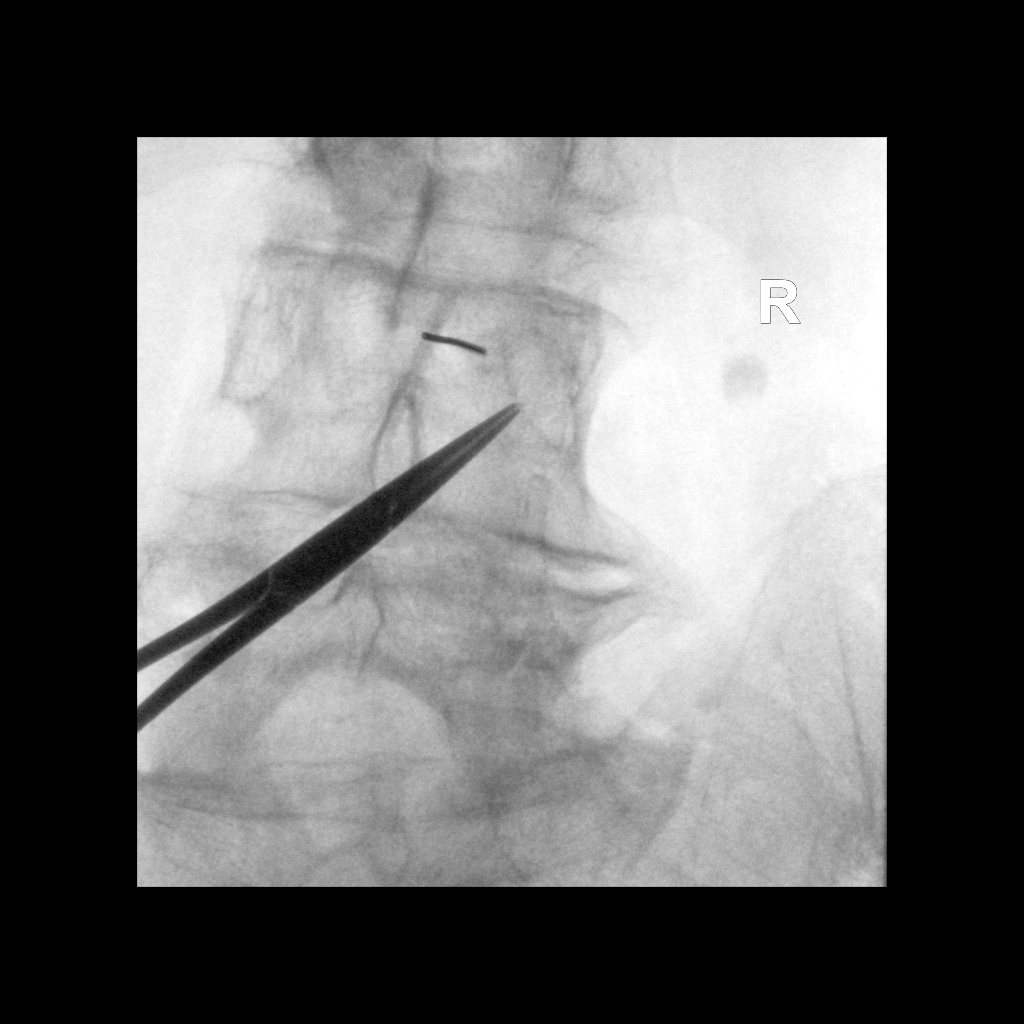

[1 of 1 positions shown; findings below may reference images not displayed]

EXAM:
DIAGNOSTIC LUMBAR PUNCTURE UNDER FLUOROSCOPIC GUIDANCE

FLUOROSCOPY TIME:  49 seconds

PROCEDURE:
Informed consent was obtained from the patient prior to the
procedure, including potential complications of headache, allergy,
and pain. With the patient prone, the lower back was prepped with
Betadine. 1% Lidocaine was used for local anesthesia. Lumbar
puncture was performed at the right L3-4 level using a 20 gauge
needle with return of pink CSF with an opening pressure of 12 cm
water. Sixml of CSF were obtained for laboratory studies. The
patient tolerated the procedure well.

COMPLICATIONS:
None
IMPRESSION: Successful lumbar puncture.

## 2018-12-01 ENCOUNTER — Other Ambulatory Visit: Payer: Self-pay | Admitting: Obstetrics & Gynecology

## 2018-12-01 ENCOUNTER — Other Ambulatory Visit: Payer: Self-pay | Admitting: Internal Medicine

## 2018-12-01 DIAGNOSIS — Z1231 Encounter for screening mammogram for malignant neoplasm of breast: Secondary | ICD-10-CM

## 2019-01-22 ENCOUNTER — Encounter (INDEPENDENT_AMBULATORY_CARE_PROVIDER_SITE_OTHER): Payer: Medicare Other | Admitting: Ophthalmology

## 2019-01-27 ENCOUNTER — Ambulatory Visit
Admission: RE | Admit: 2019-01-27 | Discharge: 2019-01-27 | Disposition: A | Discharge status: Home / Self Care | Payer: Medicare Other | Source: Ambulatory Visit | Attending: Obstetrics & Gynecology | Admitting: Obstetrics & Gynecology

## 2019-01-27 ENCOUNTER — Other Ambulatory Visit: Payer: Self-pay

## 2019-01-27 DIAGNOSIS — Z1231 Encounter for screening mammogram for malignant neoplasm of breast: Secondary | ICD-10-CM

## 2019-01-29 ENCOUNTER — Encounter (INDEPENDENT_AMBULATORY_CARE_PROVIDER_SITE_OTHER): Payer: Medicare Other | Admitting: Ophthalmology

## 2019-01-29 ENCOUNTER — Other Ambulatory Visit: Payer: Self-pay

## 2019-01-29 DIAGNOSIS — H353131 Nonexudative age-related macular degeneration, bilateral, early dry stage: Secondary | ICD-10-CM | POA: Diagnosis not present

## 2019-01-29 DIAGNOSIS — H43813 Vitreous degeneration, bilateral: Secondary | ICD-10-CM

## 2019-01-29 DIAGNOSIS — I10 Essential (primary) hypertension: Secondary | ICD-10-CM

## 2019-01-29 DIAGNOSIS — H35371 Puckering of macula, right eye: Secondary | ICD-10-CM

## 2019-01-29 DIAGNOSIS — H35033 Hypertensive retinopathy, bilateral: Secondary | ICD-10-CM | POA: Diagnosis not present

## 2019-03-02 ENCOUNTER — Other Ambulatory Visit: Payer: Self-pay

## 2019-03-02 ENCOUNTER — Encounter: Payer: Self-pay | Admitting: Neurology

## 2019-03-02 ENCOUNTER — Ambulatory Visit (INDEPENDENT_AMBULATORY_CARE_PROVIDER_SITE_OTHER): Payer: Medicare Other | Admitting: Neurology

## 2019-03-02 VITALS — BP 139/57 | HR 59 | Temp 97.8°F | Ht 66.0 in | Wt 121.3 lb

## 2019-03-02 DIAGNOSIS — R413 Other amnesia: Secondary | ICD-10-CM

## 2019-03-02 DIAGNOSIS — N3941 Urge incontinence: Secondary | ICD-10-CM

## 2019-03-02 NOTE — Progress Notes (Signed)
Reason for visit: Memory disturbance  Regina [name] is an 85 y.o. female  History of present illness:  Regina [name] is an 85 year old right-handed white female with a history of confusion that began towards the end of July 2019.  The patient had what appeared to be a delirium state, but a lumbar puncture revealed an elevated CSF white blood count and protein level.  The patient may have had a viral encephalitis that has left her with some primary cognitive changes.  The patient comes in with her husband who claims that she still has not gotten her confidence back about performing activities of daily living, she no longer operates a motor vehicle.  She does have some memory issues, but this has not worsened over time.  She reports that she is somewhat limited in her physical abilities because of some back or hip discomfort that may be on the left or right side that requires that she rest after being up for 15 to 20 minutes.  The patient has not had any recent falls.  She does have urinary incontinence and frequency and urgency.  She claims that this issue prevents her from going out to public places as much.  The patient gets up 3-4 times at night to urinate.  She is able to get back to sleep fairly well.  She claims that she does sleep well at night and she has a good appetite.  She has in the past had trouble with syncope if she takes a hot shower, she has been able to avoid this by lowering the water temperature. The patient returns to this office for an evaluation.  Past Medical History:  Diagnosis Date  . Allergy   . Barrett esophagus 2010  . Cancer ([place])   . GERD (gastroesophageal reflux disease)   . Hypertension   . Thyroid disease     Past Surgical History:  Procedure Laterality Date  . APPENDECTOMY    . COLON SURGERY    . COLONOSCOPY    . COLONOSCOPY N/A 08/03/2014   Procedure: COLONOSCOPY;  Surgeon: Inda Castle, MD;  Location: [facility];  Service: Endoscopy;  Laterality: N/A;   . POLYPECTOMY    . TONSILLECTOMY    . TOTAL THYROIDECTOMY    . TUBAL LIGATION      Family History  Problem Relation Age of Onset  . Heart disease Mother   . Heart disease Father   . Diabetes Sister   . COPD Brother   . Diabetes Brother     Social history:  reports that she has never smoked. She has never used smokeless tobacco. She reports that she does not drink alcohol or use drugs.    Allergies  Allergen Reactions  . Penicillins Hives and Itching    Medications:  Prior to Admission medications   Medication Sig Start Date End Date Taking? Authorizing Provider  calcitRIOL (ROCALTROL) 0.25 MCG capsule Take 0.25 mcg by mouth daily.    [provider]  Carboxymethylcellulose Sod PF (REFRESH CELLUVISC) 1 % GEL Apply 1 drop to eye as needed.    [provider]  carvedilol (COREG) 3.125 MG tablet Take 1 tablet (3.125 mg total) by mouth 2 (two) times daily. 03/13/18 03/13/19  Elgergawy, Silver Huguenin, MD  furosemide (LASIX) 40 MG tablet Please take 40 mg QPM  as needed for volume overload or leg edema 03/13/18   Elgergawy, Silver Huguenin, MD  L-Methylfolate-Algae (DEPLIN 15 PO) Take 1 tablet by mouth daily.  [provider]  levothyroxine (SYNTHROID, LEVOTHROID) 88 MCG tablet Take 88 mcg by mouth daily before breakfast.    [provider]  Multiple Vitamin (MULTIVITAMIN WITH MINERALS) TABS tablet Take 1 tablet by mouth daily. 03/14/18   Elgergawy, Silver Huguenin, MD  Polyvinyl Alcohol-Povidone (REFRESH OP) Apply 1 drop to eye daily as needed (dry eyes).    [provider]    ROS:  Out of a complete 14 system review of symptoms, the patient complains only of the following symptoms, and all other reviewed systems are negative.  Fatigue, feeling cold Memory problems  Blood pressure (!) 139/57, pulse (!) 59, temperature 97.8 F (36.6 C), temperature source Temporal, height 5\' 6"  (1.676 m), weight 121 lb 5 oz (55 kg).  Physical Exam  General: The patient is  alert and cooperative at the time of the examination.  Skin: No significant peripheral edema is noted.   Neurologic Exam  Mental status: The patient is alert and oriented x 3 at the time of the examination. The Mini-Mental status examination done today shows a total score 28/30.   Cranial nerves: Facial symmetry is present. Speech is normal, no aphasia or dysarthria is noted. Extraocular movements are full. Visual fields are full.  Affect appears to be flat.  Motor: The patient has good strength in all 4 extremities.  Sensory examination: Soft touch sensation is symmetric on the face, arms, and legs.  Coordination: The patient has good finger-nose-finger and heel-to-shin bilaterally.  Gait and station: The patient has a normal gait, she is somewhat stooped. Tandem gait is normal. Romberg is negative. No drift is seen.  Reflexes: Deep tendon reflexes are symmetric.   Assessment/Plan:  1.  Transient delirium state, possible viral encephalitis  2.  Mild memory problem, static encephalopathy  3.  Urinary incontinence, urgency, frequency  4.  Low back pain, hip pain  The patient will have a referral to urology for the bladder issue, this may allow the patient to engage in going to more public activities.  The memory issue appears to be stable, we will follow this over time.  She will follow-up here in 6 months.  Jill Alexanders MD 03/02/2019 10:38 AM  [facility] [address] [place], [place] [address]  Phone [phone] Fax [phone]

## 2019-06-07 ENCOUNTER — Other Ambulatory Visit: Payer: Self-pay | Admitting: *Deleted

## 2019-06-07 DIAGNOSIS — Z20822 Contact with and (suspected) exposure to covid-19: Secondary | ICD-10-CM

## 2019-06-09 LAB — NOVEL CORONAVIRUS, NAA: SARS-CoV-2, NAA: DETECTED — AB

## 2019-09-07 ENCOUNTER — Ambulatory Visit (INDEPENDENT_AMBULATORY_CARE_PROVIDER_SITE_OTHER): Payer: Medicare Other | Admitting: Neurology

## 2019-09-07 ENCOUNTER — Other Ambulatory Visit: Payer: Self-pay

## 2019-09-07 ENCOUNTER — Encounter: Payer: Self-pay | Admitting: Neurology

## 2019-09-07 VITALS — BP 104/70 | HR 59 | Temp 96.9°F | Ht 66.0 in | Wt 119.4 lb

## 2019-09-07 DIAGNOSIS — R413 Other amnesia: Secondary | ICD-10-CM

## 2019-09-07 DIAGNOSIS — R410 Disorientation, unspecified: Secondary | ICD-10-CM | POA: Diagnosis not present

## 2019-09-07 NOTE — Patient Instructions (Signed)
It was great to meet you both today!  Memory score was stable today, please continue to follow with your primary doctor.   We will see you in 6 months

## 2019-09-07 NOTE — Progress Notes (Signed)
PATIENT: Regina [name] DOB: Aug 12, 1933  REASON FOR VISIT: follow up HISTORY FROM: patient  HISTORY OF PRESENT ILLNESS: Today 09/07/19  Regina [name] is an 86 year old female with history of confusion that began towards the end of July 2019.  Her condition appeared to be delirium, but lumbar puncture revealed an elevated CSF white blood cell count and protein level.  She may have had a viral encephalitis.  She has reported still not returning back to baseline since.  She has urinary incontinence, frequency, and urgency.  She was sent to urology, was started on Myrbetriq.  This has been helpful, but she still has some urgency and burning.  She still does not have much confidence in performing her daily activities, now requiring assistance.  She is able to do the laundry, and some housework.  She continues to have some memory issues, but has remained stable.  She reports with her memory, it is hard for her to keep up, because there have been some many changes, such as, streets, and stores.  Her husband manages her medications, she does not do much cooking.  They have a daughter who helps them, they have an adopted 34 year old son with cerebral palsy.  She has not had any falls.  She presents today for evaluation accompanied by her husband.  Her husband says she is about 50% back to baseline since her illness, overall has remained stable since last seen.    HISTORY 03/02/2019 Dr. Jannifer Franklin: Regina [name] is an 86 year old right-handed white female with a history of confusion that began towards the end of July 2019.  The patient had what appeared to be a delirium state, but a lumbar puncture revealed an elevated CSF white blood count and protein level.  The patient may have had a viral encephalitis that has left her with some primary cognitive changes.  The patient comes in with her husband who claims that she still has not gotten her confidence back about performing activities of daily living, she no longer operates a  motor vehicle.  She does have some memory issues, but this has not worsened over time.  She reports that she is somewhat limited in her physical abilities because of some back or hip discomfort that may be on the left or right side that requires that she rest after being up for 15 to 20 minutes.  The patient has not had any recent falls.  She does have urinary incontinence and frequency and urgency.  She claims that this issue prevents her from going out to public places as much.  The patient gets up 3-4 times at night to urinate.  She is able to get back to sleep fairly well.  She claims that she does sleep well at night and she has a good appetite.  She has in the past had trouble with syncope if she takes a hot shower, she has been able to avoid this by lowering the water temperature. The patient returns to this office for an evaluation.  REVIEW OF SYSTEMS: Out of a complete 14 system review of symptoms, the patient complains only of the following symptoms, and all other reviewed systems are negative.  Memory loss  ALLERGIES: Allergies  Allergen Reactions  . Penicillins Hives and Itching    HOME MEDICATIONS: Outpatient Medications Prior to Visit  Medication Sig Dispense Refill  . calcitRIOL (ROCALTROL) 0.25 MCG capsule Take 0.25 mcg by mouth daily.    . Carboxymethylcellulose Sod PF (REFRESH CELLUVISC) 1 % GEL Apply 1  drop to eye as needed.    . carvedilol (COREG) 3.125 MG tablet Take 1 tablet (3.125 mg total) by mouth 2 (two) times daily. 60 tablet 11  . furosemide (LASIX) 40 MG tablet Please take 40 mg QPM  as needed for volume overload or leg edema 30 tablet 11  . L-Methylfolate-Algae (DEPLIN 15 PO) Take 1 tablet by mouth daily.    Marland Kitchen levothyroxine (SYNTHROID) 75 MCG tablet Take 75 mcg by mouth daily before breakfast.    . Multiple Vitamin (MULTIVITAMIN WITH MINERALS) TABS tablet Take 1 tablet by mouth daily.    Marland Kitchen MYRBETRIQ 50 MG TB24 tablet Take 50 mg by mouth daily.    . Polyvinyl  Alcohol-Povidone (REFRESH OP) Apply 1 drop to eye daily as needed (dry eyes).    Marland Kitchen levothyroxine (SYNTHROID, LEVOTHROID) 88 MCG tablet Take 88 mcg by mouth daily before breakfast.     No facility-administered medications prior to visit.    PAST MEDICAL HISTORY: Past Medical History:  Diagnosis Date  . Allergy   . Barrett esophagus 2010  . Cancer ([place])   . GERD (gastroesophageal reflux disease)   . Hypertension   . Thyroid disease     PAST SURGICAL HISTORY: Past Surgical History:  Procedure Laterality Date  . APPENDECTOMY    . COLON SURGERY    . COLONOSCOPY    . COLONOSCOPY N/A 08/03/2014   Procedure: COLONOSCOPY;  Surgeon: Inda Castle, MD;  Location: [facility];  Service: Endoscopy;  Laterality: N/A;  . POLYPECTOMY    . TONSILLECTOMY    . TOTAL THYROIDECTOMY    . TUBAL LIGATION      FAMILY HISTORY: Family History  Problem Relation Age of Onset  . Heart disease Mother   . Heart disease Father   . Diabetes Sister   . COPD Brother   . Diabetes Brother     SOCIAL HISTORY: Social History   Socioeconomic History  . Marital status: Married    Spouse name: Not on file  . Number of children: Not on file  . Years of education: Not on file  . Highest education level: Not on file  Occupational History  . Not on file  Tobacco Use  . Smoking status: Never Smoker  . Smokeless tobacco: Never Used  Substance and Sexual Activity  . Alcohol use: No  . Drug use: No  . Sexual activity: Not Currently  Other Topics Concern  . Not on file  Social History Narrative   Lives: Pt currently at    [facility], [place], [place] [address]   Phone: [phone]      Caffeine use:   Social Determinants of Health   Financial Resource Strain:   . Difficulty of Paying Living Expenses: Not on file  Food Insecurity:   . Worried About Charity fundraiser in the Last Year: Not on file  . Ran Out of Food in the Last Year: Not  on file  Transportation Needs:   . Lack of Transportation (Medical): Not on file  . Lack of Transportation (Non-Medical): Not on file  Physical Activity:   . Days of Exercise per Week: Not on file  . Minutes of Exercise per Session: Not on file  Stress:   . Feeling of Stress : Not on file  Social Connections:   . Frequency of Communication with Friends and Family: Not on file  . Frequency of Social Gatherings with Friends and Family: Not  on file  . Attends Religious Services: Not on file  . Active Member of Clubs or Organizations: Not on file  . Attends Archivist Meetings: Not on file  . Marital Status: Not on file  Intimate Partner Violence:   . Fear of Current or Ex-Partner: Not on file  . Emotionally Abused: Not on file  . Physically Abused: Not on file  . Sexually Abused: Not on file   PHYSICAL EXAM  Vitals:   09/07/19 1033  BP: 104/70  Pulse: (!) 59  Temp: (!) 96.9 F (36.1 C)  TempSrc: Oral  Weight: 119 lb 6.4 oz (54.2 kg)  Height: 5\' 6"  (1.676 m)   Body mass index is 19.27 kg/m.  Generalized: Well developed, in no acute distress  MMSE - Mini Mental State Exam 09/07/2019 03/02/2019 09/01/2018  Not completed: (No Data) - -  Orientation to time 5 5 4   Orientation to Place 4 4 5   Registration 3 3 3   Attention/ Calculation 2 5 5   Recall 3 2 2   Language- name 2 objects 2 2 2   Language- repeat 1 1 1   Language- follow 3 step command 3 3 3   Language- read & follow direction 1 1 1   Write a sentence 1 1 1   Copy design 0 1 1  Total score 25 28 28     Neurological examination  Mentation: Alert oriented to time, place, most history is provided by husband, follows all commands speech and language fluent, flat affect Cranial nerve II-XII: Pupils were equal round reactive to light. Extraocular movements were full, visual field were full on confrontational test. Facial sensation and strength were normal. Head turning and shoulder shrug were normal and  symmetric. Motor: Good strength of all extremities Sensory: Sensory testing is intact to soft touch on all 4 extremities. No evidence of extinction is noted.  Coordination: Cerebellar testing reveals good finger-nose-finger bilaterally Gait and station: Her gait is normal, does have a stooped posture, tandem gait is mildly unsteady, Romberg is negative Reflexes: Deep tendon reflexes are symmetric and normal bilaterally.   DIAGNOSTIC DATA (LABS, IMAGING, TESTING) - I reviewed patient records, labs, notes, testing and imaging myself where available.  Lab Results  Component Value Date   WBC 13.7 (H) 03/13/2018   HGB 10.5 (L) 03/13/2018   HCT 31.0 (L) 03/13/2018   MCV 94.2 03/13/2018   PLT 244 03/13/2018      Component Value Date/Time   NA 137 03/18/2018 1048   K 4.1 03/18/2018 1048   CL 97 03/18/2018 1048   CO2 25 03/18/2018 1048   GLUCOSE 166 (H) 03/18/2018 1048   GLUCOSE 150 (H) 03/13/2018 0306   BUN 20 03/18/2018 1048   CREATININE 1.10 (H) 03/18/2018 1048   CALCIUM 9.4 03/18/2018 1048   PROT 5.8 (L) 03/18/2018 1048   ALBUMIN 3.4 (L) 03/18/2018 1048   AST 23 03/18/2018 1048   ALT 20 03/18/2018 1048   ALKPHOS 60 03/18/2018 1048   BILITOT 0.3 03/18/2018 1048   GFRNONAA 47 (L) 03/18/2018 1048   GFRAA 54 (L) 03/18/2018 1048   No results found for: CHOL, HDL, LDLCALC, LDLDIRECT, TRIG, CHOLHDL No results found for: HGBA1C Lab Results  Component Value Date   VITAMINB12 487 03/09/2018   Lab Results  Component Value Date   TSH 4.398 03/09/2018   ASSESSMENT AND PLAN 86 y.o. year old female  has a past medical history of Allergy, Barrett esophagus (2010), Cancer ([place]), GERD (gastroesophageal reflux disease), Hypertension, and Thyroid disease. here with:  1.  Transient delirium state, possible viral encephalitis 2.  Mild memory problem, static encephalopathy  She may have had a viral illness resulting in an encephalitis, her mental status has gradually improved, but has yet  to return to back to her full baseline prior to her illness in 2019.  Her husband thinks she is maybe 50% back to baseline.  Her memory is stable, 25/30.  She does not have much confidence in her daily activities, requiring assistance.  We will continue to follow her over time, she has remained stable since last seen.  She will continue follow-up with her primary doctor, otherwise follow-up here in 6 months or sooner if needed.   I spent 15 minutes with the patient. 50% of this time was spent discussing her plan of care.   Butler Denmark, AGNP-C, DNP 09/07/2019, 11:13 AM [facility] [address] [place], [place] [address] [phone]

## 2019-09-08 NOTE — Progress Notes (Signed)
I have read the note, and I agree with the clinical assessment and plan.  Mikell Kazlauskas K Arlee Santosuosso   

## 2019-11-02 ENCOUNTER — Encounter (INDEPENDENT_AMBULATORY_CARE_PROVIDER_SITE_OTHER): Payer: Medicare Other | Admitting: Ophthalmology

## 2019-11-02 DIAGNOSIS — H35371 Puckering of macula, right eye: Secondary | ICD-10-CM | POA: Diagnosis not present

## 2019-11-02 DIAGNOSIS — H353132 Nonexudative age-related macular degeneration, bilateral, intermediate dry stage: Secondary | ICD-10-CM

## 2019-11-02 DIAGNOSIS — H43813 Vitreous degeneration, bilateral: Secondary | ICD-10-CM

## 2019-11-02 DIAGNOSIS — I10 Essential (primary) hypertension: Secondary | ICD-10-CM | POA: Diagnosis not present

## 2019-11-02 DIAGNOSIS — H35033 Hypertensive retinopathy, bilateral: Secondary | ICD-10-CM

## 2020-02-02 ENCOUNTER — Encounter (INDEPENDENT_AMBULATORY_CARE_PROVIDER_SITE_OTHER): Payer: Medicare Other | Admitting: Ophthalmology

## 2020-03-06 ENCOUNTER — Other Ambulatory Visit: Payer: Self-pay

## 2020-03-06 ENCOUNTER — Ambulatory Visit (INDEPENDENT_AMBULATORY_CARE_PROVIDER_SITE_OTHER): Payer: Medicare Other | Admitting: Neurology

## 2020-03-06 ENCOUNTER — Encounter: Payer: Self-pay | Admitting: Neurology

## 2020-03-06 VITALS — BP 148/58 | HR 56 | Ht 66.0 in | Wt 114.0 lb

## 2020-03-06 DIAGNOSIS — R410 Disorientation, unspecified: Secondary | ICD-10-CM

## 2020-03-06 DIAGNOSIS — R413 Other amnesia: Secondary | ICD-10-CM | POA: Diagnosis not present

## 2020-03-06 NOTE — Patient Instructions (Addendum)
Continue to follow with your primary doctor  See you back in 4 months or sooner if needed

## 2020-03-06 NOTE — Progress Notes (Addendum)
PATIENT: Regina [name] DOB: 1933/02/16  REASON FOR VISIT: follow up HISTORY FROM: patient  HISTORY OF PRESENT ILLNESS: Today 03/06/20  Ms. Regina [name] is a 86 year old female with history of confusion around July 2019.  It was thought she had delirium, LP revealed elevated CSF WBC count, protein level.  Possibly had viral encephalitis.  Has not returned back to baseline since.  Has urinary incontinence, frequency, urgency.  Urology started [facility].  Does not have much confidence performing daily activities.  Over time, her thinking is slower, it takes her longer to make decisions, and she is moving slower.  Says she hurts all over, swelling in her legs, wearing compression stockings, chronic problem.  Somewhat irritable at home. Is consumed with cleanliness, does multiple loads of laundry.  Able to take her own shower.  No falls, is very cautious, walks stooped over.  Feels over time, her health is overall deteriorating.  Feels like she has constant UTI.  Denies hallucinations.  Flat affect.  Her husband is taking her to get hearing aids.  She denies depression.  Tremor at times to hands when eating, no resting tremor. They have a 25 year old adopted child, husband has to care for.  She does not drive.  Presents today for evaluation accompanied by her husband. MMSE 29/30.  HISTORY 09/07/2019 SS: Ms. Test is an 86 year old female with history of confusion that began towards the end of July 2019.  Her condition appeared to be delirium, but lumbar puncture revealed an elevated CSF white blood cell count and protein level.  She may have had a viral encephalitis.  She has reported still not returning back to baseline since.  She has urinary incontinence, frequency, and urgency.  She was sent to urology, was started on Myrbetriq.  This has been helpful, but she still has some urgency and burning.  She still does not have much confidence in performing her daily activities, now requiring assistance.  She is able to do  the laundry, and some housework.  She continues to have some memory issues, but has remained stable.  She reports with her memory, it is hard for her to keep up, because there have been some many changes, such as, streets, and stores.  Her husband manages her medications, she does not do much cooking.  They have a daughter who helps them, they have an adopted 24 year old son with cerebral palsy.  She has not had any falls.  She presents today for evaluation accompanied by her husband.  Her husband says she is about 50% back to baseline since her illness, overall has remained stable since last seen.     REVIEW OF SYSTEMS: Out of a complete 14 system review of symptoms, the patient complains only of the following symptoms, and all other reviewed systems are negative.  Memory loss  ALLERGIES: Allergies  Allergen Reactions   Penicillins Hives and Itching    HOME MEDICATIONS: Outpatient Medications Prior to Visit  Medication Sig Dispense Refill   calcitRIOL (ROCALTROL) 0.25 MCG capsule Take 0.25 mcg by mouth daily.     Carboxymethylcellulose Sod PF (REFRESH CELLUVISC) 1 % GEL Apply 1 drop to eye as needed.     furosemide (LASIX) 40 MG tablet Please take 40 mg QPM  as needed for volume overload or leg edema 30 tablet 11   L-Methylfolate-Algae (DEPLIN 15 PO) Take 1 tablet by mouth daily.     levothyroxine (SYNTHROID) 75 MCG tablet Take 75 mcg by mouth daily before breakfast.  Multiple Vitamin (MULTIVITAMIN WITH MINERALS) TABS tablet Take 1 tablet by mouth daily.     MYRBETRIQ 50 MG TB24 tablet Take 50 mg by mouth daily.     Polyvinyl Alcohol-Povidone (REFRESH OP) Apply 1 drop to eye daily as needed (dry eyes).     carvedilol (COREG) 3.125 MG tablet Take 1 tablet (3.125 mg total) by mouth 2 (two) times daily. 60 tablet 11   No facility-administered medications prior to visit.    PAST MEDICAL HISTORY: Past Medical History:  Diagnosis Date   Allergy    Barrett esophagus 2010    Cancer ([place])    GERD (gastroesophageal reflux disease)    Hypertension    Thyroid disease     PAST SURGICAL HISTORY: Past Surgical History:  Procedure Laterality Date   APPENDECTOMY     COLON SURGERY     COLONOSCOPY     COLONOSCOPY N/A 08/03/2014   Procedure: COLONOSCOPY;  Surgeon: Inda Castle, MD;  Location: [facility];  Service: Endoscopy;  Laterality: N/A;   POLYPECTOMY     TONSILLECTOMY     TOTAL THYROIDECTOMY     TUBAL LIGATION      FAMILY HISTORY: Family History  Problem Relation Age of Onset   Heart disease Mother    Heart disease Father    Diabetes Sister    COPD Brother    Diabetes Brother     SOCIAL HISTORY: Social History   Socioeconomic History   Marital status: Married    Spouse name: Not on file   Number of children: Not on file   Years of education: Not on file   Highest education level: Not on file  Occupational History   Not on file  Tobacco Use   Smoking status: Never Smoker   Smokeless tobacco: Never Used  Substance and Sexual Activity   Alcohol use: No   Drug use: No   Sexual activity: Not Currently  Other Topics Concern   Not on file  Social History Narrative   Lives: Pt currently at    [facility], [place]   [address] [place] [address]   Phone: [phone]      Caffeine use:   Social Determinants of Health   Financial Resource Strain:    Difficulty of Paying Living Expenses:   Food Insecurity:    Worried About Charity fundraiser in the Last Year:    Arboriculturist in the Last Year:   Transportation Needs:    Film/video editor (Medical):    Lack of Transportation (Non-Medical):   Physical Activity:    Days of Exercise per Week:    Minutes of Exercise per Session:   Stress:    Feeling of Stress :   Social Connections:    Frequency of Communication with Friends and Family:    Frequency of Social Gatherings with Friends and Family:      Attends Religious Services:    Active Member of Clubs or Organizations:    Attends Archivist Meetings:    Marital Status:   Intimate Partner Violence:    Fear of Current or Ex-Partner:    Emotionally Abused:    Physically Abused:    Sexually Abused:    PHYSICAL EXAM  Vitals:   03/06/20 1045  BP: (!) 148/58  Pulse: 56  Weight: 114 lb (51.7 kg)  Height: 5\' 6"  (1.676 m)   Body mass index is 18.4 kg/m.  Generalized: Well developed, in no  acute distress  MMSE - Mini Mental State Exam 03/06/2020 09/07/2019 03/02/2019  Not completed: - (No Data) -  Orientation to time 5 5 5   Orientation to Place 5 4 4   Registration 3 3 3   Attention/ Calculation 5 2 5   Recall 2 3 2   Language- name 2 objects 2 2 2   Language- repeat 1 1 1   Language- follow 3 step command 3 3 3   Language- read & follow direction 1 1 1   Write a sentence 1 1 1   Copy design 1 0 1  Total score 29 25 28     Neurological examination  Mentation: Alert oriented to time, place, history taking. Follows all commands speech and language fluent.  Flat affect. Cranial nerve II-XII: Pupils were equal round reactive to light. Extraocular movements were full, visual field were full on confrontational test. Facial sensation and strength were normal. Head turning and shoulder shrug were normal and symmetric. Motor: The motor testing reveals 5 over 5 strength of all 4 extremities. Good symmetric motor tone is noted throughout.  Handwriting sample is clear, spiral drawing is good.  No tremor noted. Sensory: Sensory testing is intact to soft touch on all 4 extremities. No evidence of extinction is noted.  Coordination: Cerebellar testing reveals good finger-nose-finger and heel-to-shin bilaterally.  Gait and station: Slow to rise, has to push off from seated position to stand, has a stooped posture, gait is slow, cautious, some decreased arm swing on the right Reflexes: Deep tendon reflexes are symmetric and normal  bilaterally.   DIAGNOSTIC DATA (LABS, IMAGING, TESTING) - I reviewed patient records, labs, notes, testing and imaging myself where available.  Lab Results  Component Value Date   WBC 13.7 (H) 03/13/2018   HGB 10.5 (L) 03/13/2018   HCT 31.0 (L) 03/13/2018   MCV 94.2 03/13/2018   PLT 244 03/13/2018      Component Value Date/Time   NA 137 03/18/2018 1048   K 4.1 03/18/2018 1048   CL 97 03/18/2018 1048   CO2 25 03/18/2018 1048   GLUCOSE 166 (H) 03/18/2018 1048   GLUCOSE 150 (H) 03/13/2018 0306   BUN 20 03/18/2018 1048   CREATININE 1.10 (H) 03/18/2018 1048   CALCIUM 9.4 03/18/2018 1048   PROT 5.8 (L) 03/18/2018 1048   ALBUMIN 3.4 (L) 03/18/2018 1048   AST 23 03/18/2018 1048   ALT 20 03/18/2018 1048   ALKPHOS 60 03/18/2018 1048   BILITOT 0.3 03/18/2018 1048   GFRNONAA 47 (L) 03/18/2018 1048   GFRAA 54 (L) 03/18/2018 1048   No results found for: CHOL, HDL, LDLCALC, LDLDIRECT, TRIG, CHOLHDL No results found for: HGBA1C Lab Results  Component Value Date   VITAMINB12 487 03/09/2018   Lab Results  Component Value Date   TSH 4.398 03/09/2018   ASSESSMENT AND PLAN 86 y.o. year old female  has a past medical history of Allergy, Barrett esophagus (2010), Cancer ([place]), GERD (gastroesophageal reflux disease), Hypertension, and Thyroid disease. here with:  1.  Transient delirium state, possible viral encephalitis 2.  Mild memory problem, static encephalopathy  Has still not completely recovered, has been noted to be moving slower, taking longer with her cognitive processing, daily tasks.  No falls.  She has a flat affect, appears irritable.  Does not have much confidence in her daily activities.  Previous EEG has been normal.  She has had repeat LP, was improving in October 2019.  At some point, second opinion was entertained.  Will review this with Dr. Jannifer Franklin, consider if  any additional testing needs to be ordered, given her lack of continued improvement, if anything her daily  function seems to be declining over time.  Memory was good, 29/30 today.  She will follow-up in 4 months or sooner if needed.  Addendum 03/15/20: I called the patient, no further testing at this time, at next follow-up appointment with Dr. Jannifer Franklin, will determine if LP needs to be repeated or not, if feel things are worsening at home.   I spent 30 minutes of face-to-face and non-face-to-face time with patient.  This included previsit chart review, lab review, study review, order entry, electronic health record documentation, patient education.  Butler Denmark, AGNP-C, DNP 03/06/2020, 10:58 AM [facility] [address], [place], [place] [address] [phone]

## 2020-03-07 NOTE — Progress Notes (Signed)
I have read the note, and I agree with the clinical assessment and plan.  Draven Laine K Tyannah Sane   

## 2020-04-22 ENCOUNTER — Emergency Department (HOSPITAL_COMMUNITY)
Admission: EM | Admit: 2020-04-22 | Discharge: 2020-04-22 | Disposition: A | Discharge status: Home / Self Care | Payer: Medicare Other | Attending: Emergency Medicine | Admitting: Emergency Medicine

## 2020-04-22 ENCOUNTER — Emergency Department (HOSPITAL_COMMUNITY): Payer: Medicare Other

## 2020-04-22 ENCOUNTER — Encounter (HOSPITAL_COMMUNITY): Payer: Self-pay | Admitting: Emergency Medicine

## 2020-04-22 DIAGNOSIS — Z859 Personal history of malignant neoplasm, unspecified: Secondary | ICD-10-CM | POA: Insufficient documentation

## 2020-04-22 DIAGNOSIS — I5033 Acute on chronic diastolic (congestive) heart failure: Secondary | ICD-10-CM | POA: Diagnosis not present

## 2020-04-22 DIAGNOSIS — I110 Hypertensive heart disease with heart failure: Secondary | ICD-10-CM | POA: Diagnosis not present

## 2020-04-22 DIAGNOSIS — E039 Hypothyroidism, unspecified: Secondary | ICD-10-CM | POA: Insufficient documentation

## 2020-04-22 DIAGNOSIS — R600 Localized edema: Secondary | ICD-10-CM | POA: Diagnosis not present

## 2020-04-22 DIAGNOSIS — L03114 Cellulitis of left upper limb: Secondary | ICD-10-CM | POA: Insufficient documentation

## 2020-04-22 DIAGNOSIS — Z79899 Other long term (current) drug therapy: Secondary | ICD-10-CM | POA: Insufficient documentation

## 2020-04-22 LAB — CBC WITH DIFFERENTIAL/PLATELET
Abs Immature Granulocytes: 0.02 10*3/uL (ref 0.00–0.07)
Basophils Absolute: 0.1 10*3/uL (ref 0.0–0.1)
Basophils Relative: 1 %
Eosinophils Absolute: 0.1 10*3/uL (ref 0.0–0.5)
Eosinophils Relative: 1 %
HCT: 34.7 % — ABNORMAL LOW (ref 36.0–46.0)
Hemoglobin: 11 g/dL — ABNORMAL LOW (ref 12.0–15.0)
Immature Granulocytes: 0 %
Lymphocytes Relative: 26 %
Lymphs Abs: 1.8 10*3/uL (ref 0.7–4.0)
MCH: 31.6 pg (ref 26.0–34.0)
MCHC: 31.7 g/dL (ref 30.0–36.0)
MCV: 99.7 fL (ref 80.0–100.0)
Monocytes Absolute: 0.8 10*3/uL (ref 0.1–1.0)
Monocytes Relative: 11 %
Neutro Abs: 4.4 10*3/uL (ref 1.7–7.7)
Neutrophils Relative %: 61 %
Platelets: 161 10*3/uL (ref 150–400)
RBC: 3.48 MIL/uL — ABNORMAL LOW (ref 3.87–5.11)
RDW: 12.9 % (ref 11.5–15.5)
WBC: 7.1 10*3/uL (ref 4.0–10.5)
nRBC: 0 % (ref 0.0–0.2)

## 2020-04-22 LAB — BASIC METABOLIC PANEL
Anion gap: 11 (ref 5–15)
BUN: 29 mg/dL — ABNORMAL HIGH (ref 8–23)
CO2: 28 mmol/L (ref 22–32)
Calcium: 9.4 mg/dL (ref 8.9–10.3)
Chloride: 100 mmol/L (ref 98–111)
Creatinine, Ser: 1.17 mg/dL — ABNORMAL HIGH (ref 0.44–1.00)
GFR calc Af Amer: 49 mL/min — ABNORMAL LOW (ref >60)
GFR calc non Af Amer: 42 mL/min — ABNORMAL LOW (ref >60)
Glucose, Bld: 97 mg/dL (ref 70–99)
Potassium: 3.9 mmol/L (ref 3.5–5.1)
Sodium: 139 mmol/L (ref 135–145)

## 2020-04-22 MED ORDER — ONDANSETRON 4 MG PO TBDP: 4.0000 mg | ORAL_TABLET | Freq: Three times a day (TID) | ORAL | 0 refills | Status: DC | PRN

## 2020-04-22 MED ORDER — CEPHALEXIN 250 MG PO CAPS
500.0000 mg | ORAL_CAPSULE | Freq: Once | ORAL | Status: AC
  Administered 2020-04-22: 500 mg via ORAL
  Filled 2020-04-22: qty 2

## 2020-04-22 MED ORDER — CEPHALEXIN 500 MG PO CAPS: 500.0000 mg | ORAL_CAPSULE | Freq: Two times a day (BID) | ORAL | 0 refills | Status: DC

## 2020-04-22 MED ORDER — DOXYCYCLINE HYCLATE 100 MG PO TABS
100.0000 mg | ORAL_TABLET | Freq: Once | ORAL | Status: AC
  Administered 2020-04-22: 100 mg via ORAL
  Filled 2020-04-22: qty 1

## 2020-04-22 MED ORDER — DOXYCYCLINE HYCLATE 100 MG PO CAPS: 100.0000 mg | ORAL_CAPSULE | Freq: Two times a day (BID) | ORAL | 0 refills | Status: DC

## 2020-04-22 MED ORDER — ONDANSETRON 4 MG PO TBDP
4.0000 mg | ORAL_TABLET | Freq: Once | ORAL | Status: AC
  Administered 2020-04-22: 4 mg via ORAL
  Filled 2020-04-22: qty 1

## 2020-04-22 NOTE — ED Notes (Signed)
Verbalized understanding of DC instructions, Rx, follow up care 

## 2020-04-22 NOTE — ED Triage Notes (Signed)
Emergency Medicine Provider Triage Evaluation Note  Regina [name] , a 86 y.o. female  was evaluated in triage.  Pt complains of progressively worsening left wrist wound. Husband provides majority of history. He states that on Saturday she scraped her hand against an object and sustained a skin avulsion. PCP applied Steri-Strips. Since then she has had progressively worsening pain, swelling, and abnormal drainage which she states is malodorous. Not currently on any antibiotics.  Review of Systems  Positive: Hand pain, wound Negative: Numbness, weakness, fever  Physical Exam  BP (!) 153/53 (BP Location: Right Arm)   Pulse (!) 55   Temp 99.4 F (37.4 C) (Oral)   Resp 17   SpO2 100%  Gen:   Awake, no distress   HEENT:  Atraumatic  Resp:  Normal effort  Cardiac:  Normal rate, 2+ radial pulses bilaterally Abd:   Nondistended, nontender  MSK:   able to flex and extend digits against resistance without difficulty good grip strength bilaterally. See below image. Patient with wound overlying the dorsal aspect of the left wrist. Tenderness to palpation involving the ulnar aspect of the wrist extending along the fourth and fifth metacarpals. Swelling noted to the dorsum of the left hand. Neuro:  Speech clear, sensation intact to light touch of bilateral hands.       Medical Decision Making  Medically screening exam initiated at 1:36 PM.  Appropriate orders placed.  JOVANKA WESTGATE was informed that the remainder of the evaluation will be completed by another provider, this initial triage assessment does not replace that evaluation, and the importance of remaining in the ED until their evaluation is complete.  Clinical Impression  Concern for wound infection. Will obtain labs and x-rays to exclude underlying fracture. Patient does not appear overtly septic. She is neurovascularly intact at this time.   Renita Papa, PA-C 04/22/20 1339

## 2020-04-22 NOTE — ED Provider Notes (Signed)
[facility] EMERGENCY DEPARTMENT Provider Note   CSN: [id number] Arrival date & time: 04/22/20  1252     History Chief Complaint  Patient presents with  . Wound Check    Regina [name] is a 86 y.o. female.  The history is provided by the patient, the spouse and medical records. No language interpreter was used.   Regina [name] is a 86 y.o. female who presents to the Emergency Department complaining of wound check. She presents the emergency department accompanied by her husband for wound check. One week ago she bumped her left hand on item and sustained a skin tear. She has been following with her doctor for the last few days. Last time she saw her doctor was on Thursday. Some Steri-Strips and a tegaderm were applied at that time. She noticed on Friday that was there was increased feeling to the hand as well as a malodorous drainage. She denies any fevers or systemic symptoms. She is right-hand dominant. She has pain to the dorsal wrist and hand but is able to move her hand without difficulty.    Past Medical History:  Diagnosis Date  . Allergy   . Barrett esophagus 2010  . Cancer ([place])   . GERD (gastroesophageal reflux disease)   . Hypertension   . Thyroid disease     Patient Active Problem List   Diagnosis Date Noted  . Malnutrition of moderate degree 03/10/2018  . Acute on chronic diastolic CHF (congestive heart failure) ([place])   . Hip pain   . Acute cystitis without hematuria   . Delirium   . Memory loss   . Ataxia 03/09/2018  . Weakness generalized 03/09/2018  . Acute renal failure superimposed on chronic kidney disease ([place]) 03/09/2018  . Weakness 03/09/2018  . Internal hemorrhoids with complication 52/77/8242  . Ulcer of lower limb ([place]) 08/02/2014  . Chronic diastolic heart failure ([place]) 08/01/2014  . Right bundle branch block 08/01/2014  . Essential hypertension 08/01/2014  . Hypothyroidism 08/01/2014  . Rectal bleeding 07/31/2014  .  Hypertension 07/31/2014  . GERD (gastroesophageal reflux disease) 07/31/2014  . Anemia due to other cause 07/31/2014  . Lower GI bleeding   . Faintness   . Syncope 07/30/2014  . Varicose veins of lower extremities with other complications 35/36/1443    Past Surgical History:  Procedure Laterality Date  . APPENDECTOMY    . COLON SURGERY    . COLONOSCOPY    . COLONOSCOPY N/A 08/03/2014   Procedure: COLONOSCOPY;  Surgeon: Inda Castle, MD;  Location: [facility];  Service: Endoscopy;  Laterality: N/A;  . POLYPECTOMY    . TONSILLECTOMY    . TOTAL THYROIDECTOMY    . TUBAL LIGATION       OB History   No obstetric history on file.     Family History  Problem Relation Age of Onset  . Heart disease Mother   . Heart disease Father   . Diabetes Sister   . COPD Brother   . Diabetes Brother     Social History   Tobacco Use  . Smoking status: Never Smoker  . Smokeless tobacco: Never Used  Substance Use Topics  . Alcohol use: No  . Drug use: No    Home Medications Prior to Admission medications   Medication Sig Start Date End Date Taking? Authorizing Provider  calcitRIOL (ROCALTROL) 0.25 MCG capsule Take 0.25 mcg by mouth daily.    [provider]  Carboxymethylcellulose Sod PF (REFRESH CELLUVISC) 1 %  GEL Apply 1 drop to eye as needed.    [provider]  carvedilol (COREG) 3.125 MG tablet Take 1 tablet (3.125 mg total) by mouth 2 (two) times daily. 03/13/18 09/07/19  Elgergawy, Silver Huguenin, MD  cephALEXin (KEFLEX) 500 MG capsule Take 1 capsule (500 mg total) by mouth 2 (two) times daily. 04/22/20   Quintella Reichert, MD  doxycycline (VIBRAMYCIN) 100 MG capsule Take 1 capsule (100 mg total) by mouth 2 (two) times daily. 04/22/20   Quintella Reichert, MD  furosemide (LASIX) 40 MG tablet Please take 40 mg QPM  as needed for volume overload or leg edema 03/13/18   Elgergawy, Silver Huguenin, MD  L-Methylfolate-Algae (DEPLIN 15 PO) Take 1 tablet by mouth daily.    [provider]  levothyroxine (SYNTHROID) 75 MCG tablet Take 75 mcg by mouth daily before breakfast.    [provider]  Multiple Vitamin (MULTIVITAMIN WITH MINERALS) TABS tablet Take 1 tablet by mouth daily. 03/14/18   Elgergawy, Silver Huguenin, MD  MYRBETRIQ 50 MG TB24 tablet Take 50 mg by mouth daily. 08/12/19   [provider]  ondansetron (ZOFRAN ODT) 4 MG disintegrating tablet Take 1 tablet (4 mg total) by mouth every 8 (eight) hours as needed for nausea or vomiting. 04/22/20   Quintella Reichert, MD  Polyvinyl Alcohol-Povidone (REFRESH OP) Apply 1 drop to eye daily as needed (dry eyes).    [provider]    Allergies    Penicillins  Review of Systems   Review of Systems  All other systems reviewed and are negative.   Physical Exam Updated Vital Signs BP (!) 174/54 (BP Location: Right Arm)   Pulse 61   Temp 98.2 F (36.8 C) (Oral)   Resp 16   SpO2 100%   Physical Exam Vitals and nursing note reviewed.  Constitutional:      Appearance: She is well-developed.  HENT:     Head: Normocephalic and atraumatic.  Cardiovascular:     Rate and Rhythm: Normal rate and regular rhythm.  Pulmonary:     Effort: Pulmonary effort is normal. No respiratory distress.  Musculoskeletal:     Comments: 2+ radial pulses bilaterally.  There is moderate edema to the left dorsal hand with ROM intact in the digits, wrist.  There is a well approximated skin tear to the left dorsal wrist.  There is a foul smell from the wound with no significant drainage.  No crepitance or abscess.    Skin:    General: Skin is warm and dry.  Neurological:     Mental Status: She is alert and oriented to person, place, and time.  Psychiatric:        Behavior: Behavior normal.      ED Results / Procedures / Treatments   Labs (all labs ordered are listed, but only abnormal results are displayed) Labs Reviewed  BASIC METABOLIC PANEL - Abnormal; Notable for the following components:      Result  Value   BUN 29 (*)    Creatinine, Ser 1.17 (*)    GFR calc non Af Amer 42 (*)    GFR calc Af Amer 49 (*)    All other components within normal limits  CBC WITH DIFFERENTIAL/PLATELET - Abnormal; Notable for the following components:   RBC 3.48 (*)    Hemoglobin 11.0 (*)    HCT 34.7 (*)    All other components within normal limits    EKG None  Radiology DG Wrist Complete Left  Result Date:  04/22/2020 CLINICAL DATA:  Pain and swelling.  History of laceration. EXAM: LEFT HAND - COMPLETE 3+ VIEW; LEFT WRIST - COMPLETE 3+ VIEW COMPARISON:  None. FINDINGS: Mild age related degenerative changes are noted but no acute bony findings or destructive bony changes. No radiopaque foreign body is identified. IMPRESSION: No acute bony findings or radiopaque foreign body. Electronically Signed   By: Marijo Sanes M.D.   On: 04/22/2020 14:35   DG Hand Complete Left  Result Date: 04/22/2020 CLINICAL DATA:  Pain and swelling.  History of laceration. EXAM: LEFT HAND - COMPLETE 3+ VIEW; LEFT WRIST - COMPLETE 3+ VIEW COMPARISON:  None. FINDINGS: Mild age related degenerative changes are noted but no acute bony findings or destructive bony changes. No radiopaque foreign body is identified. IMPRESSION: No acute bony findings or radiopaque foreign body. Electronically Signed   By: Marijo Sanes M.D.   On: 04/22/2020 14:35    Procedures Irrigation and debridement  Date/Time: 04/22/2020 11:29 PM Performed by: Quintella Reichert, MD Authorized by: Quintella Reichert, MD  Preparation: Patient was prepped and draped in the usual sterile fashion. Local anesthesia used: no  Anesthesia: Local anesthesia used: no  Sedation: Patient sedated: no  Patient tolerance: patient tolerated the procedure well with no immediate complications Comments: Skin tear to the left dorsal wrist was cleansed with chlorhexidine. It was irrigated with 1 L normal saline. Bacitracin ointment and gauze dressing applied.    (including  critical care time)  Medications Ordered in ED Medications  doxycycline (VIBRA-TABS) tablet 100 mg (100 mg Oral Given 04/22/20 2217)  cephALEXin (KEFLEX) capsule 500 mg (500 mg Oral Given 04/22/20 2217)  ondansetron (ZOFRAN-ODT) disintegrating tablet 4 mg (4 mg Oral Given 04/22/20 2218)    ED Course  I have reviewed the triage vital signs and the nursing notes.  Pertinent labs & imaging results that were available during my care of the patient were reviewed by me and considered in my medical decision making (see chart for details).    MDM Rules/Calculators/A&P                         Patient here for evaluation of wound check after sustaining a skin tear to the left dorsal wrist one week prior. There was no animal bite. On examination she has cellulitis. There is a foul smell to the wound but no evidence of abscess, deep tissue space infection or necrotizing soft tissue infection. Discussed with patient local wound care as well as antibiotics. Discussed outpatient follow-up and return precautions.  Final Clinical Impression(s) / ED Diagnoses Final diagnoses:  Cellulitis of left upper extremity    Rx / DC Orders ED Discharge Orders         Ordered    cephALEXin (KEFLEX) 500 MG capsule  2 times daily        04/22/20 2227    doxycycline (VIBRAMYCIN) 100 MG capsule  2 times daily        04/22/20 2227    ondansetron (ZOFRAN ODT) 4 MG disintegrating tablet  Every 8 hours PRN        04/22/20 2227           Quintella Reichert, MD 04/22/20 2331

## 2020-04-22 NOTE — ED Triage Notes (Signed)
Husband stated, she hurt her left posterior hand and the Dr. Kelly Splinter it. Now it looks infected with an odor. Steri strips were applied.

## 2020-07-20 ENCOUNTER — Encounter: Payer: Self-pay | Admitting: Neurology

## 2020-07-20 ENCOUNTER — Other Ambulatory Visit: Payer: Self-pay

## 2020-07-20 ENCOUNTER — Ambulatory Visit (INDEPENDENT_AMBULATORY_CARE_PROVIDER_SITE_OTHER): Payer: Medicare Other | Admitting: Neurology

## 2020-07-20 VITALS — BP 190/72 | HR 68 | Ht 65.0 in | Wt 120.8 lb

## 2020-07-20 DIAGNOSIS — R413 Other amnesia: Secondary | ICD-10-CM

## 2020-07-20 DIAGNOSIS — R269 Unspecified abnormalities of gait and mobility: Secondary | ICD-10-CM

## 2020-07-20 DIAGNOSIS — E538 Deficiency of other specified B group vitamins: Secondary | ICD-10-CM

## 2020-07-20 MED ORDER — CARBIDOPA-LEVODOPA 25-100 MG PO TABS: ORAL_TABLET | ORAL | 2 refills | Status: DC

## 2020-07-20 NOTE — Patient Instructions (Signed)
We will start Sinemet for the walking.  Sinemet (carbidopa) may result in confusion or hallucinations, drowsiness, nausea, or dizziness. If any significant side effects are noted, please contact our office. Sinemet may not be well absorbed when taken with high protein meals, if tolerated it is best to take 30-45 minutes before you eat.

## 2020-07-20 NOTE — Progress Notes (Signed)
Reason for visit: Memory problems, gait instability  Regina [name] is an 86 y.o. female  History of present illness:  Regina [name] is an 86 year old right-handed white female with a history of some memory issues that occurred since she had a presumed viral meningitis in the summer 2019.  The patient returns the office today.  She is sleeping fairly well, there is some report from her husband that her confusion seems to be getting slowly worse over time.  The patient is no longer cooking, she will do housework, she does not operate a motor vehicle.  She needs assistance keeping up with medications and appointments.  She does report some cramping of her legs at nighttime.  She is hard of hearing and she reports that she is cold all the time.  She has leg swelling as well.  She returns to the office today for an evaluation.  Past Medical History:  Diagnosis Date  . Allergy   . Barrett esophagus 2010  . Cancer ([place])   . GERD (gastroesophageal reflux disease)   . Hypertension   . Thyroid disease     Past Surgical History:  Procedure Laterality Date  . APPENDECTOMY    . COLON SURGERY    . COLONOSCOPY    . COLONOSCOPY N/A 08/03/2014   Procedure: COLONOSCOPY;  Surgeon: Inda Castle, MD;  Location: [facility];  Service: Endoscopy;  Laterality: N/A;  . POLYPECTOMY    . TONSILLECTOMY    . TOTAL THYROIDECTOMY    . TUBAL LIGATION      Family History  Problem Relation Age of Onset  . Heart disease Mother   . Heart disease Father   . Diabetes Sister   . COPD Brother   . Diabetes Brother     Social history:  reports that she has never smoked. She has never used smokeless tobacco. She reports that she does not drink alcohol and does not use drugs.    Allergies  Allergen Reactions  . Penicillins Hives and Itching    Medications:  Prior to Admission medications   Medication Sig Start Date End Date Taking? Authorizing Provider  calcitRIOL (ROCALTROL) 0.25 MCG capsule Take 0.25 mcg  by mouth daily.   Yes [provider]  Carboxymethylcellulose Sod PF 1 % GEL Apply 1 drop to eye as needed.   Yes [provider]  furosemide (LASIX) 40 MG tablet Please take 40 mg QPM  as needed for volume overload or leg edema 03/13/18  Yes Elgergawy, Silver Huguenin, MD  L-Methylfolate-Algae (DEPLIN 15 PO) Take 1 tablet by mouth daily.   Yes [provider]  levothyroxine (SYNTHROID) 75 MCG tablet Take 75 mcg by mouth daily before breakfast.   Yes [provider]  MYRBETRIQ 50 MG TB24 tablet Take 50 mg by mouth daily. 08/12/19  Yes [provider]  Polyvinyl Alcohol-Povidone (REFRESH OP) Apply 1 drop to eye daily as needed (dry eyes).   Yes [provider]  carvedilol (COREG) 3.125 MG tablet Take 1 tablet (3.125 mg total) by mouth 2 (two) times daily. 03/13/18 09/07/19  Elgergawy, Silver Huguenin, MD  cephALEXin (KEFLEX) 500 MG capsule Take 1 capsule (500 mg total) by mouth 2 (two) times daily. 04/22/20   Quintella Reichert, MD  doxycycline (VIBRAMYCIN) 100 MG capsule Take 1 capsule (100 mg total) by mouth 2 (two) times daily. 04/22/20   Quintella Reichert, MD  Multiple Vitamin (MULTIVITAMIN WITH MINERALS) TABS tablet Take 1 tablet by mouth daily. 03/14/18   Elgergawy,  Silver Huguenin, MD  ondansetron (ZOFRAN ODT) 4 MG disintegrating tablet Take 1 tablet (4 mg total) by mouth every 8 (eight) hours as needed for nausea or vomiting. 04/22/20   Quintella Reichert, MD    ROS:  Out of a complete 14 system review of symptoms, the patient complains only of the following symptoms, and all other reviewed systems are negative.  Memory troubles Walking difficulty Leg swelling  Blood pressure (!) 190/72, pulse 68, height 5\' 5"  (1.651 m), weight 120 lb 12.8 oz (54.8 kg).  Physical Exam  General: The patient is alert and cooperative at the time of the examination.  Skin: 2-3+ edema below the knees is seen bilaterally.   Neurologic Exam  Mental status: The patient is alert and  oriented x 3 at the time of the examination. The Mini-Mental status examination done today shows a total score 26/30.   Cranial nerves: Facial symmetry is present. Speech is normal, no aphasia or dysarthria is noted. Extraocular movements are full, with exception that there may be some restriction of superior gaze. Visual fields are full.  Masking of the face is seen.  The patient is hard of hearing.  Motor: The patient has good strength in all 4 extremities.  Sensory examination: Soft touch sensation is symmetric on the face, arms, and legs.  Coordination: The patient has good finger-nose-finger and heel-to-shin bilaterally.  Gait and station: The patient is able to walk independently, but she has a stooped posture and arms are in flexion slightly, not swinging.  Romberg is negative.  Reflexes: Deep tendon reflexes are symmetric.   Assessment/Plan:  1.  Memory disturbance  2.  Mild gait disorder, possible parkinsonism  The patient will be sent for MRI of the brain.  She is having some features of parkinsonism on clinical examination, will give a trial on low-dose Sinemet starting at 1/2 tablet 3 times daily with a 25/100 mg tablets.  She will take this for a month and then go to 1 full tablet 3 times a day.  If she notes any worsening confusion on the medication, she will stop the medication.  The patient will follow up in 4 months.  The patient has blood work today.  Jill Alexanders MD 07/20/2020 3:12 PM  [facility] [address] [place], [place] [address]  Phone [phone] Fax [phone]

## 2020-07-21 LAB — RPR: RPR Ser Ql: NONREACTIVE

## 2020-07-21 LAB — VITAMIN B12: Vitamin B-12: 422 pg/mL (ref 232–1245)

## 2020-07-21 LAB — SEDIMENTATION RATE: Sed Rate: 9 mm/hr (ref 0–40)

## 2020-07-24 ENCOUNTER — Telehealth: Payer: Self-pay | Admitting: Neurology

## 2020-07-24 NOTE — Telephone Encounter (Signed)
medicare/aarp order sent to GI. No auth they will reach out to the patient to schedule.  °

## 2020-08-02 ENCOUNTER — Other Ambulatory Visit: Payer: Self-pay

## 2020-08-02 ENCOUNTER — Ambulatory Visit
Admission: RE | Admit: 2020-08-02 | Discharge: 2020-08-02 | Disposition: A | Discharge status: Home / Self Care | Payer: Medicare Other | Source: Ambulatory Visit | Attending: Neurology | Admitting: Neurology

## 2020-08-02 DIAGNOSIS — R413 Other amnesia: Secondary | ICD-10-CM

## 2020-08-02 DIAGNOSIS — R269 Unspecified abnormalities of gait and mobility: Secondary | ICD-10-CM

## 2020-08-03 ENCOUNTER — Telehealth: Payer: Self-pay | Admitting: Neurology

## 2020-08-03 ENCOUNTER — Ambulatory Visit: Payer: Medicare Other | Admitting: Neurology

## 2020-08-03 NOTE — Telephone Encounter (Signed)
I called the patient, talk with husband.  The MRI does show some atrophy affecting the temporal lobes that could be consistent with a memory disorder.  Mild small vessel disease is seen, no change from 2019.  The patient tried one half of a tablet of Sinemet and had dizziness, they stopped the medication.  If they wish to go on medication for memory such as Aricept, we can get something started, they will call if desired.    MRI brain 08/02/20:  IMPRESSION: This MRI of the brain without contrast shows the following: 1.   Mild generalized cortical atrophy.  The extent of atrophy in the medial temporal lobe has slightly progressed compared to the 04/02/2018 MRI. 2.   T2/flair hyperintense foci in the hemispheres consistent with mild chronic microvascular ischemic change, unchanged compared to the 2019 MRI. 3.   There are no acute findings.

## 2020-11-06 ENCOUNTER — Encounter (INDEPENDENT_AMBULATORY_CARE_PROVIDER_SITE_OTHER): Payer: Medicare Other | Admitting: Ophthalmology

## 2020-11-23 ENCOUNTER — Encounter: Payer: Self-pay | Admitting: Neurology

## 2020-11-23 ENCOUNTER — Telehealth: Payer: Self-pay | Admitting: Neurology

## 2020-11-23 ENCOUNTER — Ambulatory Visit: Payer: Medicare Other | Admitting: Neurology

## 2020-11-23 NOTE — Telephone Encounter (Signed)
This patient did not show for a revisit appointment today. 

## 2020-11-29 ENCOUNTER — Telehealth: Payer: Self-pay | Admitting: Neurology

## 2020-11-29 NOTE — Telephone Encounter (Signed)
Pt husband(on DPR)has called in an attempt to schedule pt's f/u with Dr Jannifer Franklin. Under the advisement of Daiva Huge pt was told that a message will be sent to Dr Jannifer Franklin re: who pt's f/u needs to be with since there is not future availability with Dr Jannifer Franklin. Pt will await a response from Dr Jannifer Franklin

## 2020-12-28 ENCOUNTER — Ambulatory Visit (INDEPENDENT_AMBULATORY_CARE_PROVIDER_SITE_OTHER): Payer: Medicare Other | Admitting: Neurology

## 2020-12-28 ENCOUNTER — Encounter: Payer: Self-pay | Admitting: Neurology

## 2020-12-28 VITALS — BP 180/64 | HR 66 | Ht 65.0 in | Wt 120.0 lb

## 2020-12-28 DIAGNOSIS — R269 Unspecified abnormalities of gait and mobility: Secondary | ICD-10-CM | POA: Diagnosis not present

## 2020-12-28 DIAGNOSIS — R413 Other amnesia: Secondary | ICD-10-CM | POA: Diagnosis not present

## 2020-12-28 MED ORDER — CARBIDOPA-LEVODOPA 25-100 MG PO TABS: 0.5000 | ORAL_TABLET | Freq: Two times a day (BID) | ORAL | 6 refills | Status: DC

## 2020-12-28 NOTE — Patient Instructions (Signed)
Try Sinemet taking 1/2 tablet twice daily with food to help with the movement  See you back in 4-6 months  Call for adverse effect

## 2020-12-28 NOTE — Progress Notes (Signed)
PATIENT: Regina [name] DOB: 1932/12/23  REASON FOR VISIT: follow up HISTORY FROM: patient  HISTORY OF PRESENT ILLNESS: Today 12/28/20 Regina [name] is an 87 year old female with history of memory issues.  MRI of the brain in December 2021 showed atrophy affecting temporal lobes could be consistent with memory disorder.  Mild small vessel disease, no change from 2019.  She has some Parkinson features, tried 1/2 of Sinemet resulted in dizziness, she stopped it. Memory isn't all that bad, gets confused about things. Is scared to take a shower by herself, has no confidence. No fall since last seen. They adopted their 69 year grandson, has handicap issues. She worries about everything. Is not active, mostly sit around. She does her own laundry. Sleeps well, good appetite. Moves slow, has muscle soreness all over.  Here today for evaluation accompanied by her husband.  HISTORY  07/20/2020 Dr. Jannifer Franklin: Regina [name] is an 87 year old right-handed white female with a history of some memory issues that occurred since she had a presumed viral meningitis in the summer 2019.  The patient returns the office today.  She is sleeping fairly well, there is some report from her husband that her confusion seems to be getting slowly worse over time.  The patient is no longer cooking, she will do housework, she does not operate a motor vehicle.  She needs assistance keeping up with medications and appointments.  She does report some cramping of her legs at nighttime.  She is hard of hearing and she reports that she is cold all the time.  She has leg swelling as well.  She returns to the office today for an evaluation.  REVIEW OF SYSTEMS: Out of a complete 14 system review of symptoms, the patient complains only of the following symptoms, and all other reviewed systems are negative.  See HPI  ALLERGIES: Allergies  Allergen Reactions  . Penicillins Hives and Itching    HOME MEDICATIONS: Outpatient Medications Prior to Visit   Medication Sig Dispense Refill  . calcitRIOL (ROCALTROL) 0.25 MCG capsule Take 0.25 mcg by mouth daily.    . Carboxymethylcellulose Sod PF 1 % GEL Apply 1 drop to eye as needed.    . furosemide (LASIX) 40 MG tablet Please take 40 mg QPM  as needed for volume overload or leg edema 30 tablet 11  . L-Methylfolate-Algae (DEPLIN 15 PO) Take 1 tablet by mouth daily.    Marland Kitchen levothyroxine (SYNTHROID) 75 MCG tablet Take 75 mcg by mouth daily before breakfast.    . MYRBETRIQ 50 MG TB24 tablet Take 50 mg by mouth daily.    . Polyvinyl Alcohol-Povidone (REFRESH OP) Apply 1 drop to eye daily as needed (dry eyes).    . carvedilol (COREG) 3.125 MG tablet Take 1 tablet (3.125 mg total) by mouth 2 (two) times daily. 60 tablet 11  . cephALEXin (KEFLEX) 500 MG capsule Take 1 capsule (500 mg total) by mouth 2 (two) times daily. 14 capsule 0  . doxycycline (VIBRAMYCIN) 100 MG capsule Take 1 capsule (100 mg total) by mouth 2 (two) times daily. 20 capsule 0  . Multiple Vitamin (MULTIVITAMIN WITH MINERALS) TABS tablet Take 1 tablet by mouth daily.    . ondansetron (ZOFRAN ODT) 4 MG disintegrating tablet Take 1 tablet (4 mg total) by mouth every 8 (eight) hours as needed for nausea or vomiting. 20 tablet 0   No facility-administered medications prior to visit.    PAST MEDICAL HISTORY: Past Medical History:  Diagnosis Date  . Allergy   .  Barrett esophagus 2010  . Cancer ([place])   . GERD (gastroesophageal reflux disease)   . Hypertension   . Thyroid disease     PAST SURGICAL HISTORY: Past Surgical History:  Procedure Laterality Date  . APPENDECTOMY    . COLON SURGERY    . COLONOSCOPY    . COLONOSCOPY N/A 08/03/2014   Procedure: COLONOSCOPY;  Surgeon: Inda Castle, MD;  Location: [facility];  Service: Endoscopy;  Laterality: N/A;  . POLYPECTOMY    . TONSILLECTOMY    . TOTAL THYROIDECTOMY    . TUBAL LIGATION      FAMILY HISTORY: Family History  Problem Relation Age of Onset  . Heart disease Mother    . Heart disease Father   . Diabetes Sister   . COPD Brother   . Diabetes Brother     SOCIAL HISTORY: Social History   Socioeconomic History  . Marital status: Married    Spouse name: Jeneen Rinks  . Number of children: Not on file  . Years of education: Not on file  . Highest education level: Not on file  Occupational History  . Not on file  Tobacco Use  . Smoking status: Never Smoker  . Smokeless tobacco: Never Used  Substance and Sexual Activity  . Alcohol use: No  . Drug use: No  . Sexual activity: Not Currently  Other Topics Concern  . Not on file  Social History Narrative   Lives with husband and son   Right Handed   Drinks 1-2 cups caffeine daily   Social Determinants of Health   Financial Resource Strain: Not on file  Food Insecurity: Not on file  Transportation Needs: Not on file  Physical Activity: Not on file  Stress: Not on file  Social Connections: Not on file  Intimate Partner Violence: Not on file      PHYSICAL EXAM  Vitals:   12/28/20 1001  BP: (!) 180/64  Pulse: 66  Weight: 120 lb (54.4 kg)  Height: 5\' 5"  (1.651 m)   Body mass index is 19.97 kg/m.  Generalized: Well developed, in no acute distress  MMSE - Mini Mental State Exam 12/28/2020 07/20/2020 03/06/2020  Not completed: - - -  Orientation to time 5 4 5   Orientation to Place 5 5 5   Registration 3 3 3   Attention/ Calculation 1 2 5   Recall 3 3 2   Language- name 2 objects 2 2 2   Language- repeat 1 1 1   Language- follow 3 step command 3 3 3   Language- read & follow direction 1 1 1   Write a sentence 0 1 1  Copy design 1 1 1   Total score 25 26 29     Neurological examination  Mentation: Alert oriented to time, place, history taking. Follows all commands speech and language fluent, anxious, elderly female Cranial nerve II-XII: Pupils were equal round reactive to light. Extraocular movements were full, visual field were full on confrontational test. Facial sensation and strength were  normal. Head turning and shoulder shrug  were normal and symmetric.  Masking of the face is seen.  Hard of hearing. Motor: Good strength of all extremities, bradykinesia is noted  Sensory: Sensory testing is intact to soft touch on all 4 extremities. No evidence of extinction is noted.  Coordination: Cerebellar testing reveals good finger-nose-finger and heel-to-shin bilaterally.  Gait and station: Has to push off from seated position to stand, slow to rise, can walk independently, posture is stooped, decreased arm swing but are in flexion somewhat, gait is  slow, but I wouldn't say shuffling Reflexes: Deep tendon reflexes are symmetric  DIAGNOSTIC DATA (LABS, IMAGING, TESTING) - I reviewed patient records, labs, notes, testing and imaging myself where available.  Lab Results  Component Value Date   WBC 7.1 04/22/2020   HGB 11.0 (L) 04/22/2020   HCT 34.7 (L) 04/22/2020   MCV 99.7 04/22/2020   PLT 161 04/22/2020      Component Value Date/Time   NA 139 04/22/2020 1342   NA 137 03/18/2018 1048   K 3.9 04/22/2020 1342   CL 100 04/22/2020 1342   CO2 28 04/22/2020 1342   GLUCOSE 97 04/22/2020 1342   BUN 29 (H) 04/22/2020 1342   BUN 20 03/18/2018 1048   CREATININE 1.17 (H) 04/22/2020 1342   CALCIUM 9.4 04/22/2020 1342   PROT 5.8 (L) 03/18/2018 1048   ALBUMIN 3.4 (L) 03/18/2018 1048   AST 23 03/18/2018 1048   ALT 20 03/18/2018 1048   ALKPHOS 60 03/18/2018 1048   BILITOT 0.3 03/18/2018 1048   GFRNONAA 42 (L) 04/22/2020 1342   GFRAA 49 (L) 04/22/2020 1342   No results found for: CHOL, HDL, LDLCALC, LDLDIRECT, TRIG, CHOLHDL No results found for: HGBA1C Lab Results  Component Value Date   VITAMINB12 422 07/20/2020   Lab Results  Component Value Date   TSH 4.398 03/09/2018   ASSESSMENT AND PLAN 87 y.o. year old female  has a past medical history of Allergy, Barrett esophagus (2010), Cancer ([place]), GERD (gastroesophageal reflux disease), Hypertension, and Thyroid disease. here  with:  1.  Memory disturbance -MMSE 25/30, stable -MRI of the brain has shown some atrophy affecting temporal lobes that could be consistent with memory disorder, mild small vessel disease no change from 2019 -B12, RPR, sed rate were unremarkable  2.  Gait disorder, possible parkinsonism -Retrial of Sinemet, unclear how much took to result in dizziness initially, try 25/100 mg 1/2 tablet twice daily, start taking with food to prevent stomach upset -Does have bradykinesia, masking of face, stooped posture -Follow-up in 4 to 6 months or sooner if needed, call for dose adjustment  I spent 31 minutes of face-to-face and non-face-to-face time with patient.  This included previsit chart review, lab review, reviewing medications, discussing Sinemet and dose adjustment, signs of PD.  Butler Denmark, AGNP-C, DNP 12/28/2020, 10:17 AM [facility] [address] [place], [place] [address] [phone]

## 2020-12-30 NOTE — Progress Notes (Signed)
I have read the note, and I agree with the clinical assessment and plan.  Naveen Lorusso K Areal Cochrane   

## 2021-04-09 ENCOUNTER — Inpatient Hospital Stay (HOSPITAL_COMMUNITY)
Admission: EM | Admit: 2021-04-09 | Discharge: 2021-04-12 | DRG: 291 | Disposition: A | Discharge status: Skilled Nursing Facility | Payer: Medicare Other | Attending: Internal Medicine | Admitting: Internal Medicine

## 2021-04-09 ENCOUNTER — Emergency Department (HOSPITAL_COMMUNITY): Payer: Medicare Other

## 2021-04-09 ENCOUNTER — Encounter (HOSPITAL_COMMUNITY): Payer: Self-pay | Admitting: Emergency Medicine

## 2021-04-09 DIAGNOSIS — E039 Hypothyroidism, unspecified: Secondary | ICD-10-CM | POA: Diagnosis present

## 2021-04-09 DIAGNOSIS — L03115 Cellulitis of right lower limb: Secondary | ICD-10-CM | POA: Diagnosis present

## 2021-04-09 DIAGNOSIS — R531 Weakness: Secondary | ICD-10-CM | POA: Diagnosis present

## 2021-04-09 DIAGNOSIS — D649 Anemia, unspecified: Secondary | ICD-10-CM | POA: Diagnosis present

## 2021-04-09 DIAGNOSIS — L03116 Cellulitis of left lower limb: Secondary | ICD-10-CM | POA: Diagnosis present

## 2021-04-09 DIAGNOSIS — Z87440 Personal history of urinary (tract) infections: Secondary | ICD-10-CM | POA: Diagnosis not present

## 2021-04-09 DIAGNOSIS — Z825 Family history of asthma and other chronic lower respiratory diseases: Secondary | ICD-10-CM

## 2021-04-09 DIAGNOSIS — Z79899 Other long term (current) drug therapy: Secondary | ICD-10-CM | POA: Diagnosis not present

## 2021-04-09 DIAGNOSIS — B9620 Unspecified Escherichia coli [E. coli] as the cause of diseases classified elsewhere: Secondary | ICD-10-CM | POA: Diagnosis present

## 2021-04-09 DIAGNOSIS — Z8249 Family history of ischemic heart disease and other diseases of the circulatory system: Secondary | ICD-10-CM | POA: Diagnosis not present

## 2021-04-09 DIAGNOSIS — K219 Gastro-esophageal reflux disease without esophagitis: Secondary | ICD-10-CM | POA: Diagnosis present

## 2021-04-09 DIAGNOSIS — I5033 Acute on chronic diastolic (congestive) heart failure: Secondary | ICD-10-CM | POA: Diagnosis present

## 2021-04-09 DIAGNOSIS — Z833 Family history of diabetes mellitus: Secondary | ICD-10-CM

## 2021-04-09 DIAGNOSIS — Z20822 Contact with and (suspected) exposure to covid-19: Secondary | ICD-10-CM | POA: Diagnosis present

## 2021-04-09 DIAGNOSIS — N390 Urinary tract infection, site not specified: Secondary | ICD-10-CM | POA: Diagnosis not present

## 2021-04-09 DIAGNOSIS — Z79890 Hormone replacement therapy (postmenopausal): Secondary | ICD-10-CM

## 2021-04-09 DIAGNOSIS — E876 Hypokalemia: Secondary | ICD-10-CM | POA: Diagnosis present

## 2021-04-09 DIAGNOSIS — Z85048 Personal history of other malignant neoplasm of rectum, rectosigmoid junction, and anus: Secondary | ICD-10-CM | POA: Diagnosis not present

## 2021-04-09 DIAGNOSIS — I110 Hypertensive heart disease with heart failure: Secondary | ICD-10-CM | POA: Diagnosis present

## 2021-04-09 DIAGNOSIS — I10 Essential (primary) hypertension: Secondary | ICD-10-CM | POA: Diagnosis present

## 2021-04-09 DIAGNOSIS — N3000 Acute cystitis without hematuria: Secondary | ICD-10-CM | POA: Diagnosis not present

## 2021-04-09 LAB — CBC WITH DIFFERENTIAL/PLATELET
Abs Immature Granulocytes: 0.05 10*3/uL (ref 0.00–0.07)
Basophils Absolute: 0 10*3/uL (ref 0.0–0.1)
Basophils Relative: 0 %
Eosinophils Absolute: 0 10*3/uL (ref 0.0–0.5)
Eosinophils Relative: 0 %
HCT: 29.5 % — ABNORMAL LOW (ref 36.0–46.0)
Hemoglobin: 9.4 g/dL — ABNORMAL LOW (ref 12.0–15.0)
Immature Granulocytes: 0 %
Lymphocytes Relative: 7 %
Lymphs Abs: 0.8 10*3/uL (ref 0.7–4.0)
MCH: 32.1 pg (ref 26.0–34.0)
MCHC: 31.9 g/dL (ref 30.0–36.0)
MCV: 100.7 fL — ABNORMAL HIGH (ref 80.0–100.0)
Monocytes Absolute: 0.9 10*3/uL (ref 0.1–1.0)
Monocytes Relative: 8 %
Neutro Abs: 9.4 10*3/uL — ABNORMAL HIGH (ref 1.7–7.7)
Neutrophils Relative %: 85 %
Platelets: 148 10*3/uL — ABNORMAL LOW (ref 150–400)
RBC: 2.93 MIL/uL — ABNORMAL LOW (ref 3.87–5.11)
RDW: 13.5 % (ref 11.5–15.5)
WBC: 11.2 10*3/uL — ABNORMAL HIGH (ref 4.0–10.5)
nRBC: 0 % (ref 0.0–0.2)

## 2021-04-09 LAB — URINALYSIS, ROUTINE W REFLEX MICROSCOPIC
Bilirubin Urine: NEGATIVE
Glucose, UA: NEGATIVE mg/dL
Ketones, ur: 5 mg/dL — AB
Nitrite: NEGATIVE
Protein, ur: 30 mg/dL — AB
Specific Gravity, Urine: 1.008 (ref 1.005–1.030)
WBC, UA: >50 WBC/hpf — ABNORMAL HIGH (ref 0–5)
pH: 7 (ref 5.0–8.0)

## 2021-04-09 LAB — COMPREHENSIVE METABOLIC PANEL
ALT: <5 U/L (ref 0–44)
AST: 19 U/L (ref 15–41)
Albumin: 3 g/dL — ABNORMAL LOW (ref 3.5–5.0)
Alkaline Phosphatase: 57 U/L (ref 38–126)
Anion gap: 10 (ref 5–15)
BUN: 21 mg/dL (ref 8–23)
CO2: 24 mmol/L (ref 22–32)
Calcium: 8.4 mg/dL — ABNORMAL LOW (ref 8.9–10.3)
Chloride: 105 mmol/L (ref 98–111)
Creatinine, Ser: 1.33 mg/dL — ABNORMAL HIGH (ref 0.44–1.00)
GFR, Estimated: 39 mL/min — ABNORMAL LOW (ref >60)
Glucose, Bld: 116 mg/dL — ABNORMAL HIGH (ref 70–99)
Potassium: 3.4 mmol/L — ABNORMAL LOW (ref 3.5–5.1)
Sodium: 139 mmol/L (ref 135–145)
Total Bilirubin: 1.3 mg/dL — ABNORMAL HIGH (ref 0.3–1.2)
Total Protein: 6 g/dL — ABNORMAL LOW (ref 6.5–8.1)

## 2021-04-09 LAB — I-STAT CHEM 8, ED
BUN: 21 mg/dL (ref 8–23)
Calcium, Ion: 1.17 mmol/L (ref 1.15–1.40)
Chloride: 101 mmol/L (ref 98–111)
Creatinine, Ser: 1.3 mg/dL — ABNORMAL HIGH (ref 0.44–1.00)
Glucose, Bld: 121 mg/dL — ABNORMAL HIGH (ref 70–99)
HCT: 35 % — ABNORMAL LOW (ref 36.0–46.0)
Hemoglobin: 11.9 g/dL — ABNORMAL LOW (ref 12.0–15.0)
Potassium: 3.7 mmol/L (ref 3.5–5.1)
Sodium: 140 mmol/L (ref 135–145)
TCO2: 29 mmol/L (ref 22–32)

## 2021-04-09 LAB — RESP PANEL BY RT-PCR (FLU A&B, COVID) ARPGX2
Influenza A by PCR: NEGATIVE
Influenza B by PCR: NEGATIVE
SARS Coronavirus 2 by RT PCR: NEGATIVE

## 2021-04-09 LAB — MAGNESIUM: Magnesium: 1.8 mg/dL (ref 1.7–2.4)

## 2021-04-09 LAB — POC OCCULT BLOOD, ED: Fecal Occult Bld: NEGATIVE

## 2021-04-09 LAB — LACTIC ACID, PLASMA: Lactic Acid, Venous: 1.3 mmol/L (ref 0.5–1.9)

## 2021-04-09 LAB — AMMONIA: Ammonia: 22 umol/L (ref 9–35)

## 2021-04-09 MED ORDER — ACETAMINOPHEN 650 MG RE SUPP: 650.0000 mg | Freq: Four times a day (QID) | RECTAL | Status: DC | PRN

## 2021-04-09 MED ORDER — SODIUM CHLORIDE 0.9 % IV SOLN
1.0000 g | INTRAVENOUS | Status: DC
  Administered 2021-04-10: 1 g via INTRAVENOUS
  Filled 2021-04-09: qty 10

## 2021-04-09 MED ORDER — ENOXAPARIN SODIUM 30 MG/0.3ML IJ SOSY
30.0000 mg | PREFILLED_SYRINGE | INTRAMUSCULAR | Status: DC
  Administered 2021-04-09 – 2021-04-12 (×4): 30 mg via SUBCUTANEOUS
  Filled 2021-04-09 (×4): qty 0.3

## 2021-04-09 MED ORDER — SENNOSIDES-DOCUSATE SODIUM 8.6-50 MG PO TABS
1.0000 | ORAL_TABLET | Freq: Every evening | ORAL | Status: DC | PRN
  Administered 2021-04-12: 1 via ORAL
  Filled 2021-04-09: qty 1

## 2021-04-09 MED ORDER — ONDANSETRON HCL 4 MG PO TABS: 4.0000 mg | ORAL_TABLET | Freq: Four times a day (QID) | ORAL | Status: DC | PRN

## 2021-04-09 MED ORDER — CARBIDOPA-LEVODOPA 25-100 MG PO TABS
0.5000 | ORAL_TABLET | Freq: Two times a day (BID) | ORAL | Status: DC
  Administered 2021-04-09 – 2021-04-12 (×7): 0.5 via ORAL
  Filled 2021-04-09 (×2): qty 0.5
  Filled 2021-04-09: qty 1
  Filled 2021-04-09 (×5): qty 0.5

## 2021-04-09 MED ORDER — SODIUM CHLORIDE 0.9 % IV SOLN
1.0000 g | Freq: Once | INTRAVENOUS | Status: AC
  Administered 2021-04-09: 1 g via INTRAVENOUS
  Filled 2021-04-09: qty 10

## 2021-04-09 MED ORDER — SODIUM CHLORIDE 0.9 % IV SOLN
1000.0000 mL | INTRAVENOUS | Status: DC
  Administered 2021-04-09 – 2021-04-11 (×7): 1000 mL via INTRAVENOUS

## 2021-04-09 MED ORDER — ACETAMINOPHEN 325 MG PO TABS
650.0000 mg | ORAL_TABLET | Freq: Four times a day (QID) | ORAL | Status: DC | PRN
  Administered 2021-04-09 – 2021-04-12 (×5): 650 mg via ORAL
  Filled 2021-04-09 (×5): qty 2

## 2021-04-09 MED ORDER — SODIUM CHLORIDE 0.9 % IV BOLUS (SEPSIS)
1000.0000 mL | Freq: Once | INTRAVENOUS | Status: AC
  Administered 2021-04-09: 1000 mL via INTRAVENOUS

## 2021-04-09 MED ORDER — LEVOTHYROXINE SODIUM 75 MCG PO TABS
75.0000 ug | ORAL_TABLET | Freq: Every day | ORAL | Status: DC
  Administered 2021-04-10 – 2021-04-12 (×3): 75 ug via ORAL
  Filled 2021-04-09 (×3): qty 1

## 2021-04-09 MED ORDER — CARVEDILOL 3.125 MG PO TABS
3.1250 mg | ORAL_TABLET | Freq: Two times a day (BID) | ORAL | Status: DC
  Administered 2021-04-09 – 2021-04-12 (×7): 3.125 mg via ORAL
  Filled 2021-04-09 (×7): qty 1

## 2021-04-09 MED ORDER — SODIUM CHLORIDE 0.9% FLUSH
3.0000 mL | Freq: Two times a day (BID) | INTRAVENOUS | Status: DC
  Administered 2021-04-09 – 2021-04-12 (×3): 3 mL via INTRAVENOUS

## 2021-04-09 MED ORDER — ONDANSETRON HCL 4 MG/2ML IJ SOLN: 4.0000 mg | Freq: Four times a day (QID) | INTRAMUSCULAR | Status: DC | PRN

## 2021-04-09 NOTE — ED Notes (Signed)
Attempted to call report, advised by [facility] secretary pt was currently dealing with critical pt and would call back when available. Call back number provided.

## 2021-04-09 NOTE — H&P (Signed)
Date: 04/09/2021               Patient Name:  Regina [name] MRN: [id number]  DOB: [birth year] Age / Sex: 87 y.o., female   PCP: [name], MD         Medical Service: Internal Medicine Teaching Service         Attending Physician: Dr. Sid Falcon, MD    First Contact: Buddy Duty, DO Pager: [phone]  Second Contact: Virl Axe, MD Pager: Sandrea Hammond [phone]       After Hours (After 5p/  First Contact Pager: [phone]  weekends / holidays): Second Contact Pager: [phone]   SUBJECTIVE  Chief Complaint: weakness  History of Present Illness: Regina [name] is a 87 y.o. female with a pertinent PMH of hypertension, hypothyroidism, HFpEF, colorectal cancer s/p resection, memory disturbances and gait disorder concerning for Parkinsonism who presents to [facility] with weakness.   Regina [name] presents with progressively worsening weakness for several days. She reports that she has had a functional decline in her health overall over the past several months; however, notes worsening weakness over the past several days. She notes that this morning, she was trying to get up to go use the restroom; however, was unable to do so and required assistance from her husband. She reports subjective fevers/chills but has not checked her temperature at home. She has urinary incontinence at baseline and notes some dysuria as well. She reported some associated mid-sternal chest pain when attempting to use the restroom but denies any ongoing chest pain at this time. She denies any headaches, vision changes, lightheadedness/dizziness, palpitations, shortness of breath, abdominal pain, nausea/vomiting, diarrhea or focal weakness.  Medications: No current facility-administered medications on file prior to encounter.   Current Outpatient Medications on File Prior to Encounter  Medication Sig Dispense Refill   calcitRIOL (ROCALTROL) 0.25 MCG capsule Take 0.25 mcg by mouth daily.     carbidopa-levodopa (SINEMET) 25-100 MG  tablet Take 0.5 tablets by mouth in the morning and at bedtime. 30 tablet 6   Carboxymethylcellulose Sod PF 1 % GEL Apply 1 drop to eye as needed.     carvedilol (COREG) 3.125 MG tablet Take 1 tablet (3.125 mg total) by mouth 2 (two) times daily. 60 tablet 11   furosemide (LASIX) 40 MG tablet Please take 40 mg QPM  as needed for volume overload or leg edema 30 tablet 11   L-Methylfolate-Algae (DEPLIN 15 PO) Take 1 tablet by mouth daily.     levothyroxine (SYNTHROID) 75 MCG tablet Take 75 mcg by mouth daily before breakfast.     MYRBETRIQ 50 MG TB24 tablet Take 50 mg by mouth daily.     Polyvinyl Alcohol-Povidone (REFRESH OP) Apply 1 drop to eye daily as needed (dry eyes).      Past Medical History:  Past Medical History:  Diagnosis Date   Allergy    Barrett esophagus 2010   Cancer ([place])    GERD (gastroesophageal reflux disease)    Hypertension    Thyroid disease     Social:  Patient lives at home with her husband and her 50 year old grandson. She has two sons, one of which is deceased. She uses a walker for ambulation. She reports that her husband was recently diagnosed with prostate cancer and is undergoing treatment for this. She denies any history of tobacco use, alcohol use or illicit drug use.   Family History: Family History  Problem Relation Age of Onset   Heart  disease Mother    Heart disease Father    Diabetes Sister    COPD Brother    Diabetes Brother     Allergies: Allergies as of 04/09/2021 - Review Complete 04/09/2021  Allergen Reaction Noted   Penicillins Hives and Itching 10/06/2008    Review of Systems: A complete ROS was negative except as per HPI.   OBJECTIVE:  Physical Exam: Blood pressure 102/87, pulse 61, temperature 99 F (37.2 C), temperature source Rectal, resp. rate 13, SpO2 100 %. Physical Exam  Constitutional: chronically ill appearing, frail elderly female, no acute distress  HENT: Normocephalic and atraumatic, EOMI, dry  mucous  membranes Cardiovascular: Normal rate, regular rhythm, S1 and S2 present, +systolic murmur, no rubs, gallops.  Distal pulses intact Respiratory: No respiratory distress, no accessory muscle use. Lungs are clear to auscultation bilaterally. On room air  GI: Nondistended, soft, minimal tenderness to palpation in suprapubic region, normal bowel sounds Musculoskeletal: Normal bulk and tone.  1+ pitting edema of bilateral lower extremities; significantly tenderness to palpation of bilat lower extremities  Neurological: Is alert and oriented x4, no apparent focal deficits noted. Skin: Warm and dry.  Bilateral lower anterior shins erythematous, warm and tender consistent with chronic venous stasis. No lesions noted.  Pertinent Labs: CBC    Component Value Date/Time   WBC 11.2 (H) 04/09/2021 0620   RBC 2.93 (L) 04/09/2021 0620   HGB 11.9 (L) 04/09/2021 0640   HCT 35.0 (L) 04/09/2021 0640   PLT 148 (L) 04/09/2021 0620   MCV 100.7 (H) 04/09/2021 0620   MCH 32.1 04/09/2021 0620   MCHC 31.9 04/09/2021 0620   RDW 13.5 04/09/2021 0620   LYMPHSABS 0.8 04/09/2021 0620   MONOABS 0.9 04/09/2021 0620   EOSABS 0.0 04/09/2021 0620   BASOSABS 0.0 04/09/2021 0620     CMP     Component Value Date/Time   NA 140 04/09/2021 0640   NA 137 03/18/2018 1048   K 3.7 04/09/2021 0640   CL 101 04/09/2021 0640   CO2 24 04/09/2021 0620   GLUCOSE 121 (H) 04/09/2021 0640   BUN 21 04/09/2021 0640   BUN 20 03/18/2018 1048   CREATININE 1.30 (H) 04/09/2021 0640   CALCIUM 8.4 (L) 04/09/2021 0620   PROT 6.0 (L) 04/09/2021 0620   PROT 5.8 (L) 03/18/2018 1048   ALBUMIN 3.0 (L) 04/09/2021 0620   ALBUMIN 3.4 (L) 03/18/2018 1048   AST 19 04/09/2021 0620   ALT <5 04/09/2021 0620   ALKPHOS 57 04/09/2021 0620   BILITOT 1.3 (H) 04/09/2021 0620   BILITOT 0.3 03/18/2018 1048   GFRNONAA 39 (L) 04/09/2021 0620   GFRAA 49 (L) 04/22/2020 1342    Pertinent Imaging: DG Chest Port 1 View  Result Date: 04/09/2021 CLINICAL  DATA:  87 year old female with sepsis and weakness. EXAM: PORTABLE CHEST 1 VIEW COMPARISON:  Chest radiographs 07/30/2014 and earlier. FINDINGS: Portable AP semi upright view at 0613 hours. Lower lung volumes. Chronic elevation and/or eventration of the right hemidiaphragm, normal variant. Normal cardiac size and mediastinal contours. Visualized tracheal air column is within normal limits. Allowing for portable technique the lungs are clear. No pneumothorax. No definite pleural effusion. Chronic scoliosis. No acute osseous abnormality identified. Negative visible bowel gas pattern. IMPRESSION: No acute cardiopulmonary abnormality. Electronically Signed   By: Genevie Ann M.D.   On: 04/09/2021 06:40    EKG: personally reviewed my interpretation is normal EKG, normal sinus rhythm, unchanged from previous tracings, RBBB  ASSESSMENT & PLAN:  Assessment: Active Problems:  UTI (urinary tract infection)   TUNISHA MINICOZZI is a 87 y.o. with pertinent PMH of hypertension, hypothyroidism, HFpEF, colorectal cancer s/p resection, memory disturbances and gait disorder concerning for Parkinsonism who presented with weakness and admit for UTI on hospital day 0  Plan: #Weakness #Urinary tract infection Patient is presenting with progressively worsening weakness for several days in the setting of urinary symptoms. She has suprapubic tenderness to palpation on exam. Labs significant for mild leukocytosis and urinalysis consistent with UTI. Prior UTI in 2019 with pan sensitive E.faecalis. Patient received one dose of IV rocephin in the ED.  - IV Rocephin 1g daily  - F/u urine culture - Orthostatic vitals  - PT/OT eval   #HFpEF  #Bilateral lower extremity edema  Patient has a history of chronic diastolic dysfunction for which she takes coreg twice daily and lasix as needed for lower extremity swelling. She notes chronic cellulitis of bilateral lower extremities that is extremely painful. She has 1+ pitting edema of  bilateral lower extremities with tenderness to touch.  - Continue carvedilol 3.'125mg'$  twice daily  - DVT US BLE - If negative, will give one dose of lasix '40mg'$  PO for edema   #Hypothyroidism - Continue Synthroid 21mg daily  #Hx of Parkinson-like symptoms - Continue Sinemet 25-100 0.5 tablet twice daily    Best Practice: Diet: Cardiac diet IVF: Fluids: None  Rate: None VTE: enoxaparin (LOVENOX) injection 30 mg Start: 04/09/21 1000 Code: Full Status: Inpatient with expected length of stay greater than 2 midnights. Anticipated Discharge Location: SNF Barriers to Discharge: Medical stability  Signature: SHarvie Heck MD Internal Medicine Resident, PGY-3 [facility]es[facility] Medicine Residency  Pager: #[phone]:12 PM, 04/09/2021   Please contact the on call pager after 5 pm and on weekends at [phone]

## 2021-04-09 NOTE — ED Provider Notes (Signed)
[facility] EMERGENCY DEPARTMENT Provider Note  CSN: [id number] Arrival date & time: 04/09/21 0532  Chief Complaint(s) Weakness  HPI Regina [name] is a 87 y.o. female    Weakness Severity:  Severe Onset quality:  Gradual Duration: several days. Timing:  Constant Progression:  Worsening Chronicity:  New Context: urinary tract infection (possible. EMS reported that husband mentioned she'd been having dysuria)   Relieved by:  Nothing Worsened by:  Activity Associated symptoms: diarrhea, dysuria, lethargy and myalgias   Associated symptoms: no cough, no foul-smelling urine and no vomiting     Patient extremely lethargic en route with EMS. Improved after 500cc of IVF. She is more alert now. Complaining of myalgia.  Past Medical History Past Medical History:  Diagnosis Date   Allergy    Barrett esophagus 2010   Cancer ([place])    GERD (gastroesophageal reflux disease)    Hypertension    Thyroid disease    Patient Active Problem List   Diagnosis Date Noted   Gait disorder 12/28/2020   Malnutrition of moderate degree 03/10/2018   Acute on chronic diastolic CHF (congestive heart failure) ([place])    Hip pain    Acute cystitis without hematuria    Delirium    Memory loss    Ataxia 03/09/2018   Weakness generalized 03/09/2018   Acute renal failure superimposed on chronic kidney disease ([place]) 03/09/2018   Weakness 03/09/2018   Internal hemorrhoids with complication [id number]   Ulcer of lower limb ([place]) 08/02/2014   Chronic diastolic heart failure ([place]) 08/01/2014   Right bundle branch block 08/01/2014   Essential hypertension 08/01/2014   Hypothyroidism 08/01/2014   Rectal bleeding 07/31/2014   Hypertension 07/31/2014   GERD (gastroesophageal reflux disease) 07/31/2014   Anemia due to other cause 07/31/2014   Lower GI bleeding    Faintness    Syncope 07/30/2014   Varicose veins of lower extremities with other complications [id number]   Home  Medication(s) Prior to Admission medications   Medication Sig Start Date End Date Taking? Authorizing Provider  calcitRIOL (ROCALTROL) 0.25 MCG capsule Take 0.25 mcg by mouth daily.    [provider]  carbidopa-levodopa (SINEMET) 25-100 MG tablet Take 0.5 tablets by mouth in the morning and at bedtime. 12/28/20   Suzzanne Cloud, NP  Carboxymethylcellulose Sod PF 1 % GEL Apply 1 drop to eye as needed.    [provider]  carvedilol (COREG) 3.125 MG tablet Take 1 tablet (3.125 mg total) by mouth 2 (two) times daily. 03/13/18 09/07/19  Elgergawy, Silver Huguenin, MD  furosemide (LASIX) 40 MG tablet Please take 40 mg QPM  as needed for volume overload or leg edema 03/13/18   Elgergawy, Silver Huguenin, MD  L-Methylfolate-Algae (DEPLIN 15 PO) Take 1 tablet by mouth daily.    [provider]  levothyroxine (SYNTHROID) 75 MCG tablet Take 75 mcg by mouth daily before breakfast.    [provider]  MYRBETRIQ 50 MG TB24 tablet Take 50 mg by mouth daily. 08/12/19   [provider]  Polyvinyl Alcohol-Povidone (REFRESH OP) Apply 1 drop to eye daily as needed (dry eyes).    [provider]  Past Surgical History Past Surgical History:  Procedure Laterality Date   APPENDECTOMY     COLON SURGERY     COLONOSCOPY     COLONOSCOPY N/A 08/03/2014   Procedure: COLONOSCOPY;  Surgeon: Inda Castle, MD;  Location: [place];  Service: Endoscopy;  Laterality: N/A;   POLYPECTOMY     TONSILLECTOMY     TOTAL THYROIDECTOMY     TUBAL LIGATION     Family History Family History  Problem Relation Age of Onset   Heart disease Mother    Heart disease Father    Diabetes Sister    COPD Brother    Diabetes Brother     Social History Social History   Tobacco Use   Smoking status: Never   Smokeless tobacco: Never  Substance Use Topics   Alcohol  use: No   Drug use: No   Allergies Penicillins  Review of Systems Review of Systems  Respiratory:  Negative for cough.   Gastrointestinal:  Positive for diarrhea. Negative for vomiting.  Genitourinary:  Positive for dysuria.  Musculoskeletal:  Positive for myalgias.  Neurological:  Positive for weakness.  All other systems are reviewed and are negative for acute change except as noted in the HPI  Physical Exam Vital Signs  I have reviewed the triage vital signs BP (!) 154/56   Pulse 67   Temp 99 F (37.2 C) (Rectal)   Resp 16   SpO2 100%   Physical Exam Vitals reviewed.  Constitutional:      General: She is not in acute distress.    Appearance: She is well-developed. She is ill-appearing. She is not diaphoretic.  HENT:     Head: Normocephalic and atraumatic.     Nose: Nose normal.  Eyes:     General: No scleral icterus.       Right eye: No discharge.        Left eye: No discharge.     Conjunctiva/sclera: Conjunctivae normal.     Pupils: Pupils are equal, round, and reactive to light.  Cardiovascular:     Rate and Rhythm: Normal rate and regular rhythm.     Heart sounds: No murmur heard.   No friction rub. No gallop.  Pulmonary:     Effort: Pulmonary effort is normal. No respiratory distress.     Breath sounds: Normal breath sounds. No stridor. No rales.  Abdominal:     General: There is no distension.     Palpations: Abdomen is soft.     Tenderness: There is no abdominal tenderness.  Musculoskeletal:        General: No tenderness.     Cervical back: Normal range of motion and neck supple.  Skin:    General: Skin is warm and dry.     Findings: No erythema or rash.  Neurological:     Mental Status: She is oriented to person, place, and time. She is lethargic.    ED Results and Treatments Labs (all labs ordered are listed, but only abnormal results are displayed) Labs Reviewed  CBC WITH DIFFERENTIAL/PLATELET - Abnormal; Notable for the following  components:      Result Value   WBC 11.2 (*)    RBC 2.93 (*)    Hemoglobin 9.4 (*)    HCT 29.5 (*)    MCV 100.7 (*)    Platelets 148 (*)    Neutro Abs 9.4 (*)    All other components within normal limits  I-STAT CHEM 8, ED - Abnormal; Notable for the  following components:   Creatinine, Ser 1.30 (*)    Glucose, Bld 121 (*)    Hemoglobin 11.9 (*)    HCT 35.0 (*)    All other components within normal limits  URINE CULTURE  CULTURE, BLOOD (ROUTINE X 2)  CULTURE, BLOOD (ROUTINE X 2)  RESP PANEL BY RT-PCR (FLU A&B, COVID) [id number]  LACTIC ACID, PLASMA  COMPREHENSIVE METABOLIC PANEL  URINALYSIS, ROUTINE W REFLEX MICROSCOPIC  AMMONIA  POC OCCULT BLOOD, ED                                                                                                                         EKG  EKG Interpretation  Date/Time:  Monday April 09 2021 06:09:48 EDT Ventricular Rate:  72 PR Interval:  178 QRS Duration: 147 QT Interval:  453 QTC Calculation: 496 R Axis:   84 Text Interpretation: Sinus rhythm Right bundle branch block No acute changes Confirmed by Addison Lank [phone]) on 04/09/2021 6:30:49 AM       Radiology DG Chest Port 1 View  Result Date: 04/09/2021 CLINICAL DATA:  87 year old female with sepsis and weakness. EXAM: PORTABLE CHEST 1 VIEW COMPARISON:  Chest radiographs 07/30/2014 and earlier. FINDINGS: Portable AP semi upright view at 0613 hours. Lower lung volumes. Chronic elevation and/or eventration of the right hemidiaphragm, normal variant. Normal cardiac size and mediastinal contours. Visualized tracheal air column is within normal limits. Allowing for portable technique the lungs are clear. No pneumothorax. No definite pleural effusion. Chronic scoliosis. No acute osseous abnormality identified. Negative visible bowel gas pattern. IMPRESSION: No acute cardiopulmonary abnormality. Electronically Signed   By: Genevie Ann M.D.   On: 04/09/2021 06:40    Pertinent labs & imaging results  that were available during my care of the patient were reviewed by me and considered in my medical decision making (see MDM for details).  Medications Ordered in ED Medications  sodium chloride 0.9 % bolus 1,000 mL (1,000 mLs Intravenous New Bag/Given 04/09/21 0645)    Followed by  0.9 %  sodium chloride infusion (has no administration in time range)                                                                                                                                     Procedures .1-3 Lead EKG Interpretation  Date/Time: 04/09/2021 6:44 AM Performed by: Fatima Blank, MD Authorized by: Fatima Blank,  MD     Interpretation: normal     ECG rate:  67   ECG rate assessment: normal     Rhythm: sinus rhythm     Ectopy: none     Conduction: normal    (including critical care time)  Medical Decision Making / ED Course I have reviewed the nursing notes for this encounter and the patient's prior records (if available in EHR or on provided paperwork).  KETTI MEMORY was evaluated in Emergency Department on 04/09/2021 for the symptoms described in the history of present illness. She was evaluated in the context of the global COVID-19 pandemic, which necessitated consideration that the patient might be at risk for infection with the SARS-CoV-2 virus that causes COVID-19. Institutional protocols and algorithms that pertain to the evaluation of patients at risk for COVID-19 are in a state of rapid change based on information released by regulatory bodies including the CDC and federal and state organizations. These policies and algorithms were followed during the patient's care in the ED.     Several days of generalized fatigue. Concerned for possible UTI. Septic work up initiated.  Pertinent labs & imaging results that were available during my care of the patient were reviewed by me and considered in my medical decision making:  Cbc with leukocytosis and anemia. Mild  renal insufficency. Chest x-Husak without evidence suggestive of pneumonia, pneumothorax, pneumomediastinum.  No abnormal contour of the mediastinum to suggest dissection. No evidence of acute injuries.  Patient care turned over to oncoming provider. Patient case and results discussed in detail; please see their note for further ED managment.     Final Clinical Impression(s) / ED Diagnoses Final diagnoses:  Generalized weakness     This chart was dictated using voice recognition software.  Despite best efforts to proofread,  errors can occur which can change the documentation meaning.    Fatima Blank, MD 04/09/21 [phone]

## 2021-04-09 NOTE — ED Provider Notes (Signed)
Patient seen after prior EDP.  Patient is feeling mildly improved.  She complains of feeling persistently weak.  Labs are suggestive of UTI.  Patient would likely benefit from admission for further work-up and treatment.  Medicine service is aware of case will evaluate for same.   Valarie Merino, MD 04/09/21 [phone]

## 2021-04-09 NOTE — ED Provider Notes (Signed)
Emergency Medicine Provider Triage Evaluation Note  Regina [name] , a 87 y.o. female  was evaluated in triage.  Pt complains of sick for the past several days.  Dysuria reported.  Got up to go to bathroom this morning but too weak.  Husband states she has been worsening over the past few days.  At baseline she is ambulatory and conversant per husband.  500cc IVF given, does have hx CHF.  Little more talkative with EMS after IVF.    Review of Systems  Positive: Dysuria, AMS, weakness Negative: fever  Physical Exam  BP (!) 144/40 (BP Location: Left Arm)   Pulse 68   Resp 16   SpO2 99%   Gen:   Awake, no distress   Resp:  Normal effort  MSK:   Moves extremities without difficulty  Other:  Appears weak,lethargic, does respond to name called and tactile stimuli  Medical Decision Making  Medically screening exam initiated at 5:32 AM.  Appropriate orders placed.  KRISTEL HOOSER was informed that the remainder of the evaluation will be completed by another provider, this initial triage assessment does not replace that evaluation, and the importance of remaining in the ED until their evaluation is complete.  Appears unwell, lethargic in triage.  Sepsis protocol initiated.  Moved to next available room.   Larene Pickett, PA-C 04/09/21 0554    Fatima Blank, MD 04/09/21 [phone]

## 2021-04-09 NOTE — ED Triage Notes (Signed)
Per EMS, pt from home, has been "sick" for several days, this morning she become very weak, not able to get up.  Reports painful urination.  Initailly pt was only reponsive to painful stimuli however after 500CC [place] pt was talking to EMS reporting back/neck pain.  130/80 80 HR 20G R hand

## 2021-04-09 NOTE — Progress Notes (Signed)
Physical Therapy Evaluation Patient Details Name: Regina [name] MRN: [id number] DOB: 14-Jul-1933 Today's Date: 04/09/2021   History of Present Illness  Pt is a 87yo female presenting to [facility] ED on 8/29 with complaints of weakness and dysuria for several days; suspected secondary to UTI. Chest xray negative for acute findings. PMH: CHF, memory loss, HTN, ataxia.   Clinical Impression  Pt presents with the impairments above and problems listed below. Required max assist +2 for bed mobility to achieve supine to sitting EOB; pt complained of high level of pain and was returned to supine positioning; further mobility deferred. Unable to complete full orthostatic BP readings secondary to pain. Pt has reduced caregiver assistance at home and cares for her son full time. Recommending SNF-level therapies to address her current limitations. We will continue to follow her acutely to promote independence with functional mobility.    Follow Up Recommendations SNF    Equipment Recommendations  Rolling walker with 5" wheels;3in1 (PT)    Recommendations for Other Services       Precautions / Restrictions Precautions Precautions: Fall Restrictions Weight Bearing Restrictions: No      Mobility  Bed Mobility Overal bed mobility: Needs Assistance Bed Mobility: Supine to Sit;Sit to Supine     Supine to sit: Max assist;+2 for physical assistance Sit to supine: Max assist;+2 for physical assistance   General bed mobility comments: Pt required max assist +2 utilizing "helicopter" method to acheive sitting EOB; pt reported pain throughout entirety of mobility task. Pt unable to tolerate sitting EOB to acheive additional BP reading for orthostatic measurements; pt returned to supine positioning via max assist +2.    Transfers                    Ambulation/Gait                Stairs            Wheelchair Mobility    Modified Rankin (Stroke Patients Only)       Balance  Overall balance assessment: Needs assistance Sitting-balance support: Bilateral upper extremity supported;Feet unsupported Sitting balance-Leahy Scale: Poor Sitting balance - Comments: Reliant on BUE and min A for sitting balance                                     Pertinent Vitals/Pain Pain Assessment: Faces Faces Pain Scale: Hurts whole lot Pain Location: Hips, back, neck Pain Descriptors / Indicators: Discomfort;Crying;Grimacing;Moaning Pain Intervention(s): Limited activity within patient's tolerance;Monitored during session;Repositioned    Home Living Family/patient expects to be discharged to:: Private residence Living Arrangements: Spouse/significant other;Children (Husband and 86 year old son) Available Help at Discharge: Family;Available 24 hours/day Type of Home: House Home Access: Stairs to enter Entrance Stairs-Rails: None Entrance Stairs-Number of Steps: 2 Home Layout: One level Home Equipment: Walker - 4 wheels;Cane - single point;Shower seat Additional Comments: Pt's son is "special needs" and requires a lot of assistance.    Prior Function Level of Independence: Needs assistance   Gait / Transfers Assistance Needed: Uses cane at home and rollator for community distances.  ADL's / Homemaking Assistance Needed: Husband cooks as pt with memory loss, helps with dressing and her medications.        Hand Dominance        Extremity/Trunk Assessment   Upper Extremity Assessment Upper Extremity Assessment: Generalized weakness;RUE deficits/detail RUE Deficits / Details: Pt has extensive bruising  of right forearm from wrist to elbow.    Lower Extremity Assessment Lower Extremity Assessment: RLE deficits/detail;LLE deficits/detail RLE Deficits / Details: Pt unable to perform SLR, noted hemosiderin staining of ankle. Very limited ROM secondary to pain at R hip. LLE Deficits / Details: Pt able to perform ~30degree SLR, noted hemosiderin staining, pt  reported this LE started weeping/bleeding last night.    Cervical / Trunk Assessment Cervical / Trunk Assessment: Kyphotic  Communication   Communication: HOH  Cognition Arousal/Alertness: Awake/alert Behavior During Therapy: WFL for tasks assessed/performed Overall Cognitive Status: No family/caregiver present to determine baseline cognitive functioning                                 General Comments: Pt AOx2, unaware of date. Aware that she has some memory deficits and describes "they won't let me cook anymore because I'm so forgetful I'll burn something."      General Comments      Exercises     Assessment/Plan    PT Assessment Patient needs continued PT services  PT Problem List Decreased strength;Decreased activity tolerance;Decreased balance;Decreased mobility;Decreased coordination;Decreased range of motion;Decreased cognition;Decreased safety awareness;Pain       PT Treatment Interventions DME instruction;Gait training;Functional mobility training;Therapeutic activities;Therapeutic exercise;Balance training;Neuromuscular re-education;Patient/family education    PT Goals (Current goals can be found in the Care Plan section)  Acute Rehab PT Goals Patient Stated Goal: to decrease pain PT Goal Formulation: With patient Time For Goal Achievement: 04/23/21 Potential to Achieve Goals: Fair    Frequency Min 2X/week   Barriers to discharge Decreased caregiver support (Husband recently had hand surgery and has lifting precautions. Son is not able to provide assistance secondary to cognition.)      Co-evaluation               AM-PAC PT "6 Clicks" Mobility  Outcome Measure Help needed turning from your back to your side while in a flat bed without using bedrails?: Total Help needed moving from lying on your back to sitting on the side of a flat bed without using bedrails?: Total Help needed moving to and from a bed to a chair (including a  wheelchair)?: Total Help needed standing up from a chair using your arms (e.g., wheelchair or bedside chair)?: Total Help needed to walk in hospital room?: Total Help needed climbing 3-5 steps with a railing? : Total 6 Click Score: 6    End of Session   Activity Tolerance: Patient limited by pain Patient left: in bed;with call bell/phone within reach (On stretcher in ED.) Nurse Communication: Mobility status;Other (comment) (Unable to complete Orthostatic measurements.) PT Visit Diagnosis: Pain;Difficulty in walking, not elsewhere classified (R26.2);Muscle weakness (generalized) (M62.81) Pain - Right/Left: Right Pain - part of body: Hip    Time: [id number] PT Time Calculation (min) (ACUTE ONLY): 22 min   Charges:   PT Evaluation $PT Eval Moderate Complexity: 1 Mod          Dawayne Cirri, SPT Dawayne Cirri 04/09/2021, 1:43 PM

## 2021-04-10 ENCOUNTER — Encounter (HOSPITAL_COMMUNITY): Payer: Medicare Other

## 2021-04-10 DIAGNOSIS — N390 Urinary tract infection, site not specified: Secondary | ICD-10-CM

## 2021-04-10 DIAGNOSIS — R531 Weakness: Secondary | ICD-10-CM | POA: Diagnosis not present

## 2021-04-10 LAB — BLOOD CULTURE ID PANEL (REFLEXED) - BCID2

## 2021-04-10 LAB — CBC
HCT: 27.5 % — ABNORMAL LOW (ref 36.0–46.0)
Hemoglobin: 9.2 g/dL — ABNORMAL LOW (ref 12.0–15.0)
MCH: 32.2 pg (ref 26.0–34.0)
MCHC: 33.5 g/dL (ref 30.0–36.0)
MCV: 96.2 fL (ref 80.0–100.0)
Platelets: 141 10*3/uL — ABNORMAL LOW (ref 150–400)
RBC: 2.86 MIL/uL — ABNORMAL LOW (ref 3.87–5.11)
RDW: 13.4 % (ref 11.5–15.5)
WBC: 9.3 10*3/uL (ref 4.0–10.5)
nRBC: 0 % (ref 0.0–0.2)

## 2021-04-10 LAB — BASIC METABOLIC PANEL
Anion gap: 6 (ref 5–15)
BUN: 21 mg/dL (ref 8–23)
CO2: 26 mmol/L (ref 22–32)
Calcium: 8.2 mg/dL — ABNORMAL LOW (ref 8.9–10.3)
Chloride: 107 mmol/L (ref 98–111)
Creatinine, Ser: 1.1 mg/dL — ABNORMAL HIGH (ref 0.44–1.00)
GFR, Estimated: 49 mL/min — ABNORMAL LOW (ref >60)
Glucose, Bld: 98 mg/dL (ref 70–99)
Potassium: 3.2 mmol/L — ABNORMAL LOW (ref 3.5–5.1)
Sodium: 139 mmol/L (ref 135–145)

## 2021-04-10 LAB — TSH: TSH: 1.733 u[IU]/mL (ref 0.350–4.500)

## 2021-04-10 MED ORDER — CEFDINIR 300 MG PO CAPS
300.0000 mg | ORAL_CAPSULE | Freq: Two times a day (BID) | ORAL | Status: DC
  Administered 2021-04-11 – 2021-04-12 (×3): 300 mg via ORAL
  Filled 2021-04-10 (×4): qty 1

## 2021-04-10 MED ORDER — POTASSIUM CHLORIDE 20 MEQ PO PACK
40.0000 meq | PACK | Freq: Two times a day (BID) | ORAL | Status: AC
  Administered 2021-04-10 (×2): 40 meq via ORAL
  Filled 2021-04-10 (×2): qty 2

## 2021-04-10 MED ORDER — FUROSEMIDE 10 MG/ML IJ SOLN
40.0000 mg | Freq: Once | INTRAMUSCULAR | Status: AC
  Administered 2021-04-10: 40 mg via INTRAVENOUS
  Filled 2021-04-10: qty 4

## 2021-04-10 NOTE — Evaluation (Signed)
Occupational Therapy Evaluation Patient Details Name: Regina [name] MRN: [id number] DOB: [birth year] Today's Date: 04/10/2021    History of Present Illness Pt is a 87yo female presenting to [facility] ED on 8/29 with complaints of weakness and dysuria for several days; suspected secondary to UTI. Chest xray negative for acute findings. PMH: CHF, memory loss, HTN, ataxia.   Clinical Impression   Pt. Was cooperative during evaluation. Pt. Has decreased memory and repeatedly asked the same questions. Pt. Is HOH and requires cues to follow directions. Pt. Has decreased B UE strength that impairs preformance with ADLs and mobility. Pt. Has increased pain and refused to sit eob or get oob during session. Acute OT to follow.     Follow Up Recommendations  SNF    Equipment Recommendations  None recommended by OT    Recommendations for Other Services       Precautions / Restrictions Precautions Precautions: Fall Restrictions Weight Bearing Restrictions: No      Mobility Bed Mobility Overal bed mobility: Needs Assistance Bed Mobility: Rolling Rolling: Max assist              Transfers                      Balance                                           ADL either performed or assessed with clinical judgement   ADL Overall ADL's : Needs assistance/impaired Eating/Feeding: Set up;Bed level   Grooming: Wash/dry hands;Wash/dry face;Oral care;Bed level;Minimal assistance   Upper Body Bathing: Moderate assistance;Bed level   Lower Body Bathing: Total assistance;Bed level   Upper Body Dressing : Moderate assistance;Bed level   Lower Body Dressing: Total assistance;Bed level               Functional mobility during ADLs:  (Pt. deferred EOB or OOB secondary to pain.) General ADL Comments: Pt. preformed ADLs bed level secondary to pain.     Vision Baseline Vision/History: 1 Wears glasses Ability to See in Adequate Light: 0 Adequate Patient  Visual Report: No change from baseline Vision Assessment?: No apparent visual deficits     Perception     Praxis      Pertinent Vitals/Pain Pain Assessment: 0-10 Pain Score: 7  Pain Location: b hips and rib cage Pain Descriptors / Indicators: Aching Pain Intervention(s): Premedicated before session;Limited activity within patient's tolerance     Hand Dominance Right   Extremity/Trunk Assessment Upper Extremity Assessment Upper Extremity Assessment: RUE deficits/detail;LUE deficits/detail RUE Deficits / Details: arom 90 and aarom is wnl. pt distal rom is wnl. RUE Sensation: WNL RUE Coordination: decreased fine motor LUE Deficits / Details: arom for shld 60 and aarom 90. distal rom is wnl. LUE Sensation: WNL LUE Coordination: decreased fine motor   Lower Extremity Assessment Lower Extremity Assessment: Defer to PT evaluation   Cervical / Trunk Assessment Cervical / Trunk Assessment: Kyphotic   Communication Communication Communication: HOH   Cognition Arousal/Alertness: Awake/alert Behavior During Therapy: WFL for tasks assessed/performed Overall Cognitive Status: No family/caregiver present to determine baseline cognitive functioning                                 General Comments: knew year but stated month as september.   General Comments  Exercises Exercises: General Upper Extremity General Exercises - Upper Extremity Shoulder Flexion: AAROM;Both;20 reps Shoulder Extension: AAROM;Both;20 reps Shoulder ABduction: AAROM;Both;20 reps Shoulder ADduction: AAROM;Both;20 reps Elbow Flexion: AROM;Both;20 reps Elbow Extension: AROM;Both;20 reps   Shoulder Instructions      Home Living Family/patient expects to be discharged to:: Private residence Living Arrangements: Spouse/significant other;Children Available Help at Discharge: Family;Available 24 hours/day Type of Home: House Home Access: Stairs to enter [employer] of Steps:  2 Entrance Stairs-Rails: None Home Layout: One level     Bathroom Shower/Tub: Occupational psychologist: Handicapped height     Home Equipment: Environmental consultant - 4 wheels;Cane - single point;Shower seat;Grab bars - tub/shower   Additional Comments: Pt. grandson is special needs per pt. she plans on going to a rehab center at [place].      Prior Functioning/Environment Level of Independence: Needs assistance  Gait / Transfers Assistance Needed: Uses cane at home and rollator for community distances. ADL's / Homemaking Assistance Needed: Husband cooks as pt with memory loss, helps with dressing and her medications. Communication / Swallowing Assistance Needed: hoh          OT Problem List: Decreased range of motion;Decreased activity tolerance;Decreased strength;Decreased cognition;Decreased knowledge of use of DME or AE;Pain      OT Treatment/Interventions: Self-care/ADL training;Therapeutic exercise;DME and/or AE instruction;Therapeutic activities;Patient/family education    OT Goals(Current goals can be found in the care plan section) Acute Rehab OT Goals Patient Stated Goal: to get stronger OT Goal Formulation: With patient Potential to Achieve Goals: Good  OT Frequency: Min 2X/week   Barriers to D/C: Decreased caregiver support  Pt. lives with elderly husband and disabled grandson       Co-evaluation              AM-PAC OT "6 Clicks" Daily Activity     Outcome Measure Help from another person eating meals?: A Little Help from another person taking care of personal grooming?: A Little Help from another person toileting, which includes using toliet, bedpan, or urinal?: Total Help from another person bathing (including washing, rinsing, drying)?: Total Help from another person to put on and taking off regular upper body clothing?: A Lot Help from another person to put on and taking off regular lower body clothing?: Total 6 Click Score: 11   End of Session Nurse  Communication:  (ok therapy)  Activity Tolerance: Patient limited by pain Patient left: in bed;with call bell/phone within reach;with bed alarm set  OT Visit Diagnosis: Unsteadiness on feet (R26.81);Other abnormalities of gait and mobility (R26.89)                Time: 1240-1310 OT Time Calculation (min): 30 min Charges:  OT General Charges $OT Visit: 1 Visit OT Evaluation $OT Eval Moderate Complexity: 1 Mod  Mckaela Howley OT/L   Rochell Puett 04/10/2021, 1:20 PM

## 2021-04-10 NOTE — Progress Notes (Signed)
PHARMACY - PHYSICIAN COMMUNICATION CRITICAL VALUE ALERT - BLOOD CULTURE IDENTIFICATION (BCID)  Regina [name] is an 87 y.o. female who presented to [facility] on 04/09/2021 with a chief complaint of weakness/UTI  Assessment:   1/2 blood cultures growing Staphylococcus epidermidis--likely contaminant  Name of physician (or Provider) Contacted:  Dr. Coy Saunas  Current antibiotics:  Rocephin  Changes to prescribed antibiotics recommended:  No changes at this time  Results for orders placed or performed during the hospital encounter of 04/09/21  Blood Culture ID Panel (Reflexed) (Collected: 04/09/2021  6:20 AM)  Result Value Ref Range   Enterococcus faecalis NOT DETECTED NOT DETECTED   Enterococcus Faecium NOT DETECTED NOT DETECTED   Listeria monocytogenes NOT DETECTED NOT DETECTED   Staphylococcus species DETECTED (A) NOT DETECTED   Staphylococcus aureus (BCID) NOT DETECTED NOT DETECTED   Staphylococcus epidermidis DETECTED (A) NOT DETECTED   Staphylococcus lugdunensis NOT DETECTED NOT DETECTED   Streptococcus species NOT DETECTED NOT DETECTED   Streptococcus agalactiae NOT DETECTED NOT DETECTED   Streptococcus pneumoniae NOT DETECTED NOT DETECTED   Streptococcus pyogenes NOT DETECTED NOT DETECTED   A.calcoaceticus-baumannii NOT DETECTED NOT DETECTED   Bacteroides fragilis NOT DETECTED NOT DETECTED   Enterobacterales NOT DETECTED NOT DETECTED   Enterobacter cloacae complex NOT DETECTED NOT DETECTED   Escherichia coli NOT DETECTED NOT DETECTED   Klebsiella aerogenes NOT DETECTED NOT DETECTED   Klebsiella oxytoca NOT DETECTED NOT DETECTED   Klebsiella pneumoniae NOT DETECTED NOT DETECTED   Proteus species NOT DETECTED NOT DETECTED   Salmonella species NOT DETECTED NOT DETECTED   Serratia marcescens NOT DETECTED NOT DETECTED   Haemophilus influenzae NOT DETECTED NOT DETECTED   Neisseria meningitidis NOT DETECTED NOT DETECTED   Pseudomonas aeruginosa NOT DETECTED NOT DETECTED    Stenotrophomonas maltophilia NOT DETECTED NOT DETECTED   Candida albicans NOT DETECTED NOT DETECTED   Candida auris NOT DETECTED NOT DETECTED   Candida glabrata NOT DETECTED NOT DETECTED   Candida krusei NOT DETECTED NOT DETECTED   Candida parapsilosis NOT DETECTED NOT DETECTED   Candida tropicalis NOT DETECTED NOT DETECTED   Cryptococcus neoformans/gattii NOT DETECTED NOT DETECTED   Methicillin resistance mecA/C NOT DETECTED NOT DETECTED    Caryl Pina 04/10/2021  5:25 AM

## 2021-04-10 NOTE — Progress Notes (Signed)
  Date: 04/10/2021  Patient name: Regina [name]  Medical record number: [id number]  Date of birth: [birth year]   I have seen and evaluated Regina [name] and discussed their care with the Residency Team. Briefly, Regina [name] is an 87 year old woman who presented for progressive weakness, significantly worse over last several days.  Also noted subjective fever, chills, dysuria and suprapubic tenderness on exam.  UA consistent with UTI, started on treatment, feels moderately better this morning.  She further notes grief and bereavement after the death of 2 family members in the last 2 months which may be contributing to her decline.   Vitals:   04/10/21 0532 04/10/21 0828  BP: (!) 127/57 (!) 151/53  Pulse: (!) 57 61  Resp: 18 18  Temp: 97.7 F (36.5 C) 97.6 F (36.4 C)  SpO2: 100% 100%   Gen: Elderly woman, thin, lying in bed Eyes; Anicteric sclerae, no injection CV: Mild bradycardia, + systolic murmur, mild pedal edema Pulm: Breathing easily on room air Abd: TTP in the suprapubic region, otherwise flat and with +BS MSK: Decreased bulk, normal tone Skin: Multiple bruises on arms/hands, decreased skin turgor Psych: Flat affect  Assessment and Plan: I have seen and evaluated the patient as outlined above. I agree with the formulated Assessment and Plan as detailed in the residents' note, with the following changes:   1. UTI - UA consistent - UC shows E. Coli, sensitivities pending, BC pending - Continue rocephin today, can transition to oral tomorrow.   2. Weakness - PT/OT recommending SNF, will start that process today  3. Bereavement - Will discuss with patient if she would like to see chaplain or family to support her.   4. Hypokalemia - Replace orally  Other issues per resident team daily note.   Sid Falcon, MD 8/30/202211:21 AM

## 2021-04-10 NOTE — NC FL2 (Signed)
[place] LEVEL OF CARE SCREENING TOOL     IDENTIFICATION  Patient Name: Regina [name] Birthdate: March 08, 1933 Sex: female Admission Date (Current Location): 04/09/2021  [facility] and [place] Number:  Herbalist and Address:  [facility], [place] [address], [place], [place] [address]      Provider Number: [id number]  Attending Physician Name and Address:  Sid Falcon, MD  Relative Name and Phone Number:       Current Level of Care: Hospital Recommended Level of Care: [place] Prior Approval Number:    Date Approved/Denied:   PASRR Number: [id number]  Discharge Plan: SNF    Current Diagnoses: Patient Active Problem List   Diagnosis Date Noted   UTI (urinary tract infection) 04/09/2021   Gait disorder 12/28/2020   Malnutrition of moderate degree 03/10/2018   Acute on chronic diastolic CHF (congestive heart failure) ([place])    Hip pain    Acute cystitis without hematuria    Delirium    Memory loss    Ataxia 03/09/2018   Weakness generalized 03/09/2018   Acute renal failure superimposed on chronic kidney disease ([place]) 03/09/2018   Generalized weakness 03/09/2018   Internal hemorrhoids with complication [id number]   Ulcer of lower limb ([place]) 08/02/2014   Chronic diastolic heart failure ([place]) 08/01/2014   Right bundle branch block 08/01/2014   Essential hypertension 08/01/2014   Hypothyroidism 08/01/2014   Rectal bleeding 07/31/2014   Hypertension 07/31/2014   GERD (gastroesophageal reflux disease) 07/31/2014   Anemia due to other cause 07/31/2014   Lower GI bleeding    Faintness    Syncope 07/30/2014   Varicose veins of lower extremities with other complications [id number]    Orientation RESPIRATION BLADDER Height & Weight     Self, Time, Situation, Place  Normal Incontinent Weight:   Height:     BEHAVIORAL SYMPTOMS/MOOD NEUROLOGICAL BOWEL NUTRITION STATUS      Incontinent Diet (See DC summary)   AMBULATORY STATUS COMMUNICATION OF NEEDS Skin   Extensive Assist Verbally Normal                       Personal Care Assistance Level of Assistance  Bathing, Feeding, Dressing Bathing Assistance: Maximum assistance Feeding assistance: Limited assistance Dressing Assistance: Maximum assistance     Functional Limitations Info             SPECIAL CARE FACTORS FREQUENCY  OT (By licensed OT), PT (By licensed PT)     PT Frequency: 5x a week OT Frequency: 5x a week            Contractures Contractures Info: Not present    Additional Factors Info  Code Status, Allergies Code Status Info: Full Allergies Info: Tape   Penicillins   Latex           Current Medications (04/10/2021):  This is the current hospital active medication list Current Facility-Administered Medications  Medication Dose Route Frequency Provider Last Rate Last Admin   0.9 %  sodium chloride infusion  1,000 mL Intravenous Continuous Cardama, Grayce Sessions, MD 125 mL/hr at 04/10/21 0411 1,000 mL at 04/10/21 0411   acetaminophen (TYLENOL) tablet 650 mg  650 mg Oral Q6H PRN Harvie Heck, MD   650 mg at 04/10/21 [id number]   Or   acetaminophen (TYLENOL) suppository 650 mg  650 mg Rectal Q6H PRN Aslam, Sadia, MD       carbidopa-levodopa (SINEMET IR) 25-100 MG per tablet immediate  release 0.5 tablet  0.5 tablet Oral BID Harvie Heck, MD   0.5 tablet at 04/10/21 0842   carvedilol (COREG) tablet 3.125 mg  3.125 mg Oral BID Harvie Heck, MD   3.125 mg at 04/10/21 0843   [START ON 04/11/2021] cefdinir (OMNICEF) capsule 300 mg  300 mg Oral Q12H Atway, Rayann N, DO       enoxaparin (LOVENOX) injection 30 mg  30 mg Subcutaneous Q24H Aslam, Sadia, MD   30 mg at 04/10/21 0843   furosemide (LASIX) injection 40 mg  40 mg Intravenous Once Virl Axe, MD       levothyroxine (SYNTHROID) tablet 75 mcg  75 mcg Oral QAC breakfast Harvie Heck, MD   75 mcg at 04/10/21 [id number]   potassium chloride (KLOR-CON) packet 40 mEq  40 mEq  Oral BID Atway, Rayann N, DO   40 mEq at 04/10/21 [id number]   senna-docusate (Senokot-S) tablet 1 tablet  1 tablet Oral QHS PRN Aslam, Loralyn [place], MD       sodium chloride flush (NS) 0.9 % injection 3 mL  3 mL Intravenous Q12H Harvie Heck, MD   3 mL at 04/09/21 2200     Discharge Medications: Please see discharge summary for a list of discharge medications.  Relevant Imaging Results:  Relevant Lab Results:   Additional Information SS#: [id number], covid vaccinated, no booster  Emeterio Reeve, LCSW

## 2021-04-10 NOTE — Progress Notes (Addendum)
Subjective:  Regina [name] is an 87 year old female with a pertinent PMH of hypertension, hypothyroidism, HFpEF, colorectal cancer s/p resection, GERD, memory disturbances, and gait disorder concerning for Parkinsonism who presented yesterday with weakness. She had no issues overnight. She slept "like a log" and is doing "better than yesterday". She said she urinated but has continued to feel burning.   Her affect was flat. When she mentioned her deceased son, who passed away 6 months ago, she seemed very sad. She told us, "I think I'm depressed", but she has never been diagnosed. She takes care of her grandson, the son of her deceased son, with her husband. The grandson and her two great-grandchildren bring her joy.  Her grandson has special needs and has medicare. Even still, with the patient and her husband getting older as his primary caretakers, she is concerned about who will care for him in the future. He is 67, and now that he is "becoming a man", the patient's husband takes care of him more and assists with his bathing and took him to school this morning. The patient shared her husband has had prostate cancer since 2015, and she is concerned about that and about what would happen to him if something happened to her.   The patient is the baby of her family and has survived her siblings, but says she does not know how much time she has left. She acknowledges that she has not been perfect but has tried her best and trusts that everything will be alright when her time to die comes.  Objective:  Vital signs in last 24 hours: Vitals:   04/09/21 2235 04/10/21 0038 04/10/21 0532 04/10/21 0828  BP: (!) 124/46 120/68 (!) 127/57 (!) 151/53  Pulse: 67 70 (!) 57 61  Resp: '16 17 18 18  '$ Temp: 98.6 F (37 C) 98.6 F (37 C) 97.7 F (36.5 C) 97.6 F (36.4 C)  TempSrc: Oral Oral Oral Oral  SpO2: 99% 98% 100% 100%   Weight change:   Intake/Output Summary (Last 24 hours) at 04/10/2021 1001 Last data  filed at 04/10/2021 0727 Gross per 24 hour  Intake 2333.05 ml  Output 850 ml  Net 1483.05 ml   Physical Exam Vitals reviewed.  Constitutional:      Appearance: Normal appearance.     Comments: Expressionless face  HENT:     Head: Normocephalic and atraumatic.     Ears:     Comments: Self-reported hard of hearing Pulmonary:     Effort: Pulmonary effort is normal.     Breath sounds: Normal breath sounds.  Abdominal:     General: There is no distension.     Comments: Reported bladder fullness, suprapubic tenderness to palpation  Musculoskeletal:     Comments: Bilateral lower extremity chronic tenderness, 1+ pitting edema More cogwheel rigidity with left elbow flexion and extension in comparison to the right  Skin:    General: Skin is warm and dry.     Comments: Skin irritation of the hand due to tape allergy (tape was holding the IV in place) Bilateral peripheral arm and leg discoloration  Neurological:     General: No focal deficit present.     Mental Status: She is alert and oriented to person, place, and time.     Motor: Weakness present.  Psychiatric:     Comments: Flat affect    Assessment/Plan:  Active Problems:   UTI (urinary tract infection)  Regina [name] is an 87 year old female with a pertinent PMH  of hypertension, hypothyroidism, HFpEF, colorectal cancer s/p resection, GERD, memory disturbances, and gait disorder concerning for Parkinsonism who presented yesterday with weakness and was admitted for a UTI.   Plan: #Weakness #Urinary tract infection Patient has had progressively worsening weakness for several days with urinary symptoms. Her physical exam suprapubic tenderness, urinalysis finding, and mild leukocytosis yesterday indicated UTI, and she received one dose of IV rocephin in the ED. 1/2 blood cultures growing Staphylococcus epidermidis but likely a contaminant. Urine culture preliminarily growing E. Coli She is receiving another dose of IV rocephin  today. - Will transition to PO antibiotics tomorrow pending sensitives  - Follow urine culture sensitivities  - Follow up blood culture - Trend CBC and BMP - Orthostatic vitals if patient can tolerate - PT following to promote independence with functional mobility  - Recommended for SNF on discharge  #HFpEF  #Bilateral lower extremity edema  Patient has a history of chronic diastolic dysfunction for which she takes coreg twice daily and lasix as needed for lower extremity swelling. She notes chronic cellulitis of bilateral lower extremities that is extremely painful. She has 1+ pitting edema of bilateral lower extremities with tenderness to touch.  - Continue carvedilol 3.'125mg'$  twice daily  - Vascular US checking bilateral lower extremities for DVTs, will follow results - If negative, will give one dose of lasix '40mg'$  PO for edema   #Hypothyroidism - Continue Synthroid 77mg daily   #Hx of Parkinson-like symptoms - Continue Sinemet 25-100 0.5 tablet twice daily    Best Practice: Diet: Cardiac diet IVF: NS 125cc/hr  VTE: enoxaparin (LOVENOX) injection 30 mg Start: 04/09/21 1000 Code: Full Status: Inpatient with expected length of stay greater than 2 midnights. Dispo: SNF placement on discharge    LOS: 1 day   SDanie Chandler Medical Student 04/10/2021, 10:01 AM  Attestation for Student Documentation:  I personally was present and performed or re-performed the history, physical exam and medical decision-making activities of this service and have verified that the service and findings are accurately documented in the student's note.  ADorethea Clan DO 04/10/2021, 1:15 PM

## 2021-04-10 NOTE — TOC Initial Note (Signed)
Transition of Care [facility]) - Initial/Assessment Note    Patient Details  Name: Regina [name] MRN: [id number] Date of Birth: Mar 23, 1933  Transition of Care [facility]) CM/SW Contact:    Emeterio Reeve, LCSW Phone Number: 04/10/2021, 2:19 PM  Clinical Narrative:                  CSW received SNF consult. CSW met with pt at bedside. CSW introduced self and explained role at the hospital. Pt reports that PTA Pt lives at home with husband and grandson. Pt reports she uses a cane inside her home and a rollator outside the home. Pt is able to complete most ADL's on her own, sometimes husband has to set with set up.   CSW reviewed PT/OT recommendations for SNF. Pt reports she is fine with SNF, she has been in the past. Pt gave CSW permission to fax out to facilities in the area. Pt prefers [facility]. CSW gave pt medicare.gov rating list to review. CSW explained insurance auth process. Pt reports they are covid vaccinated.  CSW will continue to follow.   Expected Discharge Plan: Skilled Nursing Facility Barriers to Discharge: Continued Medical Work up   Patient Goals and CMS Choice Patient states their goals for this hospitalization and ongoing recovery are:: To get better CMS Medicare.gov Compare Post Acute Care list provided to:: Patient Choice offered to / list presented to : Patient  Expected Discharge Plan and Services Expected Discharge Plan: [place] arrangements for the past 2 months: Single Family Home                                      Prior Living Arrangements/Services Living arrangements for the past 2 months: Single Family Home Lives with:: Spouse Patient language and need for interpreter reviewed:: Yes Do you feel safe going back to the place where you live?: Yes      Need for Family Participation in Patient Care: Yes (Comment) Care giver support system in place?: Yes (comment)   Criminal Activity/Legal Involvement Pertinent to Current  Situation/Hospitalization: No - Comment as needed  Activities of Daily Living      Permission Sought/Granted Permission sought to share information with : Facility Art therapist granted to share information with : Yes, Verbal Permission Granted     Permission granted to share info w AGENCY: SNF        Emotional Assessment Appearance:: Appears stated age Attitude/Demeanor/Rapport: Engaged Affect (typically observed): Appropriate Orientation: : Oriented to Self, Oriented to Place, Oriented to  Time, Oriented to Situation Alcohol / Substance Use: Not Applicable Psych Involvement: No (comment)  Admission diagnosis:  UTI (urinary tract infection) [N39.0] Generalized weakness [R53.1] Urinary tract infection without hematuria, site unspecified [N39.0] Patient Active Problem List   Diagnosis Date Noted   UTI (urinary tract infection) 04/09/2021   Gait disorder 12/28/2020   Malnutrition of moderate degree 03/10/2018   Acute on chronic diastolic CHF (congestive heart failure) ([place])    Hip pain    Acute cystitis without hematuria    Delirium    Memory loss    Ataxia 03/09/2018   Weakness generalized 03/09/2018   Acute renal failure superimposed on chronic kidney disease ([place]) 03/09/2018   Generalized weakness 03/09/2018   Internal hemorrhoids with complication 42/68/3419   Ulcer of lower limb ([place]) 08/02/2014   Chronic diastolic heart failure ([place])  08/01/2014   Right bundle branch block 08/01/2014   Essential hypertension 08/01/2014   Hypothyroidism 08/01/2014   Rectal bleeding 07/31/2014   Hypertension 07/31/2014   GERD (gastroesophageal reflux disease) 07/31/2014   Anemia due to other cause 07/31/2014   Lower GI bleeding    Faintness    Syncope 07/30/2014   Varicose veins of lower extremities with other complications 99/23/4144   PCP:  [name], MD Pharmacy:   [facility] ([facility] [place] [facility] [place] [address] [place]) [place] [address] Phone: [phone] Fax: [phone]     Social Determinants of Health (SDOH) Interventions    Readmission Risk Interventions No flowsheet data found.  Emeterio Reeve, LCSW Clinical Social Worker

## 2021-04-11 DIAGNOSIS — N3000 Acute cystitis without hematuria: Secondary | ICD-10-CM | POA: Diagnosis not present

## 2021-04-11 LAB — CULTURE, BLOOD (ROUTINE X 2): Special Requests: ADEQUATE

## 2021-04-11 LAB — CBC
HCT: 27.7 % — ABNORMAL LOW (ref 36.0–46.0)
Hemoglobin: 9.2 g/dL — ABNORMAL LOW (ref 12.0–15.0)
MCH: 32.4 pg (ref 26.0–34.0)
MCHC: 33.2 g/dL (ref 30.0–36.0)
MCV: 97.5 fL (ref 80.0–100.0)
Platelets: 144 10*3/uL — ABNORMAL LOW (ref 150–400)
RBC: 2.84 MIL/uL — ABNORMAL LOW (ref 3.87–5.11)
RDW: 13.4 % (ref 11.5–15.5)
WBC: 9.5 10*3/uL (ref 4.0–10.5)
nRBC: 0 % (ref 0.0–0.2)

## 2021-04-11 LAB — BASIC METABOLIC PANEL
Anion gap: 6 (ref 5–15)
BUN: 18 mg/dL (ref 8–23)
CO2: 25 mmol/L (ref 22–32)
Calcium: 8.2 mg/dL — ABNORMAL LOW (ref 8.9–10.3)
Chloride: 109 mmol/L (ref 98–111)
Creatinine, Ser: 0.94 mg/dL (ref 0.44–1.00)
GFR, Estimated: 59 mL/min — ABNORMAL LOW (ref >60)
Glucose, Bld: 89 mg/dL (ref 70–99)
Potassium: 3.8 mmol/L (ref 3.5–5.1)
Sodium: 140 mmol/L (ref 135–145)

## 2021-04-11 LAB — URINE CULTURE: Culture: >=100000 — AB

## 2021-04-11 LAB — GLUCOSE, CAPILLARY: Glucose-Capillary: 85 mg/dL (ref 70–99)

## 2021-04-11 LAB — RESP PANEL BY RT-PCR (FLU A&B, COVID) ARPGX2
Influenza A by PCR: NEGATIVE
Influenza B by PCR: NEGATIVE
SARS Coronavirus 2 by RT PCR: NEGATIVE

## 2021-04-11 NOTE — Progress Notes (Addendum)
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 She was doing alright this morning but seemed more confused. Had good urine output yesterday and was able to urinate more frequently after taking the lasix yesterday. She is not sure if she is doing better than yesterday because she has not been able to stand up. She was frustrated that no one had gotten her out of bed and has increased pain in her hips, back, and legs. She has considered using the walker on the wall in her room without supervision. Her husband was present at bedside, and she said if no one came to help her mobilize, she would get him to help.  Objective:  Vital signs in last 24 hours: Vitals:   04/10/21 1701 04/10/21 1955 04/10/21 2344 04/11/21 0417  BP: (!) 141/65 (!) 173/65 (!) 135/55 (!) 151/56  Pulse: 64 66 64 62  Resp: '18 16 18 18  '$ Temp: 98.7 F (37.1 C) 98 F (36.7 C) 97.7 F (36.5 C) 98.3 F (36.8 C)  TempSrc: Oral Oral Oral Oral  SpO2: 93% 100% 97% 96%  Weight:    57.3 kg   Weight change:   Intake/Output Summary (Last 24 hours) at 04/11/2021 1107 Last data filed at 04/11/2021 [id number] Gross per 24 hour  Intake 1825.57 ml  Output 3000 ml  Net -1174.43 ml   Physical Exam Vitals reviewed.  Constitutional:      Appearance: Normal appearance.  HENT:     Head: Normocephalic and atraumatic.     Ears:     Comments: Decreased hearing bilaterally (right worse than left) Pulmonary:     Effort: Pulmonary effort is normal.     Breath sounds: Normal breath sounds.  Musculoskeletal:     Comments: Bilateral lower extremity chronic tenderness, decreased pitting edema  Skin:    Comments: Bilateral peripheral arm and leg discoloration   Neurological:     General: No focal deficit present.     Mental Status: She  is alert and oriented to person, place, and time.     Comments: Confused with some difficulties remembering what happened and who spoke to her yesterday  Psychiatric:     Comments: Frustrated mood    Assessment/Plan:  Principal Problem:   UTI (urinary tract infection) Active Problems:   Hypertension   Hypothyroidism   Generalized weakness   Regina [name] is an 87 year old female with a pertinent PMH of hypertension, hypothyroidism, HFpEF, colorectal cancer s/p resection, GERD, memory disturbances, and gait disorder concerning for Parkinsonism who presented yesterday with weakness and was admitted for a UTI.   Plan: #Weakness #Urinary tract infection Patient has had progressively worsening weakness for several days with symptoms and exam findings indicative of UTI. She received 2 doses of IV rocephin, and started a 3 day course of cefdinir today because the urine culture grew E. coli sensitive to various antibiotics.   - Continue 3 day course of PO cefdinir '300mg'$  (Day 1) - Trend CBC - Orthostatic vitals if patient can tolerate - PT following to promote independence with functional mobility  - Recommended for SNF on discharge   #HFpEF  #Bilateral lower extremity edema  Patient has a history of chronic diastolic dysfunction for which she takes coreg twice daily and lasix as needed for lower extremity swelling. She notes chronic cellulitis of bilateral lower extremities that is  extremely painful. She had 1+ pitting edema of bilateral lower extremities yesterday with tenderness to touch. A dose of lasix '40mg'$  was given yesterday, and she had reduced leg swelling today.  - Continue carvedilol 3.'125mg'$  twice daily   #Confusion Patient has self-reported memory loss, but she seemed more confused today in comparison to yesterday. - Delirium precautions  #Hypothyroidism - Continue Synthroid 67mg daily   #Hx of Parkinson-like symptoms - Continue Sinemet 25-100 0.5 tablet twice daily     Best Practice: Diet: Cardiac diet IVF: NS 125cc/hr  VTE: enoxaparin (LOVENOX) injection 30 mg Start: 04/09/21 1000 Code: Full Status: Inpatient with expected length of stay greater than 2 midnights. Dispo: SNF placement on discharge    LOS: 2 days   SDanie Chandler Medical Student 04/11/2021, 11:07 AM   Attestation for Student Documentation:  I personally was present and performed or re-performed the history, physical exam and medical decision-making activities of this service and have verified that the service and findings are accurately documented in the student's note.  ADorethea Clan DO 04/11/2021, 11:46 AM

## 2021-04-11 NOTE — TOC Progression Note (Signed)
Transition of Care [facility]) - Progression Note    Patient Details  Name: Regina [name] MRN: [id number] Date of Birth: 1932-12-05  Transition of Care [facility]) CM/SW Contact  Emeterio Reeve, [place] Phone Number: 04/11/2021, 12:25 PM  Clinical Narrative:     Pt chose bed at [facility]. CSW confirmed that Snf can accept at discharge. CSW asked MD for new covid test.   Expected Discharge Plan: [place] Barriers to Discharge: Continued Medical Work up  Expected Discharge Plan and Services Expected Discharge Plan: [place] arrangements for the past 2 months: Single Family Home                                       Social Determinants of Health (SDOH) Interventions    Readmission Risk Interventions No flowsheet data found.  Emeterio Reeve, LCSW Clinical Social Worker

## 2021-04-11 NOTE — Progress Notes (Signed)
Physical Therapy Treatment Patient Details Name: Regina [name] MRN: [id number] DOB: Aug 25, 1932 Today's Date: 04/11/2021    History of Present Illness Pt is a 87yo female presenting to [facility] ED on 8/29 with complaints of weakness and dysuria for several days; suspected secondary to UTI. Chest xray negative for acute findings. PMH: CHF, memory loss, HTN, ataxia.    PT Comments    Pt tolerates treatment well with much improved functional mobility quality. Pt demonstrates the ability to perform bed mobility and transfers with limited to no physical assistance, and is able to ambulate with walker for household distances. Pt does continue to demonstrate imbalance without BUE support of walker and will benefit from continued acute PT services to reduce falls risk. PT continues to recommend SNF placement.  Follow Up Recommendations  SNF     Equipment Recommendations  Rolling walker with 5" wheels;3in1 (PT)    Recommendations for Other Services       Precautions / Restrictions Precautions Precautions: Fall Restrictions Weight Bearing Restrictions: No    Mobility  Bed Mobility Overal bed mobility: Needs Assistance Bed Mobility: Supine to Sit;Sit to Supine     Supine to sit: Supervision Sit to supine: Min assist   General bed mobility comments: minA for LEs    Transfers Overall transfer level: Needs assistance Equipment used: Rolling walker (2 wheeled) Transfers: Sit to/from Stand Sit to Stand: Min guard            Ambulation/Gait Ambulation/Gait assistance: Supervision;Min guard Gait Distance (Feet): 300 Feet (additional 17' with railing or IV pole) Assistive device: Rolling walker (2 wheeled) Gait Pattern/deviations: Step-through pattern Gait velocity: reduced Gait velocity interpretation: <1.8 ft/sec, indicate of risk for recurrent falls General Gait Details: pt with slowed step-through gait with reduced stride length   Stairs             Wheelchair Mobility     Modified Rankin (Stroke Patients Only)       Balance Overall balance assessment: Needs assistance Sitting-balance support: No upper extremity supported;Feet supported Sitting balance-Leahy Scale: Fair     Standing balance support: Single extremity supported;Bilateral upper extremity supported Standing balance-Leahy Scale: Poor Standing balance comment: reliant on UE support of walker or railing                            Cognition Arousal/Alertness: Awake/alert Behavior During Therapy: WFL for tasks assessed/performed Overall Cognitive Status: Within Functional Limits for tasks assessed                                        Exercises      General Comments General comments (skin integrity, edema, etc.): VSS on RA      Pertinent Vitals/Pain Pain Assessment: Faces Faces Pain Scale: Hurts even more Pain Location: R hip Pain Descriptors / Indicators: Grimacing Pain Intervention(s): Monitored during session    Home Living                      Prior Function            PT Goals (current goals can now be found in the care plan section) Acute Rehab PT Goals Patient Stated Goal: to get stronger Progress towards PT goals: Progressing toward goals    Frequency    Min 2X/week      PT Plan Discharge plan  needs to be updated    Co-evaluation              AM-PAC PT "6 Clicks" Mobility   Outcome Measure  Help needed turning from your back to your side while in a flat bed without using bedrails?: A Little Help needed moving from lying on your back to sitting on the side of a flat bed without using bedrails?: A Little Help needed moving to and from a bed to a chair (including a wheelchair)?: A Little Help needed standing up from a chair using your arms (e.g., wheelchair or bedside chair)?: A Little Help needed to walk in hospital room?: A Little Help needed climbing 3-5 steps with a railing? : A Lot 6 Click Score: 17     End of Session   Activity Tolerance: Patient tolerated treatment well Patient left: in bed;with call bell/phone within reach;with bed alarm set;with family/visitor present Nurse Communication: Mobility status PT Visit Diagnosis: Pain;Difficulty in walking, not elsewhere classified (R26.2);Muscle weakness (generalized) (M62.81) Pain - Right/Left: Right Pain - part of body: Hip     Time: [id number] PT Time Calculation (min) (ACUTE ONLY): 34 min  Charges:  $Gait Training: 8-22 mins $Therapeutic Activity: 8-22 mins                     Zenaida Niece, PT, DPT Acute Rehabilitation Pager: [phone]    Zenaida Niece 04/11/2021, 2:33 PM

## 2021-04-11 NOTE — Hospital Course (Addendum)
Regina [name] is a 87 y.o. female with a pertinent PMH of hypertension, hypothyroidism, HFpEF, colorectal cancer s/p resection, memory disturbances and gait disorder concerning for Parkinsonism who presented with weakness and admitted for UTI.  #Weakness #Urinary tract infection Patient presented with progressively worsening weakness for several days in the setting of urinary symptoms. She has suprapubic tenderness to palpation on exam. Labs initially significant for mild leukocytosis and urinalysis consistent with UTI. Prior UTI in 2019 with pan sensitive E.faecalis. Patient received one dose of IV rocephin in the ED and was continued on this. Urine cultures grew E. Coli that was pansensitive. Rocephin discontinued on 8/31 and patient was started on cefdinir '300mg'$  BID x3 days. Decreased urinary symptoms 8/31. Increased hip, back, and leg pain on 8/31, likely secondary to immobilization, so PT will help with functional mobility. Orthostatics were negative the afternoon of 8/31. Pain was decreased on 9/1 after ambulation with PT yesterday afternoon. Tomorrow, 9/2, will be the third day of the 3-day course of cefdinir. She will be discharged to [facility].  #HFpEF  #Bilateral lower extremity edema  Patient has a history of chronic diastolic dysfunction for which she takes coreg twice daily and lasix as needed for lower extremity swelling. She notes chronic cellulitis of bilateral lower extremities that is extremely painful. She has 1+ pitting edema of bilateral lower extremities with tenderness to touch. Continued on home carvedilol 3.'125mg'$  BID and was diuresed with one dose of lasix '40mg'$  IV and had ~3L urine output. Pitting edema decreased on 8/31.

## 2021-04-12 DIAGNOSIS — N3000 Acute cystitis without hematuria: Secondary | ICD-10-CM | POA: Diagnosis not present

## 2021-04-12 DIAGNOSIS — R531 Weakness: Secondary | ICD-10-CM | POA: Diagnosis not present

## 2021-04-12 LAB — CBC
HCT: 28.5 % — ABNORMAL LOW (ref 36.0–46.0)
Hemoglobin: 9.4 g/dL — ABNORMAL LOW (ref 12.0–15.0)
MCH: 32.2 pg (ref 26.0–34.0)
MCHC: 33 g/dL (ref 30.0–36.0)
MCV: 97.6 fL (ref 80.0–100.0)
Platelets: 150 10*3/uL (ref 150–400)
RBC: 2.92 MIL/uL — ABNORMAL LOW (ref 3.87–5.11)
RDW: 13.5 % (ref 11.5–15.5)
WBC: 8.2 10*3/uL (ref 4.0–10.5)
nRBC: 0 % (ref 0.0–0.2)

## 2021-04-12 LAB — IRON AND TIBC
Iron: 49 ug/dL (ref 28–170)
Saturation Ratios: 22 % (ref 10.4–31.8)
TIBC: 225 ug/dL — ABNORMAL LOW (ref 250–450)
UIBC: 176 ug/dL

## 2021-04-12 LAB — FERRITIN: Ferritin: 60 ng/mL (ref 11–307)

## 2021-04-12 LAB — GLUCOSE, CAPILLARY: Glucose-Capillary: 88 mg/dL (ref 70–99)

## 2021-04-12 MED ORDER — CARVEDILOL 3.125 MG PO TABS: 3.1250 mg | ORAL_TABLET | Freq: Two times a day (BID) | ORAL | 11 refills | Status: DC

## 2021-04-12 MED ORDER — CEFDINIR 300 MG PO CAPS: 300.0000 mg | ORAL_CAPSULE | Freq: Two times a day (BID) | ORAL | 0 refills | Status: DC

## 2021-04-12 MED ORDER — SENNOSIDES-DOCUSATE SODIUM 8.6-50 MG PO TABS: 1.0000 | ORAL_TABLET | Freq: Every day | ORAL | 1 refills | Status: DC | PRN

## 2021-04-12 NOTE — Progress Notes (Signed)
RN called and gave report to Ashley,RN at [facility]. IV has been removed and pt husband at bedside, Corey Harold has been called and belongings have been packed. AVS in discharge packet

## 2021-04-12 NOTE — TOC Transition Note (Signed)
Transition of Care [facility]) - CM/SW Discharge Note   Patient Details  Name: Regina [name] MRN: [id number] Date of Birth: Aug 25, 1932  Transition of Care [facility]) CM/SW Contact:  Emeterio Reeve, LCSW Phone Number: 04/12/2021, 11:32 AM   Clinical Narrative:     Patient will DC to: [facility] Anticipated DC date: 04/12/21 Family notified: Pt called husband Transport by: Corey Harold     Per MD patient ready for DC to [facility]. RN, patient, patient's family, and facility notified of DC. Discharge Summary and FL2 sent to facility. DC packet on chart. Insurance Josem Kaufmann has been received and pt is covid negative. Ambulance transport requested for patient.    RN to call report to [phone].   CSW will sign off for now as social work intervention is no longer needed. Please consult [place] again if new needs arise.   Final next level of care: Skilled Nursing Facility Barriers to Discharge: Barriers Resolved   Patient Goals and CMS Choice Patient states their goals for this hospitalization and ongoing recovery are:: To get better CMS Medicare.gov Compare Post Acute Care list provided to:: Patient Choice offered to / list presented to : Patient  Discharge Placement              Patient chooses bed at: [facility] Patient to be transferred to facility by: [facility] Name of family member notified: Pt called husband Patient and family notified of of transfer: 04/12/21  Discharge Plan and Services                                     Social Determinants of Health (SDOH) Interventions     Readmission Risk Interventions No flowsheet data found.

## 2021-04-12 NOTE — Progress Notes (Addendum)
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 She had no issues overnight and slept well. She has had no difficulty urinating and has had no pain. She complained of pain in her hips and back yesterday but believes that ambulation with PT yesterday made her feel better.  Objective:  Vital signs in last 24 hours: Vitals:   04/11/21 1239 04/11/21 1844 04/11/21 2353 04/12/21 0441  BP:  (!) 159/55 (!) 153/59 (!) 150/50  Pulse: 63 62 62 60  Resp:  '16 19 19  '$ Temp:  98.6 F (37 C) 98.3 F (36.8 C) 98 F (36.7 C)  TempSrc:  Oral Oral Oral  SpO2:  98% 97% 96%  Weight:    57.3 kg   Weight change: 0 kg  Intake/Output Summary (Last 24 hours) at 04/12/2021 1125 Last data filed at 04/11/2021 1721 Gross per 24 hour  Intake --  Output 900 ml  Net -900 ml   Physical Exam Vitals reviewed.  Constitutional:      General: She is not in acute distress.    Appearance: Normal appearance.  HENT:     Head: Normocephalic and atraumatic.     Ears:     Comments: Decreased hearing bilaterally (right worse than left) Cardiovascular:     Rate and Rhythm: Normal rate and regular rhythm.  Pulmonary:     Effort: Pulmonary effort is normal.     Breath sounds: Normal breath sounds.  Abdominal:     General: Abdomen is flat. There is no distension.  Musculoskeletal:     Comments: Bilateral lower extremity chronic tenderness, decreased pitting edema   Skin:    General: Skin is warm and dry.     Comments: Bilateral peripheral arm and leg discoloration secondary to venous stasis  Neurological:     General: No focal deficit present.     Mental Status: She is alert and oriented to person, place, and time.  Psychiatric:        Mood and Affect: Mood normal.        Behavior: Behavior normal.      Assessment/Plan:  Principal Problem:   UTI (urinary tract infection) Active Problems:   Hypertension   Hypothyroidism   Generalized weakness   Regina [name] is an 87 year old female with a pertinent PMH of hypertension, hypothyroidism, HFpEF, colorectal cancer s/p resection, GERD, memory disturbances, and gait disorder concerning for Parkinsonism who presented two days ago with weakness and was admitted for a UTI.   Plan: #Weakness #Urinary tract infection Patient's UTI being treated with cefdinir is resolving. No reported urinary symptoms. Orthostatics yesterday were negative, and mobility improved after working with PT. - Continue PO cefdinir '300mg'$  to complete a 5 day course  - Discharge to SNF   #HFpEF  #Bilateral lower extremity edema  Patient has a history of chronic diastolic dysfunction for which she takes coreg twice daily and lasix as needed for lower extremity swelling. Legs are still tender because she has cellulitis of bilateral lower extremities, but pitting edema has decreased since admission. - Continue carvedilol 3.'125mg'$  twice daily    #Confusion Patient seemed less confused today.   #Hypothyroidism - Continue Synthroid 57mg daily   #Hx of Parkinson-like symptoms - Continue Sinemet 25-100 0.5 tablet twice daily    Best Practice: Diet: Cardiac diet IVF: NS 125cc/hr discontinued 9/1 VTE: enoxaparin (LOVENOX)  injection 30 mg Start: 04/09/21 1000 Code: Full Status: Inpatient with expected length of stay greater than 2 midnights. Dispo: SNF placement on discharge      LOS: 3 days   Danie Chandler, Medical Student 04/12/2021, 11:25 AM

## 2021-04-12 NOTE — Discharge Summary (Addendum)
Name: Regina [name] MRN: [id number] DOB: 05/04/1933 87 y.o. PCP: [name], MD  Date of Admission: 04/09/2021  5:32 AM Date of Discharge: 04/12/2021 Attending Physician: Velna Ochs, MD  Discharge Diagnosis: 1. E. Coli UTI  2. HFpEF exacerbation   Discharge Medications: Allergies as of 04/12/2021       Reactions   Tape Other (See Comments)   TAPE PULLS OFF THE SKIN, SO PLEASE USE AN ALTERNATIVE   Penicillins Hives, Itching   Latex Rash, Other (See Comments)   NO Band-Aids!!        Medication List     TAKE these medications    acetaminophen 325 MG tablet Commonly known as: TYLENOL Take 325-650 mg by mouth every 6 (six) hours as needed for mild pain or headache.   calcitRIOL 0.25 MCG capsule Commonly known as: ROCALTROL Take 0.25 mcg by mouth daily.   carbidopa-levodopa 25-100 MG tablet Commonly known as: Sinemet Take 0.5 tablets by mouth in the morning and at bedtime.   carvedilol 3.125 MG tablet Commonly known as: Coreg Take 1 tablet (3.125 mg total) by mouth 2 (two) times daily. What changed: Another medication with the same name was removed. Continue taking this medication, and follow the directions you see here.   cefdinir 300 MG capsule Commonly known as: OMNICEF Take 1 capsule (300 mg total) by mouth every 12 (twelve) hours. Take one pill tonight. And then One tab tomorrow AM and one tomorrow PM   furosemide 40 MG tablet Commonly known as: Lasix Please take 40 mg QPM  as needed for volume overload or leg edema What changed:  how much to take how to take this when to take this additional instructions   Myrbetriq 50 MG Tb24 tablet Generic drug: mirabegron ER Take 50 mg by mouth daily.   senna-docusate 8.6-50 MG tablet Commonly known as: Senokot-S Take 1 tablet by mouth daily as needed for mild constipation.   Synthroid 75 MCG tablet Generic drug: levothyroxine Take 75 mcg by mouth daily before breakfast. What changed: Another medication  with the same name was removed. Continue taking this medication, and follow the directions you see here.   Systane Ultra PF 0.4-0.3 % Soln Generic drug: Polyethyl Glyc-Propyl Glyc PF Place 1 drop into both eyes in the morning, at noon, and at bedtime.        Disposition and follow-up:   Regina [name] was discharged from [facility] in Stable condition.  At the hospital follow up visit please address:  1.  Patient discharged to SNF.   2.  Labs / imaging needed at time of follow-up: Repeat CBC   3.  Pending labs/ test needing follow-up: None   Follow-up Appointments: She will need follow up in 1-2 weeks with her PCP.   Hospital Course by problem list: Regina [name] is a 87 y.o. female with a pertinent PMH of hypertension, hypothyroidism, HFpEF, colorectal cancer s/p resection, memory disturbances and gait disorder concerning for Parkinsonism who presented with weakness and admitted for UTI.  #Weakness #Urinary tract infection Patient presented with progressively worsening weakness for several days in the setting of urinary symptoms. She has suprapubic tenderness to palpation on exam. Labs initially significant for mild leukocytosis and urinalysis consistent with UTI. Prior UTI in 2019 with pan sensitive E.faecalis. Patient received one dose of IV rocephin in the ED and was continued on this. Urine cultures grew E. Coli that was pansensitive. Rocephin discontinued on 8/31 and patient was started on cefdinir  $'300mg'g$  BID x3 days. She will continue this on discharge 9/2.   #HFpEF  #Bilateral lower extremity edema  Patient has a history of chronic diastolic dysfunction for which she takes coreg twice daily and lasix as needed for lower extremity swelling. She notes chronic cellulitis of bilateral lower extremities that is extremely painful though this is likely secondary to hypervolemia. She had 1+ pitting edema of bilateral lower extremities with tenderness to touch that  improved with IV diuresis by the time of discharge. Patient will continue on lasix '40mg'$  nightly as needed on discharge for her fluid overload. She will also continue on Coreg 3.'125mg'$  BID.   Discharge Exam:   BP (!) 143/56 (BP Location: Left Arm)   Pulse 61   Temp 97.6 F (36.4 C) (Oral)   Resp 18   Wt 57.3 kg   SpO2 97%   BMI 21.02 kg/m  Discharge exam:    Pertinent Labs, Studies, and Procedures:  CBC CBC Latest Ref Rng & Units 04/12/2021 04/11/2021 04/10/2021  WBC 4.0 - 10.5 K/uL 8.2 9.5 9.3  Hemoglobin 12.0 - 15.0 g/dL 9.4(L) 9.2(L) 9.2(L)  Hematocrit 36.0 - 46.0 % 28.5(L) 27.7(L) 27.5(L)  Platelets 150 - 400 K/uL 150 144(L) 141(L)    BMP Latest Ref Rng & Units 04/11/2021 04/10/2021 04/09/2021  Glucose 70 - 99 mg/dL 89 98 121(H)  BUN 8 - 23 mg/dL '18 21 21  '$ Creatinine 0.44 - 1.00 mg/dL 0.94 1.10(H) 1.30(H)  BUN/Creat Ratio 12 - 28 - - -  Sodium 135 - 145 mmol/L 140 139 140  Potassium 3.5 - 5.1 mmol/L 3.8 3.2(L) 3.7  Chloride 98 - 111 mmol/L 109 107 101  CO2 22 - 32 mmol/L 25 26 -  Calcium 8.9 - 10.3 mg/dL 8.2(L) 8.2(L) -  Urinalysis    Component Value Date/Time   COLORURINE YELLOW 04/09/2021 0538   APPEARANCEUR CLOUDY (A) 04/09/2021 0538   APPEARANCEUR Clear 04/22/2018 1608   LABSPEC 1.008 04/09/2021 0538   PHURINE 7.0 04/09/2021 0538   GLUCOSEU NEGATIVE 04/09/2021 0538   HGBUR SMALL (A) 04/09/2021 0538   BILIRUBINUR NEGATIVE 04/09/2021 0538   BILIRUBINUR Negative 04/22/2018 1608   KETONESUR 5 (A) 04/09/2021 0538   PROTEINUR 30 (A) 04/09/2021 0538   UROBILINOGEN 0.2 07/30/2014 2003   NITRITE NEGATIVE 04/09/2021 0538   LEUKOCYTESUR LARGE (A) 04/09/2021 0538    urine culture E. Coli   Escherichia coli    MIC    AMPICILLIN 4 SENSITIVE  Sensitive    AMPICILLIN/SULBACTAM <=2 SENSITIVE  Sensitive    CEFAZOLIN <=4 SENSITIVE  Sensitive    CEFEPIME <=0.12 SENS... Sensitive    CEFTRIAXONE <=0.25 SENS... Sensitive    CIPROFLOXACIN <=0.25 SENS... Sensitive    GENTAMICIN  <=1 SENSITIVE  Sensitive    IMIPENEM <=0.25 SENS... Sensitive    NITROFURANTOIN <=16 SENSIT... Sensitive    PIP/TAZO <=4 SENSITIVE  Sensitive    TRIMETH/SULFA <=20 SENSIT... Sensitive              Signed: Rick Duff, MD 04/12/2021, 1:04 PM   Pager: [phone]

## 2021-04-14 LAB — CULTURE, BLOOD (ROUTINE X 2)
Culture: NO GROWTH
Special Requests: ADEQUATE

## 2021-05-03 NOTE — Patient Instructions (Signed)
Below is our plan:  We will increase Sinemet to 1/2 tablet three time daily. Please let me know if you have any unwanted side effects. Consider talking with PCP if any concerns of depression are present. Please consider using Tylenol 500-100mg  as needed for hip pain prior to PT appt.   Please make sure you are staying well hydrated. I recommend 50-60 ounces daily. Well balanced diet and regular exercise encouraged. Consistent sleep schedule with 6-8 hours recommended.   Please continue follow up with care team as directed.   Follow up with Regina [name] in 3-4 months   You may receive a survey regarding today's visit. I encourage you to leave honest feed back as I do use this information to improve patient care. Thank you for seeing me today!

## 2021-05-03 NOTE — Progress Notes (Signed)
Chief Complaint  Patient presents with   Follow-up    Pt with husband rm 27. Presents for follow up visit. She just was discharged from hospital[facility] from having a UTI.  Getting therapy at home starting this week.      HISTORY OF PRESENT ILLNESS:  05/07/21 ALL:  Regina [name] is a 87 y.o. female here today for follow up for memory disturbance and gait disorder concerning for parkinsonism. She presents with her husband who aids in history. [name] started her on Sinemet 25-100 1/2 tablet twice daily at last follow up 12/2020. Mr Mixon does not feel that it has made any significant difference in her movements. He does feel that hand tremor has improved. She seems to be tolerating medication well.   She was hospitalized for a UTI on 8/29. She was discharged to [place] 9/1 and was discharged on 9/15. She started PT/OT last week. She has a Therapist, sports coming to check on her weekly. She has not had any falls since last being seen. She reports significant hip pain, bilaterally. Her husband feels this is a significant contributor to her not being active. She refuses to take pain medication for pain. Mr Salatino reports that she is sleeping more than normal. She has a good appetite. She denies depression.   12/28/20 SS: Regina [name] is an 87 year old female with history of memory issues.  MRI of the brain in December 2021 showed atrophy affecting temporal lobes could be consistent with memory disorder.  Mild small vessel disease, no change from 2019.  She has some Parkinson features, tried 1/2 of Sinemet resulted in dizziness, she stopped it. Memory isn't all that bad, gets confused about things. Is scared to take a shower by herself, has no confidence. No fall since last seen. They adopted their 4 year grandson, has handicap issues. She worries about everything. Is not active, mostly sit around. She does her own laundry. Sleeps well, good appetite. Moves slow, has muscle soreness all over.  Here today for evaluation  accompanied by her husband.   HISTORY  07/20/2020 Dr. Jannifer Franklin: Regina [name] is an 87 year old right-handed white female with a history of some memory issues that occurred since she had a presumed viral meningitis in the summer 2019.  The patient returns the office today.  She is sleeping fairly well, there is some report from her husband that her confusion seems to be getting slowly worse over time.  The patient is no longer cooking, she will do housework, she does not operate a motor vehicle.  She needs assistance keeping up with medications and appointments.  She does report some cramping of her legs at nighttime.  She is hard of hearing and she reports that she is cold all the time.  She has leg swelling as well.  She returns to the office today for an evaluation.   REVIEW OF SYSTEMS: Out of a complete 14 system review of symptoms, the patient complains only of the following symptoms, memory loss, tremor, slow movements, hip pain, and all other reviewed systems are negative.   ALLERGIES: Allergies  Allergen Reactions   Tape Other (See Comments)    TAPE PULLS OFF THE SKIN, SO PLEASE USE AN ALTERNATIVE   Penicillins Hives and Itching   Latex Rash and Other (See Comments)    NO Band-Aids!!     HOME MEDICATIONS: Outpatient Medications Prior to Visit  Medication Sig Dispense Refill   calcitRIOL (ROCALTROL) 0.25 MCG capsule Take 0.25 mcg by mouth daily.  carvedilol (COREG) 3.125 MG tablet Take 1 tablet (3.125 mg total) by mouth 2 (two) times daily. 60 tablet 11   furosemide (LASIX) 40 MG tablet Please take 40 mg QPM  as needed for volume overload or leg edema (Patient taking differently: Take 40 mg by mouth in the morning.) 30 tablet 11   MYRBETRIQ 50 MG TB24 tablet Take 50 mg by mouth daily.     senna-docusate (SENOKOT-S) 8.6-50 MG tablet Take 1 tablet by mouth daily as needed for mild constipation. 30 tablet 1   SYNTHROID 75 MCG tablet Take 75 mcg by mouth daily before breakfast.     SYSTANE  ULTRA PF 0.4-0.3 % SOLN Place 1 drop into both eyes in the morning, at noon, and at bedtime.     carbidopa-levodopa (SINEMET) 25-100 MG tablet Take 0.5 tablets by mouth in the morning and at bedtime. 30 tablet 6   cefdinir (OMNICEF) 300 MG capsule Take 1 capsule (300 mg total) by mouth every 12 (twelve) hours. Take one pill tonight. And then One tab tomorrow AM and one tomorrow PM 3 capsule 0   acetaminophen (TYLENOL) 325 MG tablet Take 325-650 mg by mouth every 6 (six) hours as needed for mild pain or headache. (Patient not taking: Reported on 05/07/2021)     No facility-administered medications prior to visit.     PAST MEDICAL HISTORY: Past Medical History:  Diagnosis Date   Allergy    Barrett esophagus 2010   Cancer ([place])    GERD (gastroesophageal reflux disease)    Hypertension    Thyroid disease      PAST SURGICAL HISTORY: Past Surgical History:  Procedure Laterality Date   APPENDECTOMY     COLON SURGERY     COLONOSCOPY     COLONOSCOPY N/A 08/03/2014   Procedure: COLONOSCOPY;  Surgeon: Inda Castle, MD;  Location: [place];  Service: Endoscopy;  Laterality: N/A;   POLYPECTOMY     TONSILLECTOMY     TOTAL THYROIDECTOMY     TUBAL LIGATION       FAMILY HISTORY: Family History  Problem Relation Age of Onset   Heart disease Mother    Heart disease Father    Diabetes Sister    COPD Brother    Diabetes Brother      SOCIAL HISTORY: Social History   Socioeconomic History   Marital status: Married    Spouse name: Jeneen Rinks   Number of children: Not on file   Years of education: Not on file   Highest education level: Not on file  Occupational History   Not on file  Tobacco Use   Smoking status: Never   Smokeless tobacco: Never  Substance and Sexual Activity   Alcohol use: No   Drug use: No   Sexual activity: Not Currently  Other Topics Concern   Not on file  Social History Narrative   Lives with husband and son   Right Handed   Drinks 1-2 cups caffeine  daily   Social Determinants of Health   Financial Resource Strain: Not on file  Food Insecurity: Not on file  Transportation Needs: Not on file  Physical Activity: Not on file  Stress: Not on file  Social Connections: Not on file  Intimate Partner Violence: Not on file     PHYSICAL EXAM  Vitals:   05/07/21 1036  BP: (!) 161/62  Pulse: (!) 59  Weight: 125 lb (56.7 kg)   Body mass index is 20.8 kg/m.  Generalized: Well developed, in no  acute distress, flat affect   Cardiology: normal rate and rhythm, no murmur auscultated  Respiratory: clear to auscultation bilaterally    Neurological examination  Mentation: Alert oriented to time, place, and some history taking. Follows all commands speech and language fluent Cranial nerve II-XII: Pupils were equal round reactive to light. Extraocular movements were full, visual field were full on confrontational test. Facial sensation and strength were normal. Uvula tongue midline. Head turning and shoulder shrug  were normal and symmetric. Motor: The motor testing reveals 4-4+ over 5 strength of all 4 extremities. Generalized weakness. No tremor noted. Bradykinesia noted.  Sensory: Sensory testing is intact to soft touch on all 4 extremities. No evidence of extinction is noted.  Coordination: Cerebellar testing reveals good finger-nose-finger and heel-to-shin bilaterally.  Gait and station: Able to push to standing position. Slightly stooped posture. Gait is slow and cautious, right limp, using four prong cane. Tandem not attempted.  Reflexes: Deep tendon reflexes are symmetric and normal bilaterally.    DIAGNOSTIC DATA (LABS, IMAGING, TESTING) - I reviewed patient records, labs, notes, testing and imaging myself where available.  Lab Results  Component Value Date   WBC 8.2 04/12/2021   HGB 9.4 (L) 04/12/2021   HCT 28.5 (L) 04/12/2021   MCV 97.6 04/12/2021   PLT 150 04/12/2021      Component Value Date/Time   NA 140 04/11/2021  0338   NA 137 03/18/2018 1048   K 3.8 04/11/2021 0338   CL 109 04/11/2021 0338   CO2 25 04/11/2021 0338   GLUCOSE 89 04/11/2021 0338   BUN 18 04/11/2021 0338   BUN 20 03/18/2018 1048   CREATININE 0.94 04/11/2021 0338   CALCIUM 8.2 (L) 04/11/2021 0338   PROT 6.0 (L) 04/09/2021 0620   PROT 5.8 (L) 03/18/2018 1048   ALBUMIN 3.0 (L) 04/09/2021 0620   ALBUMIN 3.4 (L) 03/18/2018 1048   AST 19 04/09/2021 0620   ALT <5 04/09/2021 0620   ALKPHOS 57 04/09/2021 0620   BILITOT 1.3 (H) 04/09/2021 0620   BILITOT 0.3 03/18/2018 1048   GFRNONAA 59 (L) 04/11/2021 0338   GFRAA 49 (L) 04/22/2020 1342   No results found for: CHOL, HDL, LDLCALC, LDLDIRECT, TRIG, CHOLHDL No results found for: HGBA1C Lab Results  Component Value Date   VITAMINB12 422 07/20/2020   Lab Results  Component Value Date   TSH 1.733 04/10/2021    MMSE - Mini Mental State Exam 05/07/2021 12/28/2020 07/20/2020  Not completed: - - -  Orientation to time 4 5 4   Orientation to Place 5 5 5   Registration 3 3 3   Attention/ Calculation 5 1 2   Recall 3 3 3   Language- name 2 objects 1 2 2   Language- repeat 0 1 1  Language- follow 3 step command 2 3 3   Language- read & follow direction 0 1 1  Write a sentence 0 0 1  Copy design 0 1 1  Total score 23 25 26      No flowsheet data found.   ASSESSMENT AND PLAN  87 y.o. year old female  has a past medical history of Allergy, Barrett esophagus (2010), Cancer ([place]), GERD (gastroesophageal reflux disease), Hypertension, and Thyroid disease. here with    Gait disorder  Memory loss   Regina [name] returns for evaluation following initation of generic Sinemet 25-100mg  1/2 tablet BID. There has been no significant improvement, however, she was recently hospitalized for UTI requiring SNF for additional rehab. Mr Fouche does feel that she has tolerated Sinemet well  and tremor does seem improved. I will have her increase dose to 1/2 tbalet TID. I have encouraged her to consider Tylenol  500-1000mg  30 minutes prior to PT sessions to aid in pain management. She was encouraged to continue using her walker at all times. I have given Mr Hum information to read and asked him to monitor for any concerns of depression as she seems less engaged in conversation and is sleeping more at home. Memory compensation strategies and fall precautions reviewed. I will have her return to see [name] in 3-4 months. She and Mr Remley verbalize understanding and agreement with this plan.   No orders of the defined types were placed in this encounter.    Meds ordered this encounter  Medications   carbidopa-levodopa (SINEMET) 25-100 MG tablet    Sig: Take 0.5 tablets by mouth 3 (three) times daily.    Dispense:  45 tablet    Refill:  5    Order Specific Question:   Supervising Provider    Answer:   Melvenia Beam [[id number]]       Debbora Presto, MSN, FNP-C 05/07/2021, 12:43 PM  [facility] [address] [place], [place] [address] [phone]

## 2021-05-07 ENCOUNTER — Ambulatory Visit (INDEPENDENT_AMBULATORY_CARE_PROVIDER_SITE_OTHER): Payer: Medicare Other | Admitting: Family Medicine

## 2021-05-07 ENCOUNTER — Encounter: Payer: Self-pay | Admitting: Family Medicine

## 2021-05-07 VITALS — BP 161/62 | HR 59 | Wt 125.0 lb

## 2021-05-07 DIAGNOSIS — R413 Other amnesia: Secondary | ICD-10-CM

## 2021-05-07 DIAGNOSIS — R269 Unspecified abnormalities of gait and mobility: Secondary | ICD-10-CM

## 2021-05-07 MED ORDER — CARBIDOPA-LEVODOPA 25-100 MG PO TABS: 0.5000 | ORAL_TABLET | Freq: Three times a day (TID) | ORAL | 5 refills | Status: DC

## 2021-05-07 NOTE — Progress Notes (Signed)
I have read the note, and I agree with the clinical assessment and plan.  Skanda Worlds K Talayah Picardi   

## 2021-08-23 ENCOUNTER — Encounter: Payer: Self-pay | Admitting: Sports Medicine

## 2021-08-23 ENCOUNTER — Ambulatory Visit (INDEPENDENT_AMBULATORY_CARE_PROVIDER_SITE_OTHER): Payer: Medicare Other | Admitting: Sports Medicine

## 2021-08-23 ENCOUNTER — Other Ambulatory Visit: Payer: Self-pay

## 2021-08-23 DIAGNOSIS — I739 Peripheral vascular disease, unspecified: Secondary | ICD-10-CM

## 2021-08-23 DIAGNOSIS — B351 Tinea unguium: Secondary | ICD-10-CM

## 2021-08-23 DIAGNOSIS — R413 Other amnesia: Secondary | ICD-10-CM

## 2021-08-23 DIAGNOSIS — M79609 Pain in unspecified limb: Secondary | ICD-10-CM

## 2021-08-23 DIAGNOSIS — S90415A Abrasion, left lesser toe(s), initial encounter: Secondary | ICD-10-CM | POA: Diagnosis not present

## 2021-08-23 NOTE — Progress Notes (Signed)
Subjective: Regina [name] is a 88 y.o. female patient seen today in office with complaint of mildly painful thickened and elongated toenails; unable to trim. Patient is assisted by husband who reports that she tried to trim her own nails and clipped the skin on her left second toe over a week ago seems to be doing okay.  Patient and husband are here for nail care today husband reports that the wife nails are so thick and he has arthritis so he can no longer help her with them.  Denies known history of diabetes.  Denies known history of neuropathy.  No other pedal complaints noted.  Patient Active Problem List   Diagnosis Date Noted   UTI (urinary tract infection) 04/09/2021   Gait disorder 12/28/2020   Malnutrition of moderate degree 03/10/2018   Acute on chronic diastolic CHF (congestive heart failure) (HCC)    Hip pain    Acute cystitis without hematuria    Delirium    Memory loss    Ataxia 03/09/2018   Weakness generalized 03/09/2018   Acute renal failure superimposed on chronic kidney disease ([place]) 03/09/2018   Generalized weakness 03/09/2018   Internal hemorrhoids with complication 16/02/3709   Ulcer of lower limb ([place]) 08/02/2014   Chronic diastolic heart failure ([place]) 08/01/2014   Right bundle branch block 08/01/2014   Essential hypertension 08/01/2014   Hypothyroidism 08/01/2014   Rectal bleeding 07/31/2014   Hypertension 07/31/2014   GERD (gastroesophageal reflux disease) 07/31/2014   Anemia due to other cause 07/31/2014   Lower GI bleeding    Faintness    Syncope 07/30/2014   Varicose veins of lower extremities with other complications 62/69/4854    Current Outpatient Medications on File Prior to Visit  Medication Sig Dispense Refill   acetaminophen (TYLENOL) 325 MG tablet Take 325-650 mg by mouth every 6 (six) hours as needed for mild pain or headache. (Patient not taking: Reported on 05/07/2021)     calcitRIOL (ROCALTROL) 0.25 MCG capsule Take 0.25 mcg by mouth daily.      carbidopa-levodopa (SINEMET) 25-100 MG tablet Take 0.5 tablets by mouth 3 (three) times daily. 45 tablet 5   carvedilol (COREG) 3.125 MG tablet Take 1 tablet (3.125 mg total) by mouth 2 (two) times daily. 60 tablet 11   furosemide (LASIX) 40 MG tablet Please take 40 mg QPM  as needed for volume overload or leg edema (Patient taking differently: Take 40 mg by mouth in the morning.) 30 tablet 11   MYRBETRIQ 50 MG TB24 tablet Take 50 mg by mouth daily.     senna-docusate (SENOKOT-S) 8.6-50 MG tablet Take 1 tablet by mouth daily as needed for mild constipation. 30 tablet 1   SYNTHROID 75 MCG tablet Take 75 mcg by mouth daily before breakfast.     SYSTANE ULTRA PF 0.4-0.3 % SOLN Place 1 drop into both eyes in the morning, at noon, and at bedtime.     No current facility-administered medications on file prior to visit.    Allergies  Allergen Reactions   Tape Other (See Comments)    TAPE PULLS OFF THE SKIN, SO PLEASE USE AN ALTERNATIVE   Penicillins Hives and Itching   Latex Rash and Other (See Comments)    NO Band-Aids!!    Objective: Physical Exam  General: Well developed, nourished, no acute distress, awake, alert and oriented x 3  Vascular: Dorsalis pedis artery 1/4 bilateral, Posterior tibial artery 0/4 bilateral, skin temperature warm to cool proximal to distal bilateral lower extremities, moderate  varicosities, no pedal hair present bilateral.  Neurological: Gross sensation present via light touch bilateral.  Semmes Weinstein monofilament intact bilateral.  Dermatological: Skin is warm, dry, and supple bilateral, Nails 1-10 are tender, long, thick, and discolored with mild subungal debris, no webspace macerations present bilateral, superficial abrasion noted to the left second toe with no surrounding signs of infection, no callus/corns/hyperkeratotic tissue present bilateral. No signs of infection bilateral.  Musculoskeletal: No symptomatic boney deformities noted bilateral.  Muscular strength within normal limits without painon range of motion. No pain with calf compression bilateral.  Assessment and Plan:  Problem List Items Addressed This Visit       Other   Memory loss   Other Visit Diagnoses     Pain due to onychomycosis of nail    -  Primary   PVD (peripheral vascular disease) ([place])       Abrasion of second toe of left foot, initial encounter           -Examined patient.  -Discussed treatment options for painful mycotic nails. -Mechanically debrided and reduced painful mycotic nails with sterile nail nipper and dremel nail file without incident. -Advised patient to refrain from cutting nails herself and to monitor abrasion at left second toe to apply Neosporin if worsens return to office sooner -Patient to return in 3 months for follow up evaluation or sooner if symptoms worsen.  Landis Martins, DPM

## 2021-09-25 ENCOUNTER — Ambulatory Visit (INDEPENDENT_AMBULATORY_CARE_PROVIDER_SITE_OTHER): Payer: Medicare Other | Admitting: Neurology

## 2021-09-25 ENCOUNTER — Encounter: Payer: Self-pay | Admitting: Neurology

## 2021-09-25 VITALS — BP 140/49 | HR 49 | Ht 65.0 in | Wt 125.0 lb

## 2021-09-25 DIAGNOSIS — R269 Unspecified abnormalities of gait and mobility: Secondary | ICD-10-CM | POA: Diagnosis not present

## 2021-09-25 MED ORDER — CARBIDOPA-LEVODOPA 25-100 MG PO TABS: 0.5000 | ORAL_TABLET | Freq: Three times a day (TID) | ORAL | 5 refills | Status: DC

## 2021-09-25 NOTE — Progress Notes (Signed)
Chief Complaint  Patient presents with   Follow-up    RM 50, with husband. Husband reports memory has declined.    HISTORY OF PRESENT ILLNESS: Today 09/25/21 SS:  Regina [name] is here today with her husband for follow-up. Memory issues since suspected viral meningitis in summer 2019. MMSE 17/30. Memory has declined, isn't able to do much for herself. Needs help with ADLs. Needs helping using the bathroom. 1 fall after tripping over cane, skinned her arm. Using walker. Takes 1/2 tablet twice daily of Sinemet, hasn't noted much change with use. Had bad hips that make it hard for her to move around. She is in PT/OT now, not any change. Is hard of hearing. Her mood can be poor at times. Sleeps well. Can be obsessed with trash on the floor. In PT/OT not seeing any benefit.   09/25/21 ALL:  Regina [name] is a 88 y.o. female here today for follow up for memory disturbance and gait disorder concerning for parkinsonism. She presents with her husband who aids in history. Regina [name] started her on Sinemet 25-100 1/2 tablet twice daily at last follow up 12/2020. Regina [name] does not feel that it has made any significant difference in her movements. He does feel that hand tremor has improved. She seems to be tolerating medication well.   She was hospitalized for a UTI on 8/29. She was discharged to [facility] 9/1 and was discharged on 9/15. She started PT/OT last week. She has a Therapist, sports coming to check on her weekly. She has not had any falls since last being seen. She reports significant hip pain, bilaterally. Her husband feels this is a significant contributor to her not being active. She refuses to take pain medication for pain. Regina [name] reports that she is sleeping more than normal. She has a good appetite. She denies depression.   12/28/20 SS: Regina [name] is an 88 year old female with history of memory issues.  MRI of the brain in December 2021 showed atrophy affecting temporal lobes could be consistent with memory disorder.  Mild  small vessel disease, no change from 2019.  She has some Parkinson features, tried 1/2 of Sinemet resulted in dizziness, she stopped it. Memory isn't all that bad, gets confused about things. Is scared to take a shower by herself, has no confidence. No fall since last seen. They adopted their 11 year grandson, has handicap issues. She worries about everything. Is not active, mostly sit around. She does her own laundry. Sleeps well, good appetite. Moves slow, has muscle soreness all over.  Here today for evaluation accompanied by her husband.   HISTORY  07/20/2020 Dr. Jannifer Franklin: Regina [name] is an 88 year old right-handed white female with a history of some memory issues that occurred since she had a presumed viral meningitis in the summer 2019.  The patient returns the office today.  She is sleeping fairly well, there is some report from her husband that her confusion seems to be getting slowly worse over time.  The patient is no longer cooking, she will do housework, she does not operate a motor vehicle.  She needs assistance keeping up with medications and appointments.  She does report some cramping of her legs at nighttime.  She is hard of hearing and she reports that she is cold all the time.  She has leg swelling as well.  She returns to the office today for an evaluation.   REVIEW OF SYSTEMS: Out of a complete 14 system review of symptoms, the patient complains  only of the following symptoms, memory loss, tremor, slow movements, hip pain, and all other reviewed systems are negative.   ALLERGIES: Allergies  Allergen Reactions   Tape Other (See Comments)    TAPE PULLS OFF THE SKIN, SO PLEASE USE AN ALTERNATIVE   Penicillins Hives and Itching   Latex Rash and Other (See Comments)    NO Band-Aids!!     HOME MEDICATIONS: Outpatient Medications Prior to Visit  Medication Sig Dispense Refill   acetaminophen (TYLENOL) 325 MG tablet Take 325-650 mg by mouth every 6 (six) hours as needed for mild pain or  headache.     calcitRIOL (ROCALTROL) 0.25 MCG capsule Take 0.25 mcg by mouth daily.     carvedilol (COREG) 3.125 MG tablet Take 1 tablet (3.125 mg total) by mouth 2 (two) times daily. 60 tablet 11   furosemide (LASIX) 40 MG tablet Please take 40 mg QPM  as needed for volume overload or leg edema (Patient taking differently: Take 40 mg by mouth in the morning.) 30 tablet 11   MYRBETRIQ 50 MG TB24 tablet Take 50 mg by mouth daily.     senna-docusate (SENOKOT-S) 8.6-50 MG tablet Take 1 tablet by mouth daily as needed for mild constipation. 30 tablet 1   SYNTHROID 75 MCG tablet Take 75 mcg by mouth daily before breakfast.     SYSTANE ULTRA PF 0.4-0.3 % SOLN Place 1 drop into both eyes in the morning, at noon, and at bedtime.     carbidopa-levodopa (SINEMET) 25-100 MG tablet Take 0.5 tablets by mouth 3 (three) times daily. 45 tablet 5   No facility-administered medications prior to visit.     PAST MEDICAL HISTORY: Past Medical History:  Diagnosis Date   Allergy    Barrett esophagus 2010   Cancer ([place])    GERD (gastroesophageal reflux disease)    Hypertension    Thyroid disease      PAST SURGICAL HISTORY: Past Surgical History:  Procedure Laterality Date   APPENDECTOMY     COLON SURGERY     COLONOSCOPY     COLONOSCOPY N/A 08/03/2014   Procedure: COLONOSCOPY;  Surgeon: Inda Castle, MD;  Location: [facility];  Service: Endoscopy;  Laterality: N/A;   POLYPECTOMY     TONSILLECTOMY     TOTAL THYROIDECTOMY     TUBAL LIGATION       FAMILY HISTORY: Family History  Problem Relation Age of Onset   Heart disease Mother    Heart disease Father    Diabetes Sister    COPD Brother    Diabetes Brother      SOCIAL HISTORY: Social History   Socioeconomic History   Marital status: Married    Spouse name: Regina [name]   Number of children: Not on file   Years of education: Not on file   Highest education level: Not on file  Occupational History   Not on file  Tobacco Use    Smoking status: Never   Smokeless tobacco: Never  Substance and Sexual Activity   Alcohol use: No   Drug use: No   Sexual activity: Not Currently  Other Topics Concern   Not on file  Social History Narrative   Lives with husband and son   Right Handed   Drinks 1-2 cups caffeine daily   Social Determinants of Health   Financial Resource Strain: Not on file  Food Insecurity: Not on file  Transportation Needs: Not on file  Physical Activity: Not on file  Stress: Not on file  Social Connections: Not on file  Intimate Partner Violence: Not on file     PHYSICAL EXAM  Vitals:   09/25/21 1119  BP: (!) 140/49  Pulse: (!) 49  Weight: 125 lb (56.7 kg)  Height: 5\' 5"  (1.651 m)    Body mass index is 20.8 kg/m.  Generalized: Well developed, in no acute distress Neurological examination  Mentation: Alert, oriented, history is provided by husband, flat affect, masking of face, not participatory  Cranial nerve II-XII: Pupils were equal round reactive to light. Extraocular movements were full, visual field were full on confrontational test. Facial sensation and strength were normal.  Head turning and shoulder shrug  were normal and symmetric. Motor: Strength is overall intact all extremities, there is no rigidity noted, bradykinesia noted in upper/lower extremities with finger/toe taps Sensory: Sensory testing is intact to soft touch on all 4 extremities. No evidence of extinction is noted.  Coordination: able to perform but there is some apraxia Gait and station: Has to push off from seated position to stand, rises slowly, gait is wide-based, unsteady, shuffling, forward leaning, knees are flexed  Reflexes: Deep tendon reflexes are symmetric but decreased  DIAGNOSTIC DATA (LABS, IMAGING, TESTING) - I reviewed patient records, labs, notes, testing and imaging myself where available.  Lab Results  Component Value Date   WBC 8.2 04/12/2021   HGB 9.4 (L) 04/12/2021   HCT 28.5 (L)  04/12/2021   MCV 97.6 04/12/2021   PLT 150 04/12/2021      Component Value Date/Time   NA 140 04/11/2021 0338   NA 137 03/18/2018 1048   K 3.8 04/11/2021 0338   CL 109 04/11/2021 0338   CO2 25 04/11/2021 0338   GLUCOSE 89 04/11/2021 0338   BUN 18 04/11/2021 0338   BUN 20 03/18/2018 1048   CREATININE 0.94 04/11/2021 0338   CALCIUM 8.2 (L) 04/11/2021 0338   PROT 6.0 (L) 04/09/2021 0620   PROT 5.8 (L) 03/18/2018 1048   ALBUMIN 3.0 (L) 04/09/2021 0620   ALBUMIN 3.4 (L) 03/18/2018 1048   AST 19 04/09/2021 0620   ALT <5 04/09/2021 0620   ALKPHOS 57 04/09/2021 0620   BILITOT 1.3 (H) 04/09/2021 0620   BILITOT 0.3 03/18/2018 1048   GFRNONAA 59 (L) 04/11/2021 0338   GFRAA 49 (L) 04/22/2020 1342   No results found for: CHOL, HDL, LDLCALC, LDLDIRECT, TRIG, CHOLHDL No results found for: HGBA1C Lab Results  Component Value Date   VITAMINB12 422 07/20/2020   Lab Results  Component Value Date   TSH 1.733 04/10/2021    MMSE - Mini Mental State Exam 09/25/2021 05/07/2021 12/28/2020  Not completed: - - -  Orientation to time 1 4 5   Orientation to Place 4 5 5   Registration 3 3 3   Attention/ Calculation 1 5 1   Recall 2 3 3   Language- name 2 objects 2 1 2   Language- repeat 0 0 1  Language- follow 3 step command 2 2 3   Language- read & follow direction 1 0 1  Write a sentence 1 0 0  Copy design 0 0 1  Total score 17 23 25    No flowsheet data found.  ASSESSMENT AND PLAN  88 y.o. year old female   Memory disturbance Gait disorder, possible parkinsonism   -MMSE 17/30 today, decline since September 23/30 -Rosamary continues to demonstrate a gradual decline, this all started at after presumed viral meningitis back in 2019  -Has been on Sinemet 25/100 mg, half a tablet twice daily for around  1 year, without clear benefit, she will try to increase starting with 1/2 tablet 3 times daily, then try 1 full tablet 3 times daily -On exam, she looks like a Parkinson's patient with masking of  the face, bradykinesia, shuffling gait; but her husband has not been able to provide history that suggest benefit with the Sinemet -I was able to discuss with Dr. Rexene Alberts about care plan going forward, will proceed with DAT scan to check for Parkinson's, she will come by the office once the exam is scheduled to check CMP beforehand -Does seem there is some behavorial issue going on with obsessions about trash -MRI of the brain has shown some atrophy affecting temporal lobes that could be consistent with memory disorder, mild small vessel disease no change from 2019 -B12, RPR, sed rate were unremarkable -Return back in 3-4 months with Dr. Kyra Manges, [facility] [address], [place], [place] [address] [phone]

## 2021-09-25 NOTE — Patient Instructions (Signed)
Increase Sinemet 1/2 tablet 3 times daily  Come back in 4 months to see Dr. Rexene Alberts for consult

## 2021-09-26 ENCOUNTER — Telehealth: Payer: Self-pay | Admitting: Neurology

## 2021-09-26 ENCOUNTER — Encounter: Payer: Self-pay | Admitting: Neurology

## 2021-09-26 NOTE — Telephone Encounter (Signed)
medicare/aarp no auth req sent to Nuclear medicine to be scheduled . Also, sent Shanon Brow a message he will reach out to the patient to schedule.

## 2021-10-01 NOTE — Telephone Encounter (Signed)
Shanon Brow stated: "I have called and left a VM for patient to call us back. I will try again this afternoon"

## 2021-10-10 ENCOUNTER — Encounter (HOSPITAL_COMMUNITY)
Admission: RE | Admit: 2021-10-10 | Discharge: 2021-10-10 | Disposition: A | Discharge status: Home / Self Care | Payer: Medicare Other | Source: Ambulatory Visit | Attending: Neurology | Admitting: Neurology

## 2021-10-10 ENCOUNTER — Other Ambulatory Visit: Payer: Self-pay

## 2021-10-10 DIAGNOSIS — R269 Unspecified abnormalities of gait and mobility: Secondary | ICD-10-CM | POA: Diagnosis present

## 2021-10-10 MED ORDER — POTASSIUM IODIDE (ANTIDOTE) 130 MG PO TABS: 130.0000 mg | ORAL_TABLET | Freq: Once | ORAL | Status: DC

## 2021-10-10 MED ORDER — IOFLUPANE I 123 185 MBQ/2.5ML IV SOLN
4.6000 | Freq: Once | INTRAVENOUS | Status: AC | PRN
  Administered 2021-10-10: 4.6 via INTRAVENOUS
  Filled 2021-10-10: qty 5

## 2021-10-10 MED ORDER — POTASSIUM IODIDE (ANTIDOTE) 130 MG PO TABS
ORAL_TABLET | ORAL | Status: AC
  Administered 2021-10-10: 130 mg via ORAL
  Filled 2021-10-10: qty 1

## 2021-10-11 ENCOUNTER — Telehealth: Payer: Self-pay | Admitting: Neurology

## 2021-10-11 NOTE — Telephone Encounter (Signed)
I called the patient, she answered, but couldn't hear me, her husband wasn't home, he didn't answer his cell phone #. Can you try to reach him and relay Dat scan suggestive of PD. Would encourage Sinemet since we have this information now, working up to 1 tablet 3 times daily, I think she is only doing 1/2 tablet 3 times daily. Look for benefit with movement, gait. Please schedule them with Dr. Rexene Alberts for evaluation in the next few weeks. ? ?IMPRESSION: ?Marked decreased activity within the LEFT and RIGHT striatum is of ?pattern suggestive of Parkinson's syndrome pathology. ?  ?Of note, DaTSCAN is not diagnostic of Parkinsonian syndromes, which ?remains a clinical diagnosis. DaTscan is an adjuvant test to aid in ?the clinical diagnosis of Parkinsonian syndromes. ?

## 2021-10-11 NOTE — Telephone Encounter (Addendum)
I spoke to her husband and review the information below. He will have her start increasing the medication. He would like an earlier appt to review the test results and diagnosis. Scheduled in an open slot on 10/16/21 w/ Dr. Rexene Alberts. ?

## 2021-10-11 NOTE — Telephone Encounter (Signed)
Attempted call again.  ? ?-Left message on home number. ?-No answer on husband's cell. VM not set up ?-Left message on dgt's cell (on DPR). ? ?Requested a call back to discuss results. She currently has an appt w/ Dr. Rexene Alberts on 01/24/22. We will also get this moved to an earlier date.  ? ?Provided our number and office hours on message.  ?

## 2021-10-16 ENCOUNTER — Encounter: Payer: Self-pay | Admitting: Neurology

## 2021-10-16 ENCOUNTER — Ambulatory Visit (INDEPENDENT_AMBULATORY_CARE_PROVIDER_SITE_OTHER): Payer: Medicare Other | Admitting: Neurology

## 2021-10-16 VITALS — BP 181/63 | HR 57 | Ht 65.0 in | Wt 128.8 lb

## 2021-10-16 DIAGNOSIS — R269 Unspecified abnormalities of gait and mobility: Secondary | ICD-10-CM

## 2021-10-16 DIAGNOSIS — Z9181 History of falling: Secondary | ICD-10-CM

## 2021-10-16 DIAGNOSIS — R413 Other amnesia: Secondary | ICD-10-CM

## 2021-10-16 DIAGNOSIS — G20 Parkinson's disease: Secondary | ICD-10-CM

## 2021-10-16 DIAGNOSIS — G20C Parkinsonism, unspecified: Secondary | ICD-10-CM

## 2021-10-16 NOTE — Patient Instructions (Signed)
Please continue with your parkinson's medication, Sinemet (generic name: carbidopa-levodopa) 25/100 mg: Take one pill 3 times a day. Please try to take the medication away from you mealtimes, that is, take at 9 AM, 1 PM and 5 PM daily and that way you can have breakfast at 10 AM, lunch at 2 PM and dinner at 6 PM.  ?Please use your walker at all times.  ?Follow up to see Regina [name] in 4 months.  ?

## 2021-10-16 NOTE — Progress Notes (Signed)
Patient subjective:    Patient ID: Regina Regina [name] is a 88 y.o. female.  HPI    Interim history:   Regina Regina [name] is an 88 year old right-handed woman with an underlying medical history of allergies, reflux disease, hypertension, thyroid disease, gait disorder and parkinsonism, who presents for follow-up consultation of her parkinsonism.  She is accompanied by her husband today and presents for a sooner than scheduled appointment after a recent DaT scan.  She has previously followed with Regina Regina [name] and has also seen Regina Presto, NP as well as Regina Denmark, NP.  She was most recently seen by Regina Regina [name] on 09/25/2021.  I reviewed the note and also copy prior notes below for reference. She had a DaTscan on 10/10/2021 and I reviewed the results: IMPRESSION: Marked decreased activity within the LEFT and RIGHT striatum is of pattern suggestive of Parkinson's syndrome pathology. Of note, DaTSCAN is not diagnostic of Parkinsonian syndromes, which remains a clinical diagnosis. DaTscan is an adjuvant test to aid in the clinical diagnosis of Parkinsonian syndromes.  Today, 10/16/2021: She reports feeling somewhat nervous.  She reports no pain.  Her husband provides most of her history.  She is also hard of hearing and does not have any hearing aids.  He reports that they have increased her Sinemet from half a pill twice Regina [name] to now 1 pill 3 times a day, she seems to tolerate it and seems to walk a little bit better and have more stamina.  She typically sits in her reclining chair most of the day but is able to be more upright lately.  She recently started seeing a dermatologist for brittle skin and swelling in her legs as well as redness in her legs.  She fell in December.  She was using a cane at the time.  She lives with her husband.  They have a 76 year old adoptive son and they also have 2 other grown children.  She takes Sinemet around 9 30-10 between 1 and 2 PM.  She eats at those times as well.  She hydrates fairly  well, per husband, she drinks about 3 bottles of water per day on average.  She has had some home health therapy but this finished.  The patient's allergies, current medications, family history, past medical history, past social history, past surgical history and problem list were reviewed and updated as appropriate.   Previously:  <<09/25/21 SS: Regina Regina [name] is here today with her husband for follow-up. Memory issues since suspected viral meningitis in summer 2019. MMSE 17/30. Memory has declined, isn't able to do much for herself. Needs help with ADLs. Needs helping using the bathroom. 1 fall after tripping over cane, skinned her arm. Using walker. Takes 1/2 tablet twice Regina [name] of Sinemet, hasn't noted much change with use. Had bad hips that make it hard for her to move around. She is in PT/OT now, not any change. Is hard of hearing. Her mood can be poor at times. Sleeps well. Can be obsessed with trash on the floor. In PT/OT not seeing any benefit. >>    <<09/25/21 ALL: Regina Regina [name] is a 88 y.o. female here today for follow up for memory disturbance and gait disorder concerning for parkinsonism. She presents with her husband who aids in history. Regina Regina [name] started her on Sinemet 25-100 1/2 tablet twice Regina [name] at last follow up 12/2020. Regina Regina [name] does not feel that it has made any significant difference in her movements. He does feel that hand tremor has improved. She seems to  be tolerating medication well.    She was hospitalized for a UTI on 8/29. She was discharged to [place] 9/1 and was discharged on 9/15. She started PT/OT last week. She has a Therapist, sports coming to check on her weekly. She has not had any falls since last being seen. She reports significant hip pain, bilaterally. Her husband feels this is a significant contributor to her not being active. She refuses to take pain medication for pain. Regina Regina [name] reports that she is sleeping more than normal. She has a good appetite. She denies depression. >>   <<12/28/20 SS: Ms.  Regina [name] is an 88 year old female with history of memory issues.  MRI of the brain in December 2021 showed atrophy affecting temporal lobes could be consistent with memory disorder.  Mild small vessel disease, no change from 2019.  She has some Parkinson features, tried 1/2 of Sinemet resulted in dizziness, she stopped it. Memory isn't all that bad, gets confused about things. Is scared to take a shower by herself, has no confidence. No fall since last seen. They adopted their 28 year grandson, has handicap issues. She worries about everything. Is not active, mostly sit around. She does her own laundry. Sleeps well, good appetite. Moves slow, has muscle soreness all over.  Here today for evaluation accompanied by her husband.>>   07/20/2020( Regina Regina [name]): <<Regina Regina [name] is an 88 year old right-handed white female with a history of some memory issues that occurred since she had a presumed viral meningitis in the summer 2019.  The patient returns the office today.  She is sleeping fairly well, there is some report from her husband that her confusion seems to be getting slowly worse over time.  The patient is no longer cooking, she will do housework, she does not operate a motor vehicle.  She needs assistance keeping up with medications and appointments.  She does report some cramping of her legs at nighttime.  She is hard of hearing and she reports that she is cold all the time.  She has leg swelling as well.  She returns to the office today for an evaluation.>>  Her Past Medical History Is Significant For: Past Medical History:  Diagnosis Date   Allergy    Barrett esophagus 08/12/2008   Cancer (HCC)    GERD (gastroesophageal reflux disease)    Hypertension    Thyroid disease    UTI (urinary tract infection)     Her Past Surgical History Is Significant For: Past Surgical History:  Procedure Laterality Date   APPENDECTOMY     COLON SURGERY     COLONOSCOPY     COLONOSCOPY N/A 08/03/2014   Procedure:  COLONOSCOPY;  Surgeon: Regina Castle, MD;  Location: [facility];  Service: Endoscopy;  Laterality: N/A;   POLYPECTOMY     TONSILLECTOMY     TOTAL THYROIDECTOMY     TUBAL LIGATION      Her Family History Is Significant For: Family History  Problem Relation Age of Onset   Heart disease Mother    Heart disease Father    Diabetes Sister    COPD Brother    Diabetes Brother    Parkinson's disease Neg Hx     Her Social History Is Significant For: Social History   Socioeconomic History   Marital status: Married    Spouse name: Jeneen Rinks   Number of children: Not on file   Years of education: Not on file   Highest education level: Not on file  Occupational History  Not on file  Tobacco Use   Smoking status: Never   Smokeless tobacco: Never  Substance and Sexual Activity   Alcohol use: No   Drug use: No   Sexual activity: Not Currently  Other Topics Concern   Not on file  Social History Narrative   Lives with husband and son   Right Handed   Drinks 1-2 cups caffeine Regina [name]   Social Determinants of Health   Financial Resource Strain: Not on file  Food Insecurity: Not on file  Transportation Needs: Not on file  Physical Activity: Not on file  Stress: Not on file  Social Connections: Not on file    Her Allergies Are:  Allergies  Allergen Reactions   Tape Other (See Comments)    TAPE PULLS OFF THE SKIN, SO PLEASE USE AN ALTERNATIVE   Penicillins Hives and Itching   Latex Rash and Other (See Comments)    NO Band-Aids!!  :   Her Current Medications Are:  Outpatient Encounter Medications as of 10/16/2021  Medication Sig   acetaminophen (TYLENOL) 325 MG tablet Take 325-650 mg by mouth every 6 (six) hours as needed for mild pain or headache.   calcitRIOL (ROCALTROL) 0.25 MCG capsule Take 0.25 mcg by mouth Regina [name].   carbidopa-levodopa (SINEMET) 25-100 MG tablet Take 0.5 tablets by mouth 3 (three) times Regina [name]. (Patient taking differently: Take 1 tablet by mouth 3 (three)  times Regina [name].)   carvedilol (COREG) 3.125 MG tablet Take 1 tablet (3.125 mg total) by mouth 2 (two) times Regina [name].   furosemide (LASIX) 40 MG tablet Please take 40 mg QPM  as needed for volume overload or leg edema (Patient taking differently: Take 40 mg by mouth in the morning.)   MYRBETRIQ 50 MG TB24 tablet Take 50 mg by mouth Regina [name].   senna-docusate (SENOKOT-S) 8.6-50 MG tablet Take 1 tablet by mouth Regina [name] as needed for mild constipation.   SYNTHROID 75 MCG tablet Take 75 mcg by mouth Regina [name] before breakfast.   SYSTANE ULTRA PF 0.4-0.3 % SOLN Place 1 drop into both eyes in the morning, at noon, and at bedtime.   No facility-administered encounter medications on file as of 10/16/2021.  :  Review of Systems:  Out of a complete 14 point review of systems, all are reviewed and negative with the exception of these symptoms as listed below:   Review of Systems  Neurological:        Pt is here for review of DATSCAN    Objective:  Neurological Exam  Physical Exam Physical Examination:   Vitals:   10/16/21 1406  BP: (!) 181/63  Pulse: (!) 57    General Examination: The patient is a very pleasant 88 y.o. female in no acute distress. She appears frail, well-groomed.   HEENT: Normocephalic, atraumatic, pupils are equal, round and reactive to light, tracking is mildly impaired, corrective eyeglasses in place, hearing is impaired, no hearing aids.  Face is symmetric with mild facial masking, mild nuchal rigidity, no lip, neck or jaw tremor.  Airway examination reveals moderate mouth dryness, no sialorrhea, speech with mild hypophonia, no significant dysarthria.    Chest: Clear to auscultation without wheezing, rhonchi or crackles noted.  Heart: S1+S2+0, regular and normal without murmurs, rubs or gallops noted.   Abdomen: Soft, non-tender and non-distended.  Extremities: There is 2+ pitting edema in the distal lower extremities bilaterally.   Skin: Warm and dry with significant bruising and  discoloration which appears chronic and quite severe in her upper extremities, also  redness and patchy discoloration with hypo and hyperpigmentation in her distal lower extremities with significant swelling noted.    Musculoskeletal: exam reveals no obvious joint deformities, arthritic changes in both hands.    Neurologically:  Mental status: The patient is awake, alert and pays attention but is unable to provide details of her history.  Her memory is impaired.   Cranial nerves II - XII are as described above under HEENT exam.  Motor exam: Thin bulk, global strength of 4 out of 5, no obvious resting tremor.  Fine motor skills with finger taps and foot taps are moderately impaired in the upper and lower extremities without obvious lateralization noted.    Cerebellar testing: No dysmetria or intention tremor. There is no truncal or gait ataxia.  Sensory exam: intact to light touch in the upper and lower extremities.  Gait, station and balance: She stands with difficulty and pushes herself up.  She requires minimal assistance from her husband.  Posture is moderately stooped and she has a slight lean to the right with her upper body.  She uses a 2 wheeled walker and has mild shuffling, no freezing, no obvious festination, mild difficulty with turns.   Assessment and Plan:  In summary, Regina Regina [name] is a very pleasant 88 y.o.-year old female  Ms. Regina Regina [name] is an 88 year old right-handed woman with an underlying medical history of allergies, reflux disease, hypertension, thyroid disease, gait disorder and parkinsonism, who presents for follow-up consultation of her parkinsonism.  She has associated memory loss.  She has fallen.  She has evidence of parkinsonism on examination but no telltale lateralization, unclear if she has idiopathic Parkinson's disease versus atypical parkinsonism.  A DaTscan earlier this month was supportive of an underlying parkinsonian syndrome with significantly diminished sleep  striated areas.  She has been able to tolerate levodopa at 1 pill 3 times Regina [name] but I did ask them to try to take this medication away from her mealtimes.  To that end, I suggested she take it at 9 AM, 1 PM and 5 PM Regina [name] and try to eat breakfast at 10 AM, lunch at 2 PM and dinner at 6 PM approximately.  They are agreeable with this approach, she is advised to use her walker at all times that we talked about the importance of fall prevention.  She is advised to stay well-hydrated and well rested and try to eat healthy and maintain weight.  She is advised to follow-up routinely to see Regina Denmark, NP in about 4 months in this clinic.  We will continue to monitor her memory.  We may consider a medication for memory loss in the near future but for now I would like for her to work on a stable schedule with her levodopa. I answered all their questions today and the patient and her husband were in agreement.   I spent 45 minutes in total face-to-face time and in reviewing records during pre-charting, more than 50% of which was spent in counseling and coordination of care, reviewing test results, reviewing medications and treatment regimen and/or in discussing or reviewing the diagnosis of parkinsonism, the prognosis and treatment options. Pertinent laboratory and imaging test results that were available during this visit with the patient were reviewed by me and considered in my medical decision making (see chart for details).

## 2021-10-30 ENCOUNTER — Telehealth: Payer: Self-pay | Admitting: Neurology

## 2021-10-30 MED ORDER — CARBIDOPA-LEVODOPA 25-100 MG PO TABS: 1.0000 | ORAL_TABLET | Freq: Three times a day (TID) | ORAL | 3 refills | Status: DC

## 2021-10-30 NOTE — Telephone Encounter (Signed)
Pt's husband states the Rx that was called in for carbidopa-levodopa (SINEMET) 25-100 MG tablet is not for enough based on how pt is to take the medication.  Husband states a new prescription is needed ?

## 2021-10-30 NOTE — Telephone Encounter (Signed)
Yes, looks like the quantity reflects the old dose of 1/2 tablet. Pt now takes 1 tablet TID. Rx sent to pharmacy with updated instructions.  ?

## 2021-11-01 ENCOUNTER — Other Ambulatory Visit: Payer: Self-pay

## 2021-11-01 ENCOUNTER — Ambulatory Visit (INDEPENDENT_AMBULATORY_CARE_PROVIDER_SITE_OTHER): Payer: Medicare Other | Admitting: Sports Medicine

## 2021-11-01 ENCOUNTER — Encounter: Payer: Self-pay | Admitting: Sports Medicine

## 2021-11-01 DIAGNOSIS — M79609 Pain in unspecified limb: Secondary | ICD-10-CM

## 2021-11-01 DIAGNOSIS — B351 Tinea unguium: Secondary | ICD-10-CM

## 2021-11-01 DIAGNOSIS — R413 Other amnesia: Secondary | ICD-10-CM

## 2021-11-01 DIAGNOSIS — I739 Peripheral vascular disease, unspecified: Secondary | ICD-10-CM

## 2021-11-01 NOTE — Progress Notes (Signed)
Subjective: ?Regina [name] is a 88 y.o. female patient seen today in office with complaint of mildly painful thickened and elongated toenails; unable to trim. Patient is assisted by husband who is also being seen. No other issues noted.  ? ?Patient Active Problem List  ? Diagnosis Date Noted  ? UTI (urinary tract infection) 04/09/2021  ? Gait disorder 12/28/2020  ? Malnutrition of moderate degree 03/10/2018  ? Acute on chronic diastolic CHF (congestive heart failure) ([place])   ? Hip pain   ? Acute cystitis without hematuria   ? Delirium   ? Memory loss   ? Ataxia 03/09/2018  ? Weakness generalized 03/09/2018  ? Acute renal failure superimposed on chronic kidney disease ([place]) 03/09/2018  ? Generalized weakness 03/09/2018  ? Internal hemorrhoids with complication 32/35/5732  ? Ulcer of lower limb ([place]) 08/02/2014  ? Chronic diastolic heart failure ([place]) 08/01/2014  ? Right bundle branch block 08/01/2014  ? Essential hypertension 08/01/2014  ? Hypothyroidism 08/01/2014  ? Rectal bleeding 07/31/2014  ? Hypertension 07/31/2014  ? GERD (gastroesophageal reflux disease) 07/31/2014  ? Anemia due to other cause 07/31/2014  ? Lower GI bleeding   ? Faintness   ? Syncope 07/30/2014  ? Varicose veins of lower extremities with other complications 20/25/4270  ? ? ?Current Outpatient Medications on File Prior to Visit  ?Medication Sig Dispense Refill  ? acetaminophen (TYLENOL) 325 MG tablet Take 325-650 mg by mouth every 6 (six) hours as needed for mild pain or headache.    ? calcitRIOL (ROCALTROL) 0.25 MCG capsule Take 0.25 mcg by mouth daily.    ? carbidopa-levodopa (SINEMET) 25-100 MG tablet Take 1 tablet by mouth 3 (three) times daily. 270 tablet 3  ? carvedilol (COREG) 3.125 MG tablet Take 1 tablet (3.125 mg total) by mouth 2 (two) times daily. 60 tablet 11  ? furosemide (LASIX) 40 MG tablet Please take 40 mg QPM  as needed for volume overload or leg edema (Patient taking differently: Take 40 mg by mouth in the morning.) 30  tablet 11  ? MYRBETRIQ 50 MG TB24 tablet Take 50 mg by mouth daily.    ? senna-docusate (SENOKOT-S) 8.6-50 MG tablet Take 1 tablet by mouth daily as needed for mild constipation. 30 tablet 1  ? SYNTHROID 75 MCG tablet Take 75 mcg by mouth daily before breakfast.    ? SYSTANE ULTRA PF 0.4-0.3 % SOLN Place 1 drop into both eyes in the morning, at noon, and at bedtime.    ? ?No current facility-administered medications on file prior to visit.  ? ? ?Allergies  ?Allergen Reactions  ? Tape Other (See Comments)  ?  TAPE PULLS OFF THE SKIN, SO PLEASE USE AN ALTERNATIVE  ? Penicillins Hives and Itching  ? Latex Rash and Other (See Comments)  ?  NO Band-Aids!!  ? ? ?Objective: ?Physical Exam ? ?General: Well developed, nourished, no acute distress, awake, alert and oriented x 2 ? ?Vascular: Dorsalis pedis artery 1/4 bilateral, Posterior tibial artery 0/4 bilateral, skin temperature warm to cool proximal to distal bilateral lower extremities, moderate varicosities, no pedal hair present bilateral. ? ?Neurological: Gross sensation present via light touch bilateral.  Semmes Weinstein monofilament intact bilateral. ? ?Dermatological: Skin is warm, dry, and supple bilateral, Nails 1-10 are tender, long, thick, and discolored with mild subungal debris, no webspace macerations present bilateral, no callus/corns/hyperkeratotic tissue present bilateral. No signs of infection bilateral. ? ?Musculoskeletal: No symptomatic boney deformities noted bilateral. Muscular strength within normal limits without painon range of  motion. No pain with calf compression bilateral. ? ?Assessment and Plan:  ?Problem List Items Addressed This Visit   ? ?  ? Other  ? Memory loss  ? ?Other Visit Diagnoses   ? ? Pain due to onychomycosis of nail    -  Primary  ? PVD (peripheral vascular disease) ([place])      ? ?  ? ? ?-Examined patient.  ?-Re-Discussed treatment options for painful mycotic nails. ?-Mechanically debrided and reduced painful mycotic nails with  sterile nail nipper and dremel nail file without incident. ?-Patient to return in 3 months for follow up evaluation or sooner if symptoms worsen. ? ?Landis Martins, DPM ? ?

## 2022-01-24 ENCOUNTER — Ambulatory Visit: Payer: Medicare Other | Admitting: Neurology

## 2022-02-05 ENCOUNTER — Ambulatory Visit (INDEPENDENT_AMBULATORY_CARE_PROVIDER_SITE_OTHER): Payer: Medicare Other | Admitting: Podiatry

## 2022-02-05 ENCOUNTER — Encounter: Payer: Self-pay | Admitting: Podiatry

## 2022-02-05 DIAGNOSIS — B351 Tinea unguium: Secondary | ICD-10-CM

## 2022-02-05 DIAGNOSIS — M79609 Pain in unspecified limb: Secondary | ICD-10-CM

## 2022-02-05 DIAGNOSIS — I739 Peripheral vascular disease, unspecified: Secondary | ICD-10-CM

## 2022-02-12 NOTE — Progress Notes (Signed)
  Subjective:  Patient ID: Regina [name], female    DOB: 1932-11-23,  MRN: [id number]  NISA DECAIRE presents to clinic today for painful elongated mycotic toenails 1-5 bilaterally which are tender when wearing enclosed shoe gear. Pain is relieved with periodic professional debridement.  Patient  is accompanied by her husband on today's visit.  New problem(s): None.   PCP is [name], MD , and last visit was March, 2023.  Allergies  Allergen Reactions   Tape Other (See Comments)    TAPE PULLS OFF THE SKIN, SO PLEASE USE AN ALTERNATIVE   Penicillins Hives and Itching   Latex Rash and Other (See Comments)    NO Band-Aids!!    Review of Systems: Negative except as noted in the HPI.  Objective: No changes noted in today's physical examination.  Vascular Examination: CFT <3 seconds b/l. DP pulses faintly palpable b/l. PT pulses diminished b/l. Digital hair absent. Skin temperature gradient warm to warm. No ischemia or gangrene. No cyanosis or clubbing noted b/l.    Neurological Examination: Sensation grossly intact b/l with 10 gram monofilament. Vibratory sensation intact b/l.   Dermatological Examination: Pedal integument with normal turgor, texture and tone BLE. No open wounds b/l LE. No interdigital macerations noted b/l LE. Toenails 1-5 b/l elongated, discolored, dystrophic, thickened, crumbly with subungual debris and tenderness to dorsal palpation.  Musculoskeletal Examination: Muscle strength 5/5 to b/l LE. No pain, crepitus or joint limitation noted with ROM bilateral LE. No gross bony deformities bilaterally.  Radiographs: None  Assessment/Plan: 1. Pain due to onychomycosis of nail   2. PVD (peripheral vascular disease) ([place])     -Patient was evaluated and treated. All patient's and/or POA's questions/concerns answered on today's visit. -Patient to continue soft, supportive shoe gear daily. -Mycotic toenails 1-5 bilaterally were debrided in length and girth with  sterile nail nippers and dremel without incident. -Patient/POA to call should there be question/concern in the interim.   Return in about 3 months (around 05/08/2022).  Marzetta Board, DPM

## 2022-02-13 NOTE — Progress Notes (Unsigned)
Patient: Regina [name] Date of Birth: Sep 21, 1932  Reason for Visit: Follow up  History from: Patient, husband Primary Neurologist: Dr. Rexene [name]   ASSESSMENT AND PLAN 88 y.o. year old female   59.  Parkinsonism 2.  Memory loss, MMSE 24/30 today  -I think there has been some improvement in her gait/mobility, her speech seems to flow more freely today  -Continue Sinemet 25/100 mg 3 times daily, have suggested 9 AM, 1 PM, 5 PM 1 hour before or after meals -Decided to hold off on starting Aricept due to low HR, start Namenda instead will do 5 mg at bedtime for 1 week, then 5 mg twice daily, stay at low dose  -BP has been elevated in AM, suggest discuss with PCP at appointment tomorrow, may need twice daily carvedilol  -Follow-up in 5 months or sooner if needed  Meds ordered this encounter  Medications   DISCONTD: donepezil (ARICEPT) 5 MG tablet    Sig: Take 1 tablet (5 mg total) by mouth at bedtime.    Dispense:  30 tablet    Refill:  3   memantine (NAMENDA) 5 MG tablet    Sig: Take 1 tablet at bedtime for 1 week, then take 1 tablet twice a day    Dispense:  60 tablet    Refill:  5    Please cancel the Aricept prescription   HISTORY  10/16/21 Dr. Rexene [name]: Ms. Malajah [name] is an 88 year old right-handed woman with an underlying medical history of allergies, reflux disease, hypertension, thyroid disease, gait disorder and parkinsonism, who presents for follow-up consultation of her parkinsonism.  She is accompanied by her husband today and presents for a sooner than scheduled appointment after a recent DaT scan.  She has previously followed with Dr. Jannifer [name] and has also seen Regina Presto, NP as well as Regina Denmark, NP.  She was most recently seen by Regina [name] on 09/25/2021.  I reviewed the note and also copy prior notes below for reference. She had a DaTscan on 10/10/2021 and I reviewed the results: IMPRESSION: Marked decreased activity within the LEFT and RIGHT striatum is of pattern suggestive of  Parkinson's syndrome pathology. Of note, DaTSCAN is not diagnostic of Parkinsonian syndromes, which remains a clinical diagnosis. DaTscan is an adjuvant test to aid in the clinical diagnosis of Parkinsonian syndromes.   Today, 10/16/2021: She reports feeling somewhat nervous.  She reports no pain.  Her husband provides most of her history.  She is also hard of hearing and does not have any hearing aids.  He reports that they have increased her Sinemet from half a pill twice daily to now 1 pill 3 times a day, she seems to tolerate it and seems to walk a little bit better and have more stamina.  She typically sits in her reclining chair most of the day but is able to be more upright lately.  She recently started seeing a dermatologist for brittle skin and swelling in her legs as well as redness in her legs.  She fell in December.  She was using a cane at the time.  She lives with her husband.  They have a 63 year old adoptive son and they also have 2 other grown children.  She takes Sinemet around 9 30-10 between 1 and 2 PM.  She eats at those times as well.  She hydrates fairly well, per husband, she drinks about 3 bottles of water per day on average.  She has had some home health therapy but this  finished.  02/14/22 Regina [name]: Her with husband, taking Sinemet 1 tablet 3 times daily (10 AM, 3 PM, 10 PM), husband hasn't seen any change, has noticed she is slowing down. Short term memory isn't as good. Husband thought initially some benefit, now he thinks she has slowed down. She changes the subject, loses her train of thought. Using walker, 1 fall in the yard, the rolling walker rolled away from her when she was standing. Sleeps well, eats good. Has low back pain from arthritis, won't take Tylenol, thinks it makes her legs swell.   REVIEW OF SYSTEMS: Out of a complete 14 system review of symptoms, the patient complains only of the following symptoms, and all other reviewed systems are negative.  See  HPI  ALLERGIES: Allergies  Allergen Reactions   Tape Other (See Comments)    TAPE PULLS OFF THE SKIN, SO PLEASE USE AN ALTERNATIVE   Penicillins Hives and Itching   Latex Rash and Other (See Comments)    NO Band-Aids!!    HOME MEDICATIONS: Outpatient Medications Prior to Visit  Medication Sig Dispense Refill   calcitRIOL (ROCALTROL) 0.25 MCG capsule Take 0.25 mcg by mouth daily.     carbidopa-levodopa (SINEMET) 25-100 MG tablet Take 1 tablet by mouth 3 (three) times daily. 270 tablet 3   carvedilol (COREG) 3.125 MG tablet Take 1 tablet (3.125 mg total) by mouth 2 (two) times daily. 60 tablet 11   MYRBETRIQ 50 MG TB24 tablet Take 50 mg by mouth daily.     SYNTHROID 75 MCG tablet Take 75 mcg by mouth daily before breakfast.     SYSTANE ULTRA PF 0.4-0.3 % SOLN Place 1 drop into both eyes in the morning, at noon, and at bedtime.     acetaminophen (TYLENOL) 325 MG tablet Take 325-650 mg by mouth every 6 (six) hours as needed for mild pain or headache. (Patient not taking: Reported on 02/14/2022)     furosemide (LASIX) 40 MG tablet Please take 40 mg QPM  as needed for volume overload or leg edema (Patient not taking: Reported on 02/14/2022) 30 tablet 11   senna-docusate (SENOKOT-S) 8.6-50 MG tablet Take 1 tablet by mouth daily as needed for mild constipation. (Patient not taking: Reported on 02/14/2022) 30 tablet 1   No facility-administered medications prior to visit.    PAST MEDICAL HISTORY: Past Medical History:  Diagnosis Date   Allergy    Barrett esophagus 08/12/2008   Cancer (HCC)    GERD (gastroesophageal reflux disease)    Hypertension    Thyroid disease    UTI (urinary tract infection)     PAST SURGICAL HISTORY: Past Surgical History:  Procedure Laterality Date   APPENDECTOMY     COLON SURGERY     COLONOSCOPY     COLONOSCOPY N/A 08/03/2014   Procedure: COLONOSCOPY;  Surgeon: Inda Castle, MD;  Location: [facility];  Service: Endoscopy;  Laterality: N/A;   POLYPECTOMY      TONSILLECTOMY     TOTAL THYROIDECTOMY     TUBAL LIGATION      FAMILY HISTORY: Family History  Problem Relation Age of Onset   Heart disease Mother    Heart disease Father    Diabetes Sister    COPD Brother    Diabetes Brother    Parkinson's disease Neg Hx     SOCIAL HISTORY: Social History   Socioeconomic History   Marital status: Married    Spouse name: Jeneen Rinks   Number of children: Not on file   Years  of education: Not on file   Highest education level: Not on file  Occupational History   Not on file  Tobacco Use   Smoking status: Never   Smokeless tobacco: Never  Substance and Sexual Activity   Alcohol use: No   Drug use: No   Sexual activity: Not Currently  Other Topics Concern   Not on file  Social History Narrative   Lives with husband and son   Right Handed   Drinks 1-2 cups caffeine daily   Social Determinants of Health   Financial Resource Strain: Not on file  Food Insecurity: Not on file  Transportation Needs: Not on file  Physical Activity: Not on file  Stress: Not on file  Social Connections: Not on file  Intimate Partner Violence: Not on file   PHYSICAL EXAM  Vitals:   02/14/22 1253  BP: (!) 129/50  Pulse: (!) 52  Height: '5\' 6"'$  (1.676 m)   Body mass index is 20.79 kg/m.    02/14/2022    1:29 PM 09/25/2021   11:25 AM 05/07/2021   10:41 AM  MMSE - Mini Mental State Exam  Orientation to time '4 1 4  '$ Orientation to Place '5 4 5  '$ Registration '3 3 3  '$ Attention/ Calculation '1 1 5  '$ Recall '3 2 3  '$ Language- name 2 objects '2 2 1  '$ Language- repeat 1 0 0  Language- follow 3 step command '3 2 2  '$ Language- read & follow direction 1 1 0  Write a sentence 1 1 0  Copy design 0 0 0  Total score '24 17 23    '$ Generalized: Well developed, in no acute distress, mild masking of the face, flat affect  Neurological examination  Mentation: Alert oriented to time, place, history taking.  Follows exam commands well, speech flows freely, does loose her  train of thought Cranial nerve II-XII: Pupils were equal round reactive to light. Extraocular movements were full, visual field were full on confrontational test. Facial sensation and strength were normal. Head turning and shoulder shrug  were normal and symmetric. Motor: The motor testing reveals 5 over 5 strength of all 4 extremities. Good symmetric motor tone is noted throughout.  2+ edema lower extremities Sensory: Sensory testing is intact to soft touch on all 4 extremities. No evidence of extinction is noted.  Coordination: Cerebellar testing reveals good finger-nose-finger and heel-to-shin bilaterally.  No tremor noted. Gait and station: Has to rock x 2 to stand, gait is wide-based, stooped posture, shuffles, uses walker, no freezing noted Reflexes: Deep tendon reflexes are symmetric and normal bilaterally.   DIAGNOSTIC DATA (LABS, IMAGING, TESTING) - I reviewed patient records, labs, notes, testing and imaging myself where available.  Lab Results  Component Value Date   WBC 8.2 04/12/2021   HGB 9.4 (L) 04/12/2021   HCT 28.5 (L) 04/12/2021   MCV 97.6 04/12/2021   PLT 150 04/12/2021      Component Value Date/Time   NA 140 04/11/2021 0338   NA 137 03/18/2018 1048   K 3.8 04/11/2021 0338   CL 109 04/11/2021 0338   CO2 25 04/11/2021 0338   GLUCOSE 89 04/11/2021 0338   BUN 18 04/11/2021 0338   BUN 20 03/18/2018 1048   CREATININE 0.94 04/11/2021 0338   CALCIUM 8.2 (L) 04/11/2021 0338   PROT 6.0 (L) 04/09/2021 0620   PROT 5.8 (L) 03/18/2018 1048   ALBUMIN 3.0 (L) 04/09/2021 0620   ALBUMIN 3.4 (L) 03/18/2018 1048   AST 19 04/09/2021 8563  ALT <5 04/09/2021 0620   ALKPHOS 57 04/09/2021 0620   BILITOT 1.3 (H) 04/09/2021 0620   BILITOT 0.3 03/18/2018 1048   GFRNONAA 59 (L) 04/11/2021 0338   GFRAA 49 (L) 04/22/2020 1342   No results found for: "CHOL", "HDL", "LDLCALC", "LDLDIRECT", "TRIG", "CHOLHDL" No results found for: "HGBA1C" Lab Results  Component Value Date    VITAMINB12 422 07/20/2020   Lab Results  Component Value Date   TSH 1.733 04/10/2021    Regina [name], AGNP-C, DNP 02/14/2022, 4:32 PM [facility] [address] [place], [place] [address] [phone]  I reviewed the above note and documentation by the Nurse Practitioner and agree with the history, exam, assessment and plan as outlined above. I was available for consultation. Should be very cautious with aricept, due to bradycardia ,but okay to monitor closely by family/husband.  Star Age, MD, PhD [facility])

## 2022-02-14 ENCOUNTER — Ambulatory Visit (INDEPENDENT_AMBULATORY_CARE_PROVIDER_SITE_OTHER): Payer: Medicare Other | Admitting: Neurology

## 2022-02-14 VITALS — BP 129/50 | HR 52 | Ht 66.0 in

## 2022-02-14 DIAGNOSIS — G20 Parkinson's disease: Secondary | ICD-10-CM | POA: Insufficient documentation

## 2022-02-14 DIAGNOSIS — R413 Other amnesia: Secondary | ICD-10-CM | POA: Diagnosis not present

## 2022-02-14 DIAGNOSIS — G20C Parkinsonism, unspecified: Secondary | ICD-10-CM | POA: Insufficient documentation

## 2022-02-14 MED ORDER — MEMANTINE HCL 5 MG PO TABS: ORAL_TABLET | ORAL | 5 refills | Status: DC

## 2022-02-14 MED ORDER — DONEPEZIL HCL 5 MG PO TABS: 5.0000 mg | ORAL_TABLET | Freq: Every day | ORAL | 3 refills | Status: DC

## 2022-02-14 NOTE — Addendum Note (Signed)
Addended by: Suzzanne Cloud on: 02/14/2022 04:38 PM   Modules accepted: Orders

## 2022-02-14 NOTE — Patient Instructions (Signed)
Start Aricept low dose 5 mg at bedtime for memory, try for 3 months if you like it, I will increase it  Continue Sinemet, try taking at 9 AM, 1 PM,  5 PM See you back in 5 months

## 2022-03-24 ENCOUNTER — Emergency Department (HOSPITAL_COMMUNITY)
Admission: EM | Admit: 2022-03-24 | Discharge: 2022-03-24 | Disposition: A | Discharge status: Home / Self Care | Payer: Medicare Other | Attending: Emergency Medicine | Admitting: Emergency Medicine

## 2022-03-24 ENCOUNTER — Other Ambulatory Visit: Payer: Self-pay

## 2022-03-24 ENCOUNTER — Encounter (HOSPITAL_COMMUNITY): Payer: Self-pay

## 2022-03-24 ENCOUNTER — Emergency Department (HOSPITAL_COMMUNITY): Payer: Medicare Other

## 2022-03-24 DIAGNOSIS — I10 Essential (primary) hypertension: Secondary | ICD-10-CM

## 2022-03-24 DIAGNOSIS — R55 Syncope and collapse: Secondary | ICD-10-CM

## 2022-03-24 DIAGNOSIS — Z91040 Latex allergy status: Secondary | ICD-10-CM | POA: Diagnosis not present

## 2022-03-24 DIAGNOSIS — E860 Dehydration: Secondary | ICD-10-CM

## 2022-03-24 DIAGNOSIS — T675XXA Heat exhaustion, unspecified, initial encounter: Secondary | ICD-10-CM

## 2022-03-24 DIAGNOSIS — Z23 Encounter for immunization: Secondary | ICD-10-CM | POA: Diagnosis not present

## 2022-03-24 LAB — URINALYSIS, ROUTINE W REFLEX MICROSCOPIC
Bilirubin Urine: NEGATIVE
Glucose, UA: NEGATIVE mg/dL
Hgb urine dipstick: NEGATIVE
Ketones, ur: NEGATIVE mg/dL
Nitrite: NEGATIVE
Protein, ur: NEGATIVE mg/dL
Specific Gravity, Urine: 1.01 (ref 1.005–1.030)
pH: 7 (ref 5.0–8.0)

## 2022-03-24 LAB — CK: Total CK: 25 U/L — ABNORMAL LOW (ref 38–234)

## 2022-03-24 LAB — COMPREHENSIVE METABOLIC PANEL
ALT: 6 U/L (ref 0–44)
AST: 13 U/L — ABNORMAL LOW (ref 15–41)
Albumin: 3 g/dL — ABNORMAL LOW (ref 3.5–5.0)
Alkaline Phosphatase: 52 U/L (ref 38–126)
Anion gap: 5 (ref 5–15)
BUN: 23 mg/dL (ref 8–23)
CO2: 29 mmol/L (ref 22–32)
Calcium: 9 mg/dL (ref 8.9–10.3)
Chloride: 107 mmol/L (ref 98–111)
Creatinine, Ser: 1.48 mg/dL — ABNORMAL HIGH (ref 0.44–1.00)
GFR, Estimated: 34 mL/min — ABNORMAL LOW (ref >60)
Glucose, Bld: 170 mg/dL — ABNORMAL HIGH (ref 70–99)
Potassium: 4 mmol/L (ref 3.5–5.1)
Sodium: 141 mmol/L (ref 135–145)
Total Bilirubin: 0.6 mg/dL (ref 0.3–1.2)
Total Protein: 5.6 g/dL — ABNORMAL LOW (ref 6.5–8.1)

## 2022-03-24 LAB — CBC
HCT: 31 % — ABNORMAL LOW (ref 36.0–46.0)
Hemoglobin: 10.3 g/dL — ABNORMAL LOW (ref 12.0–15.0)
MCH: 32.5 pg (ref 26.0–34.0)
MCHC: 33.2 g/dL (ref 30.0–36.0)
MCV: 97.8 fL (ref 80.0–100.0)
Platelets: 193 10*3/uL (ref 150–400)
RBC: 3.17 MIL/uL — ABNORMAL LOW (ref 3.87–5.11)
RDW: 13.6 % (ref 11.5–15.5)
WBC: 6.9 10*3/uL (ref 4.0–10.5)
nRBC: 0 % (ref 0.0–0.2)

## 2022-03-24 LAB — TROPONIN I (HIGH SENSITIVITY): Troponin I (High Sensitivity): 6 ng/L (ref <18)

## 2022-03-24 MED ORDER — SODIUM CHLORIDE 0.9 % IV BOLUS
500.0000 mL | Freq: Once | INTRAVENOUS | Status: AC
  Administered 2022-03-24: 500 mL via INTRAVENOUS

## 2022-03-24 MED ORDER — TETANUS-DIPHTH-ACELL PERTUSSIS 5-2.5-18.5 LF-MCG/0.5 IM SUSY
0.5000 mL | PREFILLED_SYRINGE | Freq: Once | INTRAMUSCULAR | Status: AC
  Administered 2022-03-24: 0.5 mL via INTRAMUSCULAR
  Filled 2022-03-24: qty 0.5

## 2022-03-24 NOTE — ED Provider Notes (Signed)
[facility] EMERGENCY DEPARTMENT Provider Note   CSN: [id number] Arrival date & time: 03/24/22  1759     History  Chief Complaint  Patient presents with   Heat Exposure    Regina [name] is a 88 y.o. female.  Patient s/p syncopal episode today, just pta. Had gone outside w walker for about an hour, indicates got to feeling very hot, and then felt sweaty, had fainting episode,  EMS was called, pt indicates mental status was described as c/w baseline, pt felt warm, applied icepacks, started iv, and transported. Pt states felt fine earlier today, had eaten/drank. Denies chest pain or discomfort. No sob. No cough or uri symptoms. Denies headache. Post fall c/o neck pain, mild, dull. No radicular pain. No numbness/weakness. No change in speech or vision. No abd pain or nvd. No rectal bleeding or melena. No dysuria but feels has to go to bathroom now. Does have abrasion to left knee, unsure of last tetanus. Denies other extremity pain or injury. No anticoag use.   The history is provided by the patient, medical records and the EMS personnel.       Home Medications Prior to Admission medications   Medication Sig Start Date End Date Taking? Authorizing Provider  acetaminophen (TYLENOL) 325 MG tablet Take 325-650 mg by mouth every 6 (six) hours as needed for mild pain or headache. Patient not taking: Reported on 02/14/2022    [provider]  calcitRIOL (ROCALTROL) 0.25 MCG capsule Take 0.25 mcg by mouth daily.    [provider]  carbidopa-levodopa (SINEMET) 25-100 MG tablet Take 1 tablet by mouth 3 (three) times daily. 10/30/21   Star Age, MD  carvedilol (COREG) 3.125 MG tablet Take 1 tablet (3.125 mg total) by mouth 2 (two) times daily. 04/12/21 04/12/22  Rick Duff, MD  furosemide (LASIX) 40 MG tablet Please take 40 mg QPM  as needed for volume overload or leg edema Patient not taking: Reported on 02/14/2022 03/13/18   Elgergawy, Silver Huguenin, MD  memantine  (NAMENDA) 5 MG tablet Take 1 tablet at bedtime for 1 week, then take 1 tablet twice a day 02/14/22   Suzzanne Cloud, NP  MYRBETRIQ 50 MG TB24 tablet Take 50 mg by mouth daily. 08/12/19   [provider]  senna-docusate (SENOKOT-S) 8.6-50 MG tablet Take 1 tablet by mouth daily as needed for mild constipation. Patient not taking: Reported on 02/14/2022 04/12/21   Rick Duff, MD  SYNTHROID 75 MCG tablet Take 75 mcg by mouth daily before breakfast.    [provider]  SYSTANE ULTRA PF 0.4-0.3 % SOLN Place 1 drop into both eyes in the morning, at noon, and at bedtime.    [provider]      Allergies    Tape, Penicillins, and Latex    Review of Systems   Review of Systems  Constitutional:  Negative for chills and fever.  HENT:  Negative for sore throat.   Eyes:  Negative for visual disturbance.  Respiratory:  Negative for cough and shortness of breath.   Cardiovascular:  Negative for chest pain, palpitations and leg swelling.  Gastrointestinal:  Negative for abdominal pain, diarrhea and vomiting.  Genitourinary:  Negative for dysuria and flank pain.  Musculoskeletal:  Positive for neck pain. Negative for back pain.  Skin:  Negative for rash.  Neurological:  Negative for speech difficulty, weakness, numbness and headaches.  Hematological:  Does not bruise/bleed easily.  Psychiatric/Behavioral:  Negative for agitation.  Physical Exam Updated Vital Signs BP (!) 184/51 (BP Location: Left Arm)   Temp 98.5 F (36.9 C) (Oral)   Resp 17   Ht 1.676 m ('5\' 6"'$ )   Wt 58.4 kg   SpO2 99%   BMI 20.78 kg/m  Physical Exam Vitals and nursing note reviewed.  Constitutional:      Appearance: Normal appearance. She is well-developed.  HENT:     Head: Atraumatic.     Nose: Nose normal.     Mouth/Throat:     Mouth: Mucous membranes are moist.  Eyes:     General: No scleral icterus.    Conjunctiva/sclera: Conjunctivae normal.     Pupils: Pupils are equal, round,  and reactive to light.  Neck:     Vascular: No carotid bruit.     Trachea: No tracheal deviation.  Cardiovascular:     Rate and Rhythm: Normal rate and regular rhythm.     Pulses: Normal pulses.     Heart sounds: Normal heart sounds. No murmur heard.    No friction rub. No gallop.  Pulmonary:     Effort: Pulmonary effort is normal. No respiratory distress.     Breath sounds: Normal breath sounds.  Chest:     Chest wall: No tenderness.  Abdominal:     General: Bowel sounds are normal. There is no distension.     Palpations: Abdomen is soft.     Tenderness: There is no abdominal tenderness. There is no guarding.     Comments: No abd contusion, pain or tenderness. No pulsatile mass.   Genitourinary:    Comments: No cva tenderness.  Musculoskeletal:        General: No swelling.     Cervical back: Normal range of motion and neck supple. No rigidity. No muscular tenderness.     Comments: CTLS spine, non tender, aligned, no step off. Good rom bil extremity without pain or focal bony tenderness.  Abrasion to left anterior knee, knee stable, no effusion.   Skin:    General: Skin is warm and dry.     Findings: No rash.  Neurological:     Mental Status: She is alert.     Comments: Alert, speech normal. GCS 15. Motor/sens grossly intact bil.   Psychiatric:        Mood and Affect: Mood normal.     ED Results / Procedures / Treatments   Labs (all labs ordered are listed, but only abnormal results are displayed) Results for orders placed or performed during the hospital encounter of 03/24/22  Urinalysis, Routine w reflex microscopic  Result Value Ref Range   Color, Urine YELLOW YELLOW   APPearance CLEAR CLEAR   Specific Gravity, Urine 1.010 1.005 - 1.030   pH 7.0 5.0 - 8.0   Glucose, UA NEGATIVE NEGATIVE mg/dL   Hgb urine dipstick NEGATIVE NEGATIVE   Bilirubin Urine NEGATIVE NEGATIVE   Ketones, ur NEGATIVE NEGATIVE mg/dL   Protein, ur NEGATIVE NEGATIVE mg/dL   Nitrite NEGATIVE  NEGATIVE   Leukocytes,Ua TRACE (A) NEGATIVE   RBC / HPF 0-5 0 - 5 RBC/hpf   WBC, UA 0-5 0 - 5 WBC/hpf   Bacteria, UA RARE (A) NONE SEEN   Squamous Epithelial / LPF 0-5 0 - 5  CBC  Result Value Ref Range   WBC 6.9 4.0 - 10.5 K/uL   RBC 3.17 (L) 3.87 - 5.11 MIL/uL   Hemoglobin 10.3 (L) 12.0 - 15.0 g/dL   HCT 31.0 (L) 36.0 - 46.0 %  MCV 97.8 80.0 - 100.0 fL   MCH 32.5 26.0 - 34.0 pg   MCHC 33.2 30.0 - 36.0 g/dL   RDW 13.6 11.5 - 15.5 %   Platelets 193 150 - 400 K/uL   nRBC 0.0 0.0 - 0.2 %  CK  Result Value Ref Range   Total CK 25 (L) 38 - 234 U/L  Comprehensive metabolic panel  Result Value Ref Range   Sodium 141 135 - 145 mmol/L   Potassium 4.0 3.5 - 5.1 mmol/L   Chloride 107 98 - 111 mmol/L   CO2 29 22 - 32 mmol/L   Glucose, Bld 170 (H) 70 - 99 mg/dL   BUN 23 8 - 23 mg/dL   Creatinine, Ser 1.48 (H) 0.44 - 1.00 mg/dL   Calcium 9.0 8.9 - 10.3 mg/dL   Total Protein 5.6 (L) 6.5 - 8.1 g/dL   Albumin 3.0 (L) 3.5 - 5.0 g/dL   AST 13 (L) 15 - 41 U/L   ALT 6 0 - 44 U/L   Alkaline Phosphatase 52 38 - 126 U/L   Total Bilirubin 0.6 0.3 - 1.2 mg/dL   GFR, Estimated 34 (L) >60 mL/min   Anion gap 5 5 - 15  Troponin I (High Sensitivity)  Result Value Ref Range   Troponin I (High Sensitivity) 6 <18 ng/L    EKG EKG Interpretation  Date/Time:  Sunday March 24 2022 18:09:14 EDT Ventricular Rate:  64 PR Interval:  189 QRS Duration: 156 QT Interval:  470 QTC Calculation: 485 R Axis:   68 Text Interpretation: Sinus rhythm Right bundle branch block Non-specific ST-t changes Confirmed by Lajean Saver [phone]) on 03/24/2022 6:15:14 PM  Radiology CT Cervical Spine Wo Contrast  Result Date: 03/24/2022 CLINICAL DATA:  Neck trauma. Patient became diaphoretic and unresponsive. EXAM: CT CERVICAL SPINE WITHOUT CONTRAST TECHNIQUE: Multidetector CT imaging of the cervical spine was performed without intravenous contrast. Multiplanar CT image reconstructions were also generated. RADIATION DOSE  REDUCTION: This exam was performed according to the departmental dose-optimization program which includes automated exposure control, adjustment of the mA and/or kV according to patient size and/or use of iterative reconstruction technique. COMPARISON:  MRI of the cervical spine 03/09/2018 FINDINGS: Alignment: No significant listhesis is present. Exaggerated cervical lordosis is present Skull base and vertebrae: Craniocervical junction is within normal limits. Vertebral body heights are normal. No acute fractures are present. Soft tissues and spinal canal: No prevertebral fluid or swelling. No visible canal hematoma. Disc levels: Multilevel disc disease is present. Central canal stenosis is greatest C5-6 and C6-7. Upper chest: The lung apices are clear. Thoracic inlet is within normal limits. IMPRESSION: 1. No acute fracture or traumatic subluxation. 2. Multilevel degenerative changes of the cervical spine as described. Electronically Signed   By: San Morelle M.D.   On: 03/24/2022 19:32   CT Head Wo Contrast  Result Date: 03/24/2022 CLINICAL DATA:  Head trauma, minor. Patient became diaphoretic in than unresponsive. Hypotension. Hyperthermia EXAM: CT HEAD WITHOUT CONTRAST TECHNIQUE: Contiguous axial images were obtained from the base of the skull through the vertex without intravenous contrast. RADIATION DOSE REDUCTION: This exam was performed according to the departmental dose-optimization program which includes automated exposure control, adjustment of the mA and/or kV according to patient size and/or use of iterative reconstruction technique. COMPARISON:  MR head without contrast 08/02/2020. CT head without contrast 8//19 FINDINGS: Brain: Atrophy and white matter changes are again noted. No acute infarct, hemorrhage, or mass lesion is present. The ventricles are of normal  size. No significant extraaxial fluid collection is present. The brainstem and cerebellum are within normal limits. Vascular: No  hyperdense vessel or unexpected calcification. Skull: Partially calcified dermoid present in the left parietooccipital scalp measures 12.5 mm. A similar subcutaneous lesion in the high right parietal scalp measures 11 mm. No acute extracranial soft tissue lesions are present. Calvarium is intact. No focal lytic or blastic lesions are present. Sinuses/Orbits: A polyp or mucous retention cyst is present in the right sphenoid sinus. Minimal mucosal thickening is present the right sphenoid sinus. The paranasal sinuses and mastoid air cells are otherwise clear. Bilateral lens replacements are noted. Globes and orbits are otherwise unremarkable. IMPRESSION: 1. No acute intracranial abnormality or significant interval change. 2. Stable atrophy and white matter disease. This likely reflects the sequela of chronic microvascular ischemia. Electronically Signed   By: San Morelle M.D.   On: 03/24/2022 19:28    Procedures Procedures    Medications Ordered in ED Medications  sodium chloride 0.9 % bolus 500 mL (has no administration in time range)  Tdap (BOOSTRIX) injection 0.5 mL (has no administration in time range)    ED Course/ Medical Decision Making/ A&P                           Medical Decision Making Problems Addressed: Dehydration: acute illness or injury with systemic symptoms Essential hypertension: chronic illness or injury that poses a threat to life or bodily functions Heat exhaustion, initial encounter: acute illness or injury with systemic symptoms that poses a threat to life or bodily functions Near syncope: acute illness or injury with systemic symptoms that poses a threat to life or bodily functions  Amount and/or Complexity of Data Reviewed Independent Historian: spouse    Details: hx External Data Reviewed: notes. Labs: ordered. Decision-making details documented in ED Course. Radiology: ordered and independent interpretation performed. Decision-making details documented in  ED Course. ECG/medicine tests: ordered and independent interpretation performed. Decision-making details documented in ED Course.  Risk Prescription drug management. Decision regarding hospitalization.   Iv ns. Continuous pulse ox and cardiac monitoring. Labs ordered/sent. Imaging ordered.   Diff dx includes heat exhaustion, syncope/near syncope, anemia, etc - dispo decision including potential need for admission considered - will get labs and imaging and reassess.   Reviewed nursing notes and prior charts for additional history. External reports reviewed. Additional history from: EMS, spouse.  Spouse indicates after being outside, pt c/o weak/faint, no associated cp or other c/o. Indicates ambulates w walker at baseline, and seems like normal self now.   Cardiac monitor: sinus rhythm, rate 70.  Labs reviewed/interpreted by me - hgb c/w prior. Trop  normal. Ua neg for uti.   CT reviewed/interpreted by me - no hem.   Abrasion cleaned, sterile dressing. Tetanus IM.  Ns bolus. Po fluids/food.   Pt tolerating po. No nv. Pt ambulatory to bathroom - no faintness or dizziness.   Pt requests d/c, iv out.    Pt currently appears stable for d/c.  Rec close pcp f/u.  Return precautions provided.            Final Clinical Impression(s) / ED Diagnoses Final diagnoses:  None    Rx / DC Orders ED Discharge Orders     None         Lajean Saver, MD 03/24/22 2307

## 2022-03-24 NOTE — ED Notes (Signed)
Patient ambulated to bathroom.

## 2022-03-24 NOTE — ED Notes (Signed)
Patient verbalizes understanding of d/c instructions. Opportunities for questions and answers were provided. Pt d/c from ED and ambulated to lobby.  

## 2022-03-24 NOTE — ED Triage Notes (Signed)
Arrived by [facility] pT WENT OUTSIDE TODAY FOR ABOUT an hour after lunch. Bcame diaphoretic, went unresponsive per EMS, hypotension and hyperthermia. Pt oriented back to baseline after going inside and ice packs applied

## 2022-03-24 NOTE — ED Notes (Signed)
Per lab, CBC clotted and will need to be redrawn.

## 2022-03-24 NOTE — ED Notes (Signed)
Per lab CMP and CK hemolyzed and will need to be recollected.

## 2022-03-24 NOTE — Discharge Instructions (Addendum)
It was our pleasure to provide your ER care today - we hope that you feel better.  Drink plenty of fluids/stay well hydrated.   Follow up closely with primary care doctor this coming week.  Return to ER right away if worse, new symptoms, fevers, new/severe pain, chest pain, trouble breathing, weak/fainting, or other concern.

## 2022-03-24 NOTE — ED Notes (Signed)
Patient ambulated to the bathroom.

## 2022-03-24 NOTE — ED Notes (Signed)
Sandwich bag and fluids at bedside for pt.

## 2022-04-11 ENCOUNTER — Telehealth: Payer: Self-pay | Admitting: Neurology

## 2022-04-11 NOTE — Telephone Encounter (Signed)
PT Sreejesh with [facility] has called for verbal orders :Home health PT 1 week 1 2 week 3 1 week 1 2 week 2 and 1 week 2 [facility]:  1 week 1 2 week 3  1 week 3  Add RN for Wound Care Sreejesh's # is [phone] vm is secure

## 2022-04-11 NOTE — Telephone Encounter (Signed)
I called and provide VO for physical therapy/ homehealth aid with help with ADLS.

## 2022-04-11 NOTE — Telephone Encounter (Signed)
I spoke with [name] with [facility] and provided verbal orders for Home Health PT and a Home Health Aid. I informed him orders for an RN for Wound Care would need to come from the patient's PCP. He verbalized understanding and appreciation for the call.

## 2022-04-11 NOTE — Telephone Encounter (Signed)
[facility] is asking for verbal orders for Home Health PT evaluation. Home health aid with ADL's.  They would be able to get someone to pt's home today if orders are sent over.

## 2022-04-13 IMAGING — NM NM DATSCAN
2 series · 12 of 12 positions shown · non-contrast
Comparison: None.

CLINICAL DATA: 88-year-old female hand tremors for several months.

EXAM:
NUCLEAR MEDICINE BRAIN IMAGING WITH SPECT  (DaTscan )
TECHNIQUE: SPECT images of the brain were obtained after intravenous injection
of radiopharmaceutical. 4 hour post injection imaging. Appropriate
positioning.
130 mg BOUCHARD STAT given orally for thyroid blockade.
RADIOPHARMACEUTICALS:  4.6 millicuries I 123 Ioflupane

[Series 1: spect - 159 kev _(id)_tra · 4.1mm · 4.14mm/px · 6 of 128 frames shown]
[frame 11/128]
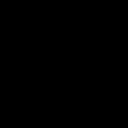
[frame 32/128]
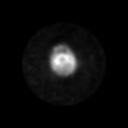
[frame 54/128]
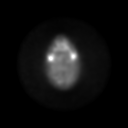
[frame 75/128]
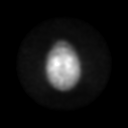
[frame 96/128]
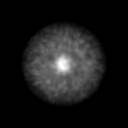
[frame 118/128]
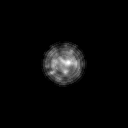

[Series 1: spect - 159 kev _(id)_cor · 4.1mm · 4.14mm/px · 6 of 128 frames shown]
[frame 11/128]
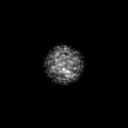
[frame 32/128]
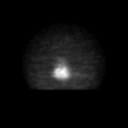
[frame 54/128]
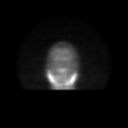
[frame 75/128]
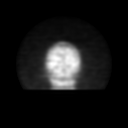
[frame 96/128]
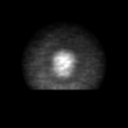
[frame 118/128]
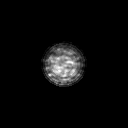

[12 of 12 positions shown; findings below may reference images not displayed]

FINDINGS: Marked decreased radiotracer activity within the LEFT and RIGHT
striatum. There is near absent activity in the bilateral putamen.
Decreased radiotracer activity in the heads of the caudate nuclei
IMPRESSION: Marked decreased activity within the LEFT and RIGHT striatum is of
pattern suggestive of Parkinson's syndrome pathology.

Of note, DaTSCAN is not diagnostic of Parkinsonian syndromes, which
remains a clinical diagnosis. DaTscan is an adjuvant test to aid in
the clinical diagnosis of Parkinsonian syndromes.

## 2022-05-15 ENCOUNTER — Ambulatory Visit (INDEPENDENT_AMBULATORY_CARE_PROVIDER_SITE_OTHER): Payer: Medicare Other | Admitting: Podiatry

## 2022-05-15 ENCOUNTER — Encounter: Payer: Self-pay | Admitting: Podiatry

## 2022-05-15 DIAGNOSIS — B351 Tinea unguium: Secondary | ICD-10-CM

## 2022-05-15 DIAGNOSIS — I739 Peripheral vascular disease, unspecified: Secondary | ICD-10-CM

## 2022-05-15 DIAGNOSIS — M79609 Pain in unspecified limb: Secondary | ICD-10-CM | POA: Diagnosis not present

## 2022-05-15 NOTE — Progress Notes (Signed)
  Subjective:  Patient ID: Regina [name], female    DOB: 02/05/1933,  MRN: [id number]  Lafonda Mosses presents to clinic today for:  Chief Complaint  Patient presents with   Nail Problem   She is accompanied by her husband on today's visit.  New problem(s): None.   PCP is [name], MD.  Allergies  Allergen Reactions   Tape Other (See Comments)    TAPE PULLS OFF THE SKIN, SO PLEASE USE AN ALTERNATIVE   Penicillins Hives and Itching   Latex Rash and Other (See Comments)    NO Band-Aids!!   Review of Systems: Negative except as noted in the HPI.  Objective: No changes noted in today's physical examination.  Regina [name] is a pleasant 88 y.o. female WD, WN in NAD. AAO x 3.  Vascular Examination: CFT <3 seconds b/l. DP pulses faintly palpable b/l. PT pulses diminished b/l. Digital hair absent. Skin temperature gradient warm to warm. No ischemia or gangrene. No cyanosis or clubbing noted b/l.    Neurological Examination: Sensation grossly intact b/l with 10 gram monofilament. Vibratory sensation intact b/l.   Dermatological Examination: Pedal integument with normal turgor, texture and tone BLE. No open wounds b/l LE. No interdigital macerations noted b/l LE. Toenails 1-5 b/l elongated, discolored, dystrophic, thickened, crumbly with subungual debris and tenderness to dorsal palpation.  Musculoskeletal Examination: Muscle strength 5/5 to b/l LE. No pain, crepitus or joint limitation noted with ROM bilateral LE. No gross bony deformities bilaterally.  Radiographs: None   Assessment/Plan: 1. Pain due to onychomycosis of nail   2. PVD (peripheral vascular disease) ([place])     No orders of the defined types were placed in this encounter.   -Patient's family member present. All questions/concerns addressed on today's visit. -Examined patient. -Toenails 1-5 b/l were debrided in length and girth with sterile nail nippers and dremel without iatrogenic bleeding.  -Patient/POA to call  should there be question/concern in the interim.   Return in about 3 months (around 08/15/2022).  Marzetta Board, DPM

## 2022-07-17 ENCOUNTER — Ambulatory Visit (INDEPENDENT_AMBULATORY_CARE_PROVIDER_SITE_OTHER): Payer: Medicare Other | Admitting: Neurology

## 2022-07-17 VITALS — BP 183/68 | HR 57 | Ht 66.0 in | Wt 128.0 lb

## 2022-07-17 DIAGNOSIS — G20C Parkinsonism, unspecified: Secondary | ICD-10-CM | POA: Diagnosis not present

## 2022-07-17 DIAGNOSIS — R413 Other amnesia: Secondary | ICD-10-CM | POA: Diagnosis not present

## 2022-07-17 MED ORDER — MEMANTINE HCL 10 MG PO TABS: 10.0000 mg | ORAL_TABLET | Freq: Two times a day (BID) | ORAL | 3 refills | Status: DC

## 2022-07-17 MED ORDER — CARBIDOPA-LEVODOPA 25-100 MG PO TABS: 1.0000 | ORAL_TABLET | Freq: Three times a day (TID) | ORAL | 3 refills | Status: DC

## 2022-07-17 NOTE — Progress Notes (Addendum)
Patient: EARL ZELLMER Date of Birth: 16-May-1933  Reason for Visit: Follow up  History from: Patient, husband Primary Neurologist: Dr. Rexene Alberts   ASSESSMENT AND PLAN 88 y.o. year old female   65.  Parkinsonism 2.  Memory loss, MMSE 56/30  -Mrs. Englander continues to have a gradual decline, MMSE today 22/30 (24/10 March 2022), DAT SCAN March 2023 showed marked decreased activity within the LEFT and RIGHT striatum is of pattern suggestive of Parkinson's syndrome pathology -For now, we will continue Sinemet 25/100 mg 3 times daily about 30 to 60 minutes before meals, we discussed doing PT/OT, husband does not feel she will continue exercises once completed, she just finished a course of therapy following heat related syncope in August 2023 -Encouraged to use a shower chair when showering, have a meal/drink water before showering, seems the heat is triggered near syncope spells -Closely monitor blood pressure, continue to work with PCP -Also discussed mood management with PCP, determine if medication may be indicated to help with ups and down, some OCD tendencies  -Continue Namenda 10 mg twice a day, hold off on Aricept due to low heart rate -Follow-up in about 5 months with Dr. Rexene Alberts   HISTORY  10/16/21 Dr. Rexene Alberts: Ms. Sulema Braid is an 88 year old right-handed woman with an underlying medical history of allergies, reflux disease, hypertension, thyroid disease, gait disorder and parkinsonism, who presents for follow-up consultation of her parkinsonism.  She is accompanied by her husband today and presents for a sooner than scheduled appointment after a recent DaT scan.  She has previously followed with Dr. Jannifer Franklin and has also seen Debbora Presto, NP as well as Butler Denmark, NP.  She was most recently seen by [name] on 09/25/2021.  I reviewed the note and also copy prior notes below for reference. She had a DaTscan on 10/10/2021 and I reviewed the results: IMPRESSION: Marked decreased activity within the LEFT and RIGHT  striatum is of pattern suggestive of Parkinson's syndrome pathology. Of note, DaTSCAN is not diagnostic of Parkinsonian syndromes, which remains a clinical diagnosis. DaTscan is an adjuvant test to aid in the clinical diagnosis of Parkinsonian syndromes.   Today, 10/16/2021: She reports feeling somewhat nervous.  She reports no pain.  Her husband provides most of her history.  She is also hard of hearing and does not have any hearing aids.  He reports that they have increased her Sinemet from half a pill twice daily to now 1 pill 3 times a day, she seems to tolerate it and seems to walk a little bit better and have more stamina.  She typically sits in her reclining chair most of the day but is able to be more upright lately.  She recently started seeing a dermatologist for brittle skin and swelling in her legs as well as redness in her legs.  She fell in December.  She was using a cane at the time.  She lives with her husband.  They have a 24 year old adoptive son and they also have 2 other grown children.  She takes Sinemet around 9 30-10 between 1 and 2 PM.  She eats at those times as well.  She hydrates fairly well, per husband, she drinks about 3 bottles of water per day on average.  She has had some home health therapy but this finished.  02/14/22 SS: Her with husband, taking Sinemet 1 tablet 3 times daily (10 AM, 3 PM, 10 PM), husband hasn't seen any change, has noticed she is slowing down. Short  term memory isn't as good. Husband thought initially some benefit, now he thinks she has slowed down. She changes the subject, loses her train of thought. Using walker, 1 fall in the yard, the rolling walker rolled away from her when she was standing. Sleeps well, eats good. Has low back pain from arthritis, won't take Tylenol, thinks it makes her legs swell.   Update July 17, 2022 SS: MMSE 22/30 today. Mentions few times when taking shower in the AM, while standing will feel faint. Would pass out, if her  husband wasn't there to sit her on the shower chair. In AM BP sometimes high 338 systolic, pcp added Valsartan, shower spells happen when BP is low. Remains on Sinemet 25/100 3 times daily 30 minutes before food, not sure has been any difference. Questions if this is causing erratic BP? Most morning doesn't get up until 11 AM. Memory is declining, needs help with all ADLs. Is hard to please her, grumpy. 1 fall in the summer after she passed out due to heat, had to go to ER. She sleeps well. Can focus on trash on the floor, carries multiple tissues around with her.  CT head August 2023 showed stable atrophy and white matter.  REVIEW OF SYSTEMS: Out of a complete 14 system review of symptoms, the patient complains only of the following symptoms, and all other reviewed systems are negative.  See HPI  ALLERGIES: Allergies  Allergen Reactions   Tape Other (See Comments)    TAPE PULLS OFF THE SKIN, SO PLEASE USE AN ALTERNATIVE   Penicillins Hives and Itching   Latex Rash and Other (See Comments)    NO Band-Aids!!    HOME MEDICATIONS: Outpatient Medications Prior to Visit  Medication Sig Dispense Refill   acetaminophen (TYLENOL) 325 MG tablet Take 325-650 mg by mouth every 6 (six) hours as needed for mild pain or headache.     calcitRIOL (ROCALTROL) 0.25 MCG capsule Take 0.25 mcg by mouth daily.     carbidopa-levodopa (SINEMET) 25-100 MG tablet Take 1 tablet by mouth 3 (three) times daily. 270 tablet 3   memantine (NAMENDA) 5 MG tablet Take 1 tablet at bedtime for 1 week, then take 1 tablet twice a day 60 tablet 5   senna-docusate (SENOKOT-S) 8.6-50 MG tablet Take 1 tablet by mouth daily as needed for mild constipation. 30 tablet 1   SYNTHROID 75 MCG tablet Take 75 mcg by mouth daily before breakfast.     SYSTANE ULTRA PF 0.4-0.3 % SOLN Place 1 drop into both eyes in the morning, at noon, and at bedtime.     carvedilol (COREG) 3.125 MG tablet Take 1 tablet (3.125 mg total) by mouth 2 (two) times  daily. 60 tablet 11   furosemide (LASIX) 40 MG tablet Please take 40 mg QPM  as needed for volume overload or leg edema (Patient not taking: Reported on 02/14/2022) 30 tablet 11   MYRBETRIQ 50 MG TB24 tablet Take 50 mg by mouth daily.     No facility-administered medications prior to visit.    PAST MEDICAL HISTORY: Past Medical History:  Diagnosis Date   Allergy    Barrett esophagus 08/12/2008   Cancer (HCC)    GERD (gastroesophageal reflux disease)    Hypertension    Thyroid disease    UTI (urinary tract infection)     PAST SURGICAL HISTORY: Past Surgical History:  Procedure Laterality Date   APPENDECTOMY     COLON SURGERY     COLONOSCOPY  COLONOSCOPY N/A 08/03/2014   Procedure: COLONOSCOPY;  Surgeon: Inda Castle, MD;  Location: [facility];  Service: Endoscopy;  Laterality: N/A;   POLYPECTOMY     TONSILLECTOMY     TOTAL THYROIDECTOMY     TUBAL LIGATION      FAMILY HISTORY: Family History  Problem Relation Age of Onset   Heart disease Mother    Heart disease Father    Diabetes Sister    COPD Brother    Diabetes Brother    Parkinson's disease Neg Hx     SOCIAL HISTORY: Social History   Socioeconomic History   Marital status: Married    Spouse name: Jeneen Rinks   Number of children: Not on file   Years of education: Not on file   Highest education level: Not on file  Occupational History   Not on file  Tobacco Use   Smoking status: Never   Smokeless tobacco: Never  Substance and Sexual Activity   Alcohol use: No   Drug use: No   Sexual activity: Not Currently  Other Topics Concern   Not on file  Social History Narrative   Lives with husband and son   Right Handed   Drinks 1-2 cups caffeine daily   Social Determinants of Health   Financial Resource Strain: Not on file  Food Insecurity: Not on file  Transportation Needs: Not on file  Physical Activity: Not on file  Stress: Not on file  Social Connections: Not on file  Intimate Partner  Violence: Not on file   PHYSICAL EXAM  Vitals:   07/17/22 1247  BP: (!) 183/68  Pulse: (!) 57  Weight: 128 lb (58.1 kg)  Height: '5\' 6"'$  (1.676 m)   Body mass index is 20.66 kg/m.    07/17/2022   12:49 PM 02/14/2022    1:29 PM 09/25/2021   11:25 AM  MMSE - Mini Mental State Exam  Orientation to time '3 4 1  '$ Orientation to Place '5 5 4  '$ Registration '3 3 3  '$ Attention/ Calculation '1 1 1  '$ Recall '2 3 2  '$ Language- name 2 objects '2 2 2  '$ Language- repeat 1 1 0  Language- follow 3 step command '3 3 2  '$ Language- read & follow direction '1 1 1  '$ Write a sentence '1 1 1  '$ Copy design 0 0 0  Total score '22 24 17   '$ Generalized: Well developed, in no acute distress, mild to moderate masking of the face, flat affect, displeased look  Neurological examination  Mentation: Alert, oriented, husband provides the history,  follows exam commands well, her speech is slightly soft/raspy  Cranial nerve II-XII: Pupils were equal round reactive to light. Extraocular movements were full, visual field were full on confrontational test. Facial sensation and strength were normal. Head turning and shoulder shrug  were normal and symmetric. Motor: Good strength all extremities, decreased finger taps mildly bilaterally, no significant rigidity was noted Sensory: Sensory testing is intact to soft touch on all 4 extremities. No evidence of extinction is noted.  Lower legs are sensitive to touch Coordination: Cerebellar testing reveals good finger-nose-finger and heel-to-shin bilaterally, movements are mildly slowed.  No tremor noted. Gait and station: Has to rock x 2 to stand, gait is wide-based, stooped posture, shuffles, uses walker, no freezing noted Reflexes: Deep tendon reflexes are symmetric and normal bilaterally.   DIAGNOSTIC DATA (LABS, IMAGING, TESTING) - I reviewed patient records, labs, notes, testing and imaging myself where available.  Lab Results  Component Value Date  WBC 6.9 03/24/2022   HGB 10.3 (L)  03/24/2022   HCT 31.0 (L) 03/24/2022   MCV 97.8 03/24/2022   PLT 193 03/24/2022      Component Value Date/Time   NA 141 03/24/2022 2140   NA 137 03/18/2018 1048   K 4.0 03/24/2022 2140   CL 107 03/24/2022 2140   CO2 29 03/24/2022 2140   GLUCOSE 170 (H) 03/24/2022 2140   BUN 23 03/24/2022 2140   BUN 20 03/18/2018 1048   CREATININE 1.48 (H) 03/24/2022 2140   CALCIUM 9.0 03/24/2022 2140   PROT 5.6 (L) 03/24/2022 2140   PROT 5.8 (L) 03/18/2018 1048   ALBUMIN 3.0 (L) 03/24/2022 2140   ALBUMIN 3.4 (L) 03/18/2018 1048   AST 13 (L) 03/24/2022 2140   ALT 6 03/24/2022 2140   ALKPHOS 52 03/24/2022 2140   BILITOT 0.6 03/24/2022 2140   BILITOT 0.3 03/18/2018 1048   GFRNONAA 34 (L) 03/24/2022 2140   GFRAA 49 (L) 04/22/2020 1342   No results found for: "CHOL", "HDL", "LDLCALC", "LDLDIRECT", "TRIG", "CHOLHDL" No results found for: "HGBA1C" Lab Results  Component Value Date   EUMPNTIR44 315 07/20/2020   Lab Results  Component Value Date   TSH 1.733 04/10/2021    Butler Denmark, AGNP-C, DNP 07/17/2022, 12:59 PM [facility] [address] [place], [place] [address] [phone]   I reviewed the above note and documentation by the Nurse Practitioner and agree with the history, exam, assessment and plan as outlined above. I was available for consultation. Star Age, MD, PhD [facility])

## 2022-07-17 NOTE — Patient Instructions (Addendum)
Recommend using a shower chair, having supervision during showering, drink water, eat before showering I would continue the Sinemet for now, along with Namenda Please discuss the mood with primary care  Let me know if you reconsider PT/OT See you back in 5 months

## 2022-08-26 ENCOUNTER — Telehealth: Payer: Self-pay | Admitting: Neurology

## 2022-08-26 NOTE — Telephone Encounter (Signed)
I would recommend getting PCP to see her ASAP. There are many reasons for passing out, she should get a check up and labs with PCP. I also recommend, if she were to pass out again to immediately call 911.

## 2022-08-26 NOTE — Telephone Encounter (Signed)
Pt husband is calling. Stated pt passed-out Thursday night she was sitting in the chair and passed right out. Stated she was out for about 25 minutes and she was dwelling spit from her mouth. Stated he called PCP on Friday and was told to let [name] know what was going on with pt.

## 2022-08-26 NOTE — Telephone Encounter (Signed)
I returned Jeneen Rinks call. He reports on 08/22/22 during the evening the pt was sitting at the bar in her kitchen and passed out. Husband states she slumped forward and began drooling at the moth and symptoms remained  est 25 min. She did not fall out the chair and was kept safe.  He reports pt has passed out before but most of the time heat is a trigger( prior to thurs previous episode was 6 weeks ago in the shower).  He reports the pt is feeling well today and back to baseline. He reached out to PCP on 08/23/22 and it was recommended he call our office to update. His next f/u with our office is May of 2024 with Dr. Rexene Alberts.  We discussed possible dehydration as a factor but husband does not believe this to be a contributing factor since she drinks and eats well throughout the day.  I advised I would update NP on this most recent event but to ensure safety measures as needed and be mindful of hydration status.  Most recent med change was namenda pt tolerating it well with no GI side effects.

## 2022-08-27 NOTE — Telephone Encounter (Signed)
If husband feels patient is completely back to normal, okay to monitor. If this occurs again he should call 911 right away, many etiologies for symptoms. Can she see if her primary care doctor can check labs to ensure no acute change?

## 2022-08-27 NOTE — Telephone Encounter (Signed)
I called pt's spouse back and updated on NP'/MD's recommendation. He states pt is back to her baseline and has felt well Friday through today. He will monitor symptoms for now and understands to call 911 for evaluation if event takes place again. He will also reach back out to PCP about possible lab evaluation.

## 2022-09-09 ENCOUNTER — Ambulatory Visit (INDEPENDENT_AMBULATORY_CARE_PROVIDER_SITE_OTHER): Payer: Medicare Other | Admitting: Podiatry

## 2022-09-09 ENCOUNTER — Encounter: Payer: Self-pay | Admitting: Podiatry

## 2022-09-09 VITALS — BP 198/73

## 2022-09-09 DIAGNOSIS — B351 Tinea unguium: Secondary | ICD-10-CM

## 2022-09-09 DIAGNOSIS — I739 Peripheral vascular disease, unspecified: Secondary | ICD-10-CM | POA: Diagnosis not present

## 2022-09-09 DIAGNOSIS — M79609 Pain in unspecified limb: Secondary | ICD-10-CM | POA: Diagnosis not present

## 2022-09-10 NOTE — Progress Notes (Signed)
  Subjective:  Patient ID: Regina [name], female    DOB: Dec 09, 1932,  MRN: [id number]  Regina [name] presents to clinic today for painful thick toenails that are difficult to trim. Pain interferes with ambulation. Aggravating factors include wearing enclosed shoe gear. Pain is relieved with periodic professional debridement.  Chief Complaint  Patient presents with   Nail Problem    [facility] PCP-[facility] PCP VST-2 Weeks ago   New problem(s): None.   PCP is [name], MD.  Allergies  Allergen Reactions   Tape Other (See Comments)    TAPE PULLS OFF THE SKIN, SO PLEASE USE AN ALTERNATIVE   Penicillins Hives and Itching   Latex Rash and Other (See Comments)    NO Band-Aids!!    Review of Systems: Negative except as noted in the HPI.  Objective: No changes noted in today's physical examination. Vitals:   09/09/22 1128 09/09/22 1138  BP: (!) 196/68 (!) 198/73   Regina [name] is a pleasant 89 y.o. female WD, WN in NAD. AAO x 3.  Vascular Examination: CFT <3 seconds b/l. DP pulses faintly palpable b/l. PT pulses diminished b/l. Digital hair absent. Skin temperature gradient warm to warm. No ischemia or gangrene. No cyanosis or clubbing noted b/l.    Neurological Examination: Sensation grossly intact b/l with 10 gram monofilament. Vibratory sensation intact b/l.   Dermatological Examination: Pedal integument with normal turgor, texture and tone BLE. No open wounds b/l LE. No interdigital macerations noted b/l LE.   Toenails 1-5 b/l elongated, discolored, dystrophic, thickened, crumbly with subungual debris and tenderness to dorsal palpation.  Musculoskeletal Examination: Muscle strength 5/5 to b/l LE. No pain, crepitus or joint limitation noted with ROM bilateral LE. No gross bony deformities bilaterally. Patient utilizes walker for gait assistance.  Radiographs: None  Assessment/Plan: 1. Pain due to onychomycosis of nail   2. PVD (peripheral vascular disease) ([place])      -Patient  was evaluated and treated. All patient's and/or POA's questions/concerns answered on today's visit. -Patient to continue soft, supportive shoe gear daily. -Mycotic toenails 1-5 bilaterally were debrided in length and girth with sterile nail nippers and dremel without incident. -Patient/POA to call should there be question/concern in the interim.   Return in about 3 months (around 12/09/2022).  Marzetta Board, DPM

## 2022-09-27 ENCOUNTER — Inpatient Hospital Stay (HOSPITAL_COMMUNITY)
Admission: EM | Admit: 2022-09-27 | Discharge: 2022-09-30 | DRG: 392 | Disposition: A | Discharge status: Home Health | Payer: Medicare Other | Attending: Internal Medicine | Admitting: Internal Medicine

## 2022-09-27 ENCOUNTER — Encounter (HOSPITAL_COMMUNITY): Payer: Self-pay

## 2022-09-27 ENCOUNTER — Emergency Department (HOSPITAL_COMMUNITY): Payer: Medicare Other

## 2022-09-27 DIAGNOSIS — Z825 Family history of asthma and other chronic lower respiratory diseases: Secondary | ICD-10-CM

## 2022-09-27 DIAGNOSIS — R195 Other fecal abnormalities: Secondary | ICD-10-CM | POA: Diagnosis present

## 2022-09-27 DIAGNOSIS — R21 Rash and other nonspecific skin eruption: Secondary | ICD-10-CM | POA: Diagnosis not present

## 2022-09-27 DIAGNOSIS — K529 Noninfective gastroenteritis and colitis, unspecified: Secondary | ICD-10-CM | POA: Diagnosis present

## 2022-09-27 DIAGNOSIS — I5032 Chronic diastolic (congestive) heart failure: Secondary | ICD-10-CM | POA: Diagnosis present

## 2022-09-27 DIAGNOSIS — G20A1 Parkinson's disease without dyskinesia, without mention of fluctuations: Secondary | ICD-10-CM | POA: Diagnosis present

## 2022-09-27 DIAGNOSIS — Z833 Family history of diabetes mellitus: Secondary | ICD-10-CM

## 2022-09-27 DIAGNOSIS — D631 Anemia in chronic kidney disease: Secondary | ICD-10-CM | POA: Diagnosis present

## 2022-09-27 DIAGNOSIS — E860 Dehydration: Secondary | ICD-10-CM | POA: Diagnosis present

## 2022-09-27 DIAGNOSIS — I130 Hypertensive heart and chronic kidney disease with heart failure and stage 1 through stage 4 chronic kidney disease, or unspecified chronic kidney disease: Secondary | ICD-10-CM | POA: Diagnosis present

## 2022-09-27 DIAGNOSIS — Z79899 Other long term (current) drug therapy: Secondary | ICD-10-CM

## 2022-09-27 DIAGNOSIS — Z85038 Personal history of other malignant neoplasm of large intestine: Secondary | ICD-10-CM

## 2022-09-27 DIAGNOSIS — Z880 Allergy status to penicillin: Secondary | ICD-10-CM

## 2022-09-27 DIAGNOSIS — N183 Chronic kidney disease, stage 3 unspecified: Secondary | ICD-10-CM | POA: Diagnosis present

## 2022-09-27 DIAGNOSIS — K5900 Constipation, unspecified: Secondary | ICD-10-CM | POA: Diagnosis present

## 2022-09-27 DIAGNOSIS — A09 Infectious gastroenteritis and colitis, unspecified: Principal | ICD-10-CM | POA: Diagnosis present

## 2022-09-27 DIAGNOSIS — R269 Unspecified abnormalities of gait and mobility: Secondary | ICD-10-CM | POA: Diagnosis present

## 2022-09-27 DIAGNOSIS — Z90710 Acquired absence of both cervix and uterus: Secondary | ICD-10-CM

## 2022-09-27 DIAGNOSIS — Z9049 Acquired absence of other specified parts of digestive tract: Secondary | ICD-10-CM

## 2022-09-27 DIAGNOSIS — Z888 Allergy status to other drugs, medicaments and biological substances status: Secondary | ICD-10-CM

## 2022-09-27 DIAGNOSIS — N179 Acute kidney failure, unspecified: Secondary | ICD-10-CM | POA: Diagnosis present

## 2022-09-27 DIAGNOSIS — R4189 Other symptoms and signs involving cognitive functions and awareness: Secondary | ICD-10-CM | POA: Diagnosis present

## 2022-09-27 DIAGNOSIS — E039 Hypothyroidism, unspecified: Secondary | ICD-10-CM | POA: Diagnosis present

## 2022-09-27 DIAGNOSIS — Z91040 Latex allergy status: Secondary | ICD-10-CM

## 2022-09-27 DIAGNOSIS — Z8249 Family history of ischemic heart disease and other diseases of the circulatory system: Secondary | ICD-10-CM

## 2022-09-27 LAB — LIPASE, BLOOD: Lipase: 29 U/L (ref 11–51)

## 2022-09-27 LAB — CBC WITH DIFFERENTIAL/PLATELET
Abs Immature Granulocytes: 0.05 10*3/uL (ref 0.00–0.07)
Basophils Absolute: 0.1 10*3/uL (ref 0.0–0.1)
Basophils Relative: 1 %
Eosinophils Absolute: 0 10*3/uL (ref 0.0–0.5)
Eosinophils Relative: 0 %
HCT: 37.5 % (ref 36.0–46.0)
Hemoglobin: 12.1 g/dL (ref 12.0–15.0)
Immature Granulocytes: 0 %
Lymphocytes Relative: 13 %
Lymphs Abs: 1.7 10*3/uL (ref 0.7–4.0)
MCH: 32.4 pg (ref 26.0–34.0)
MCHC: 32.3 g/dL (ref 30.0–36.0)
MCV: 100.3 fL — ABNORMAL HIGH (ref 80.0–100.0)
Monocytes Absolute: 1.2 10*3/uL — ABNORMAL HIGH (ref 0.1–1.0)
Monocytes Relative: 9 %
Neutro Abs: 9.7 10*3/uL — ABNORMAL HIGH (ref 1.7–7.7)
Neutrophils Relative %: 77 %
Platelets: 165 10*3/uL (ref 150–400)
RBC: 3.74 MIL/uL — ABNORMAL LOW (ref 3.87–5.11)
RDW: 12.6 % (ref 11.5–15.5)
WBC: 12.6 10*3/uL — ABNORMAL HIGH (ref 4.0–10.5)
nRBC: 0 % (ref 0.0–0.2)

## 2022-09-27 LAB — COMPREHENSIVE METABOLIC PANEL
ALT: 5 U/L (ref 0–44)
AST: 23 U/L (ref 15–41)
Albumin: 3.1 g/dL — ABNORMAL LOW (ref 3.5–5.0)
Alkaline Phosphatase: 56 U/L (ref 38–126)
Anion gap: 11 (ref 5–15)
BUN: 28 mg/dL — ABNORMAL HIGH (ref 8–23)
CO2: 21 mmol/L — ABNORMAL LOW (ref 22–32)
Calcium: 8.8 mg/dL — ABNORMAL LOW (ref 8.9–10.3)
Chloride: 105 mmol/L (ref 98–111)
Creatinine, Ser: 1.72 mg/dL — ABNORMAL HIGH (ref 0.44–1.00)
GFR, Estimated: 28 mL/min — ABNORMAL LOW (ref >60)
Glucose, Bld: 125 mg/dL — ABNORMAL HIGH (ref 70–99)
Potassium: 4.1 mmol/L (ref 3.5–5.1)
Sodium: 137 mmol/L (ref 135–145)
Total Bilirubin: 0.7 mg/dL (ref 0.3–1.2)
Total Protein: 5.7 g/dL — ABNORMAL LOW (ref 6.5–8.1)

## 2022-09-27 LAB — URINALYSIS, ROUTINE W REFLEX MICROSCOPIC
Bilirubin Urine: NEGATIVE
Glucose, UA: NEGATIVE mg/dL
Ketones, ur: 5 mg/dL — AB
Nitrite: NEGATIVE
Protein, ur: 30 mg/dL — AB
RBC / HPF: >50 RBC/hpf (ref 0–5)
Specific Gravity, Urine: >1.046 — ABNORMAL HIGH (ref 1.005–1.030)
WBC, UA: >50 WBC/hpf (ref 0–5)
pH: 5 (ref 5.0–8.0)

## 2022-09-27 LAB — LACTIC ACID, PLASMA
Lactic Acid, Venous: 1.4 mmol/L (ref 0.5–1.9)
Lactic Acid, Venous: 1 mmol/L (ref 0.5–1.9)

## 2022-09-27 LAB — PROTIME-INR
INR: 1.1 (ref 0.8–1.2)
Prothrombin Time: 14.5 seconds (ref 11.4–15.2)

## 2022-09-27 LAB — BRAIN NATRIURETIC PEPTIDE: B Natriuretic Peptide: 331 pg/mL — ABNORMAL HIGH (ref 0.0–100.0)

## 2022-09-27 MED ORDER — METRONIDAZOLE 500 MG/100ML IV SOLN
500.0000 mg | Freq: Three times a day (TID) | INTRAVENOUS | Status: DC
  Administered 2022-09-28 – 2022-09-30 (×8): 500 mg via INTRAVENOUS
  Filled 2022-09-27 (×8): qty 100

## 2022-09-27 MED ORDER — CIPROFLOXACIN IN D5W 400 MG/200ML IV SOLN
400.0000 mg | INTRAVENOUS | Status: DC
  Administered 2022-09-27 – 2022-09-28 (×2): 400 mg via INTRAVENOUS
  Filled 2022-09-27 (×3): qty 200

## 2022-09-27 MED ORDER — METRONIDAZOLE 500 MG/100ML IV SOLN
500.0000 mg | Freq: Once | INTRAVENOUS | Status: AC
  Administered 2022-09-27: 500 mg via INTRAVENOUS
  Filled 2022-09-27: qty 100

## 2022-09-27 MED ORDER — DONEPEZIL HCL 5 MG PO TABS
5.0000 mg | ORAL_TABLET | Freq: Every day | ORAL | Status: DC
  Administered 2022-09-27 – 2022-09-29 (×3): 5 mg via ORAL
  Filled 2022-09-27 (×3): qty 1

## 2022-09-27 MED ORDER — IOHEXOL 350 MG/ML SOLN
75.0000 mL | Freq: Once | INTRAVENOUS | Status: AC | PRN
  Administered 2022-09-27: 75 mL via INTRAVENOUS

## 2022-09-27 MED ORDER — ONDANSETRON HCL 4 MG/2ML IJ SOLN
4.0000 mg | Freq: Four times a day (QID) | INTRAMUSCULAR | Status: DC | PRN
  Administered 2022-09-29: 4 mg via INTRAVENOUS
  Filled 2022-09-27: qty 2

## 2022-09-27 MED ORDER — LACTATED RINGERS IV BOLUS
1000.0000 mL | Freq: Once | INTRAVENOUS | Status: AC
  Administered 2022-09-27: 1000 mL via INTRAVENOUS

## 2022-09-27 MED ORDER — CARBIDOPA-LEVODOPA 25-100 MG PO TABS
1.0000 | ORAL_TABLET | Freq: Three times a day (TID) | ORAL | Status: DC
  Administered 2022-09-28 – 2022-09-30 (×7): 1 via ORAL
  Filled 2022-09-27 (×7): qty 1

## 2022-09-27 MED ORDER — ACETAMINOPHEN 650 MG RE SUPP: 650.0000 mg | Freq: Four times a day (QID) | RECTAL | Status: DC | PRN

## 2022-09-27 MED ORDER — LACTATED RINGERS IV SOLN: INTRAVENOUS | Status: DC

## 2022-09-27 MED ORDER — SODIUM CHLORIDE 0.9 % IV SOLN
2.0000 g | Freq: Once | INTRAVENOUS | Status: AC
  Administered 2022-09-27: 2 g via INTRAVENOUS
  Filled 2022-09-27: qty 20

## 2022-09-27 MED ORDER — SODIUM CHLORIDE 0.9 % IV BOLUS
500.0000 mL | Freq: Once | INTRAVENOUS | Status: AC
  Administered 2022-09-27: 500 mL via INTRAVENOUS

## 2022-09-27 MED ORDER — SODIUM CHLORIDE 0.9 % IV SOLN: 1.0000 g | Freq: Every day | INTRAVENOUS | Status: DC

## 2022-09-27 MED ORDER — LEVOTHYROXINE SODIUM 75 MCG PO TABS
75.0000 ug | ORAL_TABLET | Freq: Every day | ORAL | Status: DC
  Administered 2022-09-28 – 2022-09-30 (×3): 75 ug via ORAL
  Filled 2022-09-27 (×3): qty 1

## 2022-09-27 MED ORDER — ACETAMINOPHEN 325 MG PO TABS
650.0000 mg | ORAL_TABLET | Freq: Four times a day (QID) | ORAL | Status: DC | PRN
  Filled 2022-09-27: qty 2

## 2022-09-27 MED ORDER — LACTATED RINGERS IV SOLN: INTRAVENOUS | Status: AC

## 2022-09-27 NOTE — ED Triage Notes (Addendum)
Pt bib EMS coming from home. Pt reports 6 days of constipation, abdominal pain and hemorrhoids . Last BM today had bright red blood. Pt reports headache and nausea at this time. Denies blood thinner use.

## 2022-09-27 NOTE — ED Provider Notes (Signed)
[facility] Provider Note   CSN: [id number] Arrival date & time: 09/27/22  1123     History  Chief Complaint  Patient presents with   Constipation    Regina [name] is a 89 y.o. female.  89 year old female with past medical history of hypertension, GERD, thyroid disease brought in by EMS for complaint of abdominal pain with bright red blood per rectum.  Patient states that she has been constipated, taking magnesium without improvement, went to the bathroom today and noted bright red blood in the commode and on the floor.  She is not anticoagulated.  Suspect that she has a small tear that is causing her bleeding.  Notes that she has generalized abdominal pain with nausea without vomiting.  Denies fevers, chills, changes in bladder habits.  Prior abdominal surgeries including hysterectomy, appendectomy, tubal ligation.       Home Medications Prior to Admission medications   Medication Sig Start Date End Date Taking? Authorizing Provider  acetaminophen (TYLENOL) 325 MG tablet Take 325-650 mg by mouth every 6 (six) hours as needed for mild pain or headache.    [provider]  calcitRIOL (ROCALTROL) 0.25 MCG capsule Take 0.25 mcg by mouth daily.    [provider]  carbidopa-levodopa (SINEMET) 25-100 MG tablet Take 1 tablet by mouth 3 (three) times daily. 07/17/22   Suzzanne Cloud, NP  carvedilol (COREG) 3.125 MG tablet Take 1 tablet (3.125 mg total) by mouth 2 (two) times daily. 04/12/21 04/12/22  Rick Duff, MD  memantine (NAMENDA) 10 MG tablet Take 1 tablet (10 mg total) by mouth 2 (two) times daily. 07/17/22   Suzzanne Cloud, NP  senna-docusate (SENOKOT-S) 8.6-50 MG tablet Take 1 tablet by mouth daily as needed for mild constipation. 04/12/21   Rick Duff, MD  SYNTHROID 75 MCG tablet Take 75 mcg by mouth daily before breakfast.    [provider]  SYSTANE ULTRA PF 0.4-0.3 % SOLN Place 1 drop into both eyes in  the morning, at noon, and at bedtime.    [provider]      Allergies    Tape, Penicillins, and Latex    Review of Systems   Review of Systems Negative except as per HPI Physical Exam Updated Vital Signs BP (!) 176/60 (BP Location: Right Arm)   Pulse 69   Temp 98.4 F (36.9 C) (Oral)   Resp 16   SpO2 100%  Physical Exam Vitals and nursing note reviewed.  Constitutional:      General: She is not in acute distress.    Appearance: She is well-developed. She is not diaphoretic.  HENT:     Head: Normocephalic and atraumatic.  Cardiovascular:     Rate and Rhythm: Normal rate and regular rhythm.     Heart sounds: Normal heart sounds.  Pulmonary:     Effort: Pulmonary effort is normal.     Breath sounds: Normal breath sounds.  Abdominal:     Palpations: Abdomen is soft.     Tenderness: There is generalized abdominal tenderness and tenderness in the right lower quadrant. There is guarding.  Musculoskeletal:     Right lower leg: No edema.     Left lower leg: No edema.  Skin:    General: Skin is warm and dry.     Findings: No erythema or rash.  Neurological:     Mental Status: She is alert and oriented to person, place, and time.  Psychiatric:  Behavior: Behavior normal.     ED Results / Procedures / Treatments   Labs (all labs ordered are listed, but only abnormal results are displayed) Labs Reviewed  CBC WITH DIFFERENTIAL/PLATELET - Abnormal; Notable for the following components:      Result Value   WBC 12.6 (*)    RBC 3.74 (*)    MCV 100.3 (*)    Neutro Abs 9.7 (*)    Monocytes Absolute 1.2 (*)    All other components within normal limits  COMPREHENSIVE METABOLIC PANEL - Abnormal; Notable for the following components:   CO2 21 (*)    Glucose, Bld 125 (*)    BUN 28 (*)    Creatinine, Ser 1.72 (*)    Calcium 8.8 (*)    Total Protein 5.7 (*)    Albumin 3.1 (*)    GFR, Estimated 28 (*)    All other components within normal limits   GASTROINTESTINAL PANEL BY PCR, STOOL (REPLACES STOOL CULTURE)  C DIFFICILE QUICK SCREEN W PCR REFLEX    LIPASE, BLOOD  PROTIME-INR  URINALYSIS, ROUTINE W REFLEX MICROSCOPIC  BRAIN NATRIURETIC PEPTIDE    EKG None  Radiology CT Abdomen Pelvis W Contrast  Result Date: 09/27/2022 CLINICAL DATA:  Abdominal pain. Constipation for 6 days. Hemorrhoids. Last bowel movement had bright red blood EXAM: CT ABDOMEN AND PELVIS WITH CONTRAST TECHNIQUE: Multidetector CT imaging of the abdomen and pelvis was performed using the standard protocol following bolus administration of intravenous contrast. RADIATION DOSE REDUCTION: This exam was performed according to the departmental dose-optimization program which includes automated exposure control, adjustment of the mA and/or kV according to patient size and/or use of iterative reconstruction technique. CONTRAST:  70m OMNIPAQUE IOHEXOL 350 MG/ML SOLN COMPARISON:  None Available. FINDINGS: Lower chest: There is some linear opacity at the lung bases, right-greater-than-left, likely atelectasis or scar. No pleural effusion. Some breathing motion. Hepatobiliary: Diffuse fatty liver infiltration identified. 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 Nonspecific. No separate space-occupying liver lesion. Gallbladder is nondilated. Pancreas: Mild pancreatic atrophy without obvious mass or ductal dilatation. Spleen: Lobular spleen but nonenlarged and no abnormal enhancement. Adrenals/Urinary Tract: Right adrenal gland is preserved. The left is slightly thickened, nonspecific. Mild bilateral renal atrophy. No enhancing renal mass or collecting system dilatation. Prominent renal sinus fat. The uterus has a normal course and caliber down to the bladder. The bladder is with a preserved contour. Only slight wall thickening, nonspecific. Bladder is underdistended. Stomach/Bowel: Long segment wall thickening along the colon involving the ascending colon,  transverse colon and proximal descending with stranding. Please correlate for a diffuse colitis. Stranding is greatest along the ascending colon. No obstruction or dilatation. The stomach is nondilated. Small bowel is nondilated. Vascular/Lymphatic: Diffuse vascular calcifications along the aorta and branch vessels. Aorta is nondilated. Significant plaque particularly along the renal arteries. Please correlate for any level of hypertension. Reproductive: Uterus and bilateral adnexa are unremarkable. Other: Slight anasarca. No significant anterior abdominal or pelvic wall hernia. No frank ascites. Musculoskeletal: Curvature of the spine with advanced degenerative changes. Also scattered degenerative changes of the pelvis. IMPRESSION: Long segment colitis involving ascending, transverse and proximal descending colon. Favor infectious or inflammatory process. Mesenteric stranding. No obstruction or free air. Nonspecific right hepatic lobe portal to hepatic vein shunt. Scattered vascular calcifications particularly involving the renal arteries and aorta. Please correlate for level hypertension. Slight wall thickening of the underdistended urinary bladder. Electronically Signed   By: AJill Side  M.D.   On: 09/27/2022 12:53    Procedures Procedures    Medications Ordered in ED Medications  cefTRIAXone (ROCEPHIN) 2 g in sodium chloride 0.9 % 100 mL IVPB (0 g Intravenous Stopped 09/27/22 1611)    And  metroNIDAZOLE (FLAGYL) IVPB 500 mg (500 mg Intravenous New Bag/Given 09/27/22 1608)  cefTRIAXone (ROCEPHIN) 1 g in sodium chloride 0.9 % 100 mL IVPB (has no administration in time range)    And  metroNIDAZOLE (FLAGYL) IVPB 500 mg (has no administration in time range)  acetaminophen (TYLENOL) tablet 650 mg (has no administration in time range)    Or  acetaminophen (TYLENOL) suppository 650 mg (has no administration in time range)  iohexol (OMNIPAQUE) 350 MG/ML injection 75 mL (75 mLs Intravenous Contrast Given  09/27/22 1242)  sodium chloride 0.9 % bolus 500 mL (500 mLs Intravenous New Bag/Given 09/27/22 1531)    ED Course/ Medical Decision Making/ A&P                             Medical Decision Making Amount and/or Complexity of Data Reviewed Labs: ordered. Radiology: ordered.  Risk Prescription drug management. Decision regarding hospitalization.   This patient presents to the ED for concern of abdominal pain, constant ration, bright red blood per rectum, this involves an extensive number of treatment options, and is a complaint that carries with it a high risk of complications and morbidity.  The differential diagnosis includes but not limited to diverticulitis, hemorrhoid, colitis, colon mass   Co morbidities that complicate the patient evaluation  History of colon cancer in 1970, treated with resection,.  Esophagus, GERD, hypertension, thyroid disease, CHF   Additional history obtained:  Additional history obtained from husband who contributes to history as above External records from outside source obtained and reviewed including recent visit to neurology dated 09/17/2021 for Parkinson's   Lab Tests:  I Ordered, and personally interpreted labs.  The pertinent results include: CBC with mild epistaxis white count of 12.6.  INR normal, lipase within normal notes.  CMP with mildly elevated creatinine compared to baseline at 1.72 with GFR of 28   Imaging Studies ordered:  I ordered imaging studies including CT abdomen pelvis I independently visualized and interpreted imaging which showed colitis I agree with the radiologist interpretation  Consultations Obtained:  I requested consultation with the ER attending, Dr. Jeanell Sparrow,  and discussed lab and imaging findings as well as pertinent plan - they recommend: admit for antibiotics and monitoring. Discussed with the IM service who will consult for admission.    Problem List / ED Course / Critical interventions / Medication  management  89 year old female presents with complaint of abdominal discomfort with report of constipation, taking magnesium at home without relief.  Had a bowel movement today with bright red blood in the commode as well as on the floor.  Spouse at bedside reports concerning amount of bleeding, H&H within normal notes.  CT abdomen pelvis with contrast was ordered pending patient's renal function.  Unfortunately, the scan was obtained with IV contrast prior to labs resulting.  Patient provided with IV fluids.  This was discussed with the admitting team to monitor patient's renal function closely.  She is found to have significant colitis, is provided with IV antibiotics with plan to admit and monitor. I ordered medication including Rocephin, Flagyl for colitis.  IV fluids for mild hydration Reevaluation of the patient after these medicines showed that the patient stayed the same  I have reviewed the patients home medicines and have made adjustments as needed   Social Determinants of Health:  History of Parkinson's, lives with spouse who provides care   Test / Admission - Considered:  Admit for further management         Final Clinical Impression(s) / ED Diagnoses Final diagnoses:  Colitis    Rx / DC Orders ED Discharge Orders     None         Tacy Learn, PA-C 09/27/22 1654    Pattricia Boss, MD 09/28/22 1537

## 2022-09-27 NOTE — Progress Notes (Signed)
PHARMACY NOTE:  ANTIMICROBIAL RENAL DOSAGE ADJUSTMENT  Current antimicrobial regimen includes a mismatch between antimicrobial dosage and estimated renal function.  As per policy approved by the Pharmacy & Therapeutics and Medical Executive Committees, the antimicrobial dosage will be adjusted accordingly.  Current antimicrobial dosage:  Cipro 420m IV Q12  Indication: Intra-abdominal infection  Renal Function: CrCl ~ 20-25 ml/min  CrCl cannot be calculated (Unknown ideal weight.). []$      On intermittent HD, scheduled: []$      On CRRT    Antimicrobial dosage has been changed to:  Cipro 400 mg IV q24  Additional comments:   Thank you for allowing pharmacy to be a part of this patient's care.  ELorenso Courier PharmD Clinical Pharmacist 09/27/2022 5:50 PM

## 2022-09-27 NOTE — ED Notes (Signed)
RN assisted pt with obtaining urine sample. Peri care provided but significant amount of stool was in specimen therefore unable to send. Provider made aware

## 2022-09-27 NOTE — H&P (Signed)
Date: 09/27/2022               [name] Name:  Regina [name] MRN: [id number]  DOB: 12/28/1932 Age / Sex: 89 y.o., female   PCP: [name], MD         Medical Service: Internal Medicine Teaching Service         Attending Physician: Dr. Aldine Contes, MD    First Contact: Roswell Nickel, MD      Pager: [phone]      Second Contact: Madelyn Flavors, MD      Pager: [phone]           After Hours (After 5p/  First Contact Pager: [phone]  weekends / holidays): Second Contact Pager: [phone]   SUBJECTIVE   Chief Complaint: Bloody stools   History of Present Illness:  Regina [name] is an 89 year old female with hypertension, hypothyroidism, chronic diastolic heart failure, chronic kidney disease, and Parkinsonism who presents with bloody stools.  [name] has some baseline memory impairment and is accompanied by Regina [name] husband at bedside, who provided most details of Regina medical history. According to Regina [name] husband, Regina [name] produced a large loose bright-red-colored stool yesterday 2-15 around 17:00-18:00. Since that bowel movement, Regina [name] has been experiencing generalized abdominal discomfort and associated nausea without emesis. Prior to that bowel movement, Regina [name] had only been passing small pellets of stool for several days. History is notable for multiple years of red-spotted stools secondary to hemorrhoids. This bowel movement was much bloodier and more loose. Since that episode, Regina [name] has produced about five bloody bowel movements. Consequently, and given Regina [name] concurrent abdominal discomfort, Regina [name] has not consumed anything since Regina initial bloody stool episode. Additionally, Regina [name] has not drunk anything other than a few small sips of water. Denies fever, diaphoresis, melena, lightheadedness, dizziness, syncope, and known sick contacts.   History notable for colon cancer identified via colonoscopy in Regina 1980s that was subsequently treated with colectomy. Regina [name] husband reports that  about eight inches of Regina [name] bowel were removed at that time. Regina [name] followed up with Regina [name] gastroenterologist for a long time afterward and Regina cancer never recurred.     ED Course: Upon arrival to [facility] ED, vitals were notable for BP 129/53. Laboratory testing demonstrated WBC 12.6, Bicarb 21, Gluc 125, BUN 28, Cr 1.72, Ca 8.8, Alb 3.1, and GFR 28. Abdominal CT revealed long segment colitis involving ascending, transverse, and proximal descending colon. One 59m bolus of NS was given and ceftriaxone started in Regina ED.    Meds:  No outpatient medications have been marked as taking for Regina [facility]    Past Medical History  Past Surgical History:  Procedure Laterality Date   APPENDECTOMY     COLON SURGERY     COLONOSCOPY     COLONOSCOPY N/A 08/03/2014   Procedure: COLONOSCOPY;  Surgeon: RInda Castle MD;  Location: [facility]  Service: Endoscopy;  Laterality: N/A;   POLYPECTOMY     TONSILLECTOMY     TOTAL THYROIDECTOMY     TUBAL LIGATION       Social:  Lives With: husband and adopted son (171yo) who has CP Occupation: retired Support: elderly husband Level of Function: requires assistance with most ADLs and all IADLs PCP: RJilda PandaMD    Family History:   Family History  Problem Relation Age of Onset   Heart disease Mother    Heart disease Father    Diabetes Sister    COPD Brother  Diabetes Brother    Parkinson's disease Neg Hx       Allergies: Allergies as of 09/27/2022 - Review Complete 09/27/2022  Allergen Reaction Noted   Tape Other (See Comments) 04/09/2021   Penicillins Hives and Itching 10/06/2008   Latex Rash and Other (See Comments) 04/09/2021      Review of Systems: A complete ROS was negative except as per HPI.    OBJECTIVE:   Physical Exam: Blood pressure (!) 130/47, pulse 62, temperature 98.5 F (36.9 C), temperature source Oral, resp. rate 16, SpO2 96 %.   General:      awake and alert, lying  uncomfortably in bed, cooperative, not in acute distress Skin:       warm and dry, decreased skin turgor, no rashes Lungs:      normal respiratory effort, breathing unlabored, symmetrical chest rise, no crackles or wheezing Cardiac:      regular rate and rhythm, normal S1 and S2, no pitting edema Abdomen:      soft and non-distended, hyperactive bowel sounds, generalized tenderness to palpation, no guarding or rigidity, palpable masses and spotty bright red blood on digital rectal examination Neurologic:      oriented to person-place-time, mild shuffling gait, moving all extremities, no gross focal deficits Psychiatric:      euthymic mood with congruent affect, intelligible speech    Labs: CBC    Component Value Date/Time   WBC 12.6 (H) 09/27/2022 1150   RBC 3.74 (L) 09/27/2022 1150   HGB 12.1 09/27/2022 1150   HCT 37.5 09/27/2022 1150   PLT 165 09/27/2022 1150   MCV 100.3 (H) 09/27/2022 1150   MCH 32.4 09/27/2022 1150   MCHC 32.3 09/27/2022 1150   RDW 12.6 09/27/2022 1150   LYMPHSABS 1.7 09/27/2022 1150   MONOABS 1.2 (H) 09/27/2022 1150   EOSABS 0.0 09/27/2022 1150   BASOSABS 0.1 09/27/2022 1150     CMP     Component Value Date/Time   NA 137 09/27/2022 1150   NA 137 03/18/2018 1048   K 4.1 09/27/2022 1150   CL 105 09/27/2022 1150   CO2 21 (L) 09/27/2022 1150   GLUCOSE 125 (H) 09/27/2022 1150   BUN 28 (H) 09/27/2022 1150   BUN 20 03/18/2018 1048   CREATININE 1.72 (H) 09/27/2022 1150   CALCIUM 8.8 (L) 09/27/2022 1150   PROT 5.7 (L) 09/27/2022 1150   PROT 5.8 (L) 03/18/2018 1048   ALBUMIN 3.1 (L) 09/27/2022 1150   ALBUMIN 3.4 (L) 03/18/2018 1048   AST 23 09/27/2022 1150   ALT 5 09/27/2022 1150   ALKPHOS 56 09/27/2022 1150   BILITOT 0.7 09/27/2022 1150   BILITOT 0.3 03/18/2018 1048   GFRNONAA 28 (L) 09/27/2022 1150   GFRAA 49 (L) 04/22/2020 1342     Imaging:  CT Abdomen Pelvis W Contrast Result Date: 09/27/2022 IMPRESSION: Long segment colitis involving  ascending, transverse and proximal descending colon. Favor infectious or inflammatory process. Mesenteric stranding. No obstruction or free air. Nonspecific right hepatic lobe portal to hepatic vein shunt. Scattered vascular calcifications particularly involving Regina renal arteries and aorta. Please correlate for level hypertension. Slight wall thickening of Regina underdistended urinary bladder.   ECG: None performed    ASSESSMENT & PLAN:   Assessment & Plan by Problem: Principal Problem:   Acute colitis   Regina Brooking is an 89 year old female with hypertension, hypothyroidism, chronic diastolic heart failure, chronic kidney disease, and Parkinsonism who presents with bright-red-colored stools, now admitted for management of colitis and further diagnostic  workup.   ---Acute long-segment colitis ---History of hemorrhoids [name] presented with five episodes of loose bright-red-colored stools and abdominal pain that started on 2-15. Regina [name] reports poor oral intake of food and liquid over last 24 hours. Denies dizziness, lightheadedness, emesis, melena, and syncope. Upon arrival to Regina ED, abdominal-pelvic CT revealed long segment colitis involving ascending, transverse, and proximal descending colon. Exam notable for diffuse abdominal tenderness, palpable intrarectal masses, and red blood per rectum. Hemoglobin remains stable at 12.1. Etiology of bleed most likely colitis, either infectious or inflammatory. Hemorrhoids possibly contributing to bleed as well. Further diagnostic workup is warranted to rule out infectious cause. Antibiotics were started for Gram-negative and anaerobic coverage. > Gastroenterology consult, appreciate recommendations > Ciprofloxacin 482m q24 > Metronidazole [id number] > Acetaminophen 65053m6 PRN > Ondansetron 4mg61m PRN > Diet NPO > Check gastrointestinal panel  > Check lactic acid > Check aPTT and PT-INR > Trend CBC q24   ---Acute on chronic kidney disease  III [name] has history of chronic kidney disease with GFR values fluctuating between 28 and 59 per electronic medical record. Upon arrival, Regina [name] creatinine was elevated to 1.72 above approximate baseline of 1.0-1.3. Given poor oral intake and decreased skin turgor on exam, etiology most likely prerenal secondary to dehydration. Contrast from CT possibly contributing as well. Intravenous fluids were started in Regina ED and continued upon admission. > Intravenous LR at 100mL78mfor 12hr > Trend BMP q24   ---Parkinsonism [name] has history of parkinsonism first diagnosed in 07-2020 per electronic medical record. Regina [name] has memory and gait disturbances at baseline, requires assistance with most daily activities. Currently takes carbidopa-levodopa and donepezil.  > Carbidopa-levodopa 25-100mg 42m Donepezil 5mg q270m Delirium precautions > Physical-occupational therapy evaluation   ---Chronic diastolic heart failure [name] has history of chronic diastolic heart failure, most recent echocardiogram performed 02-2018 demonstrated EF 60-65 and grade I diastolic dysfunction. Regina [name] takes carvedilol at home. Denies breath shortness, orthopnea, and chest pain. Exam negative for signs of volume overload. > Check BNP > Hold home carvedilol given acute bleed   ---Hypertension [name] has history of hypertension managed at home with carvedilol daily. Upon arrival, blood pressure 129/53 and has remained elevated since admission. Primary team is currently holding carvedilol to preserve blood pressure in setting of acute bleed that could precipitate hypovolemia. > Hold home carvedilol given acute bleed   ---Hypothyroidism [name] has history of hypothyroidism and reports daily adherence to levothyroxine. Most recent TSH collected 03-2021 was within normal limits. > Levothyroxine 75ug q24     Diet: NPO VTE: SCDs IVF: LR,100cc/hr Code: Full  Prior to Admission Living Arrangement: Home, living with husband  and adopted son Anticipated Discharge Location:  pending Barriers to Discharge: medical management, diagnostic workup  Dispo: Admit [name] to Observation with expected length of stay less than 2 midnights.  Signed: Sundai Probert,Serita Butcherternal Medicine Resident PGY-1  09/27/2022, 6:51 PM

## 2022-09-27 NOTE — ED Notes (Signed)
Pt transported to CT ?

## 2022-09-27 NOTE — ED Notes (Signed)
ED TO INPATIENT HANDOFF REPORT  ED Nurse Name and Phone #: [phone]  S Name/Age/Gender Regina [name] 89 y.o. female Room/Bed: 009C/009C  Code Status   Code Status: Full Code  Home/SNF/Other Home Patient oriented to: self, place, time, and situation Is this baseline? Yes   Triage Complete: Triage complete  Chief Complaint Acute colitis [K52.9]  Triage Note Pt bib EMS coming from home. Pt reports 6 days of constipation, abdominal pain and hemorrhoids . Last BM today had bright red blood. Pt reports headache and nausea at this time. Denies blood thinner use.   Allergies Allergies  Allergen Reactions   Tape Other (See Comments)    TAPE PULLS OFF THE SKIN, SO PLEASE USE AN ALTERNATIVE   Penicillins Hives and Itching   Latex Rash and Other (See Comments)    NO Band-Aids!!    Level of Care/Admitting Diagnosis ED Disposition     ED Disposition  Admit   Condition  --   Comment  Hospital Area: [place] [[id number]]  Level of Care: Telemetry Medical [104]  May place patient in observation at [facility] or [place] if equivalent level of care is available:: No  Covid Evaluation: Asymptomatic - no recent exposure (last 10 days) testing not required  Diagnosis: Acute colitis [id number]  Admitting Physician: Aldine Contes [id number]  Attending Physician: Aldine Contes [id number]          B Medical/Surgery History Past Medical History:  Diagnosis Date   Allergy    Barrett esophagus 08/12/2008   Cancer ([place])    GERD (gastroesophageal reflux disease)    Hypertension    Thyroid disease    UTI (urinary tract infection)    Past Surgical History:  Procedure Laterality Date   APPENDECTOMY     COLON SURGERY     COLONOSCOPY     COLONOSCOPY N/A 08/03/2014   Procedure: COLONOSCOPY;  Surgeon: Inda Castle, MD;  Location: [facility] END[facility]COPY;  Service: Endoscopy;  Laterality: N/A;   POLYPECTOMY     TONSILLECTOMY     TOTAL THYROIDECTOMY     TUBAL  LIGATION       A IV Location/Drains/Wounds Patient Lines/Drains/Airways Status     Active Line/Drains/Airways     Name Placement date Placement time Site Days   Peripheral IV 03/24/22 20 G Left;Posterior Forearm 03/24/22  1800  Forearm  187   Peripheral IV 09/27/22 20 G Right Forearm 09/27/22  1156  Forearm  less than 1   Wound / Incision (Open or Dehisced) 08/02/14 Laceration Leg Right 08/02/14  0844  Leg  2978   Wound / Incision (Open or Dehisced) 08/02/14 Other (Comment) Leg Left 08/02/14  0846  Leg  2978            Intake/Output Last 24 hours No intake or output data in the 24 hours ending 09/27/22 1627  Labs/Imaging Results for orders placed or performed during the hospital encounter of 09/27/22 (from the past 48 hour(s))  CBC with Differential     Status: Abnormal   Collection Time: 09/27/22 11:50 AM  Result Value Ref Range   WBC 12.6 (H) 4.0 - 10.5 K/uL   RBC 3.74 (L) 3.87 - 5.11 MIL/uL   Hemoglobin 12.1 12.0 - 15.0 g/dL   HCT 37.5 36.0 - 46.0 %   MCV 100.3 (H) 80.0 - 100.0 fL   MCH 32.4 26.0 - 34.0 pg   MCHC 32.3 30.0 - 36.0 g/dL   RDW 12.6 11.5 - 15.5 %  Platelets 165 150 - 400 K/uL   nRBC 0.0 0.0 - 0.2 %   Neutrophils Relative % 77 %   Neutro Abs 9.7 (H) 1.7 - 7.7 K/uL   Lymphocytes Relative 13 %   Lymphs Abs 1.7 0.7 - 4.0 K/uL   Monocytes Relative 9 %   Monocytes Absolute 1.2 (H) 0.1 - 1.0 K/uL   Eosinophils Relative 0 %   Eosinophils Absolute 0.0 0.0 - 0.5 K/uL   Basophils Relative 1 %   Basophils Absolute 0.1 0.0 - 0.1 K/uL   Immature Granulocytes 0 %   Abs Immature Granulocytes 0.05 0.00 - 0.07 K/uL    Comment: Performed at [facility] [address] [place], [place] [address]  Comprehensive metabolic panel     Status: Abnormal   Collection Time: 09/27/22 11:50 AM  Result Value Ref Range   Sodium 137 135 - 145 mmol/L   Potassium 4.1 3.5 - 5.1 mmol/L   Chloride 105 98 - 111 mmol/L   CO2 21 (L) 22 - 32 mmol/L   Glucose, Bld 125 (H) 70 -  99 mg/dL    Comment: Glucose reference range applies only to samples taken after fasting for at least 8 hours.   BUN 28 (H) 8 - 23 mg/dL   Creatinine, Ser 1.72 (H) 0.44 - 1.00 mg/dL   Calcium 8.8 (L) 8.9 - 10.3 mg/dL   Total Protein 5.7 (L) 6.5 - 8.1 g/dL   Albumin 3.1 (L) 3.5 - 5.0 g/dL   AST 23 15 - 41 U/L   ALT 5 0 - 44 U/L    Comment: REPEATED TO VERIFY   Alkaline Phosphatase 56 38 - 126 U/L   Total Bilirubin 0.7 0.3 - 1.2 mg/dL   GFR, Estimated 28 (L) >60 mL/min    Comment: (NOTE) Calculated using the CKD-EPI Creatinine Equation (2021)    Anion gap 11 5 - 15    Comment: Performed at [facility] [address], [place], [place] [address]  Lipase, blood     Status: None   Collection Time: 09/27/22 11:50 AM  Result Value Ref Range   Lipase 29 11 - 51 U/L    Comment: Performed at [facility] [address] [place], [place] [address]  Protime-INR     Status: None   Collection Time: 09/27/22 11:50 AM  Result Value Ref Range   Prothrombin Time 14.5 11.4 - 15.2 seconds   INR 1.1 0.8 - 1.2    Comment: (NOTE) INR goal varies based on device and disease states. Performed at [facility], [place] [address], [place], [place] [address]    CT Abdomen Pelvis W Contrast  Result Date: 09/27/2022 CLINICAL DATA:  Abdominal pain. Constipation for 6 days. Hemorrhoids. Last bowel movement had bright red blood EXAM: CT ABDOMEN AND PELVIS WITH CONTRAST TECHNIQUE: Multidetector CT imaging of the abdomen and pelvis was performed using the standard protocol following bolus administration of intravenous contrast. RADIATION DOSE REDUCTION: This exam was performed according to the departmental dose-optimization program which includes automated exposure control, adjustment of the mA and/or kV according to patient size and/or use of iterative reconstruction technique. CONTRAST:  10m OMNIPAQUE IOHEXOL 350 MG/ML SOLN COMPARISON:  None Available. FINDINGS: Lower chest: There is some linear  opacity at the lung bases, right-greater-than-left, likely atelectasis or scar. No pleural effusion. Some breathing motion. Hepatobiliary: Diffuse fatty liver infiltration identified. 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 Nonspecific. No separate space-occupying liver lesion. Gallbladder is nondilated. Pancreas: Mild pancreatic atrophy without obvious mass or ductal dilatation. Spleen: Lobular spleen but nonenlarged and no abnormal enhancement. Adrenals/Urinary Tract: Right adrenal gland is preserved. The left is slightly thickened, nonspecific. Mild bilateral renal atrophy. No enhancing renal mass or collecting system dilatation. Prominent renal sinus fat. The uterus has a normal course and caliber down to the bladder. The bladder is with a preserved contour. Only slight wall thickening, nonspecific. Bladder is underdistended. Stomach/Bowel: Long segment wall thickening along the colon involving the ascending colon, transverse colon and proximal descending with stranding. Please correlate for a diffuse colitis. Stranding is greatest along the ascending colon. No obstruction or dilatation. The stomach is nondilated. Small bowel is nondilated. Vascular/Lymphatic: Diffuse vascular calcifications along the aorta and branch vessels. Aorta is nondilated. Significant plaque particularly along the renal arteries. Please correlate for any level of hypertension. Reproductive: Uterus and bilateral adnexa are unremarkable. Other: Slight anasarca. No significant anterior abdominal or pelvic wall hernia. No frank ascites. Musculoskeletal: Curvature of the spine with advanced degenerative changes. Also scattered degenerative changes of the pelvis. IMPRESSION: Long segment colitis involving ascending, transverse and proximal descending colon. Favor infectious or inflammatory process. Mesenteric stranding. No obstruction or free air. Nonspecific right hepatic lobe portal to hepatic vein shunt.  Scattered vascular calcifications particularly involving the renal arteries and aorta. Please correlate for level hypertension. Slight wall thickening of the underdistended urinary bladder. Electronically Signed   By: Jill Side M.D.   On: 09/27/2022 12:53    Pending Labs Unresulted Labs (From admission, onward)     Start     Ordered   09/28/22 [id number]  Basic metabolic panel  Tomorrow morning,   R        09/27/22 1619   09/28/22 0500  CBC  Tomorrow morning,   R        09/27/22 1619   09/28/22 0500  Protime-INR  Tomorrow morning,   R        09/27/22 1619   09/28/22 0500  APTT  Tomorrow morning,   R        09/27/22 1619   09/27/22 1622  C Difficile Quick Screen w PCR reflex  (C Difficile quick screen w PCR reflex panel )  Once, for 24 hours,   TIMED       References:    CDiff Information Tool   09/27/22 1621   09/27/22 1621  Gastrointestinal Panel by PCR , Stool  (Gastrointestinal Panel by PCR, Stool                                                                                                                                                     **Does Not include CLOSTRIDIUM DIFFICILE testing. **If CDIFF testing is needed, place order from the "C Difficile Testing" order  set.**)  Once,   R        09/27/22 1621   09/27/22 1619  Brain natriuretic peptide  Once,   R        09/27/22 1619   09/27/22 1135  Urinalysis, Routine w reflex microscopic -Urine, Clean Catch  (ED Abdominal Pain)  Once,   URGENT       Question:  Specimen Source  Answer:  Urine, Clean Catch   09/27/22 1135            Vitals/Pain Today's Vitals   09/27/22 1140 09/27/22 1146 09/27/22 1600  BP: (!) 129/53 (!) 130/47 (!) 155/54  Pulse:  62 65  Resp:  16   Temp:  98.5 F (36.9 C)   TempSrc:  Oral   SpO2:  96% 100%    Isolation Precautions Enteric precautions (UV disinfection)  Medications Medications  cefTRIAXone (ROCEPHIN) 2 g in sodium chloride 0.9 % 100 mL IVPB (0 g Intravenous Stopped 09/27/22 1611)     And  metroNIDAZOLE (FLAGYL) IVPB 500 mg (500 mg Intravenous New Bag/Given 09/27/22 1608)  cefTRIAXone (ROCEPHIN) 1 g in sodium chloride 0.9 % 100 mL IVPB (has no administration in time range)    And  metroNIDAZOLE (FLAGYL) IVPB 500 mg (has no administration in time range)  acetaminophen (TYLENOL) tablet 650 mg (has no administration in time range)    Or  acetaminophen (TYLENOL) suppository 650 mg (has no administration in time range)  iohexol (OMNIPAQUE) 350 MG/ML injection 75 mL (75 mLs Intravenous Contrast Given 09/27/22 1242)  sodium chloride 0.9 % bolus 500 mL (500 mLs Intravenous New Bag/Given 09/27/22 1531)    Mobility Have not assessed     Focused Assessments     R Recommendations: See Admitting Provider Note  Report given to:   Additional Notes:

## 2022-09-27 NOTE — Hospital Course (Signed)
Parkinson's dementia  Abdominal discomfort, constipation -using magnesium at home  Had BRBPR Generalized abdom pain  Diffuse colitis On rocephin, flagyl  AKI -contrast given prior to CMP -giving IVF

## 2022-09-27 NOTE — ED Notes (Signed)
No need for in and out. Patient was able to pee on her own

## 2022-09-28 DIAGNOSIS — K529 Noninfective gastroenteritis and colitis, unspecified: Secondary | ICD-10-CM

## 2022-09-28 DIAGNOSIS — Z9049 Acquired absence of other specified parts of digestive tract: Secondary | ICD-10-CM | POA: Diagnosis not present

## 2022-09-28 DIAGNOSIS — Z79899 Other long term (current) drug therapy: Secondary | ICD-10-CM | POA: Diagnosis not present

## 2022-09-28 DIAGNOSIS — R269 Unspecified abnormalities of gait and mobility: Secondary | ICD-10-CM | POA: Diagnosis present

## 2022-09-28 DIAGNOSIS — Z825 Family history of asthma and other chronic lower respiratory diseases: Secondary | ICD-10-CM | POA: Diagnosis not present

## 2022-09-28 DIAGNOSIS — D631 Anemia in chronic kidney disease: Secondary | ICD-10-CM | POA: Diagnosis present

## 2022-09-28 DIAGNOSIS — K625 Hemorrhage of anus and rectum: Secondary | ICD-10-CM | POA: Diagnosis not present

## 2022-09-28 DIAGNOSIS — Z8249 Family history of ischemic heart disease and other diseases of the circulatory system: Secondary | ICD-10-CM | POA: Diagnosis not present

## 2022-09-28 DIAGNOSIS — N179 Acute kidney failure, unspecified: Secondary | ICD-10-CM | POA: Diagnosis present

## 2022-09-28 DIAGNOSIS — Z880 Allergy status to penicillin: Secondary | ICD-10-CM | POA: Diagnosis not present

## 2022-09-28 DIAGNOSIS — Z833 Family history of diabetes mellitus: Secondary | ICD-10-CM | POA: Diagnosis not present

## 2022-09-28 DIAGNOSIS — E860 Dehydration: Secondary | ICD-10-CM | POA: Diagnosis present

## 2022-09-28 DIAGNOSIS — R21 Rash and other nonspecific skin eruption: Secondary | ICD-10-CM | POA: Diagnosis not present

## 2022-09-28 DIAGNOSIS — I5032 Chronic diastolic (congestive) heart failure: Secondary | ICD-10-CM | POA: Diagnosis present

## 2022-09-28 DIAGNOSIS — N183 Chronic kidney disease, stage 3 unspecified: Secondary | ICD-10-CM | POA: Diagnosis present

## 2022-09-28 DIAGNOSIS — R4189 Other symptoms and signs involving cognitive functions and awareness: Secondary | ICD-10-CM | POA: Diagnosis present

## 2022-09-28 DIAGNOSIS — K5900 Constipation, unspecified: Secondary | ICD-10-CM | POA: Diagnosis present

## 2022-09-28 DIAGNOSIS — R195 Other fecal abnormalities: Secondary | ICD-10-CM | POA: Diagnosis present

## 2022-09-28 DIAGNOSIS — Z888 Allergy status to other drugs, medicaments and biological substances status: Secondary | ICD-10-CM | POA: Diagnosis not present

## 2022-09-28 DIAGNOSIS — Z85038 Personal history of other malignant neoplasm of large intestine: Secondary | ICD-10-CM | POA: Diagnosis not present

## 2022-09-28 DIAGNOSIS — A09 Infectious gastroenteritis and colitis, unspecified: Secondary | ICD-10-CM | POA: Diagnosis present

## 2022-09-28 DIAGNOSIS — Z90710 Acquired absence of both cervix and uterus: Secondary | ICD-10-CM | POA: Diagnosis not present

## 2022-09-28 DIAGNOSIS — I130 Hypertensive heart and chronic kidney disease with heart failure and stage 1 through stage 4 chronic kidney disease, or unspecified chronic kidney disease: Secondary | ICD-10-CM | POA: Diagnosis present

## 2022-09-28 DIAGNOSIS — E039 Hypothyroidism, unspecified: Secondary | ICD-10-CM | POA: Diagnosis present

## 2022-09-28 DIAGNOSIS — G20A1 Parkinson's disease without dyskinesia, without mention of fluctuations: Secondary | ICD-10-CM | POA: Diagnosis present

## 2022-09-28 DIAGNOSIS — Z91040 Latex allergy status: Secondary | ICD-10-CM | POA: Diagnosis not present

## 2022-09-28 LAB — BASIC METABOLIC PANEL WITH GFR
Anion gap: 9 (ref 5–15)
BUN: 21 mg/dL (ref 8–23)
CO2: 21 mmol/L — ABNORMAL LOW (ref 22–32)
Calcium: 8.1 mg/dL — ABNORMAL LOW (ref 8.9–10.3)
Chloride: 105 mmol/L (ref 98–111)
Creatinine, Ser: 1.22 mg/dL — ABNORMAL HIGH (ref 0.44–1.00)
GFR, Estimated: 43 mL/min — ABNORMAL LOW
Glucose, Bld: 90 mg/dL (ref 70–99)
Potassium: 3.6 mmol/L (ref 3.5–5.1)
Sodium: 135 mmol/L (ref 135–145)

## 2022-09-28 LAB — PROTIME-INR
INR: 1.3 — ABNORMAL HIGH (ref 0.8–1.2)
Prothrombin Time: 15.7 s — ABNORMAL HIGH (ref 11.4–15.2)

## 2022-09-28 LAB — CBC
HCT: 31.3 % — ABNORMAL LOW (ref 36.0–46.0)
Hemoglobin: 10.3 g/dL — ABNORMAL LOW (ref 12.0–15.0)
MCH: 32.6 pg (ref 26.0–34.0)
MCHC: 32.9 g/dL (ref 30.0–36.0)
MCV: 99.1 fL (ref 80.0–100.0)
Platelets: 130 K/uL — ABNORMAL LOW (ref 150–400)
RBC: 3.16 MIL/uL — ABNORMAL LOW (ref 3.87–5.11)
RDW: 12.6 % (ref 11.5–15.5)
WBC: 13.7 K/uL — ABNORMAL HIGH (ref 4.0–10.5)
nRBC: 0 % (ref 0.0–0.2)

## 2022-09-28 LAB — GLUCOSE, CAPILLARY: Glucose-Capillary: 80 mg/dL (ref 70–99)

## 2022-09-28 LAB — APTT: aPTT: 31 s (ref 24–36)

## 2022-09-28 MED ORDER — DIPHENHYDRAMINE-ZINC ACETATE 2-0.1 % EX CREA
TOPICAL_CREAM | Freq: Every day | CUTANEOUS | Status: DC | PRN
  Filled 2022-09-28 (×2): qty 28

## 2022-09-28 MED ORDER — CARVEDILOL 3.125 MG PO TABS
3.1250 mg | ORAL_TABLET | Freq: Two times a day (BID) | ORAL | Status: DC
  Administered 2022-09-28 – 2022-09-30 (×5): 3.125 mg via ORAL
  Filled 2022-09-28 (×5): qty 1

## 2022-09-28 NOTE — Evaluation (Signed)
Physical Therapy Evaluation Patient Details Name: Regina [name] MRN: [id number] DOB: 19-Mar-1933 Today's Date: 09/28/2022  History of Present Illness  Pt is 89 yo female presenting via EMS due to 6 days of constipation, abdominal pain and hemorrhoids. Pt also reports headache and nausea. Diagnosis acute colitis. PMH: Barrett esophagus, cancer (colon), GERD, HTN, Thyroid disease, UTI, memory impairment.  Clinical Impression  Pt presents close to PLOF. Currently pt is Min A for all functional mobility. Unable to progress gait today due to bowel status. Pt reports multiple falls in the past 6 months. Due to pt current functional status, PLOF, home set up and available assistance at home recommending skilled physical therapy services in HHPT setting on discharge from acute care hospital in order to decrease risk for falls, injury, immobility and re-hospitalization. No cardiac/respiratory distress noted throughout session. Extra time needed for all activities.        Recommendations for follow up therapy are one component of a multi-disciplinary discharge planning process, led by the attending physician.  Recommendations may be updated based on patient status, additional functional criteria and insurance authorization.  Follow Up Recommendations Home health PT      Assistance Recommended at Discharge Intermittent Supervision/Assistance  Patient can return home with the following  A little help with walking and/or transfers;Assist for transportation;Help with stairs or ramp for entrance;Assistance with cooking/housework    Equipment Recommendations Rolling walker (2 wheels)  Recommendations for Other Services       Functional Status Assessment Patient has had a recent decline in their functional status and demonstrates the ability to make significant improvements in function in a reasonable and predictable amount of time.     Precautions / Restrictions        Mobility  Bed Mobility Overal  bed mobility: Needs Assistance Bed Mobility: Supine to Sit, Sit to Supine     Supine to sit: Min assist Sit to supine: Min assist   General bed mobility comments: Min A with verbal/tactile cues for sequencing and to scoot toward EOB for supine to sitting. Min A for bil LE for sitting to supine. Patient Response: Cooperative  Transfers Overall transfer level: Needs assistance Equipment used: Rolling walker (2 wheels) Transfers: Sit to/from Stand Sit to Stand: Min assist           General transfer comment: Pt had BM on standing able to stand with very minimal assistance. Requires verbal cues for safe hand placement.    Ambulation/Gait               General Gait Details: Pt was able to take some side steps at EOB with RW at [place]. Gait was deferred further at this time due to pt began defacating on standing and it was no resolving. Pt was cleaned as much as possible and returned to bed. MD and nursing were notified.       Balance Overall balance assessment: Needs assistance Sitting-balance support: Bilateral upper extremity supported Sitting balance-Leahy Scale: Good Sitting balance - Comments: no overt LOB   Standing balance support: Bilateral upper extremity supported, Single extremity supported Standing balance-Leahy Scale: Fair Standing balance comment: No overt LOB pt was able to stand with bil and unilateral UE support. Min A due to sway and narrow BOS           Pertinent Vitals/Pain Pain Assessment Pain Assessment: No/denies pain    Home Living Family/patient expects to be discharged to:: Private residence Living Arrangements: Spouse/significant other Available Help at Discharge: Available  24 hours/day Type of Home: House Home Access: Stairs to enter Entrance Stairs-Rails: None Entrance Stairs-Number of Steps: 2   Home Layout: One level Home Equipment: Rollator (4 wheels);Shower seat;Grab bars - tub/shower Additional Comments: Pt has history of  memory impairment. Pt answered all questions that correlated with previous history of home set up.    Prior Function Prior Level of Function : Needs assist;History of Falls (last six months)             Mobility Comments: Pt spouse would walk with pt. She states when she gets to hot she "flakes out.". Pt states "I fall all the time." ADLs Comments: spouse was assisting with donning bra as well as help with drying her off after shower and putting on the lotion.     Hand Dominance   Dominant Hand: Right    Extremity/Trunk Assessment   Upper Extremity Assessment Upper Extremity Assessment: Generalized weakness    Lower Extremity Assessment Lower Extremity Assessment: Generalized weakness    Cervical / Trunk Assessment Cervical / Trunk Assessment: Normal  Communication   Communication: HOH  Cognition Arousal/Alertness: Lethargic Behavior During Therapy: WFL for tasks assessed/performed Overall Cognitive Status: History of cognitive impairments - at baseline       General Comments: Pt was able to answer orientation questions and home set up without difficulty.        General Comments General comments (skin integrity, edema, etc.): Pt spouse was assisting some prior to hospitalization with mobility and ADL's. Pt appears close to baseline but due to mutliple falls will benefit from HHPT.        Assessment/Plan    PT Assessment Patient needs continued PT services  PT Problem List Decreased strength;Decreased balance;Decreased mobility       PT Treatment Interventions DME instruction;Balance training;Functional mobility training;Patient/family education;Gait training;Therapeutic activities;Neuromuscular re-education;Stair training;Therapeutic exercise;Manual techniques    PT Goals (Current goals can be found in the Care Plan section)  Acute Rehab PT Goals Patient Stated Goal: to go home PT Goal Formulation: With patient Time For Goal Achievement: 10/12/22 Potential  to Achieve Goals: Fair    Frequency Min 3X/week        AM-PAC PT "6 Clicks" Mobility  Outcome Measure Help needed turning from your back to your side while in a flat bed without using bedrails?: A Little Help needed moving from lying on your back to sitting on the side of a flat bed without using bedrails?: A Little Help needed moving to and from a bed to a chair (including a wheelchair)?: A Little Help needed standing up from a chair using your arms (e.g., wheelchair or bedside chair)?: A Little Help needed to walk in hospital room?: A Little Help needed climbing 3-5 steps with a railing? : A Little 6 Click Score: 18    End of Session Equipment Utilized During Treatment: Gait belt Activity Tolerance: Patient tolerated treatment well Patient left: in bed;with call bell/phone within reach;with bed alarm set Nurse Communication: Mobility status PT Visit Diagnosis: Unsteadiness on feet (R26.81);Other abnormalities of gait and mobility (R26.89);History of falling (Z91.81);Muscle weakness (generalized) (M62.81)    Time: [id number] PT Time Calculation (min) (ACUTE ONLY): 31 min   Charges:   PT Evaluation $PT Eval Low Complexity: 1 Low PT Treatments $Therapeutic Activity: 8-22 mins       Tomma Rakers, DPT, CLT  Acute Rehabilitation Services Office: [phone] (Secure chat preferred)   Ander Purpura 09/28/2022, 1:55 PM

## 2022-09-28 NOTE — Progress Notes (Signed)
Subjective:  Regina [name] reports feeling okay this morning, hungry given limited food intake over last two days. Denies producing any bowel movements since last night. Discussed plan to continue antibiotics and monitor for bleeding. Patient expressed understanding of and agreement with this plan.    Objective: Vitals over previous 24hr: Vitals:   09/27/22 2025 09/28/22 0358 09/28/22 0500 09/28/22 0849  BP: (!) 160/55 (!) 158/56  (!) 140/54  Pulse: 69 80  67  Resp: 17 14  16  $ Temp: 98.9 F (37.2 C) 98.9 F (37.2 C)  98 F (36.7 C)  TempSrc: Oral   Oral  SpO2: 98% 97%  98%  Weight:   59.1 kg     General:                       awake and alert, lying in bed, cooperative, not in acute distress Skin:                             warm and dry, no rashes Lungs:                          normal respiratory effort, breathing unlabored, symmetrical chest rise Cardiac:                        regular rate and rhythm, normal S1 and S2, no pitting edema Abdomen:                     soft and non-distended, hyperactive bowel sounds, generalized tenderness to palpation, no guarding or rigidity Neurologic:                   oriented to person-place-time, moving all extremities, no gross focal deficits Psychiatric:                   euthymic mood with congruent affect, intelligible speech    Assessment/Plan: Regina [name] is an 89 year old female with a history of hypertension, hypothyroidism, chronic diastolic heart failure, chronic kidney disease, and Parkinsonism who presented with bright-red-colored stools, now admitted for management of colitis and further diagnostic workup.     ---Acute long-segment colitis ---History of hemorrhoids Patient presented with five episodes of loose bright-red-colored stools and abdominal pain that started on 2-15. She also reported poor oral intake of food and liquid. Upon arrival, abdominal-pelvic CT revealed long segment colitis involving the ascending through  proximal descending colon. Exam notable for diffuse abdominal tenderness, palpable intrarectal masses, and red blood per rectum. Etiology of bleed most likely colitis, either infectious or inflammatory, hemorrhoids possibly contributing as well. Antibiotics started for Gram-negative and anaerobic coverage, further diagnostic workup is warranted to rule out infectious cause. Gastrointestinal panel and C.difficile orders have been placed. Hemoglobin has remained stable at 10-12 throughout hospitalization. > Gastroenterology consult, appreciate recommendations > Ciprofloxacin 468m q24 > Metronidazole [id number] > Acetaminophen 65046m6 PRN > Ondansetron 4mg15m PRN > Diet clear liquid, advance as tolerated > Check gastrointestinal panel  > Trend CBC q24     ---Acute on chronic kidney disease III Patient has history of chronic kidney disease with GFR values fluctuating between 28 and 59 per electronic medical record. Upon arrival, her creatinine was elevated to 1.72 above approximate baseline of 1.0-1.3. Given poor oral intake and decreased skin turgor on exam, etiology most likely prerenal secondary to  dehydration. Contrast from CT possibly contributing as well. Creatinine returned to baseline after administration of intravenous fluids. > Trend BMP q24     ---Parkinsonism Patient has history of parkinsonism first diagnosed in 07-2020 per electronic medical record. She has memory and gait disturbances at baseline, requires assistance with most daily activities. Currently takes carbidopa-levodopa and donepezil. Physical therapy team is recommending home health upon discharge. > Carbidopa-levodopa 25-165m q8 > Donepezil [id number] > Delirium precautions > Occupational therapy evaluation     ---Chronic diastolic heart failure Patient has history of chronic diastolic heart failure, most recent echocardiogram performed 02-2018 demonstrated EF 60-65 and grade I diastolic dysfunction. She takes carvedilol  at home. Denies breath shortness, orthopnea, and chest pain. Exam negative for signs of volume overload. Low concern for acute heart failure exacerbation, especially given creatinine improvement after fluid administration. > Carvedilol 3.12533m12     ---Hypertension Patient has history of hypertension managed at home with carvedilol daily. Upon arrival, patient was slightly hypertensive. Carvedilol at home dose was added on day two of hospitalization. > Carvedilol 3.125m48m2     ---Left upper extremity rash Patient developed a mildly pruritic rash involving her elbow to wrist on the left upper extremity. Exam notable for erythematous patch without any lesions, papules, pustules, or urticaria. Evidence of rash on her proximal LUE, entire RUE, abdomen, and BLEs was absent. Given focal area of involvement, concern for systemic hypersensitivity reaction is low. Primary team will treat with topical antihistamine and escalate to oral if necessary. > Diphenhydramine-zinc 2-0.1% topical q24 PRN   ---Hypothyroidism Patient has history of hypothyroidism and reports daily adherence to levothyroxine. Most recent TSH collected 03-2021 was within normal limits. > Levothyroxine 75ug q24    Principal Problem:   Acute colitis    Prior to Admission Living Arrangement: home, living with husband and son Anticipated Discharge Location: home with HH-PT Barriers to Discharge: medical management, diagnostic workup Dispo: Anticipated discharge in approximately 1-2 day(s).    Dan Roswell Nickel Internal Medicine PGY-1 Pager [phone] 5pm on weekdays and 1pm on weekends: On Call pager [phone]

## 2022-09-28 NOTE — Consult Note (Signed)
Referring Provider: Dr. Gilles Chiquito Primary Care Physician:  [name], MD Primary Gastroenterologist: [facility]   Reason for Consultation: Infectious colitis vs. less likely ischemic  HPI: Regina [name] is a 89 y.o. female with a past medical history of hypertension, chronic diastolic CHF, CKD, Parkinson's disease, GERD, Barrett's esophagus and colon cancer status post colectomy 1986.  Presented to the ED 09/27/2022 via EMS due to having abdominal pain with bright red blood per the rectum.  She was recently constipated x 4 to 5 days, she did not take any laxatives as reported by her husband. She went to the bathroom today and passed a large amount of bright red blood in the commode and on the floor x 1 then 4 to 4 similar lower volumes bloody diarrhea stools. She is not on any blood thinners. No NSAID use. Labs in the ED showed a WBC count of 12.6.  Hemoglobin 12.1. BUN 28. Creatinine 1.72.  Lactic acid level 1.0. CTAP identified long segment colitis involving the ascending, transverse and proximal descending colon. She was started on Metronidazole and Rocephin IV for suspected infectious colitis. GI pathogen and C. difficile test ordered, not yet collected. GI consult was requested for further evaluation regarding colitis.No active diarrhea or hematochezia overnight. Bed pad this morning was soiled with a small amount or watery brown stool with a few streaks of blood.   She has a history of colon cancer status post resection in 1986. She had followup colonoscopies in 1987, 1990, 1995, 2000 and 2010.  Her most recent colonoscopy by Dr. Maurene Capes was 08/03/2014 which showed internal hemorrhoids otherwise was normal.  History of GERD and Barrett's esophagus. EGD 2005 and 2010 and 2012 no Barrett's. Most recent EGD 03/03/2013 showed a 2 to 3cm non-reducible hiatal hernia.  Esophageal biopsies were consistent with reflux esophagitis without evidence of intestinal metaplasia/Barrett's esophagus. Chronic  gastritis was noted without evidence of H. pylori.   Past Medical History:  Diagnosis Date   Allergy    Barrett esophagus 08/12/2008   Cancer (HCC)    GERD (gastroesophageal reflux disease)    Hypertension    Thyroid disease    UTI (urinary tract infection)     Past Surgical History:  Procedure Laterality Date   APPENDECTOMY     COLON SURGERY     COLONOSCOPY     COLONOSCOPY N/A 08/03/2014   Procedure: COLONOSCOPY;  Surgeon: Inda Castle, MD;  Location: [facility];  Service: Endoscopy;  Laterality: N/A;   POLYPECTOMY     TONSILLECTOMY     TOTAL THYROIDECTOMY     TUBAL LIGATION      Prior to Admission medications   Medication Sig Start Date End Date Taking? Authorizing Provider  acetaminophen (TYLENOL) 325 MG tablet Take 325-650 mg by mouth every 6 (six) hours as needed for mild pain or headache.    [provider]  calcitRIOL (ROCALTROL) 0.25 MCG capsule Take 0.25 mcg by mouth daily.    [provider]  carbidopa-levodopa (SINEMET) 25-100 MG tablet Take 1 tablet by mouth 3 (three) times daily. 07/17/22   Suzzanne Cloud, NP  carvedilol (COREG) 3.125 MG tablet Take 1 tablet (3.125 mg total) by mouth 2 (two) times daily. 04/12/21 04/12/22  Rick Duff, MD  senna-docusate (SENOKOT-S) 8.6-50 MG tablet Take 1 tablet by mouth daily as needed for mild constipation. 04/12/21   Rick Duff, MD  SYNTHROID 75 MCG tablet Take 75 mcg by mouth daily before breakfast.    [provider]  SYSTANE ULTRA PF 0.4-0.3 % SOLN Place 1 drop into both eyes in the morning, at noon, and at bedtime.    [provider]    Current Facility-Administered Medications  Medication Dose Route Frequency Provider Last Rate Last Admin   acetaminophen (TYLENOL) tablet 650 mg  650 mg Oral Q6H PRN Virl Axe, MD       Or   acetaminophen (TYLENOL) suppository 650 mg  650 mg Rectal Q6H PRN Virl Axe, MD       carbidopa-levodopa (SINEMET IR) 25-100 MG per tablet  immediate release 1 tablet  1 tablet Oral TID Virl Axe, MD       carvedilol (COREG) tablet 3.125 mg  3.125 mg Oral BID Virl Axe, MD       ciprofloxacin (CIPRO) IVPB 400 mg  400 mg Intravenous Q24H Virl Axe, MD   Stopped at 09/27/22 2154   donepezil (ARICEPT) tablet 5 mg  5 mg Oral QHS Virl Axe, MD   5 mg at 09/27/22 2249   levothyroxine (SYNTHROID) tablet 75 mcg  75 mcg Oral QAC breakfast Virl Axe, MD   75 mcg at 09/28/22 0529   metroNIDAZOLE (FLAGYL) IVPB 500 mg  500 mg Intravenous Q8H Virl Axe, MD 100 mL/hr at 09/28/22 0217 500 mg at 09/28/22 0217   ondansetron (ZOFRAN) injection 4 mg  4 mg Intravenous Q6H PRN Serita Butcher, MD        Allergies as of 09/27/2022 - Review Complete 09/27/2022  Allergen Reaction Noted   Tape Other (See Comments) 04/09/2021   Penicillins Hives and Itching 10/06/2008   Latex Rash and Other (See Comments) 04/09/2021    Family History  Problem Relation Age of Onset   Heart disease Mother    Heart disease Father    Diabetes Sister    COPD Brother    Diabetes Brother    Parkinson's disease Neg Hx     Social History   Socioeconomic History   Marital status: Married    Spouse name: Jeneen Rinks   Number of children: Not on file   Years of education: Not on file   Highest education level: Not on file  Occupational History   Not on file  Tobacco Use   Smoking status: Never   Smokeless tobacco: Never  Substance and Sexual Activity   Alcohol use: No   Drug use: No   Sexual activity: Not Currently  Other Topics Concern   Not on file  Social History Narrative   Lives with husband and son   Right Handed   Drinks 1-2 cups caffeine daily   Social Determinants of Health   Financial Resource Strain: Not on file  Food Insecurity: Not on file  Transportation Needs: Not on file  Physical Activity: Not on file  Stress: Not on file  Social Connections: Not on file  Intimate Partner Violence: Not on file    Review of  Systems: Gen: Denies fever, sweats or chills. No weight loss.  CV: Denies chest pain, palpitations or edema. Resp: Denies cough, shortness of breath of hemoptysis.  GI: See HPI. No GERD symptoms.  GU : Denies urinary burning, blood in urine, increased urinary frequency or incontinence. MS: Denies joint pain, muscles aches or weakness. Derm: Denies rash, itchiness, skin lesions or unhealing ulcers. Psych: + Memory issues. Heme: Denies easy bruising, bleeding. Neuro:  Denies headaches, dizziness or paresthesias. Endo:  Denies any problems with DM, thyroid or adrenal function.  Physical Exam: Vital signs in last 24 hours: Temp:  [98.4  F (36.9 C)-98.9 F (37.2 C)] 98.9 F (37.2 C) (02/17 0358) Pulse Rate:  [62-80] 80 (02/17 0358) Resp:  [14-17] 14 (02/17 0358) BP: (129-176)/(47-60) 158/56 (02/17 0358) SpO2:  [96 %-100 %] 97 % (02/17 0358) Weight:  [59.1 kg] 59.1 kg (02/17 0500) Last BM Date : 09/27/22 General: 89 year old female in NAD. Head:  Normocephalic and atraumatic. Eyes:  No scleral icterus. Conjunctiva pink. Ears:  Normal auditory acuity. Nose:  No deformity, discharge or lesions. Mouth: No ulcers or lesions.  Neck:  Supple. No lymphadenopathy or thyromegaly.  Lungs: Breath sounds clear throughout. No wheezes, rhonchi or crackles.  Heart:  RRR, no murmurs.  Abdomen:  Soft, nondistended. Mild lower abdominal tenderness without rebound or guarding. Positive bowel sounds x 4 quads. Midline scar intact. Rectal: External hemorrhoids non-inflamed. Bed pad with a small amount of brown watery stool with a few streaks of bright red blood. RN at the bedside at time of exam. Musculoskeletal:  Symmetrical without gross deformities.  Pulses:  Normal pulses noted. Extremities:  Without clubbing or edema. Neurologic:  Alert and  oriented x 2. No focal deficits.  Skin:  Intact without significant lesions or rashes. Psych:  Alert and cooperative. Normal mood and affect.  Intake/Output  from previous day: No intake/output data recorded. Intake/Output this shift: Total I/O In: 547.6 [I.V.:347.6; IV Piggyback:200] Out: -   Lab Results: Recent Labs    09/27/22 1150 09/28/22 0308  WBC 12.6* 13.7*  HGB 12.1 10.3*  HCT 37.5 31.3*  PLT 165 130*   BMET Recent Labs    09/27/22 1150 09/28/22 0308  NA 137 135  K 4.1 3.6  CL 105 105  CO2 21* 21*  GLUCOSE 125* 90  BUN 28* 21  CREATININE 1.72* 1.22*  CALCIUM 8.8* 8.1*   LFT Recent Labs    09/27/22 1150  PROT 5.7*  ALBUMIN 3.1*  AST 23  ALT 5  ALKPHOS 56  BILITOT 0.7   PT/INR Recent Labs    09/27/22 1150 09/28/22 0308  LABPROT 14.5 15.7*  INR 1.1 1.3*   Hepatitis Panel No results for input(s): "HEPBSAG", "HCVAB", "HEPAIGM", "HEPBIGM" in the last 72 hours.    Studies/Results: CT Abdomen Pelvis W Contrast  Result Date: 09/27/2022 CLINICAL DATA:  Abdominal pain. Constipation for 6 days. Hemorrhoids. Last bowel movement had bright red blood EXAM: CT ABDOMEN AND PELVIS WITH CONTRAST TECHNIQUE: Multidetector CT imaging of the abdomen and pelvis was performed using the standard protocol following bolus administration of intravenous contrast. RADIATION DOSE REDUCTION: This exam was performed according to the departmental dose-optimization program which includes automated exposure control, adjustment of the mA and/or kV according to patient size and/or use of iterative reconstruction technique. CONTRAST:  43m OMNIPAQUE IOHEXOL 350 MG/ML SOLN COMPARISON:  None Available. FINDINGS: Lower chest: There is some linear opacity at the lung bases, right-greater-than-left, likely atelectasis or scar. No pleural effusion. Some breathing motion. Hepatobiliary: Diffuse fatty liver infiltration identified. 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 Nonspecific. No separate space-occupying liver lesion. Gallbladder is nondilated. Pancreas: Mild pancreatic atrophy without obvious mass or ductal  dilatation. Spleen: Lobular spleen but nonenlarged and no abnormal enhancement. Adrenals/Urinary Tract: Right adrenal gland is preserved. The left is slightly thickened, nonspecific. Mild bilateral renal atrophy. No enhancing renal mass or collecting system dilatation. Prominent renal sinus fat. The uterus has a normal course and caliber down to the bladder. The bladder is with a preserved contour. Only slight wall  thickening, nonspecific. Bladder is underdistended. Stomach/Bowel: Long segment wall thickening along the colon involving the ascending colon, transverse colon and proximal descending with stranding. Please correlate for a diffuse colitis. Stranding is greatest along the ascending colon. No obstruction or dilatation. The stomach is nondilated. Small bowel is nondilated. Vascular/Lymphatic: Diffuse vascular calcifications along the aorta and branch vessels. Aorta is nondilated. Significant plaque particularly along the renal arteries. Please correlate for any level of hypertension. Reproductive: Uterus and bilateral adnexa are unremarkable. Other: Slight anasarca. No significant anterior abdominal or pelvic wall hernia. No frank ascites. Musculoskeletal: Curvature of the spine with advanced degenerative changes. Also scattered degenerative changes of the pelvis. IMPRESSION: Long segment colitis involving ascending, transverse and proximal descending colon. Favor infectious or inflammatory process. Mesenteric stranding. No obstruction or free air. Nonspecific right hepatic lobe portal to hepatic vein shunt. Scattered vascular calcifications particularly involving the renal arteries and aorta. Please correlate for level hypertension. Slight wall thickening of the underdistended urinary bladder. Electronically Signed   By: Jill Side M.D.   On: 09/27/2022 12:53    IMPRESSION/PLAN:  89 year old female with remote history of colon cancer s/p left me in the 1980s with acute abdominal pain and hematochezia.   WBC 12.6.  Hemoglobin 12.1.  On Cipro and Flagyl IV.  Suspect infectious colitis. -Await C. difficile and GI pathogen panel results, specimen not yet collected. -Clear liquid diet for now -Continue IV antibiotics for now -IV fluids and pain management per the hospitalist -No recommendations for colonoscopy at this time -Await further recommendations per Dr. Havery Moros  Remote history of GERD and Barrett's esophagus.  Last EGD in 2014 showed evidence of acid reflux without testing metaplasia.  Parkinson's disease cognitive impairment  Diastolic CHF  CKD stage III    Noralyn Pick  09/28/2022, 9:17AM

## 2022-09-29 DIAGNOSIS — K529 Noninfective gastroenteritis and colitis, unspecified: Secondary | ICD-10-CM | POA: Diagnosis not present

## 2022-09-29 LAB — BASIC METABOLIC PANEL
Anion gap: 8 (ref 5–15)
BUN: 13 mg/dL (ref 8–23)
CO2: 23 mmol/L (ref 22–32)
Calcium: 8.1 mg/dL — ABNORMAL LOW (ref 8.9–10.3)
Chloride: 103 mmol/L (ref 98–111)
Creatinine, Ser: 1.1 mg/dL — ABNORMAL HIGH (ref 0.44–1.00)
GFR, Estimated: 49 mL/min — ABNORMAL LOW (ref >60)
Glucose, Bld: 91 mg/dL (ref 70–99)
Potassium: 3.6 mmol/L (ref 3.5–5.1)
Sodium: 134 mmol/L — ABNORMAL LOW (ref 135–145)

## 2022-09-29 LAB — CBC
HCT: 28.1 % — ABNORMAL LOW (ref 36.0–46.0)
Hemoglobin: 9.6 g/dL — ABNORMAL LOW (ref 12.0–15.0)
MCH: 33 pg (ref 26.0–34.0)
MCHC: 34.2 g/dL (ref 30.0–36.0)
MCV: 96.6 fL (ref 80.0–100.0)
Platelets: 128 10*3/uL — ABNORMAL LOW (ref 150–400)
RBC: 2.91 MIL/uL — ABNORMAL LOW (ref 3.87–5.11)
RDW: 12.4 % (ref 11.5–15.5)
WBC: 14 10*3/uL — ABNORMAL HIGH (ref 4.0–10.5)
nRBC: 0 % (ref 0.0–0.2)

## 2022-09-29 LAB — GASTROINTESTINAL PANEL BY PCR, STOOL (REPLACES STOOL CULTURE)

## 2022-09-29 LAB — C DIFFICILE QUICK SCREEN W PCR REFLEX
C Diff antigen: NEGATIVE
C Diff interpretation: NOT DETECTED
C Diff toxin: NEGATIVE

## 2022-09-29 LAB — GLUCOSE, CAPILLARY: Glucose-Capillary: 119 mg/dL — ABNORMAL HIGH (ref 70–99)

## 2022-09-29 MED ORDER — SODIUM CHLORIDE 0.9 % IV SOLN
500.0000 mg | INTRAVENOUS | Status: DC
  Administered 2022-09-29: 500 mg via INTRAVENOUS
  Filled 2022-09-29 (×2): qty 5

## 2022-09-29 MED ORDER — DIPHENHYDRAMINE-ZINC ACETATE 2-0.1 % EX CREA: TOPICAL_CREAM | Freq: Three times a day (TID) | CUTANEOUS | Status: DC | PRN

## 2022-09-29 NOTE — Plan of Care (Signed)
  Problem: Education: Goal: Knowledge of General Education information will improve Description: Including pain rating scale, medication(s)/side effects and non-pharmacologic comfort measures Outcome: Progressing   Problem: Clinical Measurements: Goal: Will remain free from infection Outcome: Progressing Goal: Diagnostic test results will improve Outcome: Progressing Goal: Respiratory complications will improve Outcome: Progressing   

## 2022-09-29 NOTE — Progress Notes (Signed)
PIV consult: pt with erythema to L posterior forearm and ears. She reports she is "itching all over from the antibiotic". RN made aware.

## 2022-09-29 NOTE — Progress Notes (Signed)
Progress Note   Subjective  Patient states she is not having too much abdominal pain.  She is continuing to have some loose stools which can often happen when she urinates.  She is not aware of any overt bleeding.  Hemoglobin has down trended.   Objective   Vital signs in last 24 hours: Temp:  [97.6 F (36.4 C)-98.5 F (36.9 C)] 97.8 F (36.6 C) (02/18 0900) Pulse Rate:  [62-70] 67 (02/18 0900) Resp:  [16-18] 18 (02/18 0900) BP: (142-153)/(45-53) 142/53 (02/18 0900) SpO2:  [99 %-100 %] 100 % (02/18 0900) Last BM Date : 09/28/22 General:    white female in NAD Abdomen:  Soft, nontender and nondistended.  Neurologic:  Alert and oriented,  grossly normal neurologically. Psych:  Cooperative. Normal mood and affect.  Intake/Output from previous day: 02/17 0701 - 02/18 0700 In: 1787.6 [P.O.:640; I.V.:347.6; IV Piggyback:800] Out: 700 [Urine:700] Intake/Output this shift: No intake/output data recorded.  Lab Results: Recent Labs    09/27/22 1150 09/28/22 0308 09/29/22 0315  WBC 12.6* 13.7* 14.0*  HGB 12.1 10.3* 9.6*  HCT 37.5 31.3* 28.1*  PLT 165 130* 128*   BMET Recent Labs    09/27/22 1150 09/28/22 0308 09/29/22 0315  NA 137 135 134*  K 4.1 3.6 3.6  CL 105 105 103  CO2 21* 21* 23  GLUCOSE 125* 90 91  BUN 28* 21 13  CREATININE 1.72* 1.22* 1.10*  CALCIUM 8.8* 8.1* 8.1*   LFT Recent Labs    09/27/22 1150  PROT 5.7*  ALBUMIN 3.1*  AST 23  ALT 5  ALKPHOS 56  BILITOT 0.7   PT/INR Recent Labs    09/27/22 1150 09/28/22 0308  LABPROT 14.5 15.7*  INR 1.1 1.3*    Studies/Results: No results found.     Assessment / Plan:    89 year old female here with the following:  Colitis Rectal bleeding Anemia History of colon cancer  As above, presented with abdominal pain and hematochezia.  CT scan shows long segment of colitis from the ascending colon through proximal descending colon.  I again reviewed differential diagnosis with her.  I  suspect this is most likely infectious versus less likely ischemic.  IBD would seem much less likely.  GI pathogen panel and C. difficile has been sent and unfortunately remains pending, not back yet.  The patient continues to have loose stools with urgency although her pain is improved  Hemoglobin has drifted.  She is tolerating liquids, hungry and wants to eat something.  I think that is okay to advance her diet today.  She really does not want to have a colonoscopy unless this persists and she fails to improve, I think that is reasonable.  I suspect this will improve with time if infectious or ischemic.  She is on empiric antibiotics, if she test positive for C. difficile, this will need to be tailored to treat that.  Hopefully she improves with time and conservative measures, if not or she worsens, please contact us but holding off on any invasive evaluation at this time.  PLAN: - advance to soft diet - await stool for C diff and GI pathogen panel - continue empiric antibiotics - trend Hgb - monitor for recurrent / persistent bleeding   Will sign off for now assuming she will continue to improve with time, if not please contact us in the upcoming days or with changes in her status. She can otherwise follow up with [place] as outpatient following her  discharge.  Jolly Mango, MD [facility]

## 2022-09-29 NOTE — Progress Notes (Signed)
Subjective:  Ms. Bjorkman reports feeling okay this morning. She continues to feel like she needs to pass a BM but only having small output with mucus production. She continues to have itching on left arm around PIV site, thinks it may be from antibiotics.    Objective: Vitals over previous 24hr: Vitals:   09/28/22 1658 09/28/22 2030 09/29/22 0543 09/29/22 0900  BP: (!) 145/52 (!) 149/52 (!) 153/45 (!) 142/53  Pulse: 70 62 63 67  Resp: 16 18 18 18  $ Temp: 97.8 F (36.6 C) 97.6 F (36.4 C) 98.5 F (36.9 C) 97.8 F (36.6 C)  TempSrc: Oral Oral Oral Oral  SpO2: 100% 100% 99% 100%  Weight:        General: elderly female, laying in bed, NAD. CV: normal rate and regular rhythm, no m/r/g. Pulm: CTABL, normal WOB on RA. Abdomen: soft, nondistended. TTP, primarily in epigastric region. Hyperactive bowel sounds. No guarding or rigidity. MSK: no peripheral edema noted. Skin: warm and dry. Erythema and mild excoriations noted on left arm around PIV site. Neuro: AAOx3, no focal deficits.  Assessment/Plan: Ms Apollo is an 89 year old female with a history of hypertension, hypothyroidism, chronic diastolic heart failure, chronic kidney disease, and Parkinsonism who presented with bright-red-colored stools, now admitted for management of colitis and further diagnostic workup.     ---Acute long-segment colitis ---History of hemorrhoids Patient presented with five episodes of loose bright-red-colored stools and abdominal pain that started on 2-15. She also reported poor oral intake of food and liquid. Upon arrival, abdominal-pelvic CT revealed long segment colitis involving the ascending through proximal descending colon. Etiology of bleed most likely colitis, either infectious or inflammatory, hemorrhoids possibly contributing as well. Antibiotics started for Gram-negative and anaerobic coverage, further diagnostic workup is warranted to rule out infectious cause. Gastrointestinal panel and C.difficile  orders have been placed, collected today. She received 2 days of ciprofloxacin and flagyl. Will change ciprofloxacin to azithromycin to see if this may be cause of pruritus. Hemoglobin trending down since admission (12.1 > 10.3 > 9.6). > Gastroenterology consult, appreciate recommendations > changed from cipro to azithromycin IV 561m daily > Metronidazole [id number] > Acetaminophen 6509m6 PRN > Ondansetron 4mg68m PRN > Diet clear liquid, advance as tolerated > F/u GI panel and C diff testing > Trend CBC daily   ---Acute on chronic kidney disease III Patient has history of chronic kidney disease with GFR values fluctuating between 28 and 59 per electronic medical record. Upon arrival, her creatinine was elevated to 1.72 above approximate baseline of 1.0-1.3. Given poor oral intake and decreased skin turgor on exam, etiology most likely prerenal secondary to dehydration. Contrast from CT possibly contributing as well. Creatinine returned to baseline after administration of intravenous fluids. > intermittently trend kidney function   ---Parkinsonism Patient has history of parkinsonism first diagnosed in 07-2020 per electronic medical record. She has memory and gait disturbances at baseline, requires assistance with most daily activities. Currently takes carbidopa-levodopa and donepezil. Physical therapy team is recommending home health upon discharge. > Carbidopa-levodopa 25-100mg30m> Donepezil 5mg q66m> Delirium precautions > Occupational therapy evaluation   ---Chronic diastolic heart failure ---Hypertension Patient has history of chronic diastolic heart failure, most recent echocardiogram performed 02-2018 demonstrated EF 60-65 and grade I diastolic dysfunction. She takes carvedilol at home. Denies breath shortness, orthopnea, and chest pain. Low concern for acute heart failure exacerbation, especially given creatinine improvement after fluid administration. Euvolemic on exam. > Carvedilol  3.125mg q40m  ---  Left upper extremity rash Patient developed a mildly pruritic rash involving her elbow to wrist on the left upper extremity. Exam notable for erythematous patch without any lesions, papules, pustules, or urticaria. Given focal area of involvement, concern for systemic hypersensitivity reaction is low. Will treat with topical antihistamine. > Diphenhydramine-zinc 2-0.1% topical TID PRN > switched ciprofloxacin to azithromycin   ---Hypothyroidism Patient has history of hypothyroidism and reports daily adherence to levothyroxine. Most recent TSH collected 03-2021 was within normal limits. > Levothyroxine 75ug q24   Principal Problem:   Acute colitis    Prior to Admission Living Arrangement: home, living with husband and son Anticipated Discharge Location: home with HH-PT Barriers to Discharge: medical management, diagnostic workup Dispo: Anticipated discharge in approximately 1-2 day(s).    Virl Axe, MD Internal Medicine PGY-3 Pager [phone]  After 5pm on weekdays and 1pm on weekends: On Call pager [phone]

## 2022-09-30 ENCOUNTER — Telehealth: Payer: Self-pay | Admitting: *Deleted

## 2022-09-30 ENCOUNTER — Other Ambulatory Visit: Payer: Self-pay | Admitting: Student

## 2022-09-30 DIAGNOSIS — K529 Noninfective gastroenteritis and colitis, unspecified: Secondary | ICD-10-CM | POA: Diagnosis not present

## 2022-09-30 LAB — CBC
HCT: 26.8 % — ABNORMAL LOW (ref 36.0–46.0)
Hemoglobin: 9.3 g/dL — ABNORMAL LOW (ref 12.0–15.0)
MCH: 33.3 pg (ref 26.0–34.0)
MCHC: 34.7 g/dL (ref 30.0–36.0)
MCV: 96.1 fL (ref 80.0–100.0)
Platelets: 137 10*3/uL — ABNORMAL LOW (ref 150–400)
RBC: 2.79 MIL/uL — ABNORMAL LOW (ref 3.87–5.11)
RDW: 12.1 % (ref 11.5–15.5)
WBC: 10.5 10*3/uL (ref 4.0–10.5)
nRBC: 0 % (ref 0.0–0.2)

## 2022-09-30 LAB — BASIC METABOLIC PANEL
Anion gap: 8 (ref 5–15)
BUN: 9 mg/dL (ref 8–23)
CO2: 23 mmol/L (ref 22–32)
Calcium: 8.1 mg/dL — ABNORMAL LOW (ref 8.9–10.3)
Chloride: 103 mmol/L (ref 98–111)
Creatinine, Ser: 0.96 mg/dL (ref 0.44–1.00)
GFR, Estimated: 57 mL/min — ABNORMAL LOW (ref >60)
Glucose, Bld: 94 mg/dL (ref 70–99)
Potassium: 3.3 mmol/L — ABNORMAL LOW (ref 3.5–5.1)
Sodium: 134 mmol/L — ABNORMAL LOW (ref 135–145)

## 2022-09-30 LAB — GLUCOSE, CAPILLARY: Glucose-Capillary: 118 mg/dL — ABNORMAL HIGH (ref 70–99)

## 2022-09-30 MED ORDER — CARVEDILOL 6.25 MG PO TABS: 6.2500 mg | ORAL_TABLET | Freq: Two times a day (BID) | ORAL | 0 refills | Status: DC

## 2022-09-30 MED ORDER — DONEPEZIL HCL 5 MG PO TABS: 5.0000 mg | ORAL_TABLET | Freq: Every day | ORAL | 0 refills | Status: DC

## 2022-09-30 MED ORDER — AZITHROMYCIN 500 MG PO TABS: 500.0000 mg | ORAL_TABLET | Freq: Every day | ORAL | 0 refills | Status: AC

## 2022-09-30 MED ORDER — POTASSIUM CHLORIDE 20 MEQ PO PACK
40.0000 meq | PACK | Freq: Two times a day (BID) | ORAL | Status: DC
  Administered 2022-09-30: 40 meq via ORAL
  Filled 2022-09-30: qty 2

## 2022-09-30 MED ORDER — AZITHROMYCIN 500 MG PO TABS: 500.0000 mg | ORAL_TABLET | Freq: Every day | ORAL | 0 refills | Status: DC

## 2022-09-30 MED ORDER — METRONIDAZOLE 500 MG PO TABS: 500.0000 mg | ORAL_TABLET | Freq: Three times a day (TID) | ORAL | 0 refills | Status: AC

## 2022-09-30 MED ORDER — METRONIDAZOLE 500 MG PO TABS: 500.0000 mg | ORAL_TABLET | Freq: Three times a day (TID) | ORAL | 0 refills | Status: DC

## 2022-09-30 NOTE — TOC Initial Note (Signed)
Transition of Care (TOC) - Initial/Assessment Note   Spoke to patient at bedside. Patient from home with husband.   Discussed home health PT, provided medicare.gov list of home health agencies.   Patient would like [facility]. Tommi Rumps with [facility] accepted referral. Asked MD for order and face to face   Patient states she already has walker at home Patient Details  Name: Regina [name] MRN: [id number] Date of Birth: [birth year]  Transition of Care [facility]) CM/SW Contact:    Marilu Favre, RN Phone Number: 09/30/2022, 12:17 PM  Clinical Narrative:                   Expected Discharge Plan: [place]     Patient Goals and CMS Choice Patient states their goals for this hospitalization and ongoing recovery are:: to return to home          Expected Discharge Plan and Services   Discharge Planning Services: CM Consult Post Acute Care Choice: [place] arrangements for the past 2 months: Single Family Home                 DME Arranged: N/A           HH Agency: [place] Date [facility] Agency Contacted: 09/30/22 Time HH Agency Contacted: [id number] Representative spoke with at [facility]: cory  Prior Living Arrangements/Services Living arrangements for the past 2 months: [place] Lives with:: Spouse Patient language and need for interpreter reviewed:: Yes Do you feel safe going back to the place where you live?: Yes      Need for Family Participation in Patient Care: Yes (Comment) Care giver support system in place?: Yes (comment) Current home services: DME Criminal Activity/Legal Involvement Pertinent to Current Situation/Hospitalization: No - Comment as needed  Activities of Daily Living Home Assistive Devices/Equipment: Walker (specify type) ADL Screening (condition at time of admission) Patient's cognitive ability adequate to safely complete daily activities?: Yes Is the patient deaf or have difficulty hearing?: Yes Does the  patient have difficulty seeing, even when wearing glasses/contacts?: Yes Does the patient have difficulty concentrating, remembering, or making decisions?: Yes Patient able to express need for assistance with ADLs?: Yes Does the patient have difficulty dressing or bathing?: Yes Independently performs ADLs?: No Communication: Independent Dressing (OT): Needs assistance Feeding: Independent Bathing: Needs assistance Toileting: Needs assistance Walks in Home: Needs assistance Does the patient have difficulty walking or climbing stairs?: Yes Weakness of Legs: Both Weakness of Arms/Hands: Both  Permission Sought/Granted   Permission granted to share information with : No              Emotional Assessment Appearance:: Appears stated age Attitude/Demeanor/Rapport: Engaged Affect (typically observed): Accepting Orientation: : Oriented to Self, Oriented to Place, Oriented to  Time, Oriented to Situation Alcohol / Substance Use: Not Applicable Psych Involvement: No (comment)  Admission diagnosis:  Colitis [K52.9] Acute colitis [K52.9] Patient Active Problem List   Diagnosis Date Noted   Acute colitis 09/27/2022   Parkinsonism 02/14/2022   UTI (urinary tract infection) 04/09/2021   Gait disorder 12/28/2020   Malnutrition of moderate degree 03/10/2018   Acute on chronic diastolic CHF (congestive heart failure) (HCC)    Hip pain    Acute cystitis without hematuria    Delirium    Memory loss    Ataxia 03/09/2018   Weakness generalized 03/09/2018   Acute renal failure superimposed on chronic kidney disease ([place]) 03/09/2018   Generalized weakness 03/09/2018  Internal hemorrhoids with complication [id number]   Ulcer of lower limb ([place]) 08/02/2014   Chronic diastolic heart failure ([place]) 08/01/2014   Right bundle branch block 08/01/2014   Essential hypertension 08/01/2014   Hypothyroidism 08/01/2014   Rectal bleeding 07/31/2014   Hypertension 07/31/2014   GERD (gastroesophageal  reflux disease) 07/31/2014   Anemia due to other cause 07/31/2014   Lower GI bleeding    Faintness    Syncope 07/30/2014   Varicose veins of lower extremities with other complications [id number]   PCP:  [name], MD Pharmacy:   [facility] ([place] [place] [id number] [place] ([place]) [place] [address] Phone: [phone] Fax: [phone]     Social Determinants of Health (SDOH) Social History: SDOH Screenings   Tobacco Use: Low Risk  (09/27/2022)   SDOH Interventions:     Readmission Risk Interventions     No data to display

## 2022-09-30 NOTE — Discharge Summary (Addendum)
Name: ANNAZETTE GAMBINO MRN: [id number] DOB: [birth year] 89 y.o. PCP: [name], MD  Date of Admission: 09/27/2022 11:23 AM Date of Discharge: 09/30/2022 Attending Physician: Dr. Dareen Piano  Discharge Diagnosis: Principal Problem:   Acute colitis     Discharge Medications: Allergies as of 09/30/2022       Reactions   Tape Other (See Comments)   TAPE PULLS OFF THE SKIN, SO PLEASE USE AN ALTERNATIVE   Penicillins Hives, Itching   Latex Rash, Other (See Comments)   NO Band-Aids!! Pulls off skin         Medication List     TAKE these medications    azithromycin 500 MG tablet Commonly known as: Zithromax Take 1 tablet (500 mg total) by mouth daily for 3 days. Take 1 tablet daily for 3 days.   calcitRIOL 0.25 MCG capsule Commonly known as: ROCALTROL Take 0.25 mcg by mouth daily.   carbidopa-levodopa 25-100 MG tablet Commonly known as: Sinemet Take 1 tablet by mouth 3 (three) times daily.   carvedilol 6.25 MG tablet Commonly known as: COREG Take 1 tablet (6.25 mg total) by mouth 2 (two) times daily with a meal. What changed:  how much to take Another medication with the same name was removed. Continue taking this medication, and follow the directions you see here.   donepezil 5 MG tablet Commonly known as: ARICEPT Take 1 tablet (5 mg total) by mouth at bedtime.   metroNIDAZOLE 500 MG tablet Commonly known as: Flagyl Take 1 tablet (500 mg total) by mouth 3 (three) times daily for 14 days.   senna-docusate 8.6-50 MG tablet Commonly known as: Senokot-S Take 1 tablet by mouth daily as needed for mild constipation.   Synthroid 75 MCG tablet Generic drug: levothyroxine Take 75 mcg by mouth daily before breakfast.   Systane Ultra PF 0.4-0.3 % Soln Generic drug: Polyethyl Glyc-Propyl Glyc PF Place 1 drop into both eyes in the morning, at noon, and at bedtime.         Disposition and follow-up:    Ms.Suman C Schiefelbein was discharged from [facility]  in Stable condition.  At the hospital follow up visit please address:   1.  Colitis, likely infectious: Ensure that symptoms including abdominal pain and bloody stools have resolved without recurrence. Ensure she has finished last 2 days of antibiotics (azithromycin and flagyl).   2. Parkinsonism: Interval MoCA assessments per PCP to evaluate for development of dementia. Please also look into additional home support as needed given husband has several comorbid conditions and adopted son with cerebral palsy. Need to ensure safe home environment.  3. Labs / imaging needed at time of follow-up: none  4.  Pending labs / tests needing follow-up: consider CBC to check Hgb    Follow-up Appointments:    Hospital Course by problem list:  Ms Boutin is an 89 year old female with hypertension, hypothyroidism, chronic diastolic heart failure, chronic kidney disease, and Parkinsonism who presents with bright-red-colored stools, now admitted for management of colitis and further diagnostic workup.     ---Acute long-segment colitis ---History of hemorrhoids Patient presented with five episodes of loose bright-red-colored stools and abdominal pain that started on 2-15. Upon arrival, abdominal-pelvic CT revealed long segment colitis involving the ascending through proximal descending colon. Exam notable for diffuse abdominal tenderness, palpable intrarectal masses, and red blood per rectum. Antibiotics started for suspected infectious etiology, ciprofloxacin switched to azithromycin given development of pruritic rash. Gastrointestinal panel and C.difficile test negative. Hemoglobin fell from 12.1  to 9.3 during hospitalization. Bleeding and leukocytosis resolved with symptoms improved after antibiotic administration, which were continued for two days post discharge.      ---Acute on chronic kidney disease III Patient has history of chronic kidney disease with GFR values fluctuating between 28 and 59 per  electronic medical record. Upon arrival, her creatinine was elevated to 1.72 above approximate baseline of 1.0-1.3. Creatinine returned to baseline after intravenous fluids and improved oral intake.     ---Parkinsonism Patient has history of parkinsonism first diagnosed in 07-2020 per electronic medical record. She has memory and gait disturbances at baseline, requires assistance with most daily activities. Her home carbidopa-levodopa and donepezil were continued throughout hospitalization. Home health physical therapy ordered upon discharge.     ---Chronic diastolic heart failure Patient has history of chronic diastolic heart failure, most recent echocardiogram performed 02-2018 demonstrated EF 60-65 and grade I diastolic dysfunction. She takes carvedilol at home. Denies breath shortness, orthopnea, and chest pain. Exam negative for signs of volume overload.     ---Hypertension Patient has history of hypertension managed at home with carvedilol daily. Upon arrival, blood pressure 129/53 and has remained elevated since admission. Primary team held carvedilol to preserve blood pressure in setting of acute bleed. Discharged with carvedilol 6.79m q12 given high blood pressure readings.     ---Hypothyroidism Patient has history of hypothyroidism and reports daily adherence to levothyroxine. Most recent TSH collected 03-2021 was within normal limits. Home levothyroxine 75ug q24 continued throughout hospitalization.        Discharge Exam:    BP (!) 151/56 (BP Location: Right Arm)   Pulse 61   Temp 98 F (36.7 C) (Oral)   Resp 16   Wt 59.1 kg   SpO2 99%   BMI 21.03 kg/m   Subjective: Ms RGabriellereports feeling good this morning, believes that the infection is better. Last bowel movement was yesterday and she thinks that it appeared normal in color. Discussed plan to advance diet to regular and discharge later today. Patient expressed understanding of and agreement with this plan.   General:                        awake and alert, sitting comfortably in chair, cooperative, not in acute distress Skin:                             warm and dry Lungs:                          normal respiratory effort, breathing unlabored, symmetrical chest rise, no crackles or wheezing Cardiac:                        regular rate and rhythm, normal S1 and S2, no pitting edema Abdomen:                     soft and non-distended, no tenderness to palpation or rigidity Neurologic:                   oriented to person-place-time, moving all extremities, no gross focal deficits Psychiatric:                   euthymic mood with congruent affect, intelligible speech   Pertinent Labs, Studies, and Procedures:   Labs:    Latest Ref Rng & Units  09/30/2022    5:03 AM 09/29/2022    3:15 AM 09/28/2022    3:08 AM  CBC  WBC 4.0 - 10.5 K/uL 10.5  14.0  13.7   Hemoglobin 12.0 - 15.0 g/dL 9.3  9.6  10.3   Hematocrit 36.0 - 46.0 % 26.8  28.1  31.3   Platelets 150 - 400 K/uL 137  128  130       Latest Ref Rng & Units 09/30/2022    5:03 AM 09/29/2022    3:15 AM 09/28/2022    3:08 AM  CMP  Glucose 70 - 99 mg/dL 94  91  90   BUN 8 - 23 mg/dL 9  13  21   $ Creatinine 0.44 - 1.00 mg/dL 0.96  1.10  1.22   Sodium 135 - 145 mmol/L 134  134  135   Potassium 3.5 - 5.1 mmol/L 3.3  3.6  3.6   Chloride 98 - 111 mmol/L 103  103  105   CO2 22 - 32 mmol/L 23  23  21   $ Calcium 8.9 - 10.3 mg/dL 8.1  8.1  8.1     ______________________  Imaging:  CT Abdomen Pelvis W Contrast Result Date: 09/27/2022 IMPRESSION: Long segment colitis involving ascending, transverse and proximal descending colon. Favor infectious or inflammatory process. Mesenteric stranding. No obstruction or free air. Nonspecific right hepatic lobe portal to hepatic vein shunt. Scattered vascular calcifications particularly involving the renal arteries and aorta. Please correlate for level hypertension. Slight wall thickening of the underdistended urinary  bladder.    ______________________  Procedures:  none  ______________________   Discharge Instructions:  Discharge Instructions     Diet - low sodium heart healthy   Complete by: As directed    Discharge instructions   Complete by: As directed    Ms Grandpre,  It was a pleasure taking care of you while you were in the hospital. Your bloody stools were caused by an infection, which we treated with antibiotics. After receiving these medications, your symptoms improved and bleeding stopped.   We were also tracking your laboratory results, which showed improvement in your symptoms. Your diet was slowly advanced as your symptoms improved.  If your bloody stools return, then please visit the emergency department.   Increase activity slowly   Complete by: As directed       Ms [place],  It was a pleasure taking care of you while you were in the hospital. Your bloody stools were caused by an infection, which we treated with antibiotics. After receiving these medications, your symptoms improved and bleeding stopped.   We were also tracking your laboratory results, which showed improvement in your symptoms. Your diet was slowly advanced as your symptoms improved.  If your bloody stools return, then please visit the emergency department.     Signed: Roswell Nickel, MD Internal Medicine PGY-1 Pager [phone]

## 2022-09-30 NOTE — Progress Notes (Signed)
Regina [name] to be D/C'd per MD order. Discussed with the patient and all questions fully answered. ? VSS, Skin clean, dry and intact without evidence of skin break down, no evidence of skin tears noted. ? IV catheter discontinued intact. Site without signs and symptoms of complications. Dressing and pressure applied. ? An After Visit Summary was printed and given to the patient. Patient informed where to pickup prescriptions. ? D/c education completed with patient/family including follow up instructions, medication list, d/c activities limitations if indicated, with other d/c instructions as indicated by MD - patient able to verbalize understanding, all questions fully answered.  ? Patient instructed to return to ED, call 911, or call MD for any changes in condition.  ? Patient to be escorted via [place], and D/C home via private auto.

## 2022-11-06 ENCOUNTER — Other Ambulatory Visit: Payer: Self-pay | Admitting: Student

## 2022-11-08 ENCOUNTER — Other Ambulatory Visit: Payer: Self-pay | Admitting: Student

## 2022-12-18 ENCOUNTER — Ambulatory Visit (INDEPENDENT_AMBULATORY_CARE_PROVIDER_SITE_OTHER): Payer: Medicare Other | Admitting: Neurology

## 2022-12-18 ENCOUNTER — Encounter: Payer: Self-pay | Admitting: Neurology

## 2022-12-18 VITALS — BP 130/41 | HR 58 | Ht 67.0 in | Wt 126.0 lb

## 2022-12-18 DIAGNOSIS — Z9289 Personal history of other medical treatment: Secondary | ICD-10-CM | POA: Diagnosis not present

## 2022-12-18 DIAGNOSIS — G20C Parkinsonism, unspecified: Secondary | ICD-10-CM

## 2022-12-18 DIAGNOSIS — R413 Other amnesia: Secondary | ICD-10-CM | POA: Diagnosis not present

## 2022-12-18 NOTE — Progress Notes (Signed)
Subjective:    Patient ID: Regina [name] is a 89 y.o. female.  HPI    Interim history:   Ms. Dequisha Goldbaum is an 89 year old right-handed woman with an underlying medical history of allergies, reflux disease, hypertension, thyroid disease, gait disorder and parkinsonism, who presents for follow-up consultation of her parkinsonism.  She is accompanied by her husband  today. I first met her on 10/16/2021, at which time her husband provided most of her history.  She was on Sinemet 1 pill 3 times daily.  She had a fall in December 2022.  She was using a cane to ambulate.  She was advised to use a walker at all times.  She was advised to continue with her Sinemet 1 pill 3 times daily.    She saw Margie Ege, NP on 02/14/2022, at which time her memory loss was discussed, her MMSE was 24 out of 30 at the time.  Due to low heart rate in the 50s she was not considered a good candidate for donepezil and was started on low-dose Namenda with gradual increase.  She saw Margie Ege, NP on 07/17/2022, at which time her MMSE was 22 out of 30.  Referral to PT and OT was discussed but she had just finished a course of therapy.  She had sustained a heat related syncope in August 2023.  She was advised to continue with Namenda 10 mg twice daily.  Her husband called in the interim on 08/26/2022 reporting that patient had passed out while sitting in the chair.  They were advised to follow-up with primary care ASAP.   Today, 12/18/2022: She reports very little, her husband is not sure when and why she stopped the memantine.  As needed recalls, she may have had stomach problems with it or could not tolerate it or could not sleep well, she stopped it but unclear when exactly.  As he recalls, she was started on donepezil in the hospital.  He does report that she has lower heart rate at home, but she has had high blood pressure values at times first thing in the morning.  He is not sure if the primary care may have started the donepezil  and may have recommended stopping the memantine.  She uses a walker outside the home but typically not inside the home.  Of note, she was hospitalized in February from 09/27/2022 through 09/30/2022 with colitis, deemed infectious.  She was treated with azithromycin and Flagyl.  For some reason she was started on donepezil at the time, 5 mg strength.   The patient's allergies, current medications, family history, past medical history, past social history, past surgical history and problem list were reviewed and updated as appropriate.    Previously:  (She) presents for a sooner than scheduled appointment after a recent DaT scan.  She has previously followed with Dr. Anne Hahn and has also seen Shawnie Dapper, NP as well as Margie Ege, NP.  She was most recently seen by Maralyn Sago on 09/25/2021.  I reviewed the note and also copy prior notes below for reference. She had a DaTscan on 10/10/2021 and I reviewed the results: IMPRESSION: Marked decreased activity within the LEFT and RIGHT striatum is of pattern suggestive of Parkinson's syndrome pathology. Of note, DaTSCAN is not diagnostic of Parkinsonian syndromes, which remains a clinical diagnosis. DaTscan is an adjuvant test to aid in the clinical diagnosis of Parkinsonian syndromes.     <<09/25/21 SS: Lakhia is here today with her husband for follow-up. Memory issues  since suspected viral meningitis in summer 2019. MMSE 17/30. Memory has declined, isn't able to do much for herself. Needs help with ADLs. Needs helping using the bathroom. 1 fall after tripping over cane, skinned her arm. Using walker. Takes 1/2 tablet twice daily of Sinemet, hasn't noted much change with use. Had bad hips that make it hard for her to move around. She is in PT/OT now, not any change. Is hard of hearing. Her mood can be poor at times. Sleeps well. Can be obsessed with trash on the floor. In PT/OT not seeing any benefit. >>    <<09/25/21 ALL: CARAL JOHNSON is a 89 y.o. female here today  for follow up for memory disturbance and gait disorder concerning for parkinsonism. She presents with her husband who aids in history. Maralyn Sago started her on Sinemet 25-100 1/2 tablet twice daily at last follow up 12/2020. Mr Pancoast does not feel that it has made any significant difference in her movements. He does feel that hand tremor has improved. She seems to be tolerating medication well.    She was hospitalized for a UTI on 8/29. She was discharged to [facility] 9/1 and was discharged on 9/15. She started PT/OT last week. She has a Charity fundraiser coming to check on her weekly. She has not had any falls since last being seen. She reports significant hip pain, bilaterally. Her husband feels this is a significant contributor to her not being active. She refuses to take pain medication for pain. Mr Snipes reports that she is sleeping more than normal. She has a good appetite. She denies depression. >>   <<12/28/20 SS: Ms. Magnant is an 89 year old female with history of memory issues.  MRI of the brain in December 2021 showed atrophy affecting temporal lobes could be consistent with memory disorder.  Mild small vessel disease, no change from 2019.  She has some Parkinson features, tried 1/2 of Sinemet resulted in dizziness, she stopped it. Memory isn't all that bad, gets confused about things. Is scared to take a shower by herself, has no confidence. No fall since last seen. They adopted their 24 year grandson, has handicap issues. She worries about everything. Is not active, mostly sit around. She does her own laundry. Sleeps well, good appetite. Moves slow, has muscle soreness all over.  Here today for evaluation accompanied by her husband.>>   07/20/2020( Dr. Anne Hahn): <<Ms. Graybeal is an 89 year old right-handed white female with a history of some memory issues that occurred since she had a presumed viral meningitis in the summer 2019.  The patient returns the office today.  She is sleeping fairly well, there is some report from her  husband that her confusion seems to be getting slowly worse over time.  The patient is no longer cooking, she will do housework, she does not operate a motor vehicle.  She needs assistance keeping up with medications and appointments.  She does report some cramping of her legs at nighttime.  She is hard of hearing and she reports that she is cold all the time.  She has leg swelling as well.  She returns to the office today for an evaluation.>>  Her Past Medical History Is Significant For: Past Medical History:  Diagnosis Date   Allergy    Barrett esophagus 08/12/2008   Cancer (HCC)    GERD (gastroesophageal reflux disease)    Hypertension    Thyroid disease    UTI (urinary tract infection)     Her Past Surgical History Is Significant  For: Past Surgical History:  Procedure Laterality Date   APPENDECTOMY     COLON SURGERY     COLONOSCOPY     COLONOSCOPY N/A 08/03/2014   Procedure: COLONOSCOPY;  Surgeon: Louis Meckel, MD;  Location: [facility] ENDOSCOPY;  Service: Endoscopy;  Laterality: N/A;   POLYPECTOMY     TONSILLECTOMY     TOTAL THYROIDECTOMY     TUBAL LIGATION      Her Family History Is Significant For: Family History  Problem Relation Age of Onset   Heart disease Mother    Heart disease Father    Diabetes Sister    COPD Brother    Diabetes Brother    Parkinson's disease Neg Hx     Her Social History Is Significant For: Social History   Socioeconomic History   Marital status: Married    Spouse name: Fayrene Fearing   Number of children: Not on file   Years of education: Not on file   Highest education level: Not on file  Occupational History   Not on file  Tobacco Use   Smoking status: Never   Smokeless tobacco: Never  Substance and Sexual Activity   Alcohol use: No   Drug use: No   Sexual activity: Not Currently  Other Topics Concern   Not on file  Social History Narrative   Lives with husband and son   Right Handed   Drinks 1-2 cups caffeine daily   Social  Determinants of Health   Financial Resource Strain: Not on file  Food Insecurity: Not on file  Transportation Needs: Not on file  Physical Activity: Not on file  Stress: Not on file  Social Connections: Not on file    Her Allergies Are:  Allergies  Allergen Reactions   Tape Other (See Comments)    TAPE PULLS OFF THE SKIN, SO PLEASE USE AN ALTERNATIVE   Penicillins Hives and Itching   Latex Rash and Other (See Comments)    NO Band-Aids!! Pulls off skin   :   Her Current Medications Are:  Outpatient Encounter Medications as of 12/18/2022  Medication Sig   calcitRIOL (ROCALTROL) 0.25 MCG capsule Take 0.25 mcg by mouth daily.   carbidopa-levodopa (SINEMET) 25-100 MG tablet Take 1 tablet by mouth 3 (three) times daily.   carvedilol (COREG) 6.25 MG tablet Take 1 tablet (6.25 mg total) by mouth 2 (two) times daily with a meal.   donepezil (ARICEPT) 5 MG tablet Take 1 tablet (5 mg total) by mouth at bedtime.   senna-docusate (SENOKOT-S) 8.6-50 MG tablet Take 1 tablet by mouth daily as needed for mild constipation. (Patient taking differently: Take 1 tablet by mouth as needed for mild constipation.)   SYNTHROID 75 MCG tablet Take 75 mcg by mouth daily before breakfast.   SYSTANE ULTRA PF 0.4-0.3 % SOLN Place 1 drop into both eyes in the morning, at noon, and at bedtime.   No facility-administered encounter medications on file as of 12/18/2022.  :  Review of Systems:  Out of a complete 14 point review of systems, all are reviewed and negative with the exception of these symptoms as listed below:  Review of Systems  Neurological:        Pt here for parkinson f/u Pt states increased tremors in both hands Husband states some memory loss Husband states Pt BP elevated  Pt states a lot of anxiety. Pt states cold all the time Pt has a delayed response when asking her questions     Objective:  Neurological  Exam  Physical Exam Physical Examination:   Vitals:   12/18/22 1052  BP: (!) 130/41   Pulse: (!) 58    General Examination: The patient is a very pleasant 89 y.o. female in no acute distress.  She appears frail.  She is hard of hearing.  She does not provide her history.    HEENT: Normocephalic, atraumatic, pupils are equal, round and reactive to light, tracking is mild to moderately impaired, corrective eyeglasses in place, hearing is impaired, no hearing aids.  Face is symmetric with mild facial masking, mild nuchal rigidity, no lip, neck or jaw tremor.  Airway examination reveals moderate mouth dryness, no sialorrhea, speech with mild hypophonia, no significant dysarthria.     Chest: Clear to auscultation without wheezing, rhonchi or crackles noted.   Heart: S1+S2+0, regular and normal without murmurs, rubs or gallops noted.    Abdomen: Soft, non-tender and non-distended.   Extremities: There is 1+ pitting edema in the distal lower extremities bilaterally.    Skin: Warm and dry with significant bruising and discoloration which appears chronic and quite severe in her upper extremities, also redness and patchy discoloration with hypo and hyperpigmentation in her distal lower extremities with significant swelling noted.  She had an injury or skin tear at the left elbow area but it looks healed or healing, no open wounds.   Musculoskeletal: exam reveals no obvious joint deformities, arthritic changes in both hands.     Neurologically:  Mental status: The patient is awake, alert and pays attention but is unable to provide details of her history.  Her memory is impaired.   Cranial nerves II - XII are as described above under HEENT exam.  Motor exam: Thin bulk, global strength of about 4 out of 5, no obvious resting tremor.  Fine motor skills with finger taps and foot taps are moderately impaired in the upper and lower extremities without obvious lateralization noted.    Cerebellar testing: No dysmetria or intention tremor. There is no truncal or gait ataxia.  Sensory exam:  intact to light touch in the upper and lower extremities.  Gait, station and balance: She stands with difficulty and pushes herself up. She requires some assistance.  Posture is moderately stooped and she has a slight lean to the right with her upper body.  She uses a 2 wheeled walker and has mild shuffling, no freezing, no obvious festination, mild difficulty with turns.  Balance is impaired.   Assessment and Plan:  In summary, LAKIETHA WIGFIELD is an 89 year old right-handed woman with an underlying medical history of allergies, reflux disease, hypertension, thyroid disease, gait disorder and parkinsonism, who presents for follow-up consultation of her parkinsonism.  She had a hospitalization in February 2024 for colitis.  She has had syncopal spells, unclear etiology but does have a history of bradycardia.  Her Namenda was recently stopped, unclear if the patient stopped it on her own or if primary care stopped it or if it was stopped in the hospital.  She has been on generic Aricept 5 mg strength daily which we had previously avoided because of her bradycardia.  Her heart rate is in the 50s.  Her husband is advised to let us know if she has bradycardia below 50 consistently, we will have to stop the donepezil.  She is advised to follow-up with her current providers, she is advised to use her walker inside and outside the home consistently.  We talked about the importance of fall prevention.  She is advised  to stay better hydrated with water.  Her husband states that she used to hydrate well with water and then stopped drinking bottled water, he estimates that she drinks about 4 cups/day.  She can maintain on the current low-dose donepezil and generic Sinemet at the current dose.  She is advised to follow-up to see Margie Ege, NP in 4 to 6 months in this clinic routinely, sooner if needed.  I answered all the questions today and the patient and her husband were in agreement.  I spent 30 minutes in total  face-to-face time and in reviewing records during pre-charting, more than 50% of which was spent in counseling and coordination of care, reviewing test results, reviewing medications and treatment regimen and/or in discussing or reviewing the diagnosis of PD, history of recent hospitalization, the prognosis and treatment options. Pertinent laboratory and imaging test results that were available during this visit with the patient were reviewed by me and considered in my medical decision making (see chart for details).

## 2022-12-18 NOTE — Patient Instructions (Addendum)
We will continue with your current memory medication. You stopped or were taken off the memantine.   The donepezil may cause low heart rate, please continue to monitor, if it is consistently below 50, let us know, we may have to stop the donepezil.

## 2022-12-24 ENCOUNTER — Ambulatory Visit (INDEPENDENT_AMBULATORY_CARE_PROVIDER_SITE_OTHER): Payer: Medicare Other | Admitting: Podiatry

## 2022-12-24 DIAGNOSIS — I739 Peripheral vascular disease, unspecified: Secondary | ICD-10-CM | POA: Diagnosis not present

## 2022-12-24 DIAGNOSIS — M79609 Pain in unspecified limb: Secondary | ICD-10-CM

## 2022-12-24 DIAGNOSIS — B351 Tinea unguium: Secondary | ICD-10-CM | POA: Diagnosis not present

## 2022-12-27 ENCOUNTER — Encounter: Payer: Self-pay | Admitting: Podiatry

## 2022-12-27 NOTE — Progress Notes (Signed)
  Subjective:  Patient ID: Regina [name], female    DOB: Apr 15, 1933,  MRN: [id number]  89 y.o. female presents for  Chief Complaint  Patient presents with   Nail Problem    [facility] BS-do not check A1C-"Normal" PCP-[facility] PCP [facility] 4 weeks ago   New problem(s): None   PCP is Ralene Ok, MD.  Allergies  Allergen Reactions   Tape Other (See Comments)    TAPE PULLS OFF THE SKIN, SO PLEASE USE AN ALTERNATIVE   Penicillins Hives and Itching   Latex Rash and Other (See Comments)    NO Band-Aids!! Pulls off skin     Review of Systems: Negative except as noted in the HPI.   Objective:  Regina [name] is a pleasant 89 y.o. female WD, WN in NAD.Marland Kitchen AAO x 3.  Vascular Examination: CFT <3 seconds b/l. DP pulses faintly palpable b/l. PT pulses nonpalpable b/l. Digital hair absent. Skin temperature gradient warm to warm b/l. No pain with calf compression. No ischemia or gangrene. No cyanosis or clubbing noted b/l.    Neurological Examination: Sensation grossly intact b/l with 10 gram monofilament. Vibratory sensation intact b/l.   Dermatological Examination: Pedal skin warm and supple b/l. No open wounds b/l. No interdigital macerations. Toenails 1-5 b/l thick, discolored, elongated with subungual debris and pain on dorsal palpation.    Musculoskeletal Examination: Muscle strength 5/5 to b/l LE. No pain, crepitus or joint limitation noted with ROM bilateral LE. No gross bony deformities bilaterally. Utilizes walker for ambulation assistance.  Radiographs: None   Assessment:   1. Pain due to onychomycosis of nail   2. PVD (peripheral vascular disease) (HCC)    Plan:  -Consent given for treatment as described below: -Examined patient. -Continue supportive shoe gear daily. -Toenails 1-5 b/l were debrided in length and girth with sterile nail nippers and dremel without iatrogenic bleeding.  -Patient/POA to call should there be question/concern in the interim.  Return in about 9 weeks  (around 02/25/2023).  Regina [name], DPM

## 2022-12-28 ENCOUNTER — Ambulatory Visit (INDEPENDENT_AMBULATORY_CARE_PROVIDER_SITE_OTHER): Payer: Medicare Other

## 2022-12-28 ENCOUNTER — Ambulatory Visit
Admission: EM | Admit: 2022-12-28 | Discharge: 2022-12-28 | Disposition: A | Discharge status: Home / Self Care | Payer: Medicare Other | Attending: Internal Medicine | Admitting: Internal Medicine

## 2022-12-28 DIAGNOSIS — S61412A Laceration without foreign body of left hand, initial encounter: Secondary | ICD-10-CM

## 2022-12-28 DIAGNOSIS — M79642 Pain in left hand: Secondary | ICD-10-CM

## 2022-12-28 NOTE — Discharge Instructions (Signed)
X-Stolar was normal.  Continue dressing changes as needed.  If symptoms of increased redness, swelling, pus occur please follow-up sooner.  Recommend that you follow-up with family medicine doctor and/or wound care for further evaluation.

## 2022-12-28 NOTE — ED Triage Notes (Signed)
Pt c/o worsening skin tear left hand for ~ 2 weeks that is causing edema, pain, discoloration

## 2022-12-28 NOTE — ED Provider Notes (Signed)
[facility]    CSN: [id number] Arrival date & time: 12/28/22  1124      History   Chief Complaint Chief Complaint  Patient presents with   Wound Check    HPI Regina [name] is a 89 y.o. female.   Patient presents with her husband who helps provide majority of history.  They report that she hit her hand on the door about 2 to 3 weeks ago causing a skin tear on the left hand.  Reports they have been changing the dressing but are concerned given that it is not healing properly.  She does not take any blood thinning medications.  She denies numbness or tingling but does report pain in the area of the skin tear.  She has not been seen by healthcare provider since injury occurred.  Tetanus vaccine was updated in 2023.   Wound Check    Past Medical History:  Diagnosis Date   Allergy    Barrett esophagus 08/12/2008   Cancer (HCC)    GERD (gastroesophageal reflux disease)    Hypertension    Thyroid disease    UTI (urinary tract infection)     Patient Active Problem List   Diagnosis Date Noted   Acute colitis 09/27/2022   Parkinsonism 02/14/2022   UTI (urinary tract infection) 04/09/2021   Gait disorder 12/28/2020   Malnutrition of moderate degree 03/10/2018   Acute on chronic diastolic CHF (congestive heart failure) (HCC)    Hip pain    Acute cystitis without hematuria    Delirium    Memory loss    Ataxia 03/09/2018   Weakness generalized 03/09/2018   Acute renal failure superimposed on chronic kidney disease (HCC) 03/09/2018   Generalized weakness 03/09/2018   Internal hemorrhoids with complication 08/03/2014   Ulcer of lower limb (HCC) 08/02/2014   Chronic diastolic heart failure (HCC) 08/01/2014   Right bundle branch block 08/01/2014   Essential hypertension 08/01/2014   Hypothyroidism 08/01/2014   Rectal bleeding 07/31/2014   Hypertension 07/31/2014   GERD (gastroesophageal reflux disease) 07/31/2014   Anemia due to other cause 07/31/2014   Lower  GI bleeding    Faintness    Syncope 07/30/2014   Varicose veins of lower extremities with other complications 07/05/2013    Past Surgical History:  Procedure Laterality Date   APPENDECTOMY     COLON SURGERY     COLONOSCOPY     COLONOSCOPY N/A 08/03/2014   Procedure: COLONOSCOPY;  Surgeon: Louis Meckel, MD;  Location: [facility] ENDOSCOPY;  Service: Endoscopy;  Laterality: N/A;   POLYPECTOMY     TONSILLECTOMY     TOTAL THYROIDECTOMY     TUBAL LIGATION      OB History   No obstetric history on file.      Home Medications    Prior to Admission medications   Medication Sig Start Date End Date Taking? Authorizing Provider  calcitRIOL (ROCALTROL) 0.25 MCG capsule Take 0.25 mcg by mouth daily.    [provider]  carbidopa-levodopa (SINEMET) 25-100 MG tablet Take 1 tablet by mouth 3 (three) times daily. 07/17/22   Glean Salvo, NP  carvedilol (COREG) 6.25 MG tablet Take 1 tablet (6.25 mg total) by mouth 2 (two) times daily with a meal. Patient not taking: Reported on 12/24/2022 09/30/22   Crissie Sickles, MD  donepezil (ARICEPT) 5 MG tablet Take 1 tablet (5 mg total) by mouth at bedtime. 09/30/22   Crissie Sickles, MD  senna-docusate (SENOKOT-S) 8.6-50 MG tablet Take 1 tablet  by mouth daily as needed for mild constipation. Patient taking differently: Take 1 tablet by mouth as needed for mild constipation. 04/12/21   Marolyn Haller, MD  SYNTHROID 75 MCG tablet Take 75 mcg by mouth daily before breakfast.    [provider]  SYSTANE ULTRA PF 0.4-0.3 % SOLN Place 1 drop into both eyes in the morning, at noon, and at bedtime.    [provider]  valsartan (DIOVAN) 40 MG tablet Take 40 mg by mouth daily. 10/15/22   [provider]    Family History Family History  Problem Relation Age of Onset   Heart disease Mother    Heart disease Father    Diabetes Sister    COPD Brother    Diabetes Brother    Parkinson's disease Neg Hx     Social History Social  History   Tobacco Use   Smoking status: Never   Smokeless tobacco: Never  Substance Use Topics   Alcohol use: No   Drug use: No     Allergies   Tape, Penicillins, and Latex   Review of Systems Review of Systems Per HPI  Physical Exam Triage Vital Signs ED Triage Vitals [12/28/22 1207]  Enc Vitals Group     BP 132/71     Pulse Rate 60     Resp 16     Temp (!) 97.4 F (36.3 C)     Temp Source Oral     SpO2 95 %     Weight      Height      Head Circumference      Peak Flow      Pain Score      Pain Loc      Pain Edu?      Excl. in GC?    No data found.  Updated Vital Signs BP 132/71 (BP Location: Right Arm)   Pulse 60   Temp (!) 97.4 F (36.3 C) (Oral)   Resp 16   SpO2 95%   Visual Acuity Right Eye Distance:   Left Eye Distance:   Bilateral Distance:    Right Eye Near:   Left Eye Near:    Bilateral Near:     Physical Exam Constitutional:      General: She is not in acute distress.    Appearance: Normal appearance. She is not toxic-appearing or diaphoretic.  HENT:     Head: Normocephalic and atraumatic.  Eyes:     Extraocular Movements: Extraocular movements intact.     Conjunctiva/sclera: Conjunctivae normal.  Pulmonary:     Effort: Pulmonary effort is normal.  Skin:    Comments: Has approximately 3/4 inch very superficial curvilinear skin tear present to the dorsal surface of the left hand at the proximal first digit.  No bleeding noted.  No obvious increased purulent drainage or erythema.  There is bruising discoloration surrounding this that extends to the dorsal surface of the hand.  Patient has full range of motion of fingers and thumb.  Capillary refill and pulses are intact. Neurovascularly intact.   Neurological:     General: No focal deficit present.     Mental Status: She is alert and oriented to person, place, and time. Mental status is at baseline.  Psychiatric:        Mood and Affect: Mood normal.        Behavior: Behavior normal.         Thought Content: Thought content normal.  Judgment: Judgment normal.      UC Treatments / Results  Labs (all labs ordered are listed, but only abnormal results are displayed) Labs Reviewed - No data to display  EKG   Radiology DG Hand Complete Left  Result Date: 12/28/2022 CLINICAL DATA:  Left hand pain EXAM: LEFT HAND - COMPLETE 3+ VIEW COMPARISON:  04/22/2020 FINDINGS: Bones are demineralized. No fracture identified. Relatively mild osteoarthritic changes of the hand and wrist are similar to prior. There is soft tissue swelling over the dorsum of the hand. IMPRESSION: Soft tissue swelling over the dorsum of the hand. No acute osseous abnormality. Electronically Signed   By: Duanne Guess D.O.   On: 12/28/2022 13:12    Procedures Procedures (including critical care time)  Medications Ordered in UC Medications - No data to display  Initial Impression / Assessment and Plan / UC Course  I have reviewed the triage vital signs and the nursing notes.  Pertinent labs & imaging results that were available during my care of the patient were reviewed by me and considered in my medical decision making (see chart for details).     Left hand x-Leske was negative for any acute bony abnormality.  Patient has a skin tear on the left hand that is having some delayed healing but no signs of infection or significant complication.  She is also neurovascularly intact which is reassuring.  Advised follow-up with family medicine and wound care at provided contact information.  Dressing applied by nursing staff and advised husband and patient on daily dressing changes.  Advised to monitor for signs of infection and follow-up sooner if this occurs.  Patient and husband verbalized understanding and were agreeable with plan. Final Clinical Impressions(s) / UC Diagnoses   Final diagnoses:  Skin tear of left hand without complication, initial encounter  Left hand pain     Discharge  Instructions      X-[place] was normal.  Continue dressing changes as needed.  If symptoms of increased redness, swelling, pus occur please follow-up sooner.  Recommend that you follow-up with family medicine doctor and/or wound care for further evaluation.     ED Prescriptions   None    PDMP not reviewed this encounter.   Gustavus Bryant, [place] 12/28/22 1327

## 2023-01-12 ENCOUNTER — Other Ambulatory Visit: Payer: Self-pay | Admitting: Student

## 2023-02-04 ENCOUNTER — Telehealth: Payer: Self-pay | Admitting: Neurology

## 2023-02-04 NOTE — Telephone Encounter (Signed)
Noted, thank you

## 2023-02-04 NOTE — Telephone Encounter (Signed)
I called the pt's husband Fayrene Fearing. He stated the first time he noted a HR <50 was on 6/20. I will include the BP log below that he provided. Today he is calling to report the consistent HR <50 as he states at previous visit Dr Frances Furbish wanted him to notify if this happened as we may stop a medication. He has not noticed any changes in her condition in conjunction with this timeline of the known HR <50. He states he is not aware of any new onset of symptoms including but not limited to chest pain, SOB, diaphoresis, lightheadedness, weakness, or change in alertness. He states overall she has had a decline in her memory. She does spend a large portion of the day in the recliner and dozes off but this has been ongoing for awhile. He says that when she bathes she will feel like she will pass out and this is usually when her BP is lower. He states this is also ongoing and he will have trouble getting her out of the water because it's warm and she stays cold. He states that turning the water temp down doesn't work. She occasionally will use a shower chair instead of bathing. He states that in regards to her Donepezil, this is given at night. The patient is on Valsartan and Coreg. He does not give Coreg typically if her BP is <150 in the AM but he will give Valsartan. Today her BP is 172/64 HR 48. He hasn't given her any meds yet until he receives instruction. I advised him not to give the Coreg at this time due to risk for further decreasing HR. Of note, the patient sleeps sometimes until 10 AM and he gives the Sinemet upon wakening and then gives another dose around 5 PM and then may not get in her 3rd dose that is prescribed. I advised I would send Dr Frances Furbish a message and we would call back with instructions. He was appreciative.   BP log: 01/30/23  AM: BP 137/56 HR 50 PM: BP 119/48 HR 48  02/01/23 AM: BP 186/59 HR 44 PM: BP 108/45 HR 43  02/02/23 AM: BP 164/56 HR 48  02/03/23 AM: BP 181/60 HR 44  02/04/23 AM: BP  172/64 HR 48

## 2023-02-04 NOTE — Telephone Encounter (Signed)
I spoke with the patient's husband and I discussed the recommendation from Dr. Frances Furbish to hold the donepezil until further notice and also get in touch with patient's cardiologist for further advice.  He stated the patient does not have a cardiologist but her primary care manages her blood pressure medication.  I advised him to call her primary care doctor as soon as possible to review her blood pressure and heart rate history and also current medications so he knows which blood pressure medications are okay to give.  I reminded him that Coreg can lower the heart rate so the provider may potentially make changes with that. He verbalized understanding. I told him I would also send our call over to the patient's primary care for their awareness.  He was appreciative.

## 2023-02-04 NOTE — Telephone Encounter (Signed)
Let's hold the donepezil until further notice and I would recommend he get in touch with cardiology too for further advice.

## 2023-02-04 NOTE — Telephone Encounter (Signed)
Pt spouse reports pt's pulse rate to be less than 50.  He said he was told to call and there would be a review of pt's carbidopa-levodopa (SINEMET) 25-100 MG tablet  if rate was ever under 50, please call.

## 2023-02-04 NOTE — Telephone Encounter (Signed)
Addendum: In my original call to patient's husband, I advised that if patient becomes symptomatic to call 911.

## 2023-02-04 NOTE — Telephone Encounter (Signed)
This entire phone note has been sent to the pt's PCP Dr Ludwig Clarks

## 2023-02-25 ENCOUNTER — Encounter: Payer: Self-pay | Admitting: Podiatry

## 2023-02-25 ENCOUNTER — Ambulatory Visit (INDEPENDENT_AMBULATORY_CARE_PROVIDER_SITE_OTHER): Payer: Medicare Other | Admitting: Podiatry

## 2023-02-25 DIAGNOSIS — B351 Tinea unguium: Secondary | ICD-10-CM | POA: Diagnosis not present

## 2023-02-25 DIAGNOSIS — M79609 Pain in unspecified limb: Secondary | ICD-10-CM

## 2023-02-25 NOTE — Progress Notes (Signed)
       Subjective:  Patient ID: Regina [name], female    DOB: 1932-12-02,  MRN: [id number]   Regina [name] presents to clinic today for:  Chief Complaint  Patient presents with   Nail Problem    Sore pt stated that feet gets sore when the weather is hot. Also wants her nails trimmed   . Patient notes nails are thick, discolored, elongated and painful in shoegear when trying to ambulate.  Patient does use a walker for assistance with ambulation.  Her husband is with her today.  PCP is Ralene Ok, MD.  Allergies  Allergen Reactions   Tape Other (See Comments)    TAPE PULLS OFF THE SKIN, SO PLEASE USE AN ALTERNATIVE   Penicillins Hives and Itching   Latex Rash and Other (See Comments)    NO Band-Aids!! Pulls off skin     Review of Systems: Negative except as noted in the HPI.  Objective:  There were no vitals filed for this visit.  Regina [name] is a pleasant 89 y.o. female in NAD. AAO x 3.  Vascular Examination: Capillary refill time is 3-5 seconds to toes bilateral. Palpable pedal pulses b/l LE. Digital hair present b/l. No pedal edema b/l. Skin temperature gradient WNL b/l. No varicosities b/l. No cyanosis or clubbing noted b/l.   Dermatological Examination: Pedal skin with normal turgor, texture and tone b/l. No open wounds. No interdigital macerations b/l. Toenails x10 are 3mm thick, discolored, dystrophic with subungual debris. There is pain with compression of the nail plates.  They are elongated x10  Assessment/Plan: 1. Pain due to onychomycosis of nail     The mycotic toenails were sharply debrided x10 with sterile nail nippers and a power debriding burr to decrease bulk/thickness and length.    Return in about 10 weeks (around 05/06/2023).   Clerance Lav, DPM, FACFAS [facility]     [address] [place], [place] [address]                Office [phone]  Fax [phone]

## 2023-05-05 ENCOUNTER — Ambulatory Visit (INDEPENDENT_AMBULATORY_CARE_PROVIDER_SITE_OTHER): Payer: Medicare Other | Admitting: Podiatry

## 2023-05-05 DIAGNOSIS — B351 Tinea unguium: Secondary | ICD-10-CM | POA: Diagnosis not present

## 2023-05-05 DIAGNOSIS — M79609 Pain in unspecified limb: Secondary | ICD-10-CM

## 2023-05-05 DIAGNOSIS — I739 Peripheral vascular disease, unspecified: Secondary | ICD-10-CM

## 2023-05-05 NOTE — Progress Notes (Signed)
Subjective:  Patient ID: Regina [name], female    DOB: 1933-06-02,  MRN: [id number]  Regina [name] presents to clinic today for:  Chief Complaint  Patient presents with   Nail Problem    Nail trim  . Patient notes nails are thick and elongated, causing pain in shoe gear when ambulating.  She uses a walker to assist with ambulation.    PCP is Ralene Ok, MD.  Past Medical History:  Diagnosis Date   Allergy    Barrett esophagus 08/12/2008   Cancer (HCC)    GERD (gastroesophageal reflux disease)    Hypertension    Thyroid disease    UTI (urinary tract infection)     Allergies  Allergen Reactions   Tape Other (See Comments)    TAPE PULLS OFF THE SKIN, SO PLEASE USE AN ALTERNATIVE   Penicillins Hives and Itching   Latex Rash and Other (See Comments)    NO Band-Aids!! Pulls off skin     Objective:  There were no vitals filed for this visit.  Regina [name] is a pleasant 88 y.o. female in NAD. AAO x 3.  Vascular Examination: Patient has palpable DP pulse, absent PT pulse bilateral.  Delayed capillary refill bilateral toes.  Sparse digital hair bilateral.  Proximal to distal cooling WNL bilateral.    Dermatological Examination: Interspaces are clear with no open lesions noted bilateral.  Skin is shiny and atrophic bilateral.  Nails are 3-100mm thick, with yellowish/brown discoloration, subungual debris and distal onycholysis x10.  There is pain with compression of nails x10.    Patient qualifies for at-risk foot care because of Diabetes with PVD .  Assessment/Plan: 1. Pain due to onychomycosis of nail   2. PVD (peripheral vascular disease) (HCC)    Mycotic nails x10 were sharply debrided with sterile nail nippers and power debriding burr to decrease bulk and length.  Return in about 10 weeks (around 07/14/2023) for [facility] (along with her husband for [facility]).   Clerance Lav, DPM, FACFAS [facility]     [address] [place], [place] [address]                Office [phone]  Fax [phone]

## 2023-05-07 ENCOUNTER — Ambulatory Visit: Payer: Medicare Other | Admitting: Podiatry

## 2023-07-01 NOTE — Progress Notes (Unsigned)
Patient: Regina [name] Date of Birth: 02-02-1933  Reason for Visit: Follow up  History from: Patient, husband Primary Neurologist: Dr. Frances [name]   ASSESSMENT AND PLAN 89 y.o. year old female   1.  Parkinsonism 2.  Memory loss, MMSE 24/30  -No major changes since last seen -We will continue Sinemet 25/100 mg 1 tablet 3 times daily -She will remain off Aricept and Namenda, husband has not noted any decline since being off memory medications.  Memory score was 24/30 today.  Aricept was stopped due to bradycardia, unclear why Namenda was stopped -Recommend ensuring drinking plenty of water, safe ambulation, recommend physical activity as tolerated.  I offered physical therapy, she refused at this time. -Also discussed mood management with PCP, determine if medication may be indicated to help with ups and down, some OCD tendencies  -DAT SCAN March 2023 showed marked decreased activity within the LEFT and RIGHT striatum is of pattern suggestive of Parkinson's syndrome pathology -Follow-up in 6 months or sooner if needed   HISTORY  10/16/21 Dr. Frances [name]: Ms. Regina [name] is an 89 year old right-handed woman with an underlying medical history of allergies, reflux disease, hypertension, thyroid disease, gait disorder and parkinsonism, who presents for follow-up consultation of her parkinsonism.  She is accompanied by her husband today and presents for a sooner than scheduled appointment after a recent DaT scan.  She has previously followed with Dr. Anne Hahn and has also seen Shawnie Dapper, NP as well as Margie Ege, NP.  She was most recently seen by Maralyn Sago on 09/25/2021.  I reviewed the note and also copy prior notes below for reference. She had a DaTscan on 10/10/2021 and I reviewed the results: IMPRESSION: Marked decreased activity within the LEFT and RIGHT striatum is of pattern suggestive of Parkinson's syndrome pathology. Of note, DaTSCAN is not diagnostic of Parkinsonian syndromes, which remains a clinical  diagnosis. DaTscan is an adjuvant test to aid in the clinical diagnosis of Parkinsonian syndromes.   Today, 10/16/2021: She reports feeling somewhat nervous.  She reports no pain.  Her husband provides most of her history.  She is also hard of hearing and does not have any hearing aids.  He reports that they have increased her Sinemet from half a pill twice daily to now 1 pill 3 times a day, she seems to tolerate it and seems to walk a little bit better and have more stamina.  She typically sits in her reclining chair most of the day but is able to be more upright lately.  She recently started seeing a dermatologist for brittle skin and swelling in her legs as well as redness in her legs.  She fell in December.  She was using a cane at the time.  She lives with her husband.  They have a 18 year old adoptive son and they also have 2 other grown children.  She takes Sinemet around 9 30-10 between 1 and 2 PM.  She eats at those times as well.  She hydrates fairly well, per husband, she drinks about 3 bottles of water per day on average.  She has had some home health therapy but this finished.  02/14/22 SS: Her with husband, taking Sinemet 1 tablet 3 times daily (10 AM, 3 PM, 10 PM), husband hasn't seen any change, has noticed she is slowing down. Short term memory isn't as good. Husband thought initially some benefit, now he thinks she has slowed down. She changes the subject, loses her train of thought. Using walker, 1 fall in  the yard, the rolling walker rolled away from her when she was standing. Sleeps well, eats good. Has low back pain from arthritis, won't take Tylenol, thinks it makes her legs swell.   Update July 17, 2022 SS: MMSE 22/30 today. Mentions few times when taking shower in the AM, while standing will feel faint. Would pass out, if her husband wasn't there to sit her on the shower chair. In AM BP sometimes high 200 systolic, pcp added Valsartan, shower spells happen when BP is low. Remains on  Sinemet 25/100 3 times daily 30 minutes before food, not sure has been any difference. Questions if this is causing erratic BP? Most morning doesn't get up until 11 AM. Memory is declining, needs help with all ADLs. Is hard to please her, grumpy. 1 fall in the summer after she passed out due to heat, had to go to ER. She sleeps well. Can focus on trash on the floor, carries multiple tissues around with her.  CT head August 2023 showed stable atrophy and white matter.  Update Dr. Frances [name] 12/18/2022: She reports very little, her husband is not sure when and why she stopped the memantine.  As needed recalls, she may have had stomach problems with it or could not tolerate it or could not sleep well, she stopped it but unclear when exactly.  As he recalls, she was started on donepezil in the hospital.  He does report that she has lower heart rate at home, but she has had high blood pressure values at times first thing in the morning.  He is not sure if the primary care may have started the donepezil and may have recommended stopping the memantine.  She uses a walker outside the home but typically not inside the home.   Of note, she was hospitalized in February from 09/27/2022 through 09/30/2022 with colitis, deemed infectious.  She was treated with azithromycin and Flagyl.  For some reason she was started on donepezil at the time, 5 mg strength.  Update July 02, 2023 SS: Called in June reporting heart rate less than 50, Aricept was held.  Doing about the same.  MMSE 24/30 today.  She continues to walk slow, uses walker.  No falls.  She is rather sedentary.  She complains a lot at home.  Likes her husband to be close, needs assistance with most ADLs.  Takes Sinemet 9, 4, 10.  She is up during the night with nocturia.  Has a good appetite, has to be careful with chewing.  Often sleeps during the day.  Tries to walk outside, but husband has a hard time getting her to leave the house.  Remains off Namenda.  REVIEW OF  SYSTEMS: Out of a complete 14 system review of symptoms, the patient complains only of the following symptoms, and all other reviewed systems are negative.  See HPI  ALLERGIES: Allergies  Allergen Reactions   Tape Other (See Comments)    TAPE PULLS OFF THE SKIN, SO PLEASE USE AN ALTERNATIVE   Penicillins Hives and Itching   Latex Rash and Other (See Comments)    NO Band-Aids!! Pulls off skin     HOME MEDICATIONS: Outpatient Medications Prior to Visit  Medication Sig Dispense Refill   calcitRIOL (ROCALTROL) 0.25 MCG capsule Take 0.25 mcg by mouth daily.     senna-docusate (SENOKOT-S) 8.6-50 MG tablet Take 1 tablet by mouth daily as needed for mild constipation. (Patient taking differently: Take 1 tablet by mouth as needed for mild constipation.) 30  tablet 1   SYNTHROID 75 MCG tablet Take 75 mcg by mouth daily before breakfast.     SYSTANE ULTRA PF 0.4-0.3 % SOLN Place 1 drop into both eyes in the morning, at noon, and at bedtime.     valsartan (DIOVAN) 40 MG tablet Take 40 mg by mouth daily.     carbidopa-levodopa (SINEMET) 25-100 MG tablet Take 1 tablet by mouth 3 (three) times daily. 270 tablet 3   carvedilol (COREG) 6.25 MG tablet Take 1 tablet (6.25 mg total) by mouth 2 (two) times daily with a meal. (Patient not taking: Reported on 12/24/2022) 60 tablet 0   donepezil (ARICEPT) 5 MG tablet Take 1 tablet (5 mg total) by mouth at bedtime. (Patient not taking: Reported on 02/04/2023) 30 tablet 0   No facility-administered medications prior to visit.    PAST MEDICAL HISTORY: Past Medical History:  Diagnosis Date   Allergy    Barrett esophagus 08/12/2008   Cancer (HCC)    GERD (gastroesophageal reflux disease)    Hypertension    Thyroid disease    UTI (urinary tract infection)     PAST SURGICAL HISTORY: Past Surgical History:  Procedure Laterality Date   APPENDECTOMY     COLON SURGERY     COLONOSCOPY     COLONOSCOPY N/A 08/03/2014   Procedure: COLONOSCOPY;  Surgeon: Louis Meckel, MD;  Location: [facility] ENDOSCOPY;  Service: Endoscopy;  Laterality: N/A;   POLYPECTOMY     TONSILLECTOMY     TOTAL THYROIDECTOMY     TUBAL LIGATION      FAMILY HISTORY: Family History  Problem Relation Age of Onset   Heart disease Mother    Heart disease Father    Diabetes Sister    COPD Brother    Diabetes Brother    Parkinson's disease Neg Hx     SOCIAL HISTORY: Social History   Socioeconomic History   Marital status: Married    Spouse name: Fayrene Fearing   Number of children: Not on file   Years of education: Not on file   Highest education level: Not on file  Occupational History   Not on file  Tobacco Use   Smoking status: Never   Smokeless tobacco: Never  Substance and Sexual Activity   Alcohol use: No   Drug use: No   Sexual activity: Not Currently  Other Topics Concern   Not on file  Social History Narrative   Lives with husband and son   Right Handed   Drinks 1-2 cups caffeine daily   Social Determinants of Health   Financial Resource Strain: Not on file  Food Insecurity: Not on file  Transportation Needs: Not on file  Physical Activity: Not on file  Stress: Not on file  Social Connections: Not on file  Intimate Partner Violence: Not on file   PHYSICAL EXAM  Vitals:   07/02/23 1103  BP: (!) 155/58  Pulse: 63  Weight: 134 lb 12.8 oz (61.1 kg)  Height: 5\' 7"  (1.702 m)    Body mass index is 21.11 kg/m.    07/02/2023   11:08 AM 07/17/2022   12:49 PM 02/14/2022    1:29 PM  MMSE - Mini Mental State Exam  Orientation to time 5 3 4   Orientation to Place 5 5 5   Registration 3 3 3   Attention/ Calculation 0 1 1  Recall 3 2 3   Language- name 2 objects 2 2 2   Language- repeat 1 1 1   Language- follow 3 step command 3  3 3  Language- read & follow direction 1 1 1   Write a sentence 1 1 1   Copy design 0 0 0  Total score 24 22 24    Generalized: Well developed, in no acute distress, mild to moderate masking of the face, flat affect, displeased look,  when she talks, squints her eyes Neurological examination  Mentation: Alert, oriented, husband provides the history,  follows exam commands well, her speech is slightly soft/raspy  Cranial nerve II-XII: Pupils were equal round reactive to light. Extraocular movements were full, visual field were full on confrontational test. Facial sensation and strength were normal. Head turning and shoulder shrug  were normal and symmetric. Motor: Good strength all extremities, decreased finger taps mildly bilaterally, no significant rigidity was noted Sensory: Sensory testing is intact to soft touch on all 4 extremities. No evidence of extinction is noted.  Lower legs are sensitive to touch Coordination: Cerebellar testing reveals good finger-nose-finger and heel-to-shin bilaterally, movements are mildly slowed.  No tremor noted. Gait and station:  gait is wide-based, stooped posture, shuffles, uses walker, no freezing noted  DIAGNOSTIC DATA (LABS, IMAGING, TESTING) - I reviewed patient records, labs, notes, testing and imaging myself where available.  Lab Results  Component Value Date   WBC 10.5 09/30/2022   HGB 9.3 (L) 09/30/2022   HCT 26.8 (L) 09/30/2022   MCV 96.1 09/30/2022   PLT 137 (L) 09/30/2022      Component Value Date/Time   NA 134 (L) 09/30/2022 0503   NA 137 03/18/2018 1048   K 3.3 (L) 09/30/2022 0503   CL 103 09/30/2022 0503   CO2 23 09/30/2022 0503   GLUCOSE 94 09/30/2022 0503   BUN 9 09/30/2022 0503   BUN 20 03/18/2018 1048   CREATININE 0.96 09/30/2022 0503   CALCIUM 8.1 (L) 09/30/2022 0503   PROT 5.7 (L) 09/27/2022 1150   PROT 5.8 (L) 03/18/2018 1048   ALBUMIN 3.1 (L) 09/27/2022 1150   ALBUMIN 3.4 (L) 03/18/2018 1048   AST 23 09/27/2022 1150   ALT 5 09/27/2022 1150   ALKPHOS 56 09/27/2022 1150   BILITOT 0.7 09/27/2022 1150   BILITOT 0.3 03/18/2018 1048   GFRNONAA 57 (L) 09/30/2022 0503   GFRAA 49 (L) 04/22/2020 1342   No results found for: "CHOL", "HDL", "LDLCALC",  "LDLDIRECT", "TRIG", "CHOLHDL" No results found for: "HGBA1C" Lab Results  Component Value Date   VITAMINB12 422 07/20/2020   Lab Results  Component Value Date   TSH 1.733 04/10/2021    Margie Ege, AGNP-C, DNP 07/02/2023, 11:30 AM [facility] [address] [place], [place] [address] [phone]

## 2023-07-02 ENCOUNTER — Ambulatory Visit (INDEPENDENT_AMBULATORY_CARE_PROVIDER_SITE_OTHER): Payer: Medicare Other | Admitting: Neurology

## 2023-07-02 ENCOUNTER — Encounter: Payer: Self-pay | Admitting: Neurology

## 2023-07-02 VITALS — BP 155/58 | HR 63 | Ht 67.0 in | Wt 134.8 lb

## 2023-07-02 DIAGNOSIS — R413 Other amnesia: Secondary | ICD-10-CM | POA: Diagnosis not present

## 2023-07-02 DIAGNOSIS — G20C Parkinsonism, unspecified: Secondary | ICD-10-CM

## 2023-07-02 MED ORDER — CARBIDOPA-LEVODOPA 25-100 MG PO TABS: 1.0000 | ORAL_TABLET | Freq: Three times a day (TID) | ORAL | 3 refills | Status: AC

## 2023-07-02 NOTE — Patient Instructions (Addendum)
Will remains off memory medications Continue the Sinemet  Recommend exercise, brain stimulating activity  Consider discussing mood with primary care if irritability Follow up in 6 months

## 2023-07-15 ENCOUNTER — Ambulatory Visit (INDEPENDENT_AMBULATORY_CARE_PROVIDER_SITE_OTHER): Payer: Medicare Other | Admitting: Podiatry

## 2023-07-15 DIAGNOSIS — I739 Peripheral vascular disease, unspecified: Secondary | ICD-10-CM | POA: Diagnosis not present

## 2023-07-15 DIAGNOSIS — B351 Tinea unguium: Secondary | ICD-10-CM | POA: Diagnosis not present

## 2023-07-15 DIAGNOSIS — M79674 Pain in right toe(s): Secondary | ICD-10-CM

## 2023-07-15 DIAGNOSIS — M79675 Pain in left toe(s): Secondary | ICD-10-CM | POA: Diagnosis not present

## 2023-09-23 ENCOUNTER — Encounter: Payer: Medicare Other | Admitting: Podiatry

## 2023-09-24 NOTE — Progress Notes (Signed)
Patient did not show for her scheduled appointment on 09/23/2023

## 2023-12-03 ENCOUNTER — Other Ambulatory Visit: Payer: Self-pay

## 2023-12-03 ENCOUNTER — Emergency Department (HOSPITAL_BASED_OUTPATIENT_CLINIC_OR_DEPARTMENT_OTHER)
Admission: EM | Admit: 2023-12-03 | Discharge: 2023-12-03 | Disposition: A | Discharge status: Home / Self Care | Attending: Emergency Medicine | Admitting: Emergency Medicine

## 2023-12-03 ENCOUNTER — Emergency Department (HOSPITAL_BASED_OUTPATIENT_CLINIC_OR_DEPARTMENT_OTHER)

## 2023-12-03 ENCOUNTER — Encounter (HOSPITAL_BASED_OUTPATIENT_CLINIC_OR_DEPARTMENT_OTHER): Payer: Self-pay | Admitting: Emergency Medicine

## 2023-12-03 DIAGNOSIS — S61411A Laceration without foreign body of right hand, initial encounter: Secondary | ICD-10-CM | POA: Diagnosis not present

## 2023-12-03 DIAGNOSIS — S51011A Laceration without foreign body of right elbow, initial encounter: Secondary | ICD-10-CM | POA: Insufficient documentation

## 2023-12-03 DIAGNOSIS — Z91040 Latex allergy status: Secondary | ICD-10-CM | POA: Diagnosis not present

## 2023-12-03 DIAGNOSIS — W19XXXA Unspecified fall, initial encounter: Secondary | ICD-10-CM | POA: Diagnosis not present

## 2023-12-03 DIAGNOSIS — S81011A Laceration without foreign body, right knee, initial encounter: Secondary | ICD-10-CM | POA: Insufficient documentation

## 2023-12-03 DIAGNOSIS — T148XXA Other injury of unspecified body region, initial encounter: Secondary | ICD-10-CM

## 2023-12-03 DIAGNOSIS — I5032 Chronic diastolic (congestive) heart failure: Secondary | ICD-10-CM | POA: Insufficient documentation

## 2023-12-03 DIAGNOSIS — S51811A Laceration without foreign body of right forearm, initial encounter: Secondary | ICD-10-CM | POA: Insufficient documentation

## 2023-12-03 DIAGNOSIS — Y92194 Driveway of other specified residential institution as the place of occurrence of the external cause: Secondary | ICD-10-CM | POA: Diagnosis not present

## 2023-12-03 DIAGNOSIS — S59901A Unspecified injury of right elbow, initial encounter: Secondary | ICD-10-CM | POA: Diagnosis present

## 2023-12-03 DIAGNOSIS — S61412A Laceration without foreign body of left hand, initial encounter: Secondary | ICD-10-CM | POA: Insufficient documentation

## 2023-12-03 DIAGNOSIS — Z79899 Other long term (current) drug therapy: Secondary | ICD-10-CM | POA: Insufficient documentation

## 2023-12-03 LAB — COMPREHENSIVE METABOLIC PANEL WITH GFR
ALT: <5 U/L (ref 0–44)
AST: 17 U/L (ref 15–41)
Albumin: 3.7 g/dL (ref 3.5–5.0)
Alkaline Phosphatase: 60 U/L (ref 38–126)
Anion gap: 12 (ref 5–15)
BUN: 25 mg/dL — ABNORMAL HIGH (ref 8–23)
CO2: 22 mmol/L (ref 22–32)
Calcium: 9.7 mg/dL (ref 8.9–10.3)
Chloride: 105 mmol/L (ref 98–111)
Creatinine, Ser: 1.16 mg/dL — ABNORMAL HIGH (ref 0.44–1.00)
GFR, Estimated: 45 mL/min — ABNORMAL LOW (ref >60)
Glucose, Bld: 99 mg/dL (ref 70–99)
Potassium: 4.1 mmol/L (ref 3.5–5.1)
Sodium: 139 mmol/L (ref 135–145)
Total Bilirubin: 0.5 mg/dL (ref 0.0–1.2)
Total Protein: 6.4 g/dL — ABNORMAL LOW (ref 6.5–8.1)

## 2023-12-03 LAB — URINALYSIS, W/ REFLEX TO CULTURE (INFECTION SUSPECTED)
Bilirubin Urine: NEGATIVE
Glucose, UA: NEGATIVE mg/dL
Hgb urine dipstick: NEGATIVE
Ketones, ur: NEGATIVE mg/dL
Leukocytes,Ua: NEGATIVE
Nitrite: NEGATIVE
Protein, ur: NEGATIVE mg/dL
Specific Gravity, Urine: 1.015 (ref 1.005–1.030)
pH: 5.5 (ref 5.0–8.0)

## 2023-12-03 LAB — CBC WITH DIFFERENTIAL/PLATELET
Abs Immature Granulocytes: 0.03 10*3/uL (ref 0.00–0.07)
Basophils Absolute: 0.1 10*3/uL (ref 0.0–0.1)
Basophils Relative: 1 %
Eosinophils Absolute: 0.2 10*3/uL (ref 0.0–0.5)
Eosinophils Relative: 3 %
HCT: 30.6 % — ABNORMAL LOW (ref 36.0–46.0)
Hemoglobin: 10.3 g/dL — ABNORMAL LOW (ref 12.0–15.0)
Immature Granulocytes: 0 %
Lymphocytes Relative: 24 %
Lymphs Abs: 1.9 10*3/uL (ref 0.7–4.0)
MCH: 33 pg (ref 26.0–34.0)
MCHC: 33.7 g/dL (ref 30.0–36.0)
MCV: 98.1 fL (ref 80.0–100.0)
Monocytes Absolute: 0.8 10*3/uL (ref 0.1–1.0)
Monocytes Relative: 10 %
Neutro Abs: 4.9 10*3/uL (ref 1.7–7.7)
Neutrophils Relative %: 62 %
Platelets: 148 10*3/uL — ABNORMAL LOW (ref 150–400)
RBC: 3.12 MIL/uL — ABNORMAL LOW (ref 3.87–5.11)
RDW: 13.3 % (ref 11.5–15.5)
WBC: 7.9 10*3/uL (ref 4.0–10.5)
nRBC: 0 % (ref 0.0–0.2)

## 2023-12-03 LAB — TSH: TSH: 0.61 u[IU]/mL (ref 0.350–4.500)

## 2023-12-03 LAB — PRO BRAIN NATRIURETIC PEPTIDE: Pro Brain Natriuretic Peptide: 774 pg/mL — ABNORMAL HIGH (ref <300.0)

## 2023-12-03 NOTE — ED Notes (Signed)
 Cleaned wounds with wound cleanser then applied bacitracin and clean non adherent gauzes to right forearm and both hands.

## 2023-12-03 NOTE — Care Management (Signed)
 Transition of Care [facility]) - Emergency Department Mini Assessment   Patient Details  Name: Regina [name] MRN: [id number] Date of Birth: January 20, 1933  [facility] CM/SW Contact:    Naoma Bacca, RN Phone Number: 12/03/2023, 7:05 PM   Clinical Narrative: Received secure chat from MD who reports patient had a fall today and family does not feel comfortable taking her home, requesting SNF placement. Per chart review patient currently at [facility] for recent fall today. Patient insurance payor requires an inpatient admission of 3 midnights for short term SNF. Per chart review patient does not have any recent inpatient stays and not eligible for [facility]. This RNCM spoke with patient's daughter Adah Acron who reports patient resides with her father who recently had COVID and unable to assist patient with care. Adah Acron reports patient's PCP Dr Arvin Laundry is working on coordinating SNF and patient currently has HHPT/OT/HHRN with [facility]. This RNCM explained Medicare requirement for 3 MNs or paying out of pocket for SNF. This RNCM made referral to [facility] with [facility] and notified [facility] to see if any additional [facility] services are available.      No additional TOC needs  ED Mini Assessment: What brought you to the Emergency Department? : Pt fell in driveway today;  Barriers to Discharge: Barriers Resolved  Barrier interventions: Educated on avaialble services for SNF placment  Means of departure: Car  Interventions which prevented an admission or readmission: SNF Placement    Patient Contact and Communications     Spoke with: Cody Das (daughter) Contact Date: 12/03/23,     Contact Phone Number: [phone]    Patient states their goals for this hospitalization and ongoing recovery are:: to obtain short term SNF placment CMS Medicare.gov Compare Post Acute Care list provided to:: Patient Represenative (must comment) Choice offered to / list presented to : Adult  Children  Admission diagnosis:  fall Patient Active Problem List   Diagnosis Date Noted   Acute colitis 09/27/2022   Parkinsonism (HCC) 02/14/2022   UTI (urinary tract infection) 04/09/2021   Gait disorder 12/28/2020   Malnutrition of moderate degree 03/10/2018   Acute on chronic diastolic CHF (congestive heart failure) (HCC)    Hip pain    Acute cystitis without hematuria    Delirium    Memory loss    Ataxia 03/09/2018   Weakness generalized 03/09/2018   Acute renal failure superimposed on chronic kidney disease (HCC) 03/09/2018   Generalized weakness 03/09/2018   Internal hemorrhoids with complication 08/03/2014   Ulcer of lower limb (HCC) 08/02/2014   Chronic diastolic heart failure (HCC) 08/01/2014   Right bundle branch block 08/01/2014   Essential hypertension 08/01/2014   Hypothyroidism 08/01/2014   Rectal bleeding 07/31/2014   Hypertension 07/31/2014   GERD (gastroesophageal reflux disease) 07/31/2014   Other specified anemias 07/31/2014   Lower GI bleeding    Faintness    Syncope 07/30/2014   Varicose veins of bilateral lower extremities with other complications 07/05/2013   PCP:  Edda Goo, MD Pharmacy:   [id number] [address] ([address]), [place] - [address] [place] ([place]) [place] [address] Phone: [phone] Fax: [phone]

## 2023-12-03 NOTE — ED Triage Notes (Signed)
 Pt fell in driveway today; c/o pain to RUE (hand, forearm, elbow), LT pinky finger, RT knee; does not take blood thinners; did not hit head

## 2023-12-03 NOTE — ED Provider Notes (Signed)
 [place] EMERGENCY DEPARTMENT AT [facility] Provider Note   CSN: [id number] Arrival date & time: 12/03/23  1342     History  Chief Complaint  Patient presents with   Regina [name] is a 89 y.o. female.  The history is provided by the patient, medical records and a relative. No language interpreter was used.  Fall This is a recurrent problem. The current episode started 1 to 2 hours ago. The problem occurs rarely. The problem has not changed since onset.Pertinent negatives include no chest pain, no abdominal pain, no headaches and no shortness of breath. Nothing aggravates the symptoms. Nothing relieves the symptoms. She has tried nothing for the symptoms. The treatment provided no relief.       Home Medications Prior to Admission medications   Medication Sig Start Date End Date Taking? Authorizing Provider  calcitRIOL  (ROCALTROL ) 0.25 MCG capsule Take 0.25 mcg by mouth daily.    [provider]  carbidopa -levodopa  (SINEMET ) 25-100 MG tablet Take 1 tablet by mouth 3 (three) times daily. 07/02/23   Wess Hammed, NP  carvedilol  (COREG ) 6.25 MG tablet Take 1 tablet (6.25 mg total) by mouth 2 (two) times daily with a meal. 09/30/22   Aram Knights, MD  senna-docusate (SENOKOT-S) 8.6-50 MG tablet Take 1 tablet by mouth daily as needed for mild constipation. Patient taking differently: Take 1 tablet by mouth as needed for mild constipation. 04/12/21   Joette Mustard, MD  SYNTHROID  75 MCG tablet Take 75 mcg by mouth daily before breakfast.    [provider]  SYSTANE ULTRA PF 0.4-0.3 % SOLN Place 1 drop into both eyes in the morning, at noon, and at bedtime.    [provider]  valsartan (DIOVAN) 40 MG tablet Take 40 mg by mouth daily. 10/15/22   [provider]      Allergies    Tape, Penicillins, and Latex    Review of Systems   Review of Systems  Constitutional:  Negative for chills, fatigue and fever.  HENT:  Negative for  congestion.   Respiratory:  Negative for cough, chest tightness, shortness of breath and wheezing.   Cardiovascular:  Negative for chest pain, palpitations and leg swelling.  Gastrointestinal:  Negative for abdominal pain, constipation, diarrhea, nausea and vomiting.  Genitourinary:  Negative for dysuria.  Musculoskeletal:  Negative for back pain, neck pain and neck stiffness.  Skin:  Positive for wound.  Neurological:  Negative for dizziness, weakness, light-headedness, numbness and headaches.  Psychiatric/Behavioral:  Negative for agitation.   All other systems reviewed and are negative.   Physical Exam Updated Vital Signs BP (!) 166/42   Pulse (!) 51   Temp 97.7 F (36.5 C)   Resp 18   Ht 5\' 7"  (1.702 m)   Wt 61.1 kg   SpO2 100%   BMI 21.10 kg/m  Physical Exam Vitals and nursing note reviewed.  Constitutional:      General: She is not in acute distress.    Appearance: She is well-developed. She is not ill-appearing, toxic-appearing or diaphoretic.  HENT:     Head: Normocephalic and atraumatic.     Nose: No congestion or rhinorrhea.     Mouth/Throat:     Mouth: Mucous membranes are moist.     Pharynx: No oropharyngeal exudate or posterior oropharyngeal erythema.  Eyes:     Extraocular Movements: Extraocular movements intact.     Conjunctiva/sclera: Conjunctivae normal.     Pupils: Pupils are equal, round,  and reactive to light.  Cardiovascular:     Rate and Rhythm: Normal rate and regular rhythm.     Heart sounds: No murmur heard. Pulmonary:     Effort: Pulmonary effort is normal. No respiratory distress.     Breath sounds: Normal breath sounds. No wheezing, rhonchi or rales.  Chest:     Chest wall: No tenderness.  Abdominal:     General: Abdomen is flat.     Palpations: Abdomen is soft.     Tenderness: There is no abdominal tenderness. There is no guarding or rebound.  Musculoskeletal:        General: Tenderness present. No swelling.     Cervical back: Neck  supple. No tenderness.     Comments: Multiple superficial skin tears to right elbow, along with on her right forearm, swelling on her right fifth digit base, and 1 in her left fifth digit base.  Also has a small skin tear to her right knee.  Otherwise mild tenderness around these injuries but no deep tenderness present.  She can move her extremities and has good sensation and strength and pulses.  Otherwise has a bandage on her left shin from a dermatology visit that she does not want me to take down and is not bothering her.  She also has a bandage on her left forearm from previous fall that is reportedly doing well and does not meet the takedown.  Skin:    General: Skin is warm and dry.     Capillary Refill: Capillary refill takes less than 2 seconds.     Findings: Bruising present. No erythema.  Neurological:     Mental Status: She is alert. Mental status is at baseline.     Sensory: No sensory deficit.     Motor: No weakness.  Psychiatric:        Mood and Affect: Mood normal.     ED Results / Procedures / Treatments   Labs (all labs ordered are listed, but only abnormal results are displayed) Labs Reviewed  CBC WITH DIFFERENTIAL/PLATELET - Abnormal; Notable for the following components:      Result Value   RBC 3.12 (*)    Hemoglobin 10.3 (*)    HCT 30.6 (*)    Platelets 148 (*)    All other components within normal limits  COMPREHENSIVE METABOLIC PANEL WITH GFR - Abnormal; Notable for the following components:   BUN 25 (*)    Creatinine, Ser 1.16 (*)    Total Protein 6.4 (*)    GFR, Estimated 45 (*)    All other components within normal limits  URINALYSIS, W/ REFLEX TO CULTURE (INFECTION SUSPECTED) - Abnormal; Notable for the following components:   Bacteria, UA RARE (*)    All other components within normal limits  PRO BRAIN NATRIURETIC PEPTIDE - Abnormal; Notable for the following components:   Pro Brain Natriuretic Peptide 774.0 (*)    All other components within normal  limits  TSH    EKG None  Radiology DG Hand Complete Right Result Date: 12/03/2023 CLINICAL DATA:  Marvell Slider in driveway today. EXAM: RIGHT HAND - COMPLETE 3+ VIEW COMPARISON:  None Available. FINDINGS: There is diffuse decreased bone mineralization. Minimal 1 mm ulnar positive variance. Mild adjacent proximal medial lunate subchondral degenerative cystic change. Moderate radiocarpal joint space narrowing. Mild triscaphe joint space narrowing and peripheral osteophytosis. Mild second through fifth DIP joint space narrowing and peripheral osteophytosis. Mild lateral thumb interphalangeal joint space narrowing. No acute fracture is seen. No dislocation.  IMPRESSION: 1. No acute fracture. 2. Mild-to-moderate osteoarthritis. Electronically Signed   By: Bertina Broccoli M.D.   On: 12/03/2023 15:32   DG Forearm Right Result Date: 12/03/2023 CLINICAL DATA:  Marvell Slider in driveway today. Right hand, forearm, and elbow pain. EXAM: RIGHT FOREARM - 2 VIEW COMPARISON:  None Available. FINDINGS: Mildly decreased bone mineralization. Normal position of the distal anterior humeral fat pad without evidence of elbow joint effusion. Moderate in radiocarpal joint space narrowing. No acute fracture is seen. No dislocation. There is nonspecific round likely calcific density just deep to the skin surface of the volar, slightly lateral/radial aspect of the elbow. IMPRESSION: 1. No acute fracture. 2. Moderate radiocarpal joint space narrowing. Electronically Signed   By: Bertina Broccoli M.D.   On: 12/03/2023 15:31   DG Knee Complete 4 Views Right Result Date: 12/03/2023 CLINICAL DATA:  Pain after fall EXAM: RIGHT KNEE - COMPLETE 4 VIEW COMPARISON:  None Available. FINDINGS: No fracture or dislocation. No joint effusion on lateral view. Preserved joint spaces. Osteopenia. Slight chondrocalcinosis of the medial and lateral compartment. IMPRESSION: Chondrocalcinosis. Electronically Signed   By: Adrianna Horde M.D.   On: 12/03/2023 15:09     Procedures Procedures    Medications Ordered in ED Medications - No data to display  ED Course/ Medical Decision Making/ A&P                                 Medical Decision Making Amount and/or Complexity of Data Reviewed Labs: ordered. Radiology: ordered.    GEONNA LOCKYER is a 89 y.o. female with a past medical history significant for hypertension, hypothyroidism, CHF, GERD, previous GI bleeding, Barrett's esophagus, and lower limb ulcer managed by dermatology with frequent falls who presents with a fall.  According to patient and family, patient had a fall this afternoon while getting ready to go to run some errands.  She reportedly had a walker in the trunk and was going to it and had a mechanical fall.  She denied any preceding symptoms such as palpitations chest pain shortness breath or lightheadedness.  She reports she has had falls recently but otherwise feels she is at her baseline.  She otherwise specifically denies fevers, chills, congestion, cough, nausea, vomiting, constipation, diarrhea, or urinary changes.  She reports she has a bandage on her left shin from her dermatologist and her left wrist from a previous fall.  She has wound care that comes and helps manage her and dresses her wounds.  Patient said that she fell and did not hit her head or neck.  She is having no pain in her chest abdomen or back.  She is only complaining of pain in her right elbow, right forearm, right hand, right knee, and left fifth digit.  Patient had wound care provided by family and brought her in.  On my exam, patient does have multiple skin tears.  Primarily on her right elbow, a long straight wound on her right forearm, and 1 on her right base of the fifth digit.  She also has 1 on her left fifth digit and a small 1 on the right knee.  She had some mild tenderness but no crepitance appreciated.  She had intact sensation strength and pulse in extremities.  No other significant injury seen.   Pupil symmetric and reactive normal extract movements.  Lungs clear.  Chest nontender.  Abdomen nontender.  Neck nontender.  Patient otherwise well-appearing.  We did shared Szymon conversation and patient and family do not want more extensive medical workup and just want x-rays to make sure nothing is broken.  They think her tetanus shot is up-to-date and I verified that it was last Tdap in 2023.  Patient does not think any these wounds need stitches and they all appear to be skin tears.  Will get the x-rays that were ordered in triage of the right hand, right forearm, and right knee and she does not think she is one of her left fifth digit and I agree.  If x-rays reassuring, anticipate washing and skin tear management with wound bandages and close PCP follow-up.  Patient is scheduled to talk to her PCP about ambulation problems and gait assistance.  Anticipate discharge after wound care and x-rays.  3:52 PM X-rays returned without acute fractures.  Suspect skin tears as the primary problem.  I went to inform the patient and family about this and they raise concerns about her being safe at home now that she has skin tears on both of her hands thus cannot grip her walker safely and has proven that she has had multiple falls over the last week.  They do not feel safe taking her home and think she needs either admission to the hospital or placement at a rehab facility.  We discussed that she did not have any fractures and only has superficial injuries however we do acknowledge that with her knee pain and now bilateral hand injuries that make it difficult he is a walker, she is prone for more falls.  We will get some screening blood work and urinalysis to look for other causes that may have led to her fall today however anticipate this will be reassuring and she will likely need to be transferred to [facility] to see physical therapy and case management to ultimately be a boarder to talk about placement  for falls and mobility help.  6:35 PM Labs returned without concerning finding that would need intervention and today.  Will call and speak with case management and figure out a plan.  Will make sure the wounds get dressed appropriately and clean.  Anticipate transfer to be seen by physical therapy and placement.  6:42 PM Spoke to the transition of care team who informed I need to place a PT eval and treat order and can transfer her to board until assessment and likely rehab placement.  7:23 PM Spoke to the [employer] case Production designer, theatre/television/film who spoke to family and they are amenable to going home now and the case management team spoke to another home health service to help with the patient.  Will plan for discharge home and outpatient PCP follow-up.         Final Clinical Impression(s) / ED Diagnoses Final diagnoses:  Fall, initial encounter  Multiple skin tears    Rx / DC Orders ED Discharge Orders     None       Clinical Impression: 1. Fall, initial encounter   2. Multiple skin tears     Disposition: Discharge  Condition: Good  I have discussed the results, Dx and Tx plan with the pt(& family if present). He/she/they expressed understanding and agree(s) with the plan. Discharge instructions discussed at great length. Strict return precautions discussed and pt &/or family have verbalized understanding of the instructions. No further questions at time of discharge.    Discharge Medication List as of 12/03/2023  7:33 PM      Follow Up:  Edda Goo, MD [address] [place] [address] [phone]     [facility] Emergency Department at [facility] [address] [place]  [address] [phone]       Nicoya Friel, Marine Sia, MD 12/03/23 [phone]

## 2023-12-03 NOTE — Discharge Instructions (Addendum)
 [facility] [phone]  Your history, exam, workup today revealed no acute fractures after your fall but you did have multiple skin tears.  Your tetanus shot was up-to-date.  Your wounds were cleaned and dressed so please allow the wound team and home health to help monitor them.  Please watch for signs and symptoms of infection.  The rest of your labs all seem similar to prior and we feel you are safe for discharge home and did not find a reason to admit you.  We had a shared decision-making conversation about boarding for placement versus discharge and feel it is appropriate for discharge at this time.  If any symptoms change or worsen acutely, please return to the nearest emergency department for further evaluation.

## 2023-12-04 ENCOUNTER — Telehealth: Payer: Self-pay

## 2023-12-04 NOTE — Telephone Encounter (Signed)
 This RNCM spoke with Regina [name] with [facility] regarding adding additional HH services if possible. Regina [name] will review for increased frequency of HH services.   This RNCM also sent referral to [facility] with [facility] re: potential for ALF and LTC. Regina [name] will follow up with patient's daughter Regina [name] to determine if he's able to provide any assistance.    No additional TOC needs

## 2023-12-15 ENCOUNTER — Other Ambulatory Visit: Payer: Self-pay | Admitting: Neurology

## 2023-12-17 ENCOUNTER — Encounter (HOSPITAL_COMMUNITY): Payer: Self-pay

## 2023-12-17 ENCOUNTER — Emergency Department (HOSPITAL_COMMUNITY)

## 2023-12-17 ENCOUNTER — Other Ambulatory Visit: Payer: Self-pay

## 2023-12-17 ENCOUNTER — Observation Stay (HOSPITAL_COMMUNITY)
Admission: EM | Admit: 2023-12-17 | Discharge: 2023-12-19 | Disposition: A | Discharge status: Skilled Nursing Facility | Attending: Family Medicine | Admitting: Family Medicine

## 2023-12-17 DIAGNOSIS — N1831 Chronic kidney disease, stage 3a: Secondary | ICD-10-CM | POA: Insufficient documentation

## 2023-12-17 DIAGNOSIS — R300 Dysuria: Secondary | ICD-10-CM | POA: Insufficient documentation

## 2023-12-17 DIAGNOSIS — Z91040 Latex allergy status: Secondary | ICD-10-CM | POA: Insufficient documentation

## 2023-12-17 DIAGNOSIS — R001 Bradycardia, unspecified: Secondary | ICD-10-CM | POA: Insufficient documentation

## 2023-12-17 DIAGNOSIS — R4182 Altered mental status, unspecified: Secondary | ICD-10-CM | POA: Diagnosis present

## 2023-12-17 DIAGNOSIS — G20C Parkinsonism, unspecified: Secondary | ICD-10-CM | POA: Insufficient documentation

## 2023-12-17 DIAGNOSIS — I129 Hypertensive chronic kidney disease with stage 1 through stage 4 chronic kidney disease, or unspecified chronic kidney disease: Secondary | ICD-10-CM | POA: Diagnosis not present

## 2023-12-17 DIAGNOSIS — E039 Hypothyroidism, unspecified: Secondary | ICD-10-CM | POA: Diagnosis not present

## 2023-12-17 DIAGNOSIS — I951 Orthostatic hypotension: Secondary | ICD-10-CM | POA: Insufficient documentation

## 2023-12-17 DIAGNOSIS — R42 Dizziness and giddiness: Secondary | ICD-10-CM | POA: Diagnosis not present

## 2023-12-17 DIAGNOSIS — R55 Syncope and collapse: Principal | ICD-10-CM

## 2023-12-17 LAB — BASIC METABOLIC PANEL WITH GFR
Anion gap: 7 (ref 5–15)
BUN: 26 mg/dL — ABNORMAL HIGH (ref 8–23)
CO2: 25 mmol/L (ref 22–32)
Calcium: 8.8 mg/dL — ABNORMAL LOW (ref 8.9–10.3)
Chloride: 104 mmol/L (ref 98–111)
Creatinine, Ser: 1.36 mg/dL — ABNORMAL HIGH (ref 0.44–1.00)
GFR, Estimated: 37 mL/min — ABNORMAL LOW (ref >60)
Glucose, Bld: 135 mg/dL — ABNORMAL HIGH (ref 70–99)
Potassium: 5.1 mmol/L (ref 3.5–5.1)
Sodium: 136 mmol/L (ref 135–145)

## 2023-12-17 LAB — CBC
HCT: 30.3 % — ABNORMAL LOW (ref 36.0–46.0)
Hemoglobin: 10 g/dL — ABNORMAL LOW (ref 12.0–15.0)
MCH: 32.6 pg (ref 26.0–34.0)
MCHC: 33 g/dL (ref 30.0–36.0)
MCV: 98.7 fL (ref 80.0–100.0)
Platelets: 165 10*3/uL (ref 150–400)
RBC: 3.07 MIL/uL — ABNORMAL LOW (ref 3.87–5.11)
RDW: 13.2 % (ref 11.5–15.5)
WBC: 8.7 10*3/uL (ref 4.0–10.5)
nRBC: 0 % (ref 0.0–0.2)

## 2023-12-17 LAB — TSH: TSH: 3.577 u[IU]/mL (ref 0.350–4.500)

## 2023-12-17 LAB — TROPONIN I (HIGH SENSITIVITY)
Troponin I (High Sensitivity): 7 ng/L (ref <18)
Troponin I (High Sensitivity): 8 ng/L (ref <18)

## 2023-12-17 MED ORDER — ACETAMINOPHEN 325 MG PO TABS
650.0000 mg | ORAL_TABLET | Freq: Four times a day (QID) | ORAL | Status: DC | PRN
  Administered 2023-12-18 – 2023-12-19 (×2): 650 mg via ORAL
  Filled 2023-12-17 (×2): qty 2

## 2023-12-17 MED ORDER — ACETAMINOPHEN 650 MG RE SUPP: 650.0000 mg | Freq: Four times a day (QID) | RECTAL | Status: DC | PRN

## 2023-12-17 MED ORDER — ONDANSETRON HCL 4 MG/2ML IJ SOLN: 4.0000 mg | Freq: Four times a day (QID) | INTRAMUSCULAR | Status: DC | PRN

## 2023-12-17 MED ORDER — AMLODIPINE BESYLATE 5 MG PO TABS
5.0000 mg | ORAL_TABLET | Freq: Every day | ORAL | Status: DC
  Administered 2023-12-17 – 2023-12-18 (×2): 5 mg via ORAL
  Filled 2023-12-17 (×2): qty 1

## 2023-12-17 MED ORDER — ENOXAPARIN SODIUM 30 MG/0.3ML IJ SOSY
30.0000 mg | PREFILLED_SYRINGE | INTRAMUSCULAR | Status: DC
  Administered 2023-12-17: 30 mg via SUBCUTANEOUS
  Filled 2023-12-17 (×2): qty 0.3

## 2023-12-17 MED ORDER — SODIUM CHLORIDE 0.9% FLUSH
3.0000 mL | Freq: Two times a day (BID) | INTRAVENOUS | Status: DC
  Administered 2023-12-18 – 2023-12-19 (×2): 3 mL via INTRAVENOUS

## 2023-12-17 MED ORDER — AMLODIPINE BESYLATE 2.5 MG PO TABS: 2.5000 mg | ORAL_TABLET | Freq: Every day | ORAL | Status: DC

## 2023-12-17 MED ORDER — IRBESARTAN 75 MG PO TABS: 75.0000 mg | ORAL_TABLET | Freq: Every day | ORAL | Status: DC

## 2023-12-17 MED ORDER — ONDANSETRON HCL 4 MG PO TABS: 4.0000 mg | ORAL_TABLET | Freq: Four times a day (QID) | ORAL | Status: DC | PRN

## 2023-12-17 MED ORDER — SODIUM CHLORIDE 0.9 % IV SOLN: INTRAVENOUS | Status: AC

## 2023-12-17 MED ORDER — SODIUM CHLORIDE 0.9 % IV BOLUS
500.0000 mL | Freq: Once | INTRAVENOUS | Status: AC
  Administered 2023-12-17: 500 mL via INTRAVENOUS

## 2023-12-17 MED ORDER — LEVOTHYROXINE SODIUM 75 MCG PO TABS
75.0000 ug | ORAL_TABLET | Freq: Every day | ORAL | Status: DC
  Administered 2023-12-18 – 2023-12-19 (×2): 75 ug via ORAL
  Filled 2023-12-17 (×2): qty 1

## 2023-12-17 MED ORDER — CARBIDOPA-LEVODOPA 25-100 MG PO TABS
1.0000 | ORAL_TABLET | Freq: Three times a day (TID) | ORAL | Status: DC
  Administered 2023-12-17 – 2023-12-19 (×5): 1 via ORAL
  Filled 2023-12-17 (×6): qty 1

## 2023-12-17 NOTE — ED Triage Notes (Addendum)
 Per EMS, Pt, from home, presents after having a syncopal episode after using the restroom.  Pt was found to be bradycardic in the 40s and BP 40/20.    Pt c/o chronic back and hip pain.   1mg  Atropine and 500mL NS given en route.    Pt had syncopal episode in walker chair.

## 2023-12-17 NOTE — ED Provider Notes (Signed)
 [place] EMERGENCY DEPARTMENT AT [facility] Provider Note   CSN: [id number] Arrival date & time: 12/17/23  1104     History  Chief Complaint  Patient presents with   Altered Mental Status   Loss of Consciousness    Regina [name] is a 89 y.o. female.  Pt is a 89 yo female with pmhx significant for parkinson's disease, hypothyroidism, and htn.  Pt was in the bathroom and had a syncopal event.  The home health aide was with her and helped her into her chair.  She did not fall or hit her head.  She was still unresponsive when EMS arrived (10-15 min after call) and she was bradycardic with HR of 40.  Picture of strip in the media section. They gave her 1 mg of atropine and she aroused.  She denies any cp or sob.         Home Medications Prior to Admission medications   Medication Sig Start Date End Date Taking? Authorizing Provider  amLODipine (NORVASC) 2.5 MG tablet Take 2.5 mg by mouth at bedtime. 12/02/23  Yes [provider]  calcitRIOL  (ROCALTROL ) 0.25 MCG capsule Take 0.25 mcg by mouth daily.   Yes [provider]  carbidopa -levodopa  (SINEMET ) 25-100 MG tablet Take 1 tablet by mouth 3 (three) times daily. 07/02/23  Yes Wess Hammed, NP  carvedilol  (COREG ) 6.25 MG tablet Take 1 tablet (6.25 mg total) by mouth 2 (two) times daily with a meal. 09/30/22  Yes Aram Knights, MD  cephALEXin  (KEFLEX ) 250 MG capsule Take 250 mg by mouth daily. 10/28/23  Yes [provider]  GEMTESA 75 MG TABS Take 1 tablet by mouth daily. 11/05/23  Yes [provider]  SYNTHROID  75 MCG tablet Take 75 mcg by mouth daily before breakfast.   Yes [provider]  SYSTANE ULTRA PF 0.4-0.3 % SOLN Place 1 drop into both eyes in the morning, at noon, and at bedtime.   Yes [provider]  valsartan (DIOVAN) 40 MG tablet Take 40 mg by mouth daily. 10/15/22  Yes [provider]  senna-docusate (SENOKOT-S) 8.6-50 MG tablet Take 1 tablet by mouth  daily as needed for mild constipation. Patient not taking: Reported on 12/17/2023 04/12/21   Joette Mustard, MD      Allergies    Tape, Penicillins, and Latex    Review of Systems   Review of Systems  All other systems reviewed and are negative.   Physical Exam Updated Vital Signs BP (!) 145/55   Pulse (!) 52   Temp 97.7 F (36.5 C) (Oral)   Resp 14   Ht 5\' 7"  (1.702 m)   Wt 60.8 kg   SpO2 100%   BMI 20.99 kg/m  Physical Exam Vitals and nursing note reviewed.  Constitutional:      Appearance: Normal appearance.  HENT:     Head: Normocephalic and atraumatic.     Right Ear: External ear normal.     Left Ear: External ear normal.     Nose: Nose normal.     Mouth/Throat:     Mouth: Mucous membranes are moist.     Pharynx: Oropharynx is clear.  Eyes:     Extraocular Movements: Extraocular movements intact.     Conjunctiva/sclera: Conjunctivae normal.     Pupils: Pupils are equal, round, and reactive to light.  Cardiovascular:     Rate and Rhythm: Regular rhythm. Bradycardia present.     Pulses: Normal pulses.     Heart  sounds: Normal heart sounds.  Pulmonary:     Effort: Pulmonary effort is normal.     Breath sounds: Normal breath sounds.  Abdominal:     General: Abdomen is flat. Bowel sounds are normal.     Palpations: Abdomen is soft.  Musculoskeletal:        General: Normal range of motion.     Cervical back: Normal range of motion and neck supple.  Skin:    General: Skin is warm.     Capillary Refill: Capillary refill takes less than 2 seconds.     Comments: New skin tear left forearm.  Multiple healing skin tears  Neurological:     General: No focal deficit present.     Mental Status: She is alert and oriented to person, place, and time.     Motor: Tremor present.  Psychiatric:        Mood and Affect: Mood normal.        Behavior: Behavior normal.     ED Results / Procedures / Treatments   Labs (all labs ordered are listed, but only abnormal results  are displayed) Labs Reviewed  BASIC METABOLIC PANEL WITH GFR - Abnormal; Notable for the following components:      Result Value   Glucose, Bld 135 (*)    BUN 26 (*)    Creatinine, Ser 1.36 (*)    Calcium 8.8 (*)    GFR, Estimated 37 (*)    All other components within normal limits  CBC - Abnormal; Notable for the following components:   RBC 3.07 (*)    Hemoglobin 10.0 (*)    HCT 30.3 (*)    All other components within normal limits  TSH  URINALYSIS, ROUTINE W REFLEX MICROSCOPIC  CBC  CREATININE, SERUM  TSH  TROPONIN I (HIGH SENSITIVITY)  TROPONIN I (HIGH SENSITIVITY)    EKG EKG Interpretation Date/Time:  Wednesday Dec 17 2023 11:19:15 EDT Ventricular Rate:  57 PR Interval:    QRS Duration:  165 QT Interval:  532 QTC Calculation: 519 R Axis:   65  Text Interpretation: Normal sinus rhythm Right bundle branch block Artifact in lead(s) I II III aVR aVL aVF V1 V2 No significant change since last tracing Confirmed by Sueellen Emery [id number]) on 12/17/2023 12:57:49 PM  Radiology DG Chest Port 1 View Result Date: 12/17/2023 CLINICAL DATA:  Syncope. EXAM: PORTABLE CHEST 1 VIEW COMPARISON:  April 09, 2021. FINDINGS: The heart size and mediastinal contours are within normal limits. Both lungs are clear. The visualized skeletal structures are unremarkable. IMPRESSION: No active disease. Electronically Signed   By: Rosalene Colon M.D.   On: 12/17/2023 14:16    Procedures Procedures    Medications Ordered in ED Medications  amLODipine (NORVASC) tablet 2.5 mg (has no administration in time range)  carbidopa -levodopa  (SINEMET  IR) 25-100 MG per tablet immediate release 1 tablet (has no administration in time range)  levothyroxine  (SYNTHROID ) tablet 75 mcg (has no administration in time range)  enoxaparin  (LOVENOX ) injection 30 mg (has no administration in time range)  sodium chloride  flush (NS) 0.9 % injection 3 mL (has no administration in time range)  0.9 %  sodium chloride   infusion (has no administration in time range)  acetaminophen  (TYLENOL ) tablet 650 mg (has no administration in time range)    Or  acetaminophen  (TYLENOL ) suppository 650 mg (has no administration in time range)  ondansetron  (ZOFRAN ) tablet 4 mg (has no administration in time range)    Or  ondansetron  (ZOFRAN ) injection  4 mg (has no administration in time range)  sodium chloride  0.9 % bolus 500 mL (500 mLs Intravenous New Bag/Given 12/17/23 1557)    ED Course/ Medical Decision Making/ A&P                                 Medical Decision Making Amount and/or Complexity of Data Reviewed Labs: ordered. Radiology: ordered.  Risk Decision regarding hospitalization.   This patient presents to the ED for concern of syncope, this involves an extensive number of treatment options, and is a complaint that carries with it a high risk of complications and morbidity.  The differential diagnosis includes bradycardia, electrolyte abn, anemia   Co morbidities that complicate the patient evaluation  parkinson's disease, hypothyroidism, and htn   Additional history obtained:  Additional history obtained from epic chart review External records from outside source obtained and reviewed including EMS Report   Lab Tests:  I Ordered, and personally interpreted labs.  The pertinent results include:  cbc with hgb 10 (stable), bmp with cr 1.36 (stable), trop nl, TSH 3.577   Imaging Studies ordered:  I ordered imaging studies including cxr and CT head I independently visualized and interpreted imaging which showed No active disease.  I agree with the radiologist interpretation   Cardiac Monitoring:  The patient was maintained on a cardiac monitor.  I personally viewed and interpreted the cardiac monitored which showed an underlying rhythm of: sb   Medicines ordered and prescription drug management:   I have reviewed the patients home medicines and have made adjustments as needed   Test  Considered:  ct   Critical Interventions:  ivfs   Consultations Obtained:  I requested consultation with the cardiologist,  and discussed lab and imaging findings as well as pertinent plan - they did see pt in the ED and recommend hospitalist admission. Pt d/w Dr. Lawence Press ([facility]) for admission   Problem List / ED Course:  Syncope:  likely due to bradycardia.  Pt will need a bb washout.  Urine and CT reading pending upon admission   Reevaluation:  After the interventions noted above, I reevaluated the patient and found that they have :improved   Social Determinants of Health:  Lives at home   Dispostion:  After consideration of the diagnostic results and the patients response to treatment, I feel that the patent would benefit from admission.          Final Clinical Impression(s) / ED Diagnoses Final diagnoses:  Syncope, unspecified syncope type    Rx / DC Orders ED Discharge Orders     None         Sueellen Emery, MD 12/17/23 1600

## 2023-12-17 NOTE — ED Notes (Signed)
 Patient transported to CT

## 2023-12-17 NOTE — ED Notes (Signed)
Pt given water w/ EDP permission.  

## 2023-12-17 NOTE — H&P (Signed)
 History and Physical    Regina [name] ZOX:[id number] DOB: [birth year] DOA: 12/17/2023  PCP: Edda Goo, MD  Patient coming from: Home  I have personally briefly reviewed patient's old medical records in [facility]  Chief Complaint: Passed out  HPI: Regina [name] is a 89 y.o. female with medical history significant of Parkinson disease, hypertension, hypothyroidism and CKD stage IIIa was brought into the emergency department due to an episode of passing out.  She does not have much of a cardiac history.  She has an office note from 2016 where she was seen for episode of syncope felt to be vasovagal and related to GI bleed.  Echocardiogram with preserved EF no significant valvular disease.  Put on Coreg  6.25 mg twice daily for blood pressure.  I tried to obtain history from the patient but there was no family member present, and patient still was poor historian, tried calling husband with no answer so history was gathered from ED physician and cardiology notes.  Per husband she went to go to the bathroom to wash out her mouth.  They were talking and he went to go check up on her and she was bent over holding herself up on the door and the sink.  At some point started become lethargic and husband reports that he had to hold her out.  He thinks that she lost consciousness but she reports hearing him the entire time but not able to say anything.  Reports no falls during this episode however frequently falls at home.  Also has frequent reported episodes of syncope when she goes into a hot shower and has multiple skin tears throughout her body and bruising.  They had called EMS and reportedly was bradycardic with heart rates in the 40s.  Systolics in the 40s?  No reports of her losing a pulse.  Husband reports that they had checked her temperature and it was 80.  1 mg of atropine was given and eventually became arousable.  No seizure-like activity. Of note, when I asked patient why she was in the hospital,  she said I passed out.  She could not tell me more about that.  Then she said I am not feeling well because I am " burning with my urine"   ED Course: Upon arrival to ED, patient was slightly hypothermic with temperature of 95.1, bradycardic with heart rate in 50s but normal blood pressure.  Chest x-Sakuma unremarkable.  Creatinine only slightly elevated than her baseline.  Hemoglobin is stable with chronic anemia.  Troponins x 2 negative.  Patient had not received any IV fluids.  Cardiology was consulted and patient was already seen for bradycardia as presumed source of syncope.  Review of Systems: As per HPI otherwise negative.    Past Medical History:  Diagnosis Date   Allergy    Barrett esophagus 08/12/2008   Cancer (HCC)    GERD (gastroesophageal reflux disease)    Hypertension    Thyroid  disease    UTI (urinary tract infection)     Past Surgical History:  Procedure Laterality Date   APPENDECTOMY     COLON SURGERY     COLONOSCOPY     COLONOSCOPY N/A 08/03/2014   Procedure: COLONOSCOPY;  Surgeon: Claudette Cue, MD;  Location: [facility] ENDOSCOPY;  Service: Endoscopy;  Laterality: N/A;   POLYPECTOMY     TONSILLECTOMY     TOTAL THYROIDECTOMY     TUBAL LIGATION       reports that she has never  smoked. She has never used smokeless tobacco. She reports that she does not drink alcohol  and does not use drugs.  Allergies  Allergen Reactions   Tape Other (See Comments)    TAPE PULLS OFF THE SKIN, SO PLEASE USE AN ALTERNATIVE   Penicillins Hives and Itching   Latex Rash and Other (See Comments)    NO Band-Aids!! Pulls off skin     Family History  Problem Relation Age of Onset   Heart disease Mother    Heart disease Father    Diabetes Sister    COPD Brother    Diabetes Brother    Parkinson's disease Neg Hx     Prior to Admission medications   Medication Sig Start Date End Date Taking? Authorizing Provider  amLODipine (NORVASC) 2.5 MG tablet Take 2.5 mg by mouth at bedtime.  12/02/23  Yes [provider]  calcitRIOL  (ROCALTROL ) 0.25 MCG capsule Take 0.25 mcg by mouth daily.   Yes [provider]  carbidopa -levodopa  (SINEMET ) 25-100 MG tablet Take 1 tablet by mouth 3 (three) times daily. 07/02/23  Yes Wess Hammed, NP  carvedilol  (COREG ) 6.25 MG tablet Take 1 tablet (6.25 mg total) by mouth 2 (two) times daily with a meal. 09/30/22  Yes Aram Knights, MD  cephALEXin  (KEFLEX ) 250 MG capsule Take 250 mg by mouth daily. 10/28/23  Yes [provider]  GEMTESA 75 MG TABS Take 1 tablet by mouth daily. 11/05/23  Yes [provider]  SYNTHROID  75 MCG tablet Take 75 mcg by mouth daily before breakfast.   Yes [provider]  SYSTANE ULTRA PF 0.4-0.3 % SOLN Place 1 drop into both eyes in the morning, at noon, and at bedtime.   Yes [provider]  valsartan (DIOVAN) 40 MG tablet Take 40 mg by mouth daily. 10/15/22  Yes [provider]  senna-docusate (SENOKOT-S) 8.6-50 MG tablet Take 1 tablet by mouth daily as needed for mild constipation. Patient not taking: Reported on 12/17/2023 04/12/21   Joette Mustard, MD    Physical Exam: Vitals:   12/17/23 1445 12/17/23 1500 12/17/23 1515 12/17/23 1520  BP: 132/60 (!) 147/51 (!) 145/55   Pulse: (!) 56 (!) 51 (!) 52   Resp: 19 13 14    Temp:    97.7 F (36.5 C)  TempSrc:    Oral  SpO2: 100% 100% 100%   Weight:      Height:        Constitutional: NAD, calm, comfortable Vitals:   12/17/23 1445 12/17/23 1500 12/17/23 1515 12/17/23 1520  BP: 132/60 (!) 147/51 (!) 145/55   Pulse: (!) 56 (!) 51 (!) 52   Resp: 19 13 14    Temp:    97.7 F (36.5 C)  TempSrc:    Oral  SpO2: 100% 100% 100%   Weight:      Height:       Eyes: PERRL, lids and conjunctivae normal ENMT: Mucous membranes are moist. Posterior pharynx clear of any exudate or lesions.Normal dentition.  Neck: normal, supple, no masses, no thyromegaly Respiratory: clear to auscultation bilaterally, no wheezing,  no crackles. Normal respiratory effort. No accessory muscle use.  Cardiovascular: Regular rate and rhythm, no murmurs / rubs / gallops. No extremity edema. 2+ pedal pulses. No carotid bruits.  Abdomen: no tenderness, no masses palpated. No hepatosplenomegaly. Bowel sounds positive.  Musculoskeletal: no clubbing / cyanosis. No joint deformity upper and lower extremities. Good ROM, no contractures. Normal muscle tone.  Skin: no rashes, lesions, ulcers. No induration Neurologic:  CN 2-12 grossly intact. Sensation intact, DTR normal. Strength 5/5 in all 4.  Psychiatric: Normal judgment and insight. Alert and oriented x 3. Normal mood.    Labs on Admission: I have personally reviewed following labs and imaging studies  CBC: Recent Labs  Lab 12/17/23 1147  WBC 8.7  HGB 10.0*  HCT 30.3*  MCV 98.7  PLT 165   Basic Metabolic Panel: Recent Labs  Lab 12/17/23 1147  NA 136  K 5.1  CL 104  CO2 25  GLUCOSE 135*  BUN 26*  CREATININE 1.36*  CALCIUM 8.8*   GFR: Estimated Creatinine Clearance: 27.4 mL/min (A) (by C-G formula based on SCr of 1.36 mg/dL (H)). Liver Function Tests: No results for input(s): "AST", "ALT", "ALKPHOS", "BILITOT", "PROT", "ALBUMIN" in the last 168 hours. No results for input(s): "LIPASE", "AMYLASE" in the last 168 hours. No results for input(s): "AMMONIA" in the last 168 hours. Coagulation Profile: No results for input(s): "INR", "PROTIME" in the last 168 hours. Cardiac Enzymes: No results for input(s): "CKTOTAL", "CKMB", "CKMBINDEX", "TROPONINI" in the last 168 hours. BNP (last 3 results) Recent Labs    12/03/23 1607  PROBNP 774.0*   HbA1C: No results for input(s): "HGBA1C" in the last 72 hours. CBG: No results for input(s): "GLUCAP" in the last 168 hours. Lipid Profile: No results for input(s): "CHOL", "HDL", "LDLCALC", "TRIG", "CHOLHDL", "LDLDIRECT" in the last 72 hours. Thyroid  Function Tests: Recent Labs    12/17/23 1356  TSH 3.577   Anemia  Panel: No results for input(s): "VITAMINB12", "FOLATE", "FERRITIN", "TIBC", "IRON", "RETICCTPCT" in the last 72 hours. Urine analysis:    Component Value Date/Time   COLORURINE YELLOW 12/03/2023 1607   APPEARANCEUR CLEAR 12/03/2023 1607   APPEARANCEUR Clear 04/22/2018 1608   LABSPEC 1.015 12/03/2023 1607   PHURINE 5.5 12/03/2023 1607   GLUCOSEU NEGATIVE 12/03/2023 1607   HGBUR NEGATIVE 12/03/2023 1607   BILIRUBINUR NEGATIVE 12/03/2023 1607   BILIRUBINUR Negative 04/22/2018 1608   KETONESUR NEGATIVE 12/03/2023 1607   PROTEINUR NEGATIVE 12/03/2023 1607   UROBILINOGEN 0.2 07/30/2014 2003   NITRITE NEGATIVE 12/03/2023 1607   LEUKOCYTESUR NEGATIVE 12/03/2023 1607    Radiological Exams on Admission: DG Chest Port 1 View Result Date: 12/17/2023 CLINICAL DATA:  Syncope. EXAM: PORTABLE CHEST 1 VIEW COMPARISON:  April 09, 2021. FINDINGS: The heart size and mediastinal contours are within normal limits. Both lungs are clear. The visualized skeletal structures are unremarkable. IMPRESSION: No active disease. Electronically Signed   By: Rosalene Colon M.D.   On: 12/17/2023 14:16    EKG: Independently reviewed.  Junctional rhythm with right bundle branch block  Assessment/Plan Principal Problem:   Syncope   Syncope, POA: Source remains unclear.  Could be cardiogenic due to bradycardia vs vasovagal.  Cardiology has seen the patient and is following.  Echo is ordered.  Patient on Coreg  which will be held per cardiology recommendations.  Patient will be monitored on telemetry.  Essential hypertension: Blood pressure only slightly elevated at times.  Holding Coreg  and irbesartan but resuming amlodipine.  CKD stage IIIa: Baseline creatinine appears to be around 1.1-1.2, slightly elevated creatinine of 1.36, does not meet criteria for AKI.  Will still avoid nephrotoxic agents and hold irbesartan.  Will hydrate for next 20 hours.  Parkinson's disease: Resume home medications.  Acquired  hypothyroidism: Resume Synthroid .  Check TSH.  Dysuria: UA pending.  DVT prophylaxis: enoxaparin  (LOVENOX ) injection 40 mg Start: 12/17/23 1600 Code Status: Full code by default.  Called husband, the daughter picked  up the phone and she had no answer about the CODE STATUS.  She told me to call the son as the father was with the son.  Called son's phone number with no answer.  Will discuss with family once able to get a hold of them. Family Communication: None present at bedside.  Plan of care discussed with patient in length and he verbalized understanding and agreed with it. Disposition Plan: Potential discharge tomorrow. Consults called: Cardiology  Modena Andes MD [facility]  *Please note that this is a verbal dictation therefore any spelling or grammatical errors are due to the "Dragon Medical One" system interpretation.  Please page via Amion and do not message via secure chat for urgent patient care matters. Secure chat can be used for non urgent patient care matters. 12/17/2023, 3:54 PM  To contact the attending provider between 7A-7P or the covering provider during after hours 7P-7A, please log into the web site [email or web address]

## 2023-12-17 NOTE — Consult Note (Addendum)
 Cardiology Consultation   Patient ID: Regina [name] MRN: [id number]; DOB: 04/30/1933  Admit date: 12/17/2023 Date of Consult: 12/17/2023  PCP:  Edda Goo, MD   [facility] Providers Cardiologist:  None   {  Patient Profile:   Regina [name] is a 89 y.o. female with a hx of Parkinson's, hypothyroidism with thyroidectomy, hypertension who is being seen 12/17/2023 for the evaluation of syncope at the request of ED.  History of Present Illness:   Ms. Todhunter d had a hx of syncope in 2016, felt vasovagal related to GI bleed   Echo showed LVEF normal   Pt put on carvedilol  for BP  Talking to son, pt had a syncopal spell about 1 year ago   Was walking outside   Got weak and passed out   No injury    This morning she woke up   Had breakfast      Husband reports that she went to go to the bathroom to wash out her mouth.  They were talking and he went to go check up on her and she was bent over holding herself up on the door and the sink.  At some point started become lethargic and husband reports that he had to hold her out.  He thinks that she lost consciousness but she reports hearing him the entire time but not able to say anything.  Reports no falls during this episode however frequently falls at home.  \  Husband called EMS Pt  reportedly was bradycardic with heart rates in the 40s.  Systolics in the 40s?  No reports of her losing a pulse.  Husband reports that they had checked her temperature and it was 80.  1 mg of atropine was given and eventually became arousable.  Patient is a poor historian but reports she did have some dizziness during this episode, not able to provide much detail though.  No chest pain or shortness of breath no peripheral edema.  Reported some palpitations occasionally.  No seizure-like activity.  Heart rates are in the mid 50s now.  Negative chest x-Mower.  Potassium 5.1.  Creatinine 1.36.  Calcium 8.8.  Troponin 7.  Hemoglobin 10.   PT reports frequent repisodes  of syncope when she goes into a hot shower and has multiple skin tears throughout her body and bruising  Past Medical History:  Diagnosis Date   Allergy    Barrett esophagus 08/12/2008   Cancer (HCC)    GERD (gastroesophageal reflux disease)    Hypertension    Thyroid  disease    UTI (urinary tract infection)     Past Surgical History:  Procedure Laterality Date   APPENDECTOMY     COLON SURGERY     COLONOSCOPY     COLONOSCOPY N/A 08/03/2014   Procedure: COLONOSCOPY;  Surgeon: Claudette Cue, MD;  Location: [facility] ENDOSCOPY;  Service: Endoscopy;  Laterality: N/A;   POLYPECTOMY     TONSILLECTOMY     TOTAL THYROIDECTOMY     TUBAL LIGATION       Inpatient Medications: Scheduled Meds:  Continuous Infusions:  PRN Meds:   Allergies:    Allergies  Allergen Reactions   Tape Other (See Comments)    TAPE PULLS OFF THE SKIN, SO PLEASE USE AN ALTERNATIVE   Penicillins Hives and Itching   Latex Rash and Other (See Comments)    NO Band-Aids!! Pulls off skin     Social History:   Social History   Socioeconomic History  Marital status: Married    Spouse name: Royston Cornea   Number of children: Not on file   Years of education: Not on file   Highest education level: Not on file  Occupational History   Not on file  Tobacco Use   Smoking status: Never   Smokeless tobacco: Never  Substance and Sexual Activity   Alcohol  use: No   Drug use: No   Sexual activity: Not Currently  Other Topics Concern   Not on file  Social History Narrative   Lives with husband and son   Right Handed   Drinks 1-2 cups caffeine daily   Social Drivers of Corporate investment banker Strain: Not on file  Food Insecurity: Not on file  Transportation Needs: Not on file  Physical Activity: Not on file  Stress: Not on file  Social Connections: Not on file  Intimate Partner Violence: Not on file    Family History:   Family History  Problem Relation Age of Onset   Heart disease Mother    Heart  disease Father    Diabetes Sister    COPD Brother    Diabetes Brother    Parkinson's disease Neg Hx      ROS:  Please see the history of present illness.  All other ROS reviewed and negative.     Physical Exam/Data:   Vitals:   12/17/23 1345 12/17/23 1400 12/17/23 1415 12/17/23 1430  BP: (!) 161/55 (!) 145/57 (!) 142/52 (!) 143/54  Pulse: (!) 50 (!) 54 (!) 51 (!) 52  Resp: 15 (!) 21 16 13   Temp:      TempSrc:      SpO2: 100% 100% 100% 100%  Weight:      Height:       No intake or output data in the 24 hours ending 12/17/23 1434    12/17/2023   11:27 AM 12/03/2023    1:50 PM 07/02/2023   11:03 AM  Last 3 Weights  Weight (lbs) 134 lb 134 lb 11.2 oz 134 lb 12.8 oz  Weight (kg) 60.782 kg 61.1 kg 61.145 kg     Body mass index is 20.99 kg/m.  General:  Well nourished, well developed, in no acute distress HEENT: normal Neck: no JVD Vascular: No carotid bruits; Distal pulses 2+ bilaterally Cardiac:  normal S1, S2; RRR; no murmur Lungs:  clear to auscultation bilaterally, no wheezing, rhonchi or rales  Abd: soft, nontender, no hepatomegaly  Ext: no edema Musculoskeletal:  No deformities, BUE and BLE strength normal and equal Skin: warm and dry  Neuro:  CNs 2-12 intact, no focal abnormalities noted Psych:  Normal affect   EKG:  The EKG was personally reviewed and demonstrates: Artifact noted throughout.  However looks like sinus rhythm heart rate 57.  Right bundle branch block.  No acute ST-T wave changes. QTc 511 Telemetry:  Telemetry was personally reviewed and demonstrates: Sinus rhythm heart rates 55-60s.  Relevant CV Studies: Echo ordered   Laboratory Data:  High Sensitivity Troponin:   Recent Labs  Lab 12/17/23 1147  TROPONINIHS 7     Chemistry Recent Labs  Lab 12/17/23 1147  NA 136  K 5.1  CL 104  CO2 25  GLUCOSE 135*  BUN 26*  CREATININE 1.36*  CALCIUM 8.8*  GFRNONAA 37*  ANIONGAP 7    No results for input(s): "PROT", "ALBUMIN", "AST", "ALT",  "ALKPHOS", "BILITOT" in the last 168 hours. Lipids No results for input(s): "CHOL", "TRIG", "HDL", "LABVLDL", "LDLCALC", "CHOLHDL" in the  last 168 hours.  Hematology Recent Labs  Lab 12/17/23 1147  WBC 8.7  RBC 3.07*  HGB 10.0*  HCT 30.3*  MCV 98.7  MCH 32.6  MCHC 33.0  RDW 13.2  PLT 165   Thyroid  No results for input(s): "TSH", "FREET4" in the last 168 hours.  BNPNo results for input(s): "BNP", "PROBNP" in the last 168 hours.  DDimer No results for input(s): "DDIMER" in the last 168 hours.   Radiology/Studies:  DG Chest Port 1 View Result Date: 12/17/2023 CLINICAL DATA:  Syncope. EXAM: PORTABLE CHEST 1 VIEW COMPARISON:  April 09, 2021. FINDINGS: The heart size and mediastinal contours are within normal limits. Both lungs are clear. The visualized skeletal structures are unremarkable. IMPRESSION: No active disease. Electronically Signed   By: Rosalene Colon M.D.   On: 12/17/2023 14:16     Assessment and Plan:   Syncope History of vasovagal syncope/frequent falls Reportedly patient went to go to the bathroom and started becoming lethargic and had unresponsive episode 10 to 15 minutes.  Patient reports no LOC.  Reportedly bradycardic in the 40s, however heart rates here have been 55-60. Pt had a similar episode last year   They report frequent episodes of her passing out when trying to take a hot shower. Trop negative    EKG with SB RBBB  QTc 511.  REcomm Would /DC her carvedilol  6.25 mg.   Continue telemetry to evaluate for bradycaerdia   Repeat EKG in AM Echo to reevaluate LV /RV function Get orthostatics in am    Hx of Parkinsons   This along with age could contribute to autonomic instability   Check UA for SG Pt will need monitor as outpt inf nothing found here   Hypothermia? Son and husband report that she had temperature 80 degrees.  Here she is 95.1.   Then 97.7    Hypothyroidism status post thyroidectomy TSH is pending.    Risk Assessment/Risk Scores:     For questions or updates, please contact [facility] Please consult [email or web address] for contact info under    Signed, Burnetta Cart, PA-C  12/17/2023 2:34 PM   Pt seen and examined   I have amended note above by Shawna Dell to reflect my findings  Pt is an 89 yo with hx of HTN, Parkinsons and also syncope in past    Presents to ER with near syncopal spell this am   EMS found her bradycardic and hypotensive Atropine given     Here BP has been OK   She has been in SB       She says she hurts all over   Thin, frail 89 yo in NAD NEck   JVP is normal  no bruits Lungs are CTA  Cardiac RRR  No S3  no murmurs  Abd   Supple   No hepatomegaly Ext are without edema   TSH pending   REcommendations as noted above Keep on Telemetry   Stop b blocker    Would increase amlodipine to 5 mg  Echo in Am UA for SG    Ola Berger MD

## 2023-12-18 ENCOUNTER — Observation Stay (HOSPITAL_COMMUNITY)

## 2023-12-18 DIAGNOSIS — R42 Dizziness and giddiness: Secondary | ICD-10-CM | POA: Diagnosis not present

## 2023-12-18 DIAGNOSIS — R55 Syncope and collapse: Secondary | ICD-10-CM

## 2023-12-18 DIAGNOSIS — R001 Bradycardia, unspecified: Secondary | ICD-10-CM | POA: Diagnosis not present

## 2023-12-18 DIAGNOSIS — Z7189 Other specified counseling: Secondary | ICD-10-CM | POA: Diagnosis not present

## 2023-12-18 DIAGNOSIS — N1831 Chronic kidney disease, stage 3a: Secondary | ICD-10-CM | POA: Diagnosis not present

## 2023-12-18 DIAGNOSIS — I129 Hypertensive chronic kidney disease with stage 1 through stage 4 chronic kidney disease, or unspecified chronic kidney disease: Secondary | ICD-10-CM | POA: Diagnosis not present

## 2023-12-18 LAB — BASIC METABOLIC PANEL WITH GFR
Anion gap: 8 (ref 5–15)
BUN: 22 mg/dL (ref 8–23)
CO2: 22 mmol/L (ref 22–32)
Calcium: 8.7 mg/dL — ABNORMAL LOW (ref 8.9–10.3)
Chloride: 108 mmol/L (ref 98–111)
Creatinine, Ser: 1.25 mg/dL — ABNORMAL HIGH (ref 0.44–1.00)
GFR, Estimated: 41 mL/min — ABNORMAL LOW (ref >60)
Glucose, Bld: 80 mg/dL (ref 70–99)
Potassium: 3.7 mmol/L (ref 3.5–5.1)
Sodium: 138 mmol/L (ref 135–145)

## 2023-12-18 LAB — CBC
HCT: 29.9 % — ABNORMAL LOW (ref 36.0–46.0)
Hemoglobin: 10.2 g/dL — ABNORMAL LOW (ref 12.0–15.0)
MCH: 33.4 pg (ref 26.0–34.0)
MCHC: 34.1 g/dL (ref 30.0–36.0)
MCV: 98 fL (ref 80.0–100.0)
Platelets: 167 10*3/uL (ref 150–400)
RBC: 3.05 MIL/uL — ABNORMAL LOW (ref 3.87–5.11)
RDW: 13.2 % (ref 11.5–15.5)
WBC: 9.1 10*3/uL (ref 4.0–10.5)
nRBC: 0 % (ref 0.0–0.2)

## 2023-12-18 LAB — ECHOCARDIOGRAM COMPLETE
AR max vel: 2.03 cm2
AV Area VTI: 2.04 cm2
AV Area mean vel: 2.04 cm2
AV Mean grad: 5 mmHg
AV Peak grad: 8.3 mmHg
Ao pk vel: 1.44 m/s
Area-P 1/2: 2.26 cm2
Height: 67 in
S' Lateral: 2.7 cm
Weight: 2144 [oz_av]

## 2023-12-18 MED ORDER — HYDRALAZINE HCL 20 MG/ML IJ SOLN: 10.0000 mg | Freq: Four times a day (QID) | INTRAMUSCULAR | Status: DC | PRN

## 2023-12-18 NOTE — TOC Initial Note (Addendum)
 Transition of Care (TOC) - Initial/Assessment Note   Spoke to patient and husband at bedside . Regina [name] wants patient to go to SNF at discharge.   Explained Medicare Observation to [name]. Regina [name] asked for MD to change to inpatient status . NCM explained again patient would have to meet medicare's criteria for inpatient status , not just having an inpatient order. Regina [name] voiced understanding.   Patient already has home health with [facility].   NCM called Regina [name] with [facility] she is checking to see if patient is active and what services she has. Await call back  [facility] sent message patient is active for RN,OT,PT . NCM asked if they can add aide and sw . They can add aide and sw. Entered orders and asked MD to sign  Patient Details  Name: Regina [name] MRN: [id number] Date of Birth: October 10, 1932  Transition of Care [facility]) CM/SW Contact:    Regina Ferri, RN Phone Number: 12/18/2023, 10:33 AM  Clinical Narrative:                   Expected Discharge Plan: Home w Home Health Services Barriers to Discharge: Continued Medical Work up   Patient Goals and CMS Choice   CMS Medicare.gov Compare Post Acute Care list provided to:: Patient Choice offered to / list presented to : Patient      Expected Discharge Plan and Services   Discharge Planning Services: CM Consult Post Acute Care Choice: Home Health Living arrangements for the past 2 months: Skilled Nursing Facility                             [facility] Agency: [facility] Date So [facility] Agency Contacted: 12/18/23 Time HH Agency Contacted: 1033 Representative spoke with at [facility] Agency: Regina [name]  Prior Living Arrangements/Services Living arrangements for the past 2 months: Skilled Nursing Facility Lives with:: Spouse Patient language and need for interpreter reviewed:: Yes        Need for Family Participation in Patient Care: Yes (Comment) Care giver support system in place?: Yes (comment) Current home services: DME Criminal  Activity/Legal Involvement Pertinent to Current Situation/Hospitalization: No - Comment as needed  Activities of Daily Living   ADL Screening (condition at time of admission) Independently performs ADLs?: No Does the patient have a NEW difficulty with bathing/dressing/toileting/self-feeding that is expected to last >3 days?: No Does the patient have a NEW difficulty with getting in/out of bed, walking, or climbing stairs that is expected to last >3 days?: No Does the patient have a NEW difficulty with communication that is expected to last >3 days?: No Is the patient deaf or have difficulty hearing?: No Does the patient have difficulty seeing, even when wearing glasses/contacts?: No Does the patient have difficulty concentrating, remembering, or making decisions?: Yes  Permission Sought/Granted   Permission granted to share information with : Yes, Verbal Permission Granted  Share Information with NAME: husband Regina [name]  Permission granted to share info w AGENCY: [facility]        Emotional Assessment Appearance:: Appears stated age       Alcohol  / Substance Use: Not Applicable Psych Involvement: No (comment)  Admission diagnosis:  Syncope [R55] Syncope, unspecified syncope type [R55] Patient Active Problem List   Diagnosis Date Noted   Dizziness 12/17/2023   Acute colitis 09/27/2022   Parkinsonism (HCC) 02/14/2022   UTI (urinary tract infection) 04/09/2021   Gait disorder 12/28/2020   Malnutrition of moderate degree 03/10/2018  Acute on chronic diastolic CHF (congestive heart failure) (HCC)    Hip pain    Acute cystitis without hematuria    Delirium    Memory loss    Ataxia 03/09/2018   Weakness generalized 03/09/2018   Acute renal failure superimposed on chronic kidney disease (HCC) 03/09/2018   Generalized weakness 03/09/2018   Internal hemorrhoids with complication 08/03/2014   Ulcer of lower limb (HCC) 08/02/2014   Chronic diastolic heart failure (HCC) 08/01/2014    Right bundle branch block 08/01/2014   Essential hypertension 08/01/2014   Hypothyroidism 08/01/2014   Rectal bleeding 07/31/2014   Hypertension 07/31/2014   GERD (gastroesophageal reflux disease) 07/31/2014   Other specified anemias 07/31/2014   Lower GI bleeding    Faintness    Syncope 07/30/2014   Varicose veins of bilateral lower extremities with other complications 07/05/2013   PCP:  Regina Goo, MD Pharmacy:   [id number] [address] ([address]), [place] - [address] [place] ([place]) [place] [address] Phone: [phone] Fax: [phone]     Social Drivers of Health (SDOH) Social History: SDOH Screenings   Food Insecurity: No Food Insecurity (12/17/2023)  Housing: Low Risk  (12/17/2023)  Transportation Needs: No Transportation Needs (12/17/2023)  Utilities: Not At Risk (12/17/2023)  Social Connections: Socially Integrated (12/17/2023)  Tobacco Use: Low Risk  (12/17/2023)   SDOH Interventions:     Readmission Risk Interventions     No data to display

## 2023-12-18 NOTE — Progress Notes (Addendum)
 PROGRESS NOTE    Regina [name]  ZOX:[id number] DOB: 07-Mar-1933 DOA: 12/17/2023 PCP: Edda Goo, MD   Brief Narrative:  HPI: Regina [name] is a 89 y.o. female with medical history significant of Parkinson disease, hypertension, hypothyroidism and CKD stage IIIa was brought into the emergency department due to an episode of passing out.  She does not have much of a cardiac history.  She has an office note from 2016 where she was seen for episode of syncope felt to be vasovagal and related to GI bleed.  Echocardiogram with preserved EF no significant valvular disease.  Put on Coreg  6.25 mg twice daily for blood pressure.  I tried to obtain history from the patient but there was no family member present, and patient still was poor historian, tried calling husband with no answer so history was gathered from ED physician and cardiology notes.  Per husband she went to go to the bathroom to wash out her mouth.  They were talking and he went to go check up on her and she was bent over holding herself up on the door and the sink.  At some point started become lethargic and husband reports that he had to hold her out.  He thinks that she lost consciousness but she reports hearing him the entire time but not able to say anything.  Reports no falls during this episode however frequently falls at home.  Also has frequent reported episodes of syncope when she goes into a hot shower and has multiple skin tears throughout her body and bruising.  They had called EMS and reportedly was bradycardic with heart rates in the 40s.  Systolics in the 40s?  No reports of her losing a pulse.  Husband reports that they had checked her temperature and it was 80.  1 mg of atropine was given and eventually became arousable.  No seizure-like activity. Of note, when I asked patient why she was in the hospital, she said I passed out.  She could not tell me more about that.  Then she said I am not feeling well because I am " burning with my  urine"    ED Course: Upon arrival to ED, patient was slightly hypothermic with temperature of 95.1, bradycardic with heart rate in 50s but normal blood pressure.  Chest x-Bansal unremarkable.  Creatinine only slightly elevated than her baseline.  Hemoglobin is stable with chronic anemia.  Troponins x 2 negative.  Patient had not received any IV fluids.  Cardiology was consulted and patient was already seen for bradycardia as presumed source of syncope.  Assessment & Plan:   Principal Problem:   Syncope Active Problems:   Dizziness  Syncope/sinus bradycardia, POA: Source multifactorial.  Could be cardiogenic due to bradycardia vs vasovagal or orthostatic.  Cardiology has seen the patient and is following.  Echo is pending.  Patient on Coreg  which is being held per cardiology recommendations.  Bradycardia has improved.  Patient is not dizzy anymore.  However she was orthostatic positive this morning.  Will repeat orthostatic vitals and hydrate if needed but clinically she does not appear to be too dehydrated.  Also, patient with having Parkinson's disease is at risk of possible autonomic dysfunction.  Cardiology plans Zio patch at discharge.   Essential hypertension: Blood pressure only slightly elevated at times.  Holding Coreg  and irbesartan but continue amlodipine.   CKD stage IIIa: Baseline creatinine appears to be around 1.1-1.2, slightly elevated creatinine of 1.36 which improved today, does not meet  criteria for AKI.  Will still avoid nephrotoxic agents and hold irbesartan.  Will hydrate for next 20 hours.   Parkinson's disease: Continue home medications.   Acquired hypothyroidism: Resume Synthroid .  Check TSH.   Dysuria: UA ordered from yesterday is still pending.  Generalized weakness: PT OT recommended SNF.  Husband is willing to pay out-of-pocket if needed.  TOC has been made aware.  DVT prophylaxis: enoxaparin  (LOVENOX ) injection 30 mg Start: 12/17/23 1600   Code Status: Limited: Do  not attempt resuscitation (DNR) -DNR-LIMITED -Do Not Intubate/DNI   Family Communication: Husband present at bedside.  Plan of care discussed with patient and family in length and he/she verbalized understanding and agreed with it.  Status is: Observation The patient will require care spanning > 2 midnights and should be moved to inpatient because: Patient seen by PT OT, SNF is recommended.  Patient medically stable for discharge when SNF arranged.   Estimated body mass index is 20.99 kg/m as calculated from the following:   Height as of this encounter: 5\' 7"  (1.702 m).   Weight as of this encounter: 60.8 kg.    Nutritional Assessment: Body mass index is 20.99 kg/m.Aaron Aas Seen by dietician.  I agree with the assessment and plan as outlined below: Nutrition Status:        . Skin Assessment: I have examined the patient's skin and I agree with the wound assessment as performed by the wound care RN as outlined below:    Consultants:  Cardiology  Procedures:  None  Antimicrobials:  Anti-infectives (From admission, onward)    None         Subjective: Patient seen and examined.  She has no complaints.  Discussed CODE STATUS with the patient and he told me that they both have living will and they both are DNR.  CODE STATUS changed to DNR.  Objective: Vitals:   12/18/23 0124 12/18/23 0422 12/18/23 0900 12/18/23 0940  BP: (!) 153/50 (!) 151/47 (!) 151/47 (!) 154/78  Pulse: 63 64  (!) 59  Resp:  16    Temp: 98.8 F (37.1 C) 98.6 F (37 C) 98.6 F (37 C)   TempSrc:   Oral   SpO2: 96% 97%    Weight:      Height:        Intake/Output Summary (Last 24 hours) at 12/18/2023 1258 Last data filed at 12/18/2023 0440 Gross per 24 hour  Intake 1496.98 ml  Output 450 ml  Net 1046.98 ml   Filed Weights   12/17/23 1127  Weight: 60.8 kg    Examination:  General exam: Appears calm and comfortable  Respiratory system: Clear to auscultation. Respiratory effort  normal. Cardiovascular system: S1 & S2 heard, RRR. No JVD, murmurs, rubs, gallops or clicks. No pedal edema. Gastrointestinal system: Abdomen is nondistended, soft and nontender. No organomegaly or masses felt. Normal bowel sounds heard. Central nervous system: Alert and oriented. No focal neurological deficits. Extremities: Symmetric 5 x 5 power. Skin: No rashes, lesions or ulcers Psychiatry: Judgement and insight appear normal. Mood & affect appropriate.    Data Reviewed: I have personally reviewed following labs and imaging studies  CBC: Recent Labs  Lab 12/17/23 1147 12/18/23 0623  WBC 8.7 9.1  HGB 10.0* 10.2*  HCT 30.3* 29.9*  MCV 98.7 98.0  PLT 165 167   Basic Metabolic Panel: Recent Labs  Lab 12/17/23 1147 12/18/23 0623  NA 136 138  K 5.1 3.7  CL 104 108  CO2 25 22  GLUCOSE  135* 80  BUN 26* 22  CREATININE 1.36* 1.25*  CALCIUM 8.8* 8.7*   GFR: Estimated Creatinine Clearance: 29.9 mL/min (A) (by C-G formula based on SCr of 1.25 mg/dL (H)). Liver Function Tests: No results for input(s): "AST", "ALT", "ALKPHOS", "BILITOT", "PROT", "ALBUMIN" in the last 168 hours. No results for input(s): "LIPASE", "AMYLASE" in the last 168 hours. No results for input(s): "AMMONIA" in the last 168 hours. Coagulation Profile: No results for input(s): "INR", "PROTIME" in the last 168 hours. Cardiac Enzymes: No results for input(s): "CKTOTAL", "CKMB", "CKMBINDEX", "TROPONINI" in the last 168 hours. BNP (last 3 results) Recent Labs    12/03/23 1607  PROBNP 774.0*   HbA1C: No results for input(s): "HGBA1C" in the last 72 hours. CBG: No results for input(s): "GLUCAP" in the last 168 hours. Lipid Profile: No results for input(s): "CHOL", "HDL", "LDLCALC", "TRIG", "CHOLHDL", "LDLDIRECT" in the last 72 hours. Thyroid  Function Tests: Recent Labs    12/17/23 1356  TSH 3.577   Anemia Panel: No results for input(s): "VITAMINB12", "FOLATE", "FERRITIN", "TIBC", "IRON", "RETICCTPCT"  in the last 72 hours. Sepsis Labs: No results for input(s): "PROCALCITON", "LATICACIDVEN" in the last 168 hours.  No results found for this or any previous visit (from the past 240 hours).   Radiology Studies: CT Head Wo Contrast Result Date: 12/17/2023 CLINICAL DATA:  Altered mental status. EXAM: CT HEAD WITHOUT CONTRAST TECHNIQUE: Contiguous axial images were obtained from the base of the skull through the vertex without intravenous contrast. RADIATION DOSE REDUCTION: This exam was performed according to the departmental dose-optimization program which includes automated exposure control, adjustment of the mA and/or kV according to patient size and/or use of iterative reconstruction technique. COMPARISON:  March 24, 2022 FINDINGS: Brain: There is generalized cerebral atrophy with widening of the extra-axial spaces and ventricular dilatation. There are areas of decreased attenuation within the white matter tracts of the supratentorial brain, consistent with microvascular disease changes. Vascular: Moderate severity bilateral cavernous carotid artery calcification is noted. Skull: Normal. Negative for fracture or focal lesion. Sinuses/Orbits: A 9 mm right maxillary sinus polyp versus mucous retention cyst is noted. Other: A stable partially calcified scalp soft tissue nodules are seen. IMPRESSION: 1. Generalized cerebral atrophy with chronic white matter small vessel ischemic changes. 2. No acute intracranial abnormality. Electronically Signed   By: Virgle Grime M.D.   On: 12/17/2023 16:21   DG Chest Port 1 View Result Date: 12/17/2023 CLINICAL DATA:  Syncope. EXAM: PORTABLE CHEST 1 VIEW COMPARISON:  April 09, 2021. FINDINGS: The heart size and mediastinal contours are within normal limits. Both lungs are clear. The visualized skeletal structures are unremarkable. IMPRESSION: No active disease. Electronically Signed   By: Rosalene Colon M.D.   On: 12/17/2023 14:16    Scheduled Meds:  amLODipine   5 mg Oral QHS   carbidopa -levodopa   1 tablet Oral TID   enoxaparin  (LOVENOX ) injection  30 mg Subcutaneous Q24H   levothyroxine   75 mcg Oral QAC breakfast   sodium chloride  flush  3 mL Intravenous Q12H   Continuous Infusions:   LOS: 0 days   Modena Andes, MD [facility]  12/18/2023, 12:58 PM   *Please note that this is a verbal dictation therefore any spelling or grammatical errors are due to the "Dragon Medical One" system interpretation.  Please page via Amion and do not message via secure chat for urgent patient care matters. Secure chat can be used for non urgent patient care matters.  How to contact the [facility] Attending  or Consulting provider 7A - 7P or covering provider during after hours 7P -7A, for this patient?  Check the care team in [facility] and look for a) attending/consulting TR[facility] provider listed and b) the TR[facility] team listed. Page or secure chat 7A-7P. Log into [email or web address] and use [place]'s universal password to access. If you do not have the password, please contact the hospital operator. Locate the [facility] provider you are looking for under [facility] and page to a number that you can be directly reached. If you still have difficulty reaching the provider, please page the [facility] (Director on Call) for the Hospitalists listed on amion for assistance.

## 2023-12-18 NOTE — NC FL2 (Signed)
 [place]  MEDICAID FL2 LEVEL OF CARE FORM     IDENTIFICATION  Patient Name: Regina [name] Birthdate: 24-Oct-1932 Sex: female Admission Date (Current Location): 12/17/2023  [facility] and [place] Number:  Producer, television/film/video and Address:  [facility], [address], [place], [place] [address]      Provider Number: [id number]  Attending Physician Name and Address:  Modena Andes, MD  Relative Name and Phone Number:  Samah, Didion Spouse [phone]  (C)[phone]    Current Level of Care: Hospital Recommended Level of Care: Skilled Nursing Facility Prior Approval Number:    Date Approved/Denied:   PASRR Number: [id number]  Discharge Plan: SNF    Current Diagnoses: Patient Active Problem List   Diagnosis Date Noted   Dizziness 12/17/2023   Acute colitis 09/27/2022   Parkinsonism (HCC) 02/14/2022   UTI (urinary tract infection) 04/09/2021   Gait disorder 12/28/2020   Malnutrition of moderate degree 03/10/2018   Acute on chronic diastolic CHF (congestive heart failure) (HCC)    Hip pain    Acute cystitis without hematuria    Delirium    Memory loss    Ataxia 03/09/2018   Weakness generalized 03/09/2018   Acute renal failure superimposed on chronic kidney disease (HCC) 03/09/2018   Generalized weakness 03/09/2018   Internal hemorrhoids with complication 08/03/2014   Ulcer of lower limb (HCC) 08/02/2014   Chronic diastolic heart failure (HCC) 08/01/2014   Right bundle branch block 08/01/2014   Essential hypertension 08/01/2014   Hypothyroidism 08/01/2014   Rectal bleeding 07/31/2014   Hypertension 07/31/2014   GERD (gastroesophageal reflux disease) 07/31/2014   Other specified anemias 07/31/2014   Lower GI bleeding    Faintness    Syncope 07/30/2014   Varicose veins of bilateral lower extremities with other complications 07/05/2013    Orientation RESPIRATION BLADDER Height & Weight     Self, Place, Situation  Normal Incontinent  Weight: 134 lb (60.8 kg) Height:  5\' 7"  (170.2 cm)  BEHAVIORAL SYMPTOMS/MOOD NEUROLOGICAL BOWEL NUTRITION STATUS      Continent Diet (See dc summary)  AMBULATORY STATUS COMMUNICATION OF NEEDS Skin   Extensive Assist Verbally PU Stage and Appropriate Care (Wound / Incision (Open or Dehisced) 12/17/23 Elbow Left;Posterior;Wound / Incision (Open or Dehisced) 08/02/14 Other (Comment) Leg Left;Wound / Incision (Open or Dehisced) 08/02/14 Laceration Leg Right)                       Personal Care Assistance Level of Assistance  Bathing, Feeding, Dressing Bathing Assistance: Maximum assistance Feeding assistance: Maximum assistance Dressing Assistance: Maximum assistance     Functional Limitations Info  Sight, Hearing, Speech Sight Info: Adequate Hearing Info: Adequate Speech Info: Adequate    SPECIAL CARE FACTORS FREQUENCY  PT (By licensed PT), OT (By licensed OT)     PT Frequency: 5x weekly OT Frequency: 5x weekly            Contractures      Additional Factors Info  Code Status, Allergies Code Status Info: DNR-Limited Allergies Info: Tape;Penicillins;Latex           Current Medications (12/18/2023):  This is the current hospital active medication list Current Facility-Administered Medications  Medication Dose Route Frequency Provider Last Rate Last Admin   acetaminophen  (TYLENOL ) tablet 650 mg  650 mg Oral Q6H PRN Pahwani, Ravi, MD   650 mg at 12/18/23 1227   Or   acetaminophen  (TYLENOL ) suppository 650 mg  650 mg Rectal Q6H PRN  Pahwani, Ravi, MD       amLODipine (NORVASC) tablet 5 mg  5 mg Oral QHS Ross, Paula V, MD   5 mg at 12/17/23 2029   carbidopa -levodopa  (SINEMET  IR) 25-100 MG per tablet immediate release 1 tablet  1 tablet Oral TID Modena Andes, MD   1 tablet at 12/18/23 9147   enoxaparin  (LOVENOX ) injection 30 mg  30 mg Subcutaneous Q24H Pahwani, Ravi, MD   30 mg at 12/17/23 1827   hydrALAZINE  (APRESOLINE ) injection 10 mg  10 mg Intravenous Q6H PRN Modena Andes, MD       levothyroxine  (SYNTHROID ) tablet 75 mcg  75 mcg Oral QAC breakfast Modena Andes, MD   75 mcg at 12/18/23 0549   ondansetron  (ZOFRAN ) tablet 4 mg  4 mg Oral Q6H PRN Modena Andes, MD       Or   ondansetron  (ZOFRAN ) injection 4 mg  4 mg Intravenous Q6H PRN Pahwani, Ravi, MD       sodium chloride  flush (NS) 0.9 % injection 3 mL  3 mL Intravenous Q12H Modena Andes, MD         Discharge Medications: Please see discharge summary for a list of discharge medications.  Relevant Imaging Results:  Relevant Lab Results:   Additional Information SS#: [id number]  Murphy Arn, LCSWA

## 2023-12-18 NOTE — Progress Notes (Addendum)
 Rounding Note    Patient Name: Regina [name] Date of Encounter: 12/18/2023  [facility] Cardiologist: None   Subjective   Pt sitting in chair   Denies dizziness   No CP   Inpatient Medications    Scheduled Meds:  amLODipine  5 mg Oral QHS   carbidopa -levodopa   1 tablet Oral TID   enoxaparin  (LOVENOX ) injection  30 mg Subcutaneous Q24H   levothyroxine   75 mcg Oral QAC breakfast   sodium chloride  flush  3 mL Intravenous Q12H   Continuous Infusions:  sodium chloride  100 mL/hr at 12/18/23 0440   PRN Meds: acetaminophen  **OR** acetaminophen , ondansetron  **OR** ondansetron  (ZOFRAN ) IV   Vital Signs    Vitals:   12/17/23 2010 12/17/23 2029 12/18/23 0124 12/18/23 0422  BP: (!) 120/106 (!) 148/91 (!) 153/50 (!) 151/47  Pulse: (!) 57  63 64  Resp: 16   16  Temp: 97.6 F (36.4 C)  98.8 F (37.1 C) 98.6 F (37 C)  TempSrc: Oral     SpO2: 99%  96% 97%  Weight:      Height:        Orthostatics:   BP laying 154/70  P 62  Sitting  144/50  P 61  Standing 130/93  P 64     Intake/Output Summary (Last 24 hours) at 12/18/2023 0751 Last data filed at 12/18/2023 0440 Gross per 24 hour  Intake 1976.98 ml  Output 450 ml  Net 1526.98 ml      12/17/2023   11:27 AM 12/03/2023    1:50 PM 07/02/2023   11:03 AM  Last 3 Weights  Weight (lbs) 134 lb 134 lb 11.2 oz 134 lb 12.8 oz  Weight (kg) 60.782 kg 61.1 kg 61.145 kg      Telemetry    SR  - Personally Reviewed  ECG    NSR 72 bpm   RBBB  Later MI  (question lead placement , different from yesterday    QTc 490  - Personally Reviewed  Physical Exam   GEN: Frail appearing 89 yo in NAD Neck: No JVD Cardiac: RRR, no murmur Respiratory: Clear to auscultation GI: Soft, nontender, non-distended  MS: NoLE  edema;  Multiple bandages on arms and legs (bruises from falls0   Labs    High Sensitivity Troponin:   Recent Labs  Lab 12/17/23 1147 12/17/23 1356  TROPONINIHS 7 8     Chemistry Recent Labs  Lab  12/17/23 1147  NA 136  K 5.1  CL 104  CO2 25  GLUCOSE 135*  BUN 26*  CREATININE 1.36*  CALCIUM 8.8*  GFRNONAA 37*  ANIONGAP 7    Lipids No results for input(s): "CHOL", "TRIG", "HDL", "LABVLDL", "LDLCALC", "CHOLHDL" in the last 168 hours.  Hematology Recent Labs  Lab 12/17/23 1147 12/18/23 0623  WBC 8.7 9.1  RBC 3.07* 3.05*  HGB 10.0* 10.2*  HCT 30.3* 29.9*  MCV 98.7 98.0  MCH 32.6 33.4  MCHC 33.0 34.1  RDW 13.2 13.2  PLT 165 167   Thyroid   Recent Labs  Lab 12/17/23 1356  TSH 3.577    BNPNo results for input(s): "BNP", "PROBNP" in the last 168 hours.  DDimer No results for input(s): "DDIMER" in the last 168 hours.   Radiology    CT Head Wo Contrast Result Date: 12/17/2023 CLINICAL DATA:  Altered mental status. EXAM: CT HEAD WITHOUT CONTRAST TECHNIQUE: Contiguous axial images were obtained from the base of the skull through the vertex without intravenous contrast. RADIATION  DOSE REDUCTION: This exam was performed according to the departmental dose-optimization program which includes automated exposure control, adjustment of the mA and/or kV according to patient size and/or use of iterative reconstruction technique. COMPARISON:  March 24, 2022 FINDINGS: Brain: There is generalized cerebral atrophy with widening of the extra-axial spaces and ventricular dilatation. There are areas of decreased attenuation within the white matter tracts of the supratentorial brain, consistent with microvascular disease changes. Vascular: Moderate severity bilateral cavernous carotid artery calcification is noted. Skull: Normal. Negative for fracture or focal lesion. Sinuses/Orbits: A 9 mm right maxillary sinus polyp versus mucous retention cyst is noted. Other: A stable partially calcified scalp soft tissue nodules are seen. IMPRESSION: 1. Generalized cerebral atrophy with chronic white matter small vessel ischemic changes. 2. No acute intracranial abnormality. Electronically Signed   By:  Virgle Grime M.D.   On: 12/17/2023 16:21   DG Chest Port 1 View Result Date: 12/17/2023 CLINICAL DATA:  Syncope. EXAM: PORTABLE CHEST 1 VIEW COMPARISON:  April 09, 2021. FINDINGS: The heart size and mediastinal contours are within normal limits. Both lungs are clear. The visualized skeletal structures are unremarkable. IMPRESSION: No active disease. Electronically Signed   By: Rosalene Colon M.D.   On: 12/17/2023 14:16    Cardiac Studies   Echo ordered   Patient Profile      [name] is a 89 y.o. female with a hx of Parkinson's, hypothyroidism with thyroidectomy, hypertension who is being seen 12/17/2023 for the evaluation of syncope at the request of ED.   Assessment & Plan     Syncope   Pt admitted after near syncopal spell    Per patients husband she has had many spells over the past year, particularly if getting out of a  hot shower (has seat in shower)  BP readings this am   Orthostatics done   Pt's BP drops 20 pts with no increase in HR   Overall her HR is a little faster today on tele than  yesterday   Her carvedilol  has been held     Recomm Follow BP   I would allow it to run a little higher give orthostatic tendencies    She was on amlodipine 2.5 at home   INcreased to 5 mig yesterday If needs to go back on b blocker would use a lower dose   WOuld benefit from an abdominal binder, support socks     Echo ordered  I am not sure it will add much to above    WIll make sure that pt hs monitor on D/C For questions or updates, please contact [facility] Please consult [email or web address] for contact info under        Signed, Ola Berger, MD  12/18/2023, 7:51 AM

## 2023-12-18 NOTE — Evaluation (Signed)
 Clinical/Bedside Swallow Evaluation Patient Details  Name: Regina LINKENHOKER MRN: [id number] Date of Birth: 1933-02-11  Today's Date: 12/18/2023 Time: SLP Start Time (ACUTE ONLY): 1340 SLP Stop Time (ACUTE ONLY): 1350 SLP Time Calculation (min) (ACUTE ONLY): 10 min  Past Medical History:  Past Medical History:  Diagnosis Date   Allergy    Barrett esophagus 08/12/2008   Cancer (HCC)    GERD (gastroesophageal reflux disease)    Hypertension    Thyroid  disease    UTI (urinary tract infection)    Past Surgical History:  Past Surgical History:  Procedure Laterality Date   APPENDECTOMY     COLON SURGERY     COLONOSCOPY     COLONOSCOPY N/A 08/03/2014   Procedure: COLONOSCOPY;  Surgeon: Claudette Cue, MD;  Location: [facility] ENDOSCOPY;  Service: Endoscopy;  Laterality: N/A;   POLYPECTOMY     TONSILLECTOMY     TOTAL THYROIDECTOMY     TUBAL LIGATION     HPI:  Pt is 89 yo female presenting 12/17/23 after passing out. PMH: Parkinsons, cancer (colon), GERD, HTN, Thyroid  disease, UTI, memory impairment, CKD    Assessment / Plan / Recommendation  Clinical Impression  Pt demonstrates no signs of aspiration or dysphagia. Family at bedside have not observed difficulty. Pt also denies. Continue regular diet and thin liquids. Will sign off. SLP Visit Diagnosis: Dysphagia, unspecified (R13.10)    Aspiration Risk  No limitations    Diet Recommendation Regular;Thin liquid    Liquid Administration via: Cup;Straw Medication Administration: Whole meds with liquid Supervision: Patient able to self feed    Other  Recommendations      Recommendations for follow up therapy are one component of a multi-disciplinary discharge planning process, led by the attending physician.  Recommendations may be updated based on patient status, additional functional criteria and insurance authorization.  Follow up Recommendations        Assistance Recommended at Discharge    Functional Status Assessment    Frequency  and Duration            Prognosis        Swallow Study   General HPI: Pt is 89 yo female presenting 12/17/23 after passing out. PMH: Parkinsons, cancer (colon), GERD, HTN, Thyroid  disease, UTI, memory impairment, CKD Type of Study: Bedside Swallow Evaluation Previous Swallow Assessment: none Diet Prior to this Study: Regular;Thin liquids (Level 0) Temperature Spikes Noted: No Respiratory Status: Room air History of Recent Intubation: No Behavior/Cognition: Alert;Cooperative;Pleasant mood Oral Cavity Assessment: Within Functional Limits Oral Care Completed by SLP: No Oral Cavity - Dentition: Adequate natural dentition Vision: Functional for self-feeding Self-Feeding Abilities: Able to feed self Patient Positioning: Upright in bed Baseline Vocal Quality: Normal Volitional Cough: Strong    Oral/Motor/Sensory Function Overall Oral Motor/Sensory Function: Within functional limits   Ice Chips     Thin Liquid Thin Liquid: Within functional limits    Nectar Thick Nectar Thick Liquid: Not tested   Honey Thick Honey Thick Liquid: Not tested   Puree Puree: Within functional limits   Solid     Solid: Within functional limits      [name], [name] 12/18/2023,2:09 PM

## 2023-12-18 NOTE — Evaluation (Signed)
 Occupational Therapy Evaluation Patient Details Name: Regina [name] MRN: [id number] DOB: Feb 04, 1933 Today's Date: 12/18/2023   History of Present Illness   Pt is 89 yo female presenting 12/17/23 after passing out. PMH: Parkinsons, cancer (colon), GERD, HTN, Thyroid  disease, UTI, memory impairment, CKD     Clinical Impressions Pt admitted based on above, and was seen based on problem list below. PTA pt wasliving with her spouse who was providing 24/7 care along with a HH aid 5x a week. Pt was receiving receiving min to mod assistance with most ADLs. Today pt is requiring set up  to total assist for ADLs. Functional transfers vary from min to mod assist. Pt would benefit from <3 hours of skilled rehab daily. If pt will not qualify, then pt can d/c home with following DME and max HH services to reduce caregiver burden. OT will continue to follow acutely to maximize functional independence.        If plan is discharge home, recommend the following:   A lot of help with walking and/or transfers;A lot of help with bathing/dressing/bathroom;Assistance with cooking/housework;Direct supervision/assist for medications management;Supervision due to cognitive status;Direct supervision/assist for financial management     Functional Status Assessment   Patient has had a recent decline in their functional status and demonstrates the ability to make significant improvements in function in a reasonable and predictable amount of time.     Equipment Recommendations   BSC/3in1     Recommendations for Other Services         Precautions/Restrictions   Precautions Precautions: Fall Precaution/Restrictions Comments: husband reports 2 falls in 3 weeks Restrictions Weight Bearing Restrictions Per Provider Order: No     Mobility Bed Mobility Overal bed mobility: Needs Assistance       General bed mobility comments: Received in recliner    Transfers Overall transfer level: Needs  assistance Equipment used: Rolling walker (2 wheels) Transfers: Sit to/from Stand, Bed to chair/wheelchair/BSC Sit to Stand: Mod assist     Step pivot transfers: Min assist     General transfer comment: Pt assist levels vary by trial inital STS from recliner CGA, from [facility] mod assist      Balance Overall balance assessment: Needs assistance Sitting-balance support: No upper extremity supported, Feet supported Sitting balance-Leahy Scale: Fair     Standing balance support: Bilateral upper extremity supported, Reliant on assistive device for balance Standing balance-Leahy Scale: Poor Standing balance comment: Reliant on RW         ADL either performed or assessed with clinical judgement   ADL Overall ADL's : Needs assistance/impaired Eating/Feeding: Set up;Sitting;Minimal assistance Eating/Feeding Details (indicate cue type and reason): Spouse assistting PRN Grooming: Wash/dry hands;Wash/dry face;Contact guard assist;Standing Grooming Details (indicate cue type and reason): Pt CGA to wash hands at sink, set up for face seated Upper Body Bathing: Minimal assistance;Sitting   Lower Body Bathing: Moderate assistance;Sit to/from stand   Upper Body Dressing : Minimal assistance;Sitting   Lower Body Dressing: Maximal assistance;Sit to/from stand   Toilet Transfer: BSC/3in1;Minimal assistance;Ambulation Toilet Transfer Details (indicate cue type and reason): Steadying assist Toileting- Clothing Manipulation and Hygiene: Total assistance;Sit to/from stand Toileting - Clothing Manipulation Details (indicate cue type and reason): Pt able to stand, OT completed hygiene     Functional mobility during ADLs: Minimal assistance;Rolling walker (2 wheels) General ADL Comments: Pt is limited d/t decreased bil shoulder ROM and tremors     Vision Baseline Vision/History: 1 Wears glasses Vision Assessment?: No apparent visual deficits  Pertinent Vitals/Pain Pain  Assessment Pain Assessment: Faces Faces Pain Scale: Hurts little more Pain Location: neck Pain Descriptors / Indicators: Aching Pain Intervention(s): Monitored during session     Extremity/Trunk Assessment Upper Extremity Assessment Upper Extremity Assessment: Generalized weakness;RUE deficits/detail;LUE deficits/detail RUE Deficits / Details: Decreased shoulder ROM in all planes, tremors with movement RUE Coordination: decreased fine motor;decreased gross motor LUE Deficits / Details: Decreased shoulder ROM in all planes, tremors with movement   Lower Extremity Assessment Lower Extremity Assessment: Defer to PT evaluation   Cervical / Trunk Assessment Cervical / Trunk Assessment: Kyphotic   Communication Communication Communication: Impaired Factors Affecting Communication: Hearing impaired   Cognition Arousal: Alert Behavior During Therapy: Flat affect Cognition: History of cognitive impairments       OT - Cognition Comments: Pt with decreased safety awareness and insight into deficits and limitations       Following commands: Impaired Following commands impaired: Follows one step commands inconsistently, Follows one step commands with increased time     Cueing  General Comments   Cueing Techniques: Verbal cues;Tactile cues  Husband present for session, NCM and MD also entered during session           Home Living Family/patient expects to be discharged to:: Private residence Living Arrangements: Spouse/significant other Available Help at Discharge: Family;Personal care attendant;Available 24 hours/day (aide 10-2 M-F) Type of Home: House Home Access: Stairs to enter [employer] of Steps: 2 Entrance Stairs-Rails: None Home Layout: One level     Bathroom Shower/Tub: Producer, television/film/video: Handicapped height     Home Equipment: Rollator (4 wheels);Rolling Walker (2 wheels);Shower seat;Grab bars - tub/shower          Prior  Functioning/Environment Prior Level of Function : Needs assist     Mobility Comments: uses RW inside, rollator outside; requires assist when goes to the bathroom ADLs Comments: Spouse assisting with all ADLs, except self feeding, per spouse pt has purewick at home for toileting    OT Problem List: Decreased strength;Decreased range of motion;Decreased activity tolerance;Impaired balance (sitting and/or standing);Decreased cognition;Decreased safety awareness;Decreased knowledge of use of DME or AE;Pain   OT Treatment/Interventions: Self-care/ADL training;Therapeutic exercise;Energy conservation;DME and/or AE instruction;Modalities;Therapeutic activities;Patient/family education;Balance training      OT Goals(Current goals can be found in the care plan section)   Acute Rehab OT Goals Patient Stated Goal: Per spouse to go to rehab OT Goal Formulation: With family Time For Goal Achievement: 01/01/24 Potential to Achieve Goals: Fair   OT Frequency:  Min 1X/week       AM-PAC OT "6 Clicks" Daily Activity     Outcome Measure Help from another person eating meals?: A Little Help from another person taking care of personal grooming?: A Little Help from another person toileting, which includes using toliet, bedpan, or urinal?: Total Help from another person bathing (including washing, rinsing, drying)?: A Lot Help from another person to put on and taking off regular upper body clothing?: A Little Help from another person to put on and taking off regular lower body clothing?: A Lot 6 Click Score: 14   End of Session Equipment Utilized During Treatment: Rolling walker (2 wheels);Gait belt Nurse Communication: Mobility status  Activity Tolerance: Patient tolerated treatment well Patient left: in chair;with call bell/phone within reach;with chair alarm set;with family/visitor present;Other (comment) (MD present)  OT Visit Diagnosis: Unsteadiness on feet (R26.81);Other abnormalities of gait  and mobility (R26.89);Muscle weakness (generalized) (M62.81);Repeated falls (R29.6)  Time: 4098-1191 OT Time Calculation (min): 35 min Charges:  OT General Charges $OT Visit: 1 Visit OT Evaluation $OT Eval Moderate Complexity: 1 Mod OT Treatments $Self Care/Home Management : 8-22 mins  Delmer Ferraris, OT  Acute Rehabilitation Services Office [phone] Secure chat preferred   Mickael Alamo 12/18/2023, 12:50 PM

## 2023-12-18 NOTE — Care Management Obs Status (Signed)
 MEDICARE OBSERVATION STATUS NOTIFICATION   Patient Details  Name: Regina [name] MRN: [id number] Date of Birth: 07-06-1933   Medicare Observation Status Notification Given:  Yes    Terre Ferri, RN 12/18/2023, 10:26 AM

## 2023-12-18 NOTE — Evaluation (Addendum)
 Physical Therapy Evaluation Patient Details Name: Regina [name] MRN: [id number] DOB: Nov 10, 1932 Today's Date: 12/18/2023  History of Present Illness  Pt is 89 yo female presenting 12/17/23 after passing out. PMH: Parkinsons, cancer (colon), GERD, HTN, Thyroid  disease, UTI, memory impairment, CKD  Clinical Impression  Pt admitted secondary to problem above with deficits below. PTA patient lives with husband with 24/7 care (including an aide 10-2 M-F). She walks with a RW and has had 2 falls in 3 weeks per husband.  Pt currently requires min assist for bed mobility, mod assist for sit to stand and min assist for ambulation with RW with running into objects on her right and very unsafe with turning RW. She had +orthostatic BPs (see flowsheet) with pt reporting dizziness and fatigue limiting ability to stand x 3 minutes. Pt and husband expressing concern re: recent falls and inquiring about short-term rehab. Confirmed with Child psychotherapist that currently pt is observation status and would not qualify, although feel she could benefit from inpatient therapies <3 hrs/day. If this is not an option, recommend max HH services including HHPT.  Anticipate patient will benefit from PT to address problems listed below. Will continue to follow acutely to maximize functional mobility, independence, and safety.      13:31 Addendum- Husband is now pursuing private pay SNF for short-term rehab. To clarify, feel pt can benefit from SNF to address her deficits listed below that contribute to her incr risk for falls.       If plan is discharge home, recommend the following: A lot of help with walking and/or transfers;Direct supervision/assist for medications management;Direct supervision/assist for financial management;Assist for transportation;Help with stairs or ramp for entrance;Supervision due to cognitive status   Can travel by private vehicle   No    Equipment Recommendations None recommended by PT  Recommendations  for Other Services  OT consult    Functional Status Assessment Patient has had a recent decline in their functional status and demonstrates the ability to make significant improvements in function in a reasonable and predictable amount of time.     Precautions / Restrictions Precautions Precautions: Fall Precaution/Restrictions Comments: husband reports 2 falls in 3 weeks Restrictions Weight Bearing Restrictions Per Provider Order: No      Mobility  Bed Mobility Overal bed mobility: Needs Assistance Bed Mobility: Rolling, Sidelying to Sit Rolling: Min assist Sidelying to sit: Min assist, HOB elevated       General bed mobility comments: reaches for assist    Transfers Overall transfer level: Needs assistance Equipment used: Rolling walker (2 wheels) Transfers: Sit to/from Stand, Bed to chair/wheelchair/BSC Sit to Stand: Mod assist   Step pivot transfers: Mod assist       General transfer comment: initial stand from EOB unsuccessful due to strong posterior lean with return to sit; mod assist for transition over her feet; from recliner min assist using armrests    Ambulation/Gait Ambulation/Gait assistance: Min assist Gait Distance (Feet): 80 Feet Assistive device: Rolling walker (2 wheels) Gait Pattern/deviations: Step-through pattern, Decreased stride length, Decreased dorsiflexion - right, Knee flexed in stance - right, Steppage, Drifts right/left   Gait velocity interpretation: 1.31 - 2.62 ft/sec, indicative of limited community ambulator   General Gait Details: pt consistently drifting to her Right requiring cues to avoid obstacles; denies difficulty seeing objects; very unsafe lifting of RW during 180 degree turn  Stairs            Wheelchair Mobility     Tilt Bed  Modified Rankin (Stroke Patients Only)       Balance Overall balance assessment: Needs assistance Sitting-balance support: No upper extremity supported, Feet supported Sitting  balance-Leahy Scale: Fair     Standing balance support: Bilateral upper extremity supported, Reliant on assistive device for balance Standing balance-Leahy Scale: Poor                               Pertinent Vitals/Pain Pain Assessment Pain Assessment: Faces Faces Pain Scale: Hurts little more Pain Location: neck, "all over" Pain Descriptors / Indicators: Aching Pain Intervention(s): Limited activity within patient's tolerance, Monitored during session, Repositioned    Home Living Family/patient expects to be discharged to:: Private residence Living Arrangements: Spouse/significant other Available Help at Discharge: Family;Personal care attendant;Available 24 hours/day (aide 10-2 M-F) Type of Home: House Home Access: Stairs to enter Entrance Stairs-Rails: None Entrance Stairs-Number of Steps: 2   Home Layout: One level Home Equipment: Rollator (4 wheels);Rolling Walker (2 wheels);Shower seat;Grab bars - tub/shower      Prior Function Prior Level of Function : Needs assist             Mobility Comments: uses RW inside, rollator outside; requires assist when goes to the bathroom       Extremity/Trunk Assessment   Upper Extremity Assessment Upper Extremity Assessment: Defer to OT evaluation    Lower Extremity Assessment Lower Extremity Assessment: Generalized weakness    Cervical / Trunk Assessment Cervical / Trunk Assessment: Kyphotic  Communication   Communication Communication: Impaired Factors Affecting Communication: Hearing impaired    Cognition Arousal: Alert Behavior During Therapy: Flat affect   PT - Cognitive impairments: History of cognitive impairments                       PT - Cognition Comments: oriented to self, place, reason for admission (fall, although she describes a different scenario) Following commands: Intact       Cueing Cueing Techniques: Verbal cues, Tactile cues     General Comments General comments  (skin integrity, edema, etc.): see vitals flowsheet for orthostatic BPs (+)    Exercises     Assessment/Plan    PT Assessment Patient needs continued PT services  PT Problem List Decreased strength;Decreased activity tolerance;Decreased balance;Decreased mobility;Decreased cognition;Decreased knowledge of use of DME;Cardiopulmonary status limiting activity;Pain       PT Treatment Interventions DME instruction;Gait training;Stair training;Functional mobility training;Therapeutic activities;Therapeutic exercise;Balance training;Cognitive remediation;Patient/family education    PT Goals (Current goals can be found in the Care Plan section)  Acute Rehab PT Goals Patient Stated Goal: go to [facility] PT Goal Formulation: With patient/family Time For Goal Achievement: 01/01/24 Potential to Achieve Goals: Good    Frequency Min 2X/week     Co-evaluation               AM-PAC PT "6 Clicks" Mobility  Outcome Measure Help needed turning from your back to your side while in a flat bed without using bedrails?: A Little Help needed moving from lying on your back to sitting on the side of a flat bed without using bedrails?: A Little Help needed moving to and from a bed to a chair (including a wheelchair)?: A Lot Help needed standing up from a chair using your arms (e.g., wheelchair or bedside chair)?: A Lot Help needed to walk in hospital room?: A Little Help needed climbing 3-5 steps with a railing? : A Little 6 Click  Score: 16    End of Session Equipment Utilized During Treatment: Gait belt Activity Tolerance: Patient limited by fatigue Patient left: in chair;with call bell/phone within reach;with chair alarm set;with family/visitor present Nurse Communication: Mobility status;Other (comment) (currently doesn't qualify for SNf) PT Visit Diagnosis: Repeated falls (R29.6);Muscle weakness (generalized) (M62.81);Other abnormalities of gait and mobility (R26.89)    Time:  1324-4010 PT Time Calculation (min) (ACUTE ONLY): 44 min   Charges:   PT Evaluation $PT Eval Moderate Complexity: 1 Mod PT Treatments $Gait Training: 23-37 mins PT General Charges $$ ACUTE PT VISIT: 1 Visit          Gayle Kava, PT Acute Rehabilitation Services  Office [phone]   Guilford Leep 12/18/2023, 9:57 AM

## 2023-12-18 NOTE — Progress Notes (Signed)
   Patient will need a live Zio monitor placed prior to discharge to SNF. Patient will not be discharged today. Asked [facility] to let us  know when patient will be discharge so that we can place order for monitor on that day.  Gabrial Poppell E Elsie Baynes, PA-C 12/18/2023 1:23 PM

## 2023-12-18 NOTE — TOC Progression Note (Addendum)
 Transition of Care [facility]) - Progression Note    Patient Details  Name: Regina [name] MRN: [id number] Date of Birth: 1933/05/19  Transition of Care [facility]) CM/SW Contact  Ernst Heap Phone Number: [phone] 12/18/2023, 1:52 PM  Clinical Narrative:   HF CSW called and spoke with the patients spouse who about dc plan. CSW inquired with patient about SNF choice. Per medical team patient was adamant about his wife going to a SNF. Patients husband stated that he thinks rehab is the best for patient. CSW explained that for [facility] the daily rate is $520 and would have to be paid upfront. Patient is agreeable. CSW explained that PT recommended 7 days of rehab.   CSW completed passr, FL2, and faxed the patient out to desired SNF.   CSW spoke with Sherrlyn Dolores at [facility] who stated that they are currently accepting private paying patients at this time. Sherrlyn Dolores will contact the patients husband to discuss billing.   Plan is for 5/9 DC to [facility] SN: Patients room # is 206  The number to call for report is [phone]  [facility] will continue following.     Expected Discharge Plan: Home w Home Health Services Barriers to Discharge: Continued Medical Work up  Expected Discharge Plan and Services   Discharge Planning Services: CM Consult Post Acute Care Choice: Home Health Living arrangements for the past 2 months: Skilled Nursing Facility                             [facility] Agency: [facility] Date [facility] Agency Contacted: 12/18/23 Time HH Agency Contacted: 1033 Representative spoke with at [facility] Agency: Imelda Man   Social Determinants of Health (SDOH) Interventions SDOH Screenings   Food Insecurity: No Food Insecurity (12/17/2023)  Housing: Low Risk  (12/17/2023)  Transportation Needs: No Transportation Needs (12/17/2023)  Utilities: Not At Risk (12/17/2023)  Social Connections: Socially Integrated (12/17/2023)  Tobacco Use: Low Risk  (12/17/2023)    Readmission Risk Interventions      No data to display

## 2023-12-19 ENCOUNTER — Inpatient Hospital Stay (HOSPITAL_COMMUNITY): Admit: 2023-12-19 | Discharge: 2023-12-19 | Disposition: A | Discharge status: Home / Self Care | Attending: Student

## 2023-12-19 DIAGNOSIS — I951 Orthostatic hypotension: Secondary | ICD-10-CM | POA: Insufficient documentation

## 2023-12-19 DIAGNOSIS — R55 Syncope and collapse: Secondary | ICD-10-CM

## 2023-12-19 DIAGNOSIS — R001 Bradycardia, unspecified: Secondary | ICD-10-CM | POA: Insufficient documentation

## 2023-12-19 MED ORDER — AMLODIPINE BESYLATE 5 MG PO TABS: 5.0000 mg | ORAL_TABLET | Freq: Every day | ORAL | 0 refills | Status: DC

## 2023-12-19 NOTE — Progress Notes (Addendum)
 DISCHARGE NOTE HOME Regina [name] to be discharged [facility] per MD order. Discussed prescriptions and follow up appointments with the patient. Prescriptions given to patient; medication list explained in detail. Patient verbalized understanding.  Skin clean, dry and intact without evidence of skin break down, no evidence of skin tears noted. IV catheter discontinued intact. Site without signs and symptoms of complications. Dressing and pressure applied. Pt denies pain at the site currently. No complaints noted.  Discharge with items noted on LDA left elbow  An After Visit Summary (AVS) was printed and given to the patient. Patient escorted via wheelchair, and discharged [facility] via private auto. Taken by family  [name], RN

## 2023-12-19 NOTE — Progress Notes (Signed)
 Mobility Specialist Progress Note:   12/19/23 1213  Mobility  Activity  (bed mobility)  Level of Assistance Minimal assist, patient does 75% or more  Assistive Device Other (Comment) (HHA)  Activity Response Tolerated fair  Mobility Referral Yes  Mobility visit 1 Mobility  Mobility Specialist Start Time (ACUTE ONLY) [id number]  Mobility Specialist Stop Time (ACUTE ONLY) 1004  Mobility Specialist Time Calculation (min) (ACUTE ONLY) 12 min   Pt received in bed agreeable to getting orthostatic vitals. Lying BP was 156/64. Pt started to bring legs towards EOB before yelling she's "in pain everywhere". Declined further mobility. Situated back comfortable. Call bell and personal belongings in reach. Bed alarm on. All needs met. RN in room.  Inetta Manes Mobility Specialist  Please contact vis Secure Chat or  Rehab Office [phone]

## 2023-12-19 NOTE — Plan of Care (Signed)
  Problem: Clinical Measurements: Goal: Ability to maintain clinical measurements within normal limits will improve Outcome: Progressing   Problem: Clinical Measurements: Goal: Will remain free from infection Outcome: Progressing   Problem: Clinical Measurements: Goal: Respiratory complications will improve Outcome: Progressing   Problem: Clinical Measurements: Goal: Cardiovascular complication will be avoided Outcome: Progressing   Problem: Coping: Goal: Level of anxiety will decrease Outcome: Progressing   Problem: Nutrition: Goal: Adequate nutrition will be maintained Outcome: Progressing   Problem: Safety: Goal: Ability to remain free from injury will improve Outcome: Progressing   Problem: Skin Integrity: Goal: Risk for impaired skin integrity will decrease Outcome: Progressing

## 2023-12-19 NOTE — Progress Notes (Signed)
 Physical Therapy Treatment Patient Details Name: Regina [name] MRN: [id number] DOB: 08/24/1932 Today's Date: 12/19/2023   History of Present Illness Pt is 89 yo female presenting 12/17/23 after passing out. PMH: Parkinsons, cancer (colon), GERD, HTN, Thyroid  disease, UTI, memory impairment, CKD    PT Comments  Patient seated EOB on arrival struggling to dress and attempting to don brief with family present to assist. Pt required Mod assist for sit<>stand HHA, posterior lean noted on initial rise and mod assist required to fully pull up brief. Pt returned to sitting with cues and mod assist provided for upper body dressing in sitting for safety and max assist to thread pants legs and don socks/shoes.  2 additional stands completed and pt repositioned at EOB with daughter present at EOS. RN notified of pt readiness for discharge to facility. Will progress as able in acute stay. Patient will benefit from continued inpatient follow up therapy, <3 hours/day.     If plan is discharge home, recommend the following: A lot of help with walking and/or transfers;Direct supervision/assist for medications management;Direct supervision/assist for financial management;Assist for transportation;Help with stairs or ramp for entrance;Supervision due to cognitive status   Can travel by private vehicle     No  Equipment Recommendations  None recommended by PT    Recommendations for Other Services       Precautions / Restrictions Precautions Precautions: Fall Precaution/Restrictions Comments: husband reports 2 falls in 3 weeks Restrictions Weight Bearing Restrictions Per Provider Order: No     Mobility  Bed Mobility               General bed mobility comments: sitting EOB at start, intermittent cues and assist for balance as pt with tendency to lean posteriorly    Transfers Overall transfer level: Needs assistance Equipment used: 1 person hand held assist, 2 person hand held assist Transfers: Sit  to/from Stand Sit to Stand: Mod assist, Min assist           General transfer comment: 3x sit<>stand from EOB for lower body dressing. Min-Mod assist with pt leanign posteriorly on initial stand but improved anterior weight shift with each transfer.    Ambulation/Gait                   Stairs             Wheelchair Mobility     Tilt Bed    Modified Rankin (Stroke Patients Only)       Balance Overall balance assessment: Needs assistance Sitting-balance support: No upper extremity supported, Feet supported Sitting balance-Leahy Scale: Fair   Postural control: Posterior lean Standing balance support: Bilateral upper extremity supported, Reliant on assistive device for balance Standing balance-Leahy Scale: Poor                              Communication Communication Communication: Impaired Factors Affecting Communication: Hearing impaired  Cognition Arousal: Alert Behavior During Therapy: Flat affect   PT - Cognitive impairments: History of cognitive impairments                       PT - Cognition Comments: pt pleasant and oriented to self, place, and situation. smiling occasionally. pt HOH. Following commands: Impaired Following commands impaired: Follows one step commands with increased time, Follows multi-step commands with increased time, Follows multi-step commands inconsistently    Cueing Cueing Techniques: Verbal cues, Tactile cues  Exercises  General Comments        Pertinent Vitals/Pain Pain Assessment Pain Assessment: No/denies pain Pain Intervention(s): Monitored during session, Repositioned    Home Living                          Prior Function            PT Goals (current goals can now be found in the care plan section) Acute Rehab PT Goals Patient Stated Goal: go to [facility] PT Goal Formulation: With patient/family Time For Goal Achievement: 01/01/24 Potential to Achieve Goals:  Good Progress towards PT goals: Progressing toward goals    Frequency    Min 2X/week      PT Plan      Co-evaluation              AM-PAC PT "6 Clicks" Mobility   Outcome Measure  Help needed turning from your back to your side while in a flat bed without using bedrails?: A Little Help needed moving from lying on your back to sitting on the side of a flat bed without using bedrails?: A Little Help needed moving to and from a bed to a chair (including a wheelchair)?: A Lot Help needed standing up from a chair using your arms (e.g., wheelchair or bedside chair)?: A Lot Help needed to walk in hospital room?: A Little Help needed climbing 3-5 steps with a railing? : A Lot 6 Click Score: 15    End of Session Equipment Utilized During Treatment: Gait belt Activity Tolerance: Patient tolerated treatment well Patient left: in bed;with call bell/phone within reach;with family/visitor present (seated EOB) Nurse Communication: Mobility status PT Visit Diagnosis: Repeated falls (R29.6);Muscle weakness (generalized) (M62.81);Other abnormalities of gait and mobility (R26.89)     Time: 4098-1191 PT Time Calculation (min) (ACUTE ONLY): 18 min  Charges:    $Therapeutic Activity: 8-22 mins PT General Charges $$ ACUTE PT VISIT: 1 Visit                     Tish Forge, DPT Acute Rehabilitation Services Office [phone]  12/19/23 4:58 PM

## 2023-12-19 NOTE — TOC Transition Note (Signed)
 Transition of Care [facility]) - Discharge Note   Patient Details  Name: Regina [name] MRN: [id number] Date of Birth: Nov 19, 1932  [facility]) CM/SW Contact:  Marija Calamari A [place], LCSW Phone Number: 12/19/2023, 12:24 PM   Clinical Narrative:     Patient will DC to: [facility]  Anticipated DC date: 12/19/23  Family notified: [facility]  Transport by: Royston Cornea, spouse, private vehicle to rehab      Per MD patient ready for DC to [facility]. RN, patient, patient's family, and facility notified of DC. Discharge Summary and FL2 sent to facility. RN to call report prior to discharge (room # is 206, [phone]). DC packet on chart.      CSW will sign off for now as social work intervention is no longer needed. Please consult us  again if new needs arise.   Final next level of care: Skilled Nursing Facility Barriers to Discharge: Barriers Resolved   Patient Goals and CMS Choice   CMS Medicare.gov Compare Post Acute Care list provided to:: Patient Choice offered to / list presented to : Patient      Discharge Placement              Patient chooses bed at: [facility] Patient to be transferred to facility by: Pt's spouse, Royston Cornea, in private vehicle Name of family member notified: [facility] Patient and family notified of of transfer: 12/19/23  Discharge Plan and Services Additional resources added to the After Visit Summary for     Discharge Planning Services: CM Consult Post Acute Care Choice: Home Health                      [facility] Agency: [facility] Date [facility] Agency Contacted: 12/18/23 Time HH Agency Contacted: 1033 Representative spoke with at [facility] Agency: Imelda Man  Social Drivers of Health (SDOH) Interventions SDOH Screenings   Food Insecurity: No Food Insecurity (12/17/2023)  Housing: Low Risk  (12/17/2023)  Transportation Needs: No Transportation Needs (12/17/2023)  Utilities: Not At Risk (12/17/2023)  Social Connections: Socially Integrated  (12/17/2023)  Tobacco Use: Low Risk  (12/17/2023)     Readmission Risk Interventions     No data to display

## 2023-12-19 NOTE — IPAL (Addendum)
  Interdisciplinary Goals of Care Family Meeting   Date carried out: 12/19/2023  Location of the meeting: Bedside  Member's involved: Physician and Family Member or next of kin  Durable Power of Attorney or Environmental health practitioner: Patient's husband  Discussion: We discussed goals of care for Regina [name] with her husband.  He notified that patient is DNR and they have living will as well.  Code status:   Code Status: Limited: Do not attempt resuscitation (DNR) -DNR-LIMITED -Do Not Intubate/DNI    Disposition: SNF/LTAC  Time spent for the meeting: 25 minutes    Modena Andes, MD  12/19/2023, 9:00 AM

## 2023-12-19 NOTE — Discharge Summary (Addendum)
 Physician Discharge Summary  Regina [name] ZOX:[id number] DOB: 1933-03-13 DOA: 12/17/2023  PCP: Edda Goo, MD  Admit date: 12/17/2023 Discharge date: 12/19/2023    Admitted From: Home Disposition: SNF  Recommendations for Outpatient Follow-up:  Follow up with PCP in 1-2 weeks Please obtain BMP/CBC in one week Please follow up with your PCP on the following pending results: Unresulted Labs (From admission, onward)     Start     Ordered   12/24/23 0500  Creatinine, serum  (enoxaparin  (LOVENOX )    CrCl >/= 30 ml/min)  Weekly,   R     Comments: while on enoxaparin  therapy    12/17/23 1554   12/17/23 1458  Urinalysis, Routine w reflex microscopic -Urine, Clean Catch  Once,   URGENT       Question:  Specimen Source  Answer:  Urine, Clean Catch   12/17/23 1457              Home Health: None Equipment/Devices: None  Discharge Condition: Stable CODE STATUS: DNR Diet recommendation: Cardiac  Subjective: Seen and examined, other than chronic neck pain, she has no complaint.  Daughter at the bedside.  She is in agreement with discharging to SNF today.  Brief/Interim Summary: Regina [name] is a 89 y.o. female with medical history significant of Parkinson disease, hypertension, hypothyroidism and CKD stage IIIa was brought into the emergency department due to an episode of passing out.  She does not have much of a cardiac history.  She has an office note from 2016 where she was seen for episode of syncope felt to be vasovagal and related to GI bleed.  Echocardiogram with preserved EF no significant valvular disease.  Put on Coreg  6.25 mg twice daily for blood pressure.  Per husband she went to go to the bathroom to wash out her mouth.  They were talking and he went to go check up on her and she was bent over holding herself up on the door and the sink.  At some point started become lethargic and husband reports that he had to hold her out.  He thinks that she lost consciousness but she reports  hearing him the entire time but not able to say anything.  Reports no falls during this episode however frequently falls at home.  Also has frequent reported episodes of syncope when she goes into a hot shower and has multiple skin tears throughout her body and bruising.  They had called EMS and reportedly was bradycardic with heart rates in the 40s.  Systolics in the 40s?  No reports of her losing a pulse.  Husband reports that they had checked her temperature and it was 80.  1 mg of atropine was given and eventually became arousable.  No seizure-like activity.  Upon arrival to ED, patient was slightly hypothermic with temperature of 95.1, bradycardic with heart rate in 50s but normal blood pressure.  Chest x-Szalkowski unremarkable.  Creatinine only slightly elevated than her baseline.  Hemoglobin is stable with chronic anemia.  Troponins x 2 negative. Cardiology was consulted and patient admitted under hospitalist service.  Details below.   Syncope/sinus bradycardia, POA: Source multifactorial.  Could be cardiogenic due to bradycardia vs vasovagal or orthostatic.  Cardiology saw patient, found to be in sinus bradycardia Hamiter.  Echo showed normal ejection fraction with no significant valvular heart disease.  Patient was also found to have positive orthostatics.  She was hydrated with IV fluids.  Due to her Parkinson's disease, patient does not drink  enough fluid.  Orthostatics improved.  Cardiology is planning to place Zio patch before patient discharges to SNF today.   Patient is not dizzy anymore.  Essential hypertension: Blood pressure only slightly elevated at times.  Continue irbesartan  and we have increased amlodipine  to 5 mg.   CKD stage IIIa: Baseline creatinine appears to be around 1.1-1.2, slightly elevated creatinine of 1.36 which improved with IV fluids.   Parkinson's disease: Continue home medications.   Acquired hypothyroidism: Resume Synthroid .  Check TSH.   Dysuria: UA was ordered but  never tested.  Patient did not complain of any dysuria other than at the time of admission.  She was never treated for antibiotics.  She remained afebrile with no leukocytosis.  Doubt UTI.   Generalized weakness: PT OT recommended SNF.  She is being discharged to SNF in stable condition.  Discharge plan was discussed with patient and/or family member and they verbalized understanding and agreed with it.  Discharge Diagnoses:  Principal Problem:   Syncope Active Problems:   Dizziness   Orthostatic hypotension   Sinus bradycardia    Discharge Instructions   Allergies as of 12/19/2023       Reactions   Tape Other (See Comments)   TAPE PULLS OFF THE SKIN, SO PLEASE USE AN ALTERNATIVE   Penicillins Hives, Itching   Latex Rash, Other (See Comments)   NO Band-Aids!! Pulls off skin         Medication List     STOP taking these medications    carvedilol  6.25 MG tablet Commonly known as: COREG    cephALEXin  250 MG capsule Commonly known as: KEFLEX    senna-docusate 8.6-50 MG tablet Commonly known as: Senokot-S       TAKE these medications    amLODipine  5 MG tablet Commonly known as: NORVASC  Take 1 tablet (5 mg total) by mouth at bedtime. What changed:  medication strength how much to take   calcitRIOL  0.25 MCG capsule Commonly known as: ROCALTROL  Take 0.25 mcg by mouth daily.   carbidopa -levodopa  25-100 MG tablet Commonly known as: Sinemet  Take 1 tablet by mouth 3 (three) times daily.   Gemtesa 75 MG Tabs Generic drug: Vibegron Take 1 tablet by mouth daily.   Synthroid  75 MCG tablet Generic drug: levothyroxine  Take 75 mcg by mouth daily before breakfast.   Systane Ultra PF 0.4-0.3 % Soln Generic drug: Polyethyl Glyc-Propyl Glyc PF Place 1 drop into both eyes in the morning, at noon, and at bedtime.   valsartan 40 MG tablet Commonly known as: DIOVAN Take 40 mg by mouth daily.        Follow-up Information     [facility]  Follow up.   Specialty: Home Health Services Contact information: [address] [place] [address] [place] [address] [phone]         Edda Goo, MD Follow up in 1 week(s).   Specialty: Internal Medicine Contact information: [address] [place] [address] [phone]                Allergies  Allergen Reactions   Tape Other (See Comments)    TAPE PULLS OFF THE SKIN, SO PLEASE USE AN ALTERNATIVE   Penicillins Hives and Itching   Latex Rash and Other (See Comments)    NO Band-Aids!! Pulls off skin     Consultations: Cardiology   Procedures/Studies: ECHOCARDIOGRAM COMPLETE Result Date: 12/18/2023    ECHOCARDIOGRAM REPORT   Patient Name:   Gwynn Lesches Date of Exam:  12/18/2023 Medical Rec #:  [id number]    Height:       67.0 in Accession #:    [id number]   Weight:       134.0 lb Date of Birth:  01-14-1933     BSA:          1.706 m Patient Age:    89 years     BP:           154/78 mmHg Patient Gender: F            HR:           54 bpm. Exam Location:  Inpatient Procedure: 2D Echo, Color Doppler and Cardiac Doppler (Both Spectral and Color            Flow Doppler were utilized during procedure). Indications:    Syncope  History:        Patient has no prior history of Echocardiogram examinations.                 Signs/Symptoms:Syncope; Risk Factors:Hypertension.  Sonographer:    Janette Medley RDCS Referring Phys: Darline Eis HALEY IMPRESSIONS  1. Left ventricular ejection fraction, by estimation, is 65 to 70%. The left ventricle has normal function. The left ventricle has no regional wall motion abnormalities. Left ventricular diastolic parameters are indeterminate.  2. Right ventricular systolic function is normal. The right ventricular size is normal.  3. The mitral valve is normal in structure. Trivial mitral valve regurgitation. No evidence of mitral stenosis.  4. The aortic valve is normal in structure. Aortic valve regurgitation is not visualized. No aortic stenosis is present.   5. The inferior vena cava is normal in size with greater than 50% respiratory variability, suggesting right atrial pressure of 3 mmHg. FINDINGS  Left Ventricle: Left ventricular ejection fraction, by estimation, is 65 to 70%. The left ventricle has normal function. The left ventricle has no regional wall motion abnormalities. The left ventricular internal cavity size was normal in size. There is  no left ventricular hypertrophy. Left ventricular diastolic parameters are indeterminate. Indeterminate filling pressures. Right Ventricle: The right ventricular size is normal. No increase in right ventricular wall thickness. Right ventricular systolic function is normal. Left Atrium: Left atrial size was normal in size. Right Atrium: Right atrial size was normal in size. Pericardium: There is no evidence of pericardial effusion. Mitral Valve: The mitral valve is normal in structure. Trivial mitral valve regurgitation. No evidence of mitral valve stenosis. Tricuspid Valve: The tricuspid valve is normal in structure. Tricuspid valve regurgitation is trivial. No evidence of tricuspid stenosis. Aortic Valve: The aortic valve is normal in structure. Aortic valve regurgitation is not visualized. No aortic stenosis is present. Aortic valve mean gradient measures 5.0 mmHg. Aortic valve peak gradient measures 8.3 mmHg. Aortic valve area, by VTI measures 2.04 cm. Pulmonic Valve: The pulmonic valve was normal in structure. Pulmonic valve regurgitation is trivial. No evidence of pulmonic stenosis. Aorta: The aortic root is normal in size and structure. Venous: The inferior vena cava is normal in size with greater than 50% respiratory variability, suggesting right atrial pressure of 3 mmHg. IAS/Shunts: No atrial level shunt detected by color flow Doppler.  LEFT VENTRICLE PLAX 2D LVIDd:         4.30 cm   Diastology LVIDs:         2.70 cm   LV e' medial:    7.07 cm/s LV PW:         0.90 cm  LV E/e' medial:  13.4 LV IVS:        0.90 cm    LV e' lateral:   9.79 cm/s LVOT diam:     2.00 cm   LV E/e' lateral: 9.7 LV SV:         81 LV SV Index:   48 LVOT Area:     3.14 cm  RIGHT VENTRICLE             IVC RV S prime:     13.20 cm/s  IVC diam: 2.40 cm TAPSE (M-mode): 2.6 cm LEFT ATRIUM             Index        RIGHT ATRIUM           Index LA diam:        3.40 cm 1.99 cm/m   RA Area:     10.00 cm LA Vol (A2C):   41.3 ml 24.21 ml/m  RA Volume:   19.40 ml  11.37 ml/m LA Vol (A4C):   37.8 ml 22.16 ml/m LA Biplane Vol: 42.6 ml 24.97 ml/m  AORTIC VALVE AV Area (Vmax):    2.03 cm AV Area (Vmean):   2.04 cm AV Area (VTI):     2.04 cm AV Vmax:           144.00 cm/s AV Vmean:          112.000 cm/s AV VTI:            0.397 m AV Peak Grad:      8.3 mmHg AV Mean Grad:      5.0 mmHg LVOT Vmax:         92.90 cm/s LVOT Vmean:        72.900 cm/s LVOT VTI:          0.258 m LVOT/AV VTI ratio: 0.65  AORTA Ao Root diam: 2.50 cm Ao Asc diam:  3.10 cm MITRAL VALVE               TRICUSPID VALVE MV Area (PHT): 2.26 cm    TR Peak grad:   20.2 mmHg MV Decel Time: 336 msec    TR Vmax:        225.00 cm/s MV E velocity: 95.00 cm/s MV A velocity: 85.00 cm/s  SHUNTS MV E/A ratio:  1.12        Systemic VTI:  0.26 m                            Systemic Diam: 2.00 cm Maudine Sos MD Electronically signed by Maudine Sos MD Signature Date/Time: 12/18/2023/3:27:49 PM    Final    CT Head Wo Contrast Result Date: 12/17/2023 CLINICAL DATA:  Altered mental status. EXAM: CT HEAD WITHOUT CONTRAST TECHNIQUE: Contiguous axial images were obtained from the base of the skull through the vertex without intravenous contrast. RADIATION DOSE REDUCTION: This exam was performed according to the departmental dose-optimization program which includes automated exposure control, adjustment of the mA and/or kV according to patient size and/or use of iterative reconstruction technique. COMPARISON:  March 24, 2022 FINDINGS: Brain: There is generalized cerebral atrophy with widening of the  extra-axial spaces and ventricular dilatation. There are areas of decreased attenuation within the white matter tracts of the supratentorial brain, consistent with microvascular disease changes. Vascular: Moderate severity bilateral cavernous carotid artery calcification is noted. Skull: Normal. Negative for fracture or focal lesion. Sinuses/Orbits: A 9 mm  right maxillary sinus polyp versus mucous retention cyst is noted. Other: A stable partially calcified scalp soft tissue nodules are seen. IMPRESSION: 1. Generalized cerebral atrophy with chronic white matter small vessel ischemic changes. 2. No acute intracranial abnormality. Electronically Signed   By: Virgle Grime M.D.   On: 12/17/2023 16:21   DG Chest Port 1 View Result Date: 12/17/2023 CLINICAL DATA:  Syncope. EXAM: PORTABLE CHEST 1 VIEW COMPARISON:  April 09, 2021. FINDINGS: The heart size and mediastinal contours are within normal limits. Both lungs are clear. The visualized skeletal structures are unremarkable. IMPRESSION: No active disease. Electronically Signed   By: Rosalene Colon M.D.   On: 12/17/2023 14:16   DG Hand Complete Right Result Date: 12/03/2023 CLINICAL DATA:  Marvell Slider in driveway today. EXAM: RIGHT HAND - COMPLETE 3+ VIEW COMPARISON:  None Available. FINDINGS: There is diffuse decreased bone mineralization. Minimal 1 mm ulnar positive variance. Mild adjacent proximal medial lunate subchondral degenerative cystic change. Moderate radiocarpal joint space narrowing. Mild triscaphe joint space narrowing and peripheral osteophytosis. Mild second through fifth DIP joint space narrowing and peripheral osteophytosis. Mild lateral thumb interphalangeal joint space narrowing. No acute fracture is seen. No dislocation. IMPRESSION: 1. No acute fracture. 2. Mild-to-moderate osteoarthritis. Electronically Signed   By: Bertina Broccoli M.D.   On: 12/03/2023 15:32   DG Forearm Right Result Date: 12/03/2023 CLINICAL DATA:  Marvell Slider in driveway today.  Right hand, forearm, and elbow pain. EXAM: RIGHT FOREARM - 2 VIEW COMPARISON:  None Available. FINDINGS: Mildly decreased bone mineralization. Normal position of the distal anterior humeral fat pad without evidence of elbow joint effusion. Moderate in radiocarpal joint space narrowing. No acute fracture is seen. No dislocation. There is nonspecific round likely calcific density just deep to the skin surface of the volar, slightly lateral/radial aspect of the elbow. IMPRESSION: 1. No acute fracture. 2. Moderate radiocarpal joint space narrowing. Electronically Signed   By: Bertina Broccoli M.D.   On: 12/03/2023 15:31   DG Knee Complete 4 Views Right Result Date: 12/03/2023 CLINICAL DATA:  Pain after fall EXAM: RIGHT KNEE - COMPLETE 4 VIEW COMPARISON:  None Available. FINDINGS: No fracture or dislocation. No joint effusion on lateral view. Preserved joint spaces. Osteopenia. Slight chondrocalcinosis of the medial and lateral compartment. IMPRESSION: Chondrocalcinosis. Electronically Signed   By: Adrianna Horde M.D.   On: 12/03/2023 15:09     Discharge Exam: Vitals:   12/19/23 0500 12/19/23 0828  BP: (!) 152/52 (!) 155/49  Pulse: (!) 58 (!) 57  Resp: 16 18  Temp: 98.6 F (37 C) 98.4 F (36.9 C)  SpO2: 94% 96%   Vitals:   12/18/23 1550 12/18/23 1927 12/19/23 0500 12/19/23 0828  BP: (!) 152/138 (!) 128/47 (!) 152/52 (!) 155/49  Pulse: 64 (!) 56 (!) 58 (!) 57  Resp: 16  16 18   Temp: 98.2 F (36.8 C) 98.6 F (37 C) 98.6 F (37 C) 98.4 F (36.9 C)  TempSrc: Oral Oral  Oral  SpO2: 94% 96% 94% 96%  Weight:      Height:        General: Pt is alert, awake, not in acute distress Cardiovascular: RRR, S1/S2 +, no rubs, no gallops Respiratory: CTA bilaterally, no wheezing, no rhonchi Abdominal: Soft, NT, ND, bowel sounds + Extremities: no edema, no cyanosis    The results of significant diagnostics from this hospitalization (including imaging, microbiology, ancillary and laboratory) are listed  below for reference.     Microbiology: No results found for this  or any previous visit (from the past 240 hours).   Labs: BNP (last 3 results) No results for input(s): "BNP" in the last 8760 hours. Basic Metabolic Panel: Recent Labs  Lab 12/17/23 1147 12/18/23 0623  NA 136 138  K 5.1 3.7  CL 104 108  CO2 25 22  GLUCOSE 135* 80  BUN 26* 22  CREATININE 1.36* 1.25*  CALCIUM 8.8* 8.7*   Liver Function Tests: No results for input(s): "AST", "ALT", "ALKPHOS", "BILITOT", "PROT", "ALBUMIN" in the last 168 hours. No results for input(s): "LIPASE", "AMYLASE" in the last 168 hours. No results for input(s): "AMMONIA" in the last 168 hours. CBC: Recent Labs  Lab 12/17/23 1147 12/18/23 0623  WBC 8.7 9.1  HGB 10.0* 10.2*  HCT 30.3* 29.9*  MCV 98.7 98.0  PLT 165 167   Cardiac Enzymes: No results for input(s): "CKTOTAL", "CKMB", "CKMBINDEX", "TROPONINI" in the last 168 hours. BNP: Invalid input(s): "POCBNP" CBG: No results for input(s): "GLUCAP" in the last 168 hours. D-Dimer No results for input(s): "DDIMER" in the last 72 hours. Hgb A1c No results for input(s): "HGBA1C" in the last 72 hours. Lipid Profile No results for input(s): "CHOL", "HDL", "LDLCALC", "TRIG", "CHOLHDL", "LDLDIRECT" in the last 72 hours. Thyroid  function studies Recent Labs    12/17/23 1356  TSH 3.577   Anemia work up No results for input(s): "VITAMINB12", "FOLATE", "FERRITIN", "TIBC", "IRON", "RETICCTPCT" in the last 72 hours. Urinalysis    Component Value Date/Time   COLORURINE YELLOW 12/03/2023 1607   APPEARANCEUR CLEAR 12/03/2023 1607   APPEARANCEUR Clear 04/22/2018 1608   LABSPEC 1.015 12/03/2023 1607   PHURINE 5.5 12/03/2023 1607   GLUCOSEU NEGATIVE 12/03/2023 1607   HGBUR NEGATIVE 12/03/2023 1607   BILIRUBINUR NEGATIVE 12/03/2023 1607   BILIRUBINUR Negative 04/22/2018 1608   KETONESUR NEGATIVE 12/03/2023 1607   PROTEINUR NEGATIVE 12/03/2023 1607   UROBILINOGEN 0.2 07/30/2014 2003    NITRITE NEGATIVE 12/03/2023 1607   LEUKOCYTESUR NEGATIVE 12/03/2023 1607   Sepsis Labs Recent Labs  Lab 12/17/23 1147 12/18/23 0623  WBC 8.7 9.1   Microbiology No results found for this or any previous visit (from the past 240 hours).  FURTHER DISCHARGE INSTRUCTIONS:   Get Medicines reviewed and adjusted: Please take all your medications with you for your next visit with your Primary MD   Laboratory/radiological data: Please request your Primary MD to go over all hospital tests and procedure/radiological results at the follow up, please ask your Primary MD to get all Hospital records sent to his/her office.   In some cases, they will be blood work, cultures and biopsy results pending at the time of your discharge. Please request that your primary care M.D. goes through all the records of your hospital data and follows up on these results.   Also Note the following: If you experience worsening of your admission symptoms, develop shortness of breath, life threatening emergency, suicidal or homicidal thoughts you must seek medical attention immediately by calling 911 or calling your MD immediately  if symptoms less severe.   You must read complete instructions/literature along with all the possible adverse reactions/side effects for all the Medicines you take and that have been prescribed to you. Take any new Medicines after you have completely understood and accpet all the possible adverse reactions/side effects.    Do not drive when taking Pain medications or sleeping medications (Benzodaizepines)   Do not take more than prescribed Pain, Sleep and Anxiety Medications. It is not advisable to combine anxiety,sleep and pain medications  without talking with your primary care practitioner   Special Instructions: If you have smoked or chewed Tobacco  in the last 2 yrs please stop smoking, stop any regular Alcohol   and or any Recreational drug use.   Wear Seat belts while driving.    Please note: You were cared for by a hospitalist during your hospital stay. Once you are discharged, your primary care physician will handle any further medical issues. Please note that NO REFILLS for any discharge medications will be authorized once you are discharged, as it is imperative that you return to your primary care physician (or establish a relationship with a primary care physician if you do not have one) for your post hospital discharge needs so that they can reassess your need for medications and monitor your lab values  Time coordinating discharge: Over 30 minutes  SIGNED:   Modena Andes, MD  [facility] 12/19/2023, 10:56 AM *Please note that this is a verbal dictation therefore any spelling or grammatical errors are due to the "[employer]" system interpretation. If 7PM-7AM, please contact night-coverage [email or web address]

## 2023-12-19 NOTE — Progress Notes (Signed)
 Rounding Note    Patient Name: Regina [name] Date of Encounter: 12/19/2023  [facility] Cardiologist: None   Subjective   PT laying in bed   No SOB Inpatient Medications    Scheduled Meds:  amLODipine   5 mg Oral QHS   carbidopa -levodopa   1 tablet Oral TID   enoxaparin  (LOVENOX ) injection  30 mg Subcutaneous Q24H   levothyroxine   75 mcg Oral QAC breakfast   sodium chloride  flush  3 mL Intravenous Q12H   Continuous Infusions:   PRN Meds: acetaminophen  **OR** acetaminophen , hydrALAZINE , ondansetron  **OR** ondansetron  (ZOFRAN ) IV   Vital Signs    Vitals:   12/18/23 1431 12/18/23 1550 12/18/23 1927 12/19/23 0500  BP: (!) 115/42 (!) 152/138 (!) 128/47 (!) 152/52  Pulse: (!) 55 64 (!) 56 (!) 58  Resp:  16  16  Temp:  98.2 F (36.8 C) 98.6 F (37 C) 98.6 F (37 C)  TempSrc:  Oral Oral   SpO2: 97% 94% 96% 94%  Weight:      Height:        Orthostatics:   BP laying 154/70  P 62  Sitting  144/50  P 61  Standing 130/93  P 64     Intake/Output Summary (Last 24 hours) at 12/19/2023 0706 Last data filed at 12/19/2023 0457 Gross per 24 hour  Intake --  Output 1000 ml  Net -1000 ml      12/17/2023   11:27 AM 12/03/2023    1:50 PM 07/02/2023   11:03 AM  Last 3 Weights  Weight (lbs) 134 lb 134 lb 11.2 oz 134 lb 12.8 oz  Weight (kg) 60.782 kg 61.1 kg 61.145 kg      Telemetry    NSR   - Personally Reviewed  ECG  No new EKG  Physical Exam   GEN: Frail appearing 89 yo in NAD Neck: No JVD Cardiac: RRR, no murmurs Respiratory: Clear to auscultation bilaterally  GI: Soft, nontender,  MS: No LE  edema  Labs    High Sensitivity Troponin:   Recent Labs  Lab 12/17/23 1147 12/17/23 1356  TROPONINIHS 7 8     Chemistry Recent Labs  Lab 12/17/23 1147 12/18/23 0623  NA 136 138  K 5.1 3.7  CL 104 108  CO2 25 22  GLUCOSE 135* 80  BUN 26* 22  CREATININE 1.36* 1.25*  CALCIUM 8.8* 8.7*  GFRNONAA 37* 41*  ANIONGAP 7 8    Lipids No results for  input(s): "CHOL", "TRIG", "HDL", "LABVLDL", "LDLCALC", "CHOLHDL" in the last 168 hours.  Hematology Recent Labs  Lab 12/17/23 1147 12/18/23 0623  WBC 8.7 9.1  RBC 3.07* 3.05*  HGB 10.0* 10.2*  HCT 30.3* 29.9*  MCV 98.7 98.0  MCH 32.6 33.4  MCHC 33.0 34.1  RDW 13.2 13.2  PLT 165 167   Thyroid   Recent Labs  Lab 12/17/23 1356  TSH 3.577    BNPNo results for input(s): "BNP", "PROBNP" in the last 168 hours.  DDimer No results for input(s): "DDIMER" in the last 168 hours.   Radiology    ECHOCARDIOGRAM COMPLETE Result Date: 12/18/2023    ECHOCARDIOGRAM REPORT   Patient Name:   KOLINA MEEUWSEN Date of Exam: 12/18/2023 Medical Rec #:  [id number]    Height:       67.0 in Accession #:    [id number]   Weight:       134.0 lb Date of Birth:  [birth year]     BSA:  1.706 m Patient Age:    89 years     BP:           154/78 mmHg Patient Gender: F            HR:           54 bpm. Exam Location:  Inpatient Procedure: 2D Echo, Color Doppler and Cardiac Doppler (Both Spectral and Color            Flow Doppler were utilized during procedure). Indications:    Syncope  History:        Patient has no prior history of Echocardiogram examinations.                 Signs/Symptoms:Syncope; Risk Factors:Hypertension.  Sonographer:    Janette Medley RDCS Referring Phys: Darline Eis HALEY IMPRESSIONS  1. Left ventricular ejection fraction, by estimation, is 65 to 70%. The left ventricle has normal function. The left ventricle has no regional wall motion abnormalities. Left ventricular diastolic parameters are indeterminate.  2. Right ventricular systolic function is normal. The right ventricular size is normal.  3. The mitral valve is normal in structure. Trivial mitral valve regurgitation. No evidence of mitral stenosis.  4. The aortic valve is normal in structure. Aortic valve regurgitation is not visualized. No aortic stenosis is present.  5. The inferior vena cava is normal in size with greater than 50% respiratory  variability, suggesting right atrial pressure of 3 mmHg. FINDINGS  Left Ventricle: Left ventricular ejection fraction, by estimation, is 65 to 70%. The left ventricle has normal function. The left ventricle has no regional wall motion abnormalities. The left ventricular internal cavity size was normal in size. There is  no left ventricular hypertrophy. Left ventricular diastolic parameters are indeterminate. Indeterminate filling pressures. Right Ventricle: The right ventricular size is normal. No increase in right ventricular wall thickness. Right ventricular systolic function is normal. Left Atrium: Left atrial size was normal in size. Right Atrium: Right atrial size was normal in size. Pericardium: There is no evidence of pericardial effusion. Mitral Valve: The mitral valve is normal in structure. Trivial mitral valve regurgitation. No evidence of mitral valve stenosis. Tricuspid Valve: The tricuspid valve is normal in structure. Tricuspid valve regurgitation is trivial. No evidence of tricuspid stenosis. Aortic Valve: The aortic valve is normal in structure. Aortic valve regurgitation is not visualized. No aortic stenosis is present. Aortic valve mean gradient measures 5.0 mmHg. Aortic valve peak gradient measures 8.3 mmHg. Aortic valve area, by VTI measures 2.04 cm. Pulmonic Valve: The pulmonic valve was normal in structure. Pulmonic valve regurgitation is trivial. No evidence of pulmonic stenosis. Aorta: The aortic root is normal in size and structure. Venous: The inferior vena cava is normal in size with greater than 50% respiratory variability, suggesting right atrial pressure of 3 mmHg. IAS/Shunts: No atrial level shunt detected by color flow Doppler.  LEFT VENTRICLE PLAX 2D LVIDd:         4.30 cm   Diastology LVIDs:         2.70 cm   LV e' medial:    7.07 cm/s LV PW:         0.90 cm   LV E/e' medial:  13.4 LV IVS:        0.90 cm   LV e' lateral:   9.79 cm/s LVOT diam:     2.00 cm   LV E/e' lateral: 9.7 LV  SV:         81 LV SV  Index:   48 LVOT Area:     3.14 cm  RIGHT VENTRICLE             IVC RV S prime:     13.20 cm/s  IVC diam: 2.40 cm TAPSE (M-mode): 2.6 cm LEFT ATRIUM             Index        RIGHT ATRIUM           Index LA diam:        3.40 cm 1.99 cm/m   RA Area:     10.00 cm LA Vol (A2C):   41.3 ml 24.21 ml/m  RA Volume:   19.40 ml  11.37 ml/m LA Vol (A4C):   37.8 ml 22.16 ml/m LA Biplane Vol: 42.6 ml 24.97 ml/m  AORTIC VALVE AV Area (Vmax):    2.03 cm AV Area (Vmean):   2.04 cm AV Area (VTI):     2.04 cm AV Vmax:           144.00 cm/s AV Vmean:          112.000 cm/s AV VTI:            0.397 m AV Peak Grad:      8.3 mmHg AV Mean Grad:      5.0 mmHg LVOT Vmax:         92.90 cm/s LVOT Vmean:        72.900 cm/s LVOT VTI:          0.258 m LVOT/AV VTI ratio: 0.65  AORTA Ao Root diam: 2.50 cm Ao Asc diam:  3.10 cm MITRAL VALVE               TRICUSPID VALVE MV Area (PHT): 2.26 cm    TR Peak grad:   20.2 mmHg MV Decel Time: 336 msec    TR Vmax:        225.00 cm/s MV E velocity: 95.00 cm/s MV A velocity: 85.00 cm/s  SHUNTS MV E/A ratio:  1.12        Systemic VTI:  0.26 m                            Systemic Diam: 2.00 cm Maudine Sos MD Electronically signed by Maudine Sos MD Signature Date/Time: 12/18/2023/3:27:49 PM    Final    CT Head Wo Contrast Result Date: 12/17/2023 CLINICAL DATA:  Altered mental status. EXAM: CT HEAD WITHOUT CONTRAST TECHNIQUE: Contiguous axial images were obtained from the base of the skull through the vertex without intravenous contrast. RADIATION DOSE REDUCTION: This exam was performed according to the departmental dose-optimization program which includes automated exposure control, adjustment of the mA and/or kV according to patient size and/or use of iterative reconstruction technique. COMPARISON:  March 24, 2022 FINDINGS: Brain: There is generalized cerebral atrophy with widening of the extra-axial spaces and ventricular dilatation. There are areas of decreased  attenuation within the white matter tracts of the supratentorial brain, consistent with microvascular disease changes. Vascular: Moderate severity bilateral cavernous carotid artery calcification is noted. Skull: Normal. Negative for fracture or focal lesion. Sinuses/Orbits: A 9 mm right maxillary sinus polyp versus mucous retention cyst is noted. Other: A stable partially calcified scalp soft tissue nodules are seen. IMPRESSION: 1. Generalized cerebral atrophy with chronic white matter small vessel ischemic changes. 2. No acute intracranial abnormality. Electronically Signed   By: Virgle Grime M.D.   On: 12/17/2023 16:21  DG Chest Port 1 View Result Date: 12/17/2023 CLINICAL DATA:  Syncope. EXAM: PORTABLE CHEST 1 VIEW COMPARISON:  April 09, 2021. FINDINGS: The heart size and mediastinal contours are within normal limits. Both lungs are clear. The visualized skeletal structures are unremarkable. IMPRESSION: No active disease. Electronically Signed   By: Rosalene Colon M.D.   On: 12/17/2023 14:16    Cardiac Studies   Echo   12/18/23  1. Left ventricular ejection fraction, by estimation, is 65 to 70%. The  left ventricle has normal function. The left ventricle has no regional  wall motion abnormalities. Left ventricular diastolic parameters are  indeterminate.   2. Right ventricular systolic function is normal. The right ventricular  size is normal.   3. The mitral valve is normal in structure. Trivial mitral valve  regurgitation. No evidence of mitral stenosis.   4. The aortic valve is normal in structure. Aortic valve regurgitation is  not visualized. No aortic stenosis is present.   5. The inferior vena cava is normal in size with greater than 50%  respiratory variability, suggesting right atrial pressure of 3 mmHg.    Patient Profile      AQUASIA SHEFFEY is a 89 y.o. female with a hx of Parkinson's, hypothyroidism with thyroidectomy, hypertension who is being seen 12/17/2023 for the  evaluation of syncope at the request of ED.   Assessment & Plan     Syncope   Pt admitted after near syncopal spell    Per patients husband she has had many spells over the past year, particularly if getting out of a  hot shower (has seat in shower  Since admit her meds have been adjusted      Carvedilol  held   Amlodipine  added  Orthostatics yesterday afternoon negative   I would  let BP run a little higher given her falls,syncope hx     Overall I think orthostatic in nature    She has had no severe bradycardia here     I would avoid meds like hydralazine  which can cause abrupt drops in BP   If needed can give additional amlodipine  2.5 or 5      Abdominal binder, support socks     Will sign off     I will make sure that pt has some follow up after with monitor and also clnic   For questions or updates, please contact [facility] Please consult [email or web address] for contact info under        Signed, Ola Berger, MD  12/19/2023, 7:06 AM

## 2024-01-01 NOTE — Progress Notes (Signed)
 Cardiology Office Note:    Date:  01/12/2024   ID:  Regina [name], DOB 11-12-1932, MRN [id number]  PCP:  Edda Goo, MD  Cardiologist:  Ola Berger, MD     Referring MD: Edda Goo, MD   Chief Complaint: hospital follow-up of syncope  History of Present Illness:    Regina [name] is a 89 y.o. female with a history of recurrent syncope, hypertension complicated by orthostatic hypotension, CKD stage III, thyroidectomy with subsequent hypothyroidism, GERD with Barrett's esophagus, and Parkinson's disease who is followed by Dr. Avanell Bob and presents today for hospital follow-up of syncope.  Patient was seen by Dr. Santiago Cuff in 08/2014 after a hospitalization in 07/2014 for syncope that occurred in the setting a GI bleed. Colonoscopy at that time showed hemorrhoid but was otherwise unremarkable. Echo showed LVEF of 55-60% with mild LVH and grade 1 diastolic dysfunction but no significant valvular disease. Syncopal episode was felt to be secondary to acute GI blood loss and vasovagal reaction. She was not seen by Cardiology again until recent hospitalization.   She was recently admitted from 12/17/2023 to 12/19/2023 after another syncopal episode. Patient reportedly went to the bathroom after eating breakfast to wash out her mouth when husband went to go check on her and found her bent over holding herself up on the door and the sink. Husband reported she became very lethargic and he thinks she lost consciousness but patient reports being able to hear him the whole time. EMS was called and patient was reportedly bradycardic with rates in the 40s and hypotensive with  BP of 40/20. Transcutaneous pacing was attempted but EMS was unable to obtain electrical capture despite increasing MA; therefore, this was stopped. She was given IV fluids and a dose of Atropine and became more arousable. Family also mentioned that she was hypothermic with a temperature of 80 degrees but I don't see this mentioned in the EMS run  sheet. She was slightly hypothermic on arrival to the ED with temp of 95.1. Patient also reportedly had another syncopal episode about 1 year prior that occurred while she was walking outside and described multiple syncopal episodes in the past while taking a hot shower. Work-up in the ED was unremarkable. Echo showed LVEF of 65-70% with normal wall motion, normal RV function, and no significant valvular disease. She was noted to be orthostatic in the ED. Home Coreg  was stopped and live Zio monitor was placed at discharge. She was discharged to a SNF given generalized weakness.   Patient presents today for follow-up.  Here with her husband.  She was discharged back home from SNF on 01/02/2024.  She is very frail 64 year old.  Her husband assists with the history.  She reports feeling weak all the time and "hurting all over" especially in her back, hips, and neck.  She denies any chest pain though.  She does report some shortness of breath when walking longer distances such as when walking into our office today from the parking deck.  However, husband notes she does not typically have any significant shortness of breath when walking shorter distances.  No shortness of breath at rest.  No issues breathing at night including orthopnea.  No palpitations or significant lightheadedness/dizziness. She chronic mild lower extremity edema with the left leg been a little larger than the right; however, patient/ husband states this has been the case for years. Edema stable.  She has not had any recurrent syncopal episodes since being discharged from the hospital.  Husband is monitoring her BP at home.  He states systolic BP is typically in the 140s to 160s first thing in the morning but drops after she takes her medications.  Her diastolic BP is low today at 44.  Husband states this is typically in the 62s.  She mailed the Zio monitor back on 01/02/2024 but results are still pending.  EKGs/Labs/Other Studies Reviewed:     The following studies were reviewed today:  Echocardiogram 12/18/2023: Impressions: 1. Left ventricular ejection fraction, by estimation, is 65 to 70%. The  left ventricle has normal function. The left ventricle has no regional  wall motion abnormalities. Left ventricular diastolic parameters are  indeterminate.   2. Right ventricular systolic function is normal. The right ventricular  size is normal.   3. The mitral valve is normal in structure. Trivial mitral valve  regurgitation. No evidence of mitral stenosis.   4. The aortic valve is normal in structure. Aortic valve regurgitation is  not visualized. No aortic stenosis is present.   5. The inferior vena cava is normal in size with greater than 50%  respiratory variability, suggesting right atrial pressure of 3 mmHg.  _______________  EKG:  EKG not ordered today.   Recent Labs: 12/03/2023: ALT <5; Pro Brain Natriuretic Peptide 774.0 12/17/2023: TSH 3.577 12/18/2023: BUN 22; Creatinine, Ser 1.25; Hemoglobin 10.2; Platelets 167; Potassium 3.7; Sodium 138  Recent Lipid Panel No results found for: "CHOL", "TRIG", "HDL", "CHOLHDL", "VLDL", "LDLCALC", "LDLDIRECT"  Physical Exam:    Vital Signs: BP (!) 124/44 (BP Location: Right Arm, Patient Position: Sitting, Cuff Size: Normal)   Pulse 60   Ht 5\' 6"  (1.676 m)   Wt 136 lb 3.2 oz (61.8 kg)   SpO2 97%   BMI 21.98 kg/m     Wt Readings from Last 3 Encounters:  01/12/24 136 lb 3.2 oz (61.8 kg)  12/17/23 134 lb (60.8 kg)  12/03/23 134 lb 11.2 oz (61.1 kg)     General: 89 y.o. frail Caucasian female in no acute distress. HEENT: Normocephalic and atraumatic. Sclera clear.  Neck: Supple. No carotid bruits. No JVD. Heart: RRR. No murmurs, gallops, or rubs.  Lungs: No increased work of breathing. Clear to ausculation bilaterally. No wheezes, rhonchi, or rales.  Extremities: Mild lower extremity edema (left chronically larger than right). Skin: Warm and dry. Neuro: No focal  deficits.  Assessment:    1. Recurrent syncope   2. Primary hypertension   3. Orthostatic hypotension   4. Stage 3 chronic kidney disease, unspecified whether stage 3a or 3b CKD (HCC)     Plan:    Recurrent Syncope Patient has a history of recurrent syncope over the years mostly when getting out of a hot shower. He was recently admitted in early 12/2023 for syncope. She was noted to be bradycardic and hypotensive in the field upon EMS arrival. Transcutaneous pacing was attempted but EMS was unable to obtain electrical capture despite increasing MA; therefore, this was stopped. She was treated with IV fluids and Atropine with improvement. In the ED, she was orthostatic. Echo showed normal LV function with no significant valvular disease. Home Coreg  was stopped. Live Zio monitor was placed at discharged but results are still pending. - No recurrent syncope.  - Will wait for formal monitor results.   Hypertension Orthostatic Hypotension Patient has a history of hypertension but was noted to be orthostatic during recent admission.  - BP 124/44 in the office today.  Husband states systolic Bps are usually in the  140s to 160s first thing in the morning and then will drop after she takes her medication.  Diastolic BP reportedly usually in the 60s. - No recurrent syncope or orthostatic symptoms.  However, she does describe feeling weak all the time and "hurting all over." - Continue current medications: Amlodipine  5mg  daily and Valsartan 40mg  daily.  - Recommended staying well hydrated, changing positions slowly, avoiding super hot showers/ baths, and wearing compression stockings to help with orthostatic symptoms/ syncope.  - Asked patient/ husband to keep a log of BP/HR for 1 week and then send this to us .   CKD Stage III Baseline creatinine around 1.0 to 1.3.   Disposition: Follow up in 3-4 months.   Signed, Casimer Clear, PA-C  01/12/2024 12:03 PM    [facility]

## 2024-01-12 ENCOUNTER — Encounter: Payer: Self-pay | Admitting: Student

## 2024-01-12 ENCOUNTER — Ambulatory Visit: Attending: Student | Admitting: Student

## 2024-01-12 VITALS — BP 124/44 | HR 60 | Ht 66.0 in | Wt 136.2 lb

## 2024-01-12 DIAGNOSIS — R55 Syncope and collapse: Secondary | ICD-10-CM | POA: Diagnosis not present

## 2024-01-12 DIAGNOSIS — I951 Orthostatic hypotension: Secondary | ICD-10-CM | POA: Diagnosis present

## 2024-01-12 DIAGNOSIS — I10 Essential (primary) hypertension: Secondary | ICD-10-CM | POA: Diagnosis present

## 2024-01-12 DIAGNOSIS — N183 Chronic kidney disease, stage 3 unspecified: Secondary | ICD-10-CM | POA: Insufficient documentation

## 2024-01-12 NOTE — Patient Instructions (Addendum)
 Medication Instructions:  Your physician recommends that you continue on your current medications as directed. Please refer to the Current Medication list given to you today.  *If you need a refill on your cardiac medications before your next appointment, please call your pharmacy*  Lab Work: NONE If you have labs (blood work) drawn today and your tests are completely normal, you will receive your results only by: MyChart Message (if you have MyChart) OR A paper copy in the mail If you have any lab test that is abnormal or we need to change your treatment, we will call you to review the results.  Testing/Procedures: NONE  Follow-Up: At [facility], you and your health needs are our priority.  As part of our continuing mission to provide you with exceptional heart care, our providers are all part of one team.  This team includes your primary Cardiologist (physician) and Advanced Practice Providers or APPs (Physician Assistants and Nurse Practitioners) who all work together to provide you with the care you need, when you need it.  Your next appointment:   3-4 months  Provider:   Avanell Bob, MD or Winona Haw  We recommend signing up for the patient portal called "MyChart".  Sign up information is provided on this After Visit Summary.  MyChart is used to connect with patients for Virtual Visits (Telemedicine).  Patients are able to view lab/test results, encounter notes, upcoming appointments, etc.  Non-urgent messages can be sent to your provider as well.   To learn more about what you can do with MyChart, go to [email or web address].   Other Instructions Please take a log of your blood pressure/ heart rate for 1 week. Take BP first thing in the morning and then 2 hours afterward. Please return this to us  after 1 week. You can send us  a MyChart message, mail this to us , or just call our office and read off the numbers.  Recommendations for orthostatic hypotension (drop in blood  pressure with position changes): - Change positions slowly - Stay well hydrated - Avoid hot showers/ baths - Wear compressions stockings if able

## 2024-01-14 NOTE — Addendum Note (Signed)
 Encounter addended by: Jama Krichbaum N on: 01/14/2024 9:55 AM  Actions taken: Imaging Exam ended

## 2024-01-15 DIAGNOSIS — R55 Syncope and collapse: Secondary | ICD-10-CM | POA: Diagnosis not present

## 2024-01-19 ENCOUNTER — Ambulatory Visit: Payer: Self-pay | Admitting: Student

## 2024-01-20 NOTE — Telephone Encounter (Signed)
 Husband Royston Cornea) returned RN's call regarding results.

## 2024-02-02 ENCOUNTER — Ambulatory Visit
Admission: RE | Admit: 2024-02-02 | Discharge: 2024-02-02 | Disposition: A | Discharge status: Home / Self Care | Source: Ambulatory Visit | Attending: Internal Medicine | Admitting: Internal Medicine

## 2024-02-02 ENCOUNTER — Other Ambulatory Visit: Payer: Self-pay | Admitting: Internal Medicine

## 2024-02-02 DIAGNOSIS — W19XXXA Unspecified fall, initial encounter: Secondary | ICD-10-CM

## 2024-02-03 ENCOUNTER — Ambulatory Visit: Payer: Medicare Other | Admitting: Neurology

## 2024-02-10 ENCOUNTER — Ambulatory Visit (INDEPENDENT_AMBULATORY_CARE_PROVIDER_SITE_OTHER): Admitting: Neurology

## 2024-02-10 ENCOUNTER — Encounter: Payer: Self-pay | Admitting: Neurology

## 2024-02-10 VITALS — BP 137/69 | HR 86 | Ht 67.0 in | Wt 138.0 lb

## 2024-02-10 DIAGNOSIS — R269 Unspecified abnormalities of gait and mobility: Secondary | ICD-10-CM | POA: Diagnosis not present

## 2024-02-10 DIAGNOSIS — G20C Parkinsonism, unspecified: Secondary | ICD-10-CM | POA: Diagnosis not present

## 2024-02-10 DIAGNOSIS — R413 Other amnesia: Secondary | ICD-10-CM

## 2024-02-10 NOTE — Progress Notes (Signed)
 Patient: Regina [name] Date of Birth: Mar 12, 1933  Reason for Visit: Follow up  History from: Patient, husband Primary Neurologist: Dr. Buck   ASSESSMENT AND PLAN 89 y.o. year old female   1.  Parkinsonism 2.  Memory loss, MMSE 24/30 3.  Syncope, bradycardia May 2025, hospital admission   -Regina [name] has had a decline in her mobility since hospitalization.  She mostly complains of arthritis pain all over.  Difficult to discern if she has true stiffness or rigidity as result of parkinsonism versus avoidance of movement due to pain.  Advised to try Tylenol  twice a day for arthritis pain.  Work diligently with physical therapy to strengthen her muscles and work on gait.  Drink plenty of water, consider compression stockings.  Continue follow-up with cardiology.  Cardiac monitor did not show any significant bradycardia or arrhythmia.  May consider increase in Sinemet  to 1 tablet 4 times a day if husband notes bradykinesia in the future.  Memory score is stable MMSE 24/30.  She will remain off Aricept  and Namenda  (Aricept  stopped due to bradycardia, unclear why Namenda  was stopped).  She will follow-up in 6 months with Dr. Buck to rotate every few visits with me. -DAT SCAN March 2023 showed marked decreased activity within the LEFT and RIGHT striatum is of pattern suggestive of Parkinson's syndrome pathology -Follow-up in 6 months or sooner if needed  HISTORY  10/16/21 Dr. Buck: Regina [name] is an 89 year old right-handed woman with an underlying medical history of allergies, reflux disease, hypertension, thyroid  disease, gait disorder and parkinsonism, who presents for follow-up consultation of her parkinsonism.  She is accompanied by her husband today and presents for a sooner than scheduled appointment after a recent DaT scan.  She has previously followed with Dr. Jenel and has also seen Greig Forbes, NP as well as Lauraine Born, NP.  She was most recently seen by Lauraine on 09/25/2021.  I reviewed the  note and also copy prior notes below for reference. She had a DaTscan  on 10/10/2021 and I reviewed the results: IMPRESSION: Marked decreased activity within the LEFT and RIGHT striatum is of pattern suggestive of Parkinson's syndrome pathology. Of note, DaTSCAN  is not diagnostic of Parkinsonian syndromes, which remains a clinical diagnosis. DaTscan  is an adjuvant test to aid in the clinical diagnosis of Parkinsonian syndromes.   Today, 10/16/2021: She reports feeling somewhat nervous.  She reports no pain.  Her husband provides most of her history.  She is also hard of hearing and does not have any hearing aids.  He reports that they have increased her Sinemet  from half a pill twice daily to now 1 pill 3 times a day, she seems to tolerate it and seems to walk a little bit better and have more stamina.  She typically sits in her reclining chair most of the day but is able to be more upright lately.  She recently started seeing a dermatologist for brittle skin and swelling in her legs as well as redness in her legs.  She fell in December.  She was using a cane at the time.  She lives with her husband.  They have a 29 year old adoptive son and they also have 2 other grown children.  She takes Sinemet  around 9 30-10 between 1 and 2 PM.  She eats at those times as well.  She hydrates fairly well, per husband, she drinks about 3 bottles of water per day on average.  She has had some home health therapy but this  finished.  02/14/22 SS: Her with husband, taking Sinemet  1 tablet 3 times daily (10 AM, 3 PM, 10 PM), husband hasn't seen any change, has noticed she is slowing down. Short term memory isn't as good. Husband thought initially some benefit, now he thinks she has slowed down. She changes the subject, loses her train of thought. Using walker, 1 fall in the yard, the rolling walker rolled away from her when she was standing. Sleeps well, eats good. Has low back pain from arthritis, won't take Tylenol , thinks it  makes her legs swell.   Update July 17, 2022 SS: MMSE 22/30 today. Mentions few times when taking shower in the AM, while standing will feel faint. Would pass out, if her husband wasn't there to sit her on the shower chair. In AM BP sometimes high 200 systolic, pcp added Valsartan, shower spells happen when BP is low. Remains on Sinemet  25/100 3 times daily 30 minutes before food, not sure has been any difference. Questions if this is causing erratic BP? Most morning doesn't get up until 11 AM. Memory is declining, needs help with all ADLs. Is hard to please her, grumpy. 1 fall in the summer after she passed out due to heat, had to go to ER. She sleeps well. Can focus on trash on the floor, carries multiple tissues around with her.  CT head August 2023 showed stable atrophy and white matter.  Update Dr. Buck 12/18/2022: She reports very little, her husband is not sure when and why she stopped the memantine .  As needed recalls, she may have had stomach problems with it or could not tolerate it or could not sleep well, she stopped it but unclear when exactly.  As he recalls, she was started on donepezil  in the hospital.  He does report that she has lower heart rate at home, but she has had high blood pressure values at times first thing in the morning.  He is not sure if the primary care may have started the donepezil  and may have recommended stopping the memantine .  She uses a walker outside the home but typically not inside the home.   Of note, she was hospitalized in February from 09/27/2022 through 09/30/2022 with colitis, deemed infectious.  She was treated with azithromycin  and Flagyl .  For some reason she was started on donepezil  at the time, 5 mg strength.  Update July 02, 2023 SS: Called in June reporting heart rate less than 50, Aricept  was held.  Doing about the same.  MMSE 24/30 today.  She continues to walk slow, uses walker.  No falls.  She is rather sedentary.  She complains a lot at home.   Likes her husband to be close, needs assistance with most ADLs.  Takes Sinemet  9, 4, 10.  She is up during the night with nocturia.  Has a good appetite, has to be careful with chewing.  Often sleeps during the day.  Tries to walk outside, but husband has a hard time getting her to leave the house.  Remains off Namenda .  Update February 10, 2024 SS: Admitted in May 2025 for episode of syncope, AMS reported heart rate in the 40s, systolic in the 40s.  They cannot capture transcutaneous pacing.  Given 1 mg of atropine, hypothermic 95.1, heart rate in the 50s upon arrival.  Positive orthostatic.  Cardiac monitor did not show any concerning arrhythmias or significant bradycardia. Here with her husband, reports her balance, memory is worse. Has an aide that comes daily. Remains  on Sinemet  25/100 mg TID. Getting PT but doesn't have the energy to do much. Had a fall last week, stood without assistance trying to pick something up off the floor. Complains of hurting all over due to arthritis. Did 2 weeks in [facility].   REVIEW OF SYSTEMS: Out of a complete 14 system review of symptoms, the patient complains only of the following symptoms, and all other reviewed systems are negative.  See HPI  ALLERGIES: Allergies  Allergen Reactions   Tape Other (See Comments)    TAPE PULLS OFF THE SKIN, SO PLEASE USE AN ALTERNATIVE   Penicillins Hives and Itching   Latex Rash and Other (See Comments)    NO Band-Aids!! Pulls off skin     HOME MEDICATIONS: Outpatient Medications Prior to Visit  Medication Sig Dispense Refill   amLODipine  (NORVASC ) 5 MG tablet Take 1 tablet (5 mg total) by mouth at bedtime. 30 tablet 0   calcitRIOL  (ROCALTROL ) 0.25 MCG capsule Take 0.25 mcg by mouth daily.     carbidopa -levodopa  (SINEMET ) 25-100 MG tablet Take 1 tablet by mouth 3 (three) times daily. 270 tablet 3   GEMTESA 75 MG TABS Take 1 tablet by mouth daily.     SYNTHROID  75 MCG tablet Take 75 mcg by mouth daily before breakfast.      SYSTANE ULTRA PF 0.4-0.3 % SOLN Place 1 drop into both eyes in the morning, at noon, and at bedtime.     valsartan (DIOVAN) 40 MG tablet Take 40 mg by mouth daily.     No facility-administered medications prior to visit.    PAST MEDICAL HISTORY: Past Medical History:  Diagnosis Date   Allergy    Barrett esophagus 08/12/2008   Cancer (HCC)    GERD (gastroesophageal reflux disease)    Hypertension    Thyroid  disease    UTI (urinary tract infection)     PAST SURGICAL HISTORY: Past Surgical History:  Procedure Laterality Date   APPENDECTOMY     COLON SURGERY     COLONOSCOPY     COLONOSCOPY N/A 08/03/2014   Procedure: COLONOSCOPY;  Surgeon: Lamar JONETTA Aho, MD;  Location: [facility] END[facility]COPY;  Service: Endoscopy;  Laterality: N/A;   POLYPECTOMY     TONSILLECTOMY     TOTAL THYROIDECTOMY     TUBAL LIGATION      FAMILY HISTORY: Family History  Problem Relation Age of Onset   Heart disease Mother    Heart disease Father    Diabetes Sister    COPD Brother    Diabetes Brother    Parkinson's disease Neg Hx     SOCIAL HISTORY: Social History   Socioeconomic History   Marital status: Married    Spouse name: Lynwood   Number of children: Not on file   Years of education: Not on file   Highest education level: Not on file  Occupational History   Not on file  Tobacco Use   Smoking status: Never   Smokeless tobacco: Never  Substance and Sexual Activity   Alcohol  use: No   Drug use: No   Sexual activity: Not Currently  Other Topics Concern   Not on file  Social History Narrative   Lives with husband and son   Right Handed   Drinks 1-2 cups caffeine daily   Social Drivers of Health   Financial Resource Strain: Not on file  Food Insecurity: No Food Insecurity (12/17/2023)   Hunger Vital Sign    Worried About Running Out of Food in the  Last Year: Never true    Ran Out of Food in the Last Year: Never true  Transportation Needs: No Transportation Needs (12/17/2023)   PRAPARE  - Administrator, Civil Service (Medical): No    Lack of Transportation (Non-Medical): No  Physical Activity: Not on file  Stress: Not on file  Social Connections: Socially Integrated (12/17/2023)   Social Connection and Isolation Panel    Frequency of Communication with Friends and Family: Twice a week    Frequency of Social Gatherings with Friends and Family: Once a week    Attends Religious Services: 1 to 4 times per year    Active Member of [employer] or Organizations: No    Attends Banker Meetings: 1 to 4 times per year    Marital Status: Married  Catering manager Violence: Not At Risk (12/17/2023)   Humiliation, Afraid, Rape, and Kick questionnaire    Fear of Current or Ex-Partner: No    Emotionally Abused: No    Physically Abused: No    Sexually Abused: No   PHYSICAL EXAM  Vitals:   02/10/24 1455  BP: 137/69  Pulse: 86  Weight: 138 lb (62.6 kg)  Height: 5' 7 (1.702 m)     Body mass index is 21.61 kg/m.    02/10/2024    3:18 PM 07/02/2023   11:08 AM 07/17/2022   12:49 PM  MMSE - Mini Mental State Exam  Orientation to time 5 5 3   Orientation to Place 5 5 5   Registration 3 3 3   Attention/ Calculation 1 0 1  Recall 2 3 2   Language- name 2 objects 2 2 2   Language- repeat 1 1 1   Language- follow 3 step command 3 3 3   Language- read & follow direction 1 1 1   Write a sentence 0 1 1  Copy design 1 0 0  Total score 24 24 22    Generalized: Well developed, in no acute distress, mild to moderate masking of the face, flat affect, displeased look, mostly sits with eyes closed  Neurological examination  Mentation: Alert, oriented, husband provides the history,  follows exam commands well, she seems irritated  Cranial nerve II-XII: Pupils were equal round reactive to light. Extraocular movements were full, visual field were full on confrontational test. Facial sensation and strength were normal. Head turning and shoulder shrug  were normal and  symmetric. Motor: Good strength all extremities, but due to pain is hesitant to move decreased finger taps mildly bilaterally, no significant rigidity was noted Sensory: Sensory testing is intact to soft touch on all 4 extremities. No evidence of extinction is noted.  She is sensitive to touch all over, left arm is wrapped in Kerlex.  Coordination: Cerebellar testing reveals good finger-nose-finger and heel-to-shin bilaterally, movements are mildly slowed.  No tremor noted. Gait and station:  gait is wide-based, stooped posture, uses walker, no freezing noted, stride is slow good length  DIAGNOSTIC DATA (LABS, IMAGING, TESTING) - I reviewed patient records, labs, notes, testing and imaging myself where available.  Lab Results  Component Value Date   WBC 9.1 12/18/2023   HGB 10.2 (L) 12/18/2023   HCT 29.9 (L) 12/18/2023   MCV 98.0 12/18/2023   PLT 167 12/18/2023      Component Value Date/Time   NA 138 12/18/2023 0623   NA 137 03/18/2018 1048   K 3.7 12/18/2023 0623   CL 108 12/18/2023 0623   CO2 22 12/18/2023 0623   GLUCOSE 80 12/18/2023 9376  BUN 22 12/18/2023 0623   BUN 20 03/18/2018 1048   CREATININE 1.25 (H) 12/18/2023 0623   CALCIUM 8.7 (L) 12/18/2023 0623   PROT 6.4 (L) 12/03/2023 1607   PROT 5.8 (L) 03/18/2018 1048   ALBUMIN 3.7 12/03/2023 1607   ALBUMIN 3.4 (L) 03/18/2018 1048   AST 17 12/03/2023 1607   ALT <5 12/03/2023 1607   ALKPHOS 60 12/03/2023 1607   BILITOT 0.5 12/03/2023 1607   BILITOT 0.3 03/18/2018 1048   GFRNONAA 41 (L) 12/18/2023 0623   GFRAA 49 (L) 04/22/2020 1342   No results found for: CHOL, HDL, LDLCALC, LDLDIRECT, TRIG, CHOLHDL No results found for: YHAJ8R Lab Results  Component Value Date   VITAMINB12 422 07/20/2020   Lab Results  Component Value Date   TSH 3.577 12/17/2023   Lauraine Born, AGNP-C, DNP 02/10/2024, 4:18 PM [facility] [address], [address] [place], [place] [address] [phone]

## 2024-02-10 NOTE — Patient Instructions (Signed)
 Try taking Tylenol  twice a day for arthritis pain.  Drink plenty of water.  Stand slowly.  Continue Sinemet .  Try to work with physical therapy.  Call for any worsening symptoms.  Follow-up in 6 months.  Thanks!!

## 2024-02-10 NOTE — Progress Notes (Signed)
 I reviewed the above note and documentation by the Nurse Practitioner and agree with the history, exam, assessment and plan as outlined above. I was available for consultation. Debbra Fairy, MD, PhD [facility])

## 2024-04-15 NOTE — Progress Notes (Unsigned)
 Cardiology Office Note   Date:  04/16/2024   ID:  Regina [name], DOB Jan 18, 1933, MRN [id number]  PCP:  Valma Carwin, MD  Cardiologist:   Vina Gull, MD   Patient presents for follow up of blood pressure     History of Present Illness: Regina [name] is a 89 y.o. female with a history of HTN, orthostatic hypotension, recurrent syncope, CKD GERD with Barretts esophagus and Parkinson's dz      2015  Hospitalized for syncope  Occurred in setting of GI bleed  Colonoscopy showed hemorrhoid   Syncope felt to be vasovagal 2015 Echo LVEF 55 to 60%  Mild LVH     By report had had syncope with hot shower, walking outside in 2024   May 2025  [name] for presyncope  Standing at sink  Could hear husband  EMS HR 40s BP 40/20  TCP attempted without success  Given IV fluids and atropine  T 95  Echo done LVEF 65 to 70%   Normal RVEF   Orthostatic   Coreg  stopped   Zio monitor placed    This showed SR  49 to 129 bpm  Average HR 67 bpm  Rare PVCs, PACs   Short bursts of SVT   Self limited, longest 18 beats   Seen by JAYSON Door in June 2025  Complained of feeling weak and hurting all over   BP 140s to 160s at home   BP dropped after taking meds Monitor ordered  It  showed SR  Avg  HR 67 bpm  Brief bursts SVT, longest 18 beats at 115 bpm    The pt presents for folllow up with her husband.   She does not say much  BP readings from home   AM before meds  quite high at times  Range 140s t o 200  Mostely in 170/180 range  In PM readings 130s to 180  Mostly 140s to 150s  The pt has had no syncopal spells   Does have some dizziness with standing       Current Meds  Medication Sig   acetaminophen  (TYLENOL ) 650 MG CR tablet Take 650 mg by mouth in the morning and at bedtime.   calcitRIOL  (ROCALTROL ) 0.25 MCG capsule Take 0.25 mcg by mouth daily.   carbidopa -levodopa  (SINEMET ) 25-100 MG tablet Take 1 tablet by mouth 3 (three) times daily.   SYNTHROID  75 MCG tablet Take 75 mcg by mouth daily before breakfast.    SYSTANE ULTRA PF 0.4-0.3 % SOLN Place 1 drop into both eyes in the morning, at noon, and at bedtime.   valsartan (DIOVAN) 80 MG tablet Take 80 mg by mouth daily.     Allergies:   Tape, Penicillins, and Latex   Past Medical History:  Diagnosis Date   Allergy    Barrett esophagus 08/12/2008   Cancer (HCC)    GERD (gastroesophageal reflux disease)    Hypertension    Thyroid  disease    UTI (urinary tract infection)     Past Surgical History:  Procedure Laterality Date   APPENDECTOMY     COLON SURGERY     COLONOSCOPY     COLONOSCOPY N/A 08/03/2014   Procedure: COLONOSCOPY;  Surgeon: Lamar JONETTA Aho, MD;  Location: [facility] ENDOSCOPY;  Service: Endoscopy;  Laterality: N/A;   POLYPECTOMY     TONSILLECTOMY     TOTAL THYROIDECTOMY     TUBAL LIGATION       Social History:  The patient  reports that  she has never smoked. She has never used smokeless tobacco. She reports that she does not drink alcohol  and does not use drugs.   Family History:  The patient's family history includes COPD in her brother; Diabetes in her brother and sister; Heart disease in her father and mother.    ROS:  Please see the history of present illness. All other systems are reviewed and  Negative to the above problem except as noted.    PHYSICAL EXAM: VS:  BP (!) 130/52   Pulse 74   Ht 5' 7 (1.702 m)   Wt 141 lb 3.2 oz (64 kg)   SpO2 98%   BMI 22.12 kg/m   Laying 145/63  P 66     sitting   124/65  P 67   Standing   109/64  P 79 GEN: Well nourished, well developed, in no acute distress  HEENT: normal  Neck: JVP is normal  Cardiac: RRR; No murmurs  Respiratory:  clear to auscultation GI: soft, nontender,  Ext  No LE edema    EKG:  EKG is not ordered today.   Lipid Panel No results found for: CHOL, TRIG, HDL, CHOLHDL, VLDL, LDLCALC, LDLDIRECT    Wt Readings from Last 3 Encounters:  04/16/24 141 lb 3.2 oz (64 kg)  02/10/24 138 lb (62.6 kg)  01/12/24 136 lb 3.2 oz (61.8 kg)       ASSESSMENT AND PLAN:  1  HTN  BP is very labile  With standing BP drops 40 points without much change in HR  Given drop I would not push BP meds any further      2 Orthostatic hypotension.  Pt with Parkinsons which is probably contrib to problem   Discussed use of spanx, abdominal binders     Avoid hot showers   Husband says she likes them     Current medicines are reviewed at length with the patient today.  The patient does not have concerns regarding medicines.  Signed, Vina Gull, MD  04/16/2024 7:11 PM    [facility] [address] [place], [place], [place]  [address] Phone: [phone]; Fax: [phone]

## 2024-04-16 ENCOUNTER — Encounter: Payer: Self-pay | Admitting: Internal Medicine

## 2024-04-16 ENCOUNTER — Ambulatory Visit: Attending: Internal Medicine | Admitting: Internal Medicine

## 2024-04-16 VITALS — BP 130/52 | HR 74 | Ht 67.0 in | Wt 141.2 lb

## 2024-04-16 DIAGNOSIS — R55 Syncope and collapse: Secondary | ICD-10-CM | POA: Diagnosis present

## 2024-04-16 NOTE — Patient Instructions (Signed)
 Medication Instructions:  Your physician recommends that you continue on your current medications as directed. Please refer to the Current Medication list given to you today.  *If you need a refill on your cardiac medications before your next appointment, please call your pharmacy*   Follow-Up: At [facility], you and your health needs are our priority.  As part of our continuing mission to provide you with exceptional heart care, our providers are all part of one team.  This team includes your primary Cardiologist (physician) and Advanced Practice Providers or APPs (Physician Assistants and Nurse Practitioners) who all work together to provide you with the care you need, when you need it.  Your next appointment:   6 month(s)  Provider:   Vina Gull, MD

## 2024-08-16 ENCOUNTER — Ambulatory Visit: Admitting: Neurology

## 2024-08-16 ENCOUNTER — Encounter: Payer: Self-pay | Admitting: Neurology

## 2024-08-16 VITALS — BP 160/78 | HR 69 | Ht 66.0 in | Wt 147.0 lb

## 2024-08-16 DIAGNOSIS — R413 Other amnesia: Secondary | ICD-10-CM

## 2024-08-16 DIAGNOSIS — G20C Parkinsonism, unspecified: Secondary | ICD-10-CM

## 2024-08-16 DIAGNOSIS — R269 Unspecified abnormalities of gait and mobility: Secondary | ICD-10-CM | POA: Diagnosis not present

## 2024-08-16 NOTE — Progress Notes (Signed)
 Subjective:    Patient ID: Regina [name] is a 89 y.o. female.  HPI    Interim history:   Regina [name] is an 89 year old right-handed woman with an underlying medical history of allergies, reflux disease, hypertension, thyroid  disease, gait disorder history of syncope and bradycardia, and parkinsonism, who presents for follow-up consultation of her parkinsonism, associated with memory loss.  She is accompanied by her husband again today.  She was last seen in our clinic by Lauraine Born, NP in July 2025, at which time she had recently been discharged from rehab.  She was hospitalized in May 2025 after syncope with bradycardia.  Her MMSE was 24 out of 30 at the time, Aricept  had been stopped due to bradycardia and she was also off of memantine  since 2024.  Today, 08/16/2024: She reports that her memory is not good.  Her husband provides most of her history and reports that she still gets fainty when she is hot like in the shower.  They do have a shower seat.  About 6 weeks ago she almost fell in the bathroom but he was able to assist her onto the toilet commode and she did not fall to the ground.  She complains of widespread aching and pain and stiffness in she takes Tylenol  every day, 650 mg twice daily but it does not seem to help any longer.  She uses a 2 wheeled walker inside and outside.  She tries to hydrate well.  He estimates that she drinks about 3 bottles of water per day, 16.9 ounce size each.  Caffeine in the morning in the form of coffee, usually 1 cup.  The patient's allergies, current medications, family history, past medical history, past social history, past surgical history and problem list were reviewed and updated as appropriate.    Previously:   02/10/2024 (SS): <<Admitted in May 2025 for episode of syncope, AMS reported heart rate in the 40s, systolic in the 40s.  They cannot capture transcutaneous pacing.  Given 1 mg of atropine, hypothermic 95.1, heart rate in the 50s upon arrival.   Positive orthostatic.  Cardiac monitor did not show any concerning arrhythmias or significant bradycardia. Here with her husband, reports her balance, memory is worse. Has an aide that comes daily. Remains on Sinemet  25/100 mg TID. Getting PT but doesn't have the energy to do much. Had a fall last week, stood without assistance trying to pick something up off the floor. Complains of hurting all over due to arthritis. Did 2 weeks in [facility] re[facility]. >>  07/02/2023 (SS): <<Called in June reporting heart rate less than 50, Aricept  was held.  Doing about the same.  MMSE 24/30 today.  She continues to walk slow, uses walker.  No falls.  She is rather sedentary.  She complains a lot at home.  Likes her husband to be close, needs assistance with most ADLs.  Takes Sinemet  9, 4, 10.  She is up during the night with nocturia.  Has a good appetite, has to be careful with chewing.  Often sleeps during the day.  Tries to walk outside, but husband has a hard time getting her to leave the house.  Remains off Namenda .>>  12/18/2022: I first met her on 10/16/2021, at which time her husband provided most of her history.  She was on Sinemet  1 pill 3 times daily.  She had a fall in December 2022.  She was using a cane to ambulate.  She was advised to use a walker at all  times.  She was advised to continue with her Sinemet  1 pill 3 times daily.  Today, 12/18/2022: She reports very little, her husband is not sure when and why she stopped the memantine .  As needed recalls, she may have had stomach problems with it or could not tolerate it or could not sleep well, she stopped it but unclear when exactly.  As he recalls, she was started on donepezil  in the hospital.  He does report that she has lower heart rate at home, but she has had high blood pressure values at times first thing in the morning.  He is not sure if the primary care may have started the donepezil  and may have recommended stopping the memantine .  She uses a walker outside the  home but typically not inside the home.   Of note, she was hospitalized in February from 09/27/2022 through 09/30/2022 with colitis, deemed infectious.  She was treated with azithromycin  and Flagyl .  For some reason she was started on donepezil  at the time, 5 mg strength.   She saw Lauraine Born, NP on 02/14/2022, at which time her memory loss was discussed, her MMSE was 24 out of 30 at the time.  Due to low heart rate in the 50s she was not considered a good candidate for donepezil  and was started on low-dose Namenda  with gradual increase.   She saw Lauraine Born, NP on 07/17/2022, at which time her MMSE was 22 out of 30.  Referral to PT and OT was discussed but she had just finished a course of therapy.  She had sustained a heat related syncope in August 2023.  She was advised to continue with Namenda  10 mg twice daily.  Her husband called in the interim on 08/26/2022 reporting that patient had passed out while sitting in the chair.  They were advised to follow-up with primary care ASAP.       (She) presents for a sooner than scheduled appointment after a recent DaT scan.  She has previously followed with Dr. Jenel and has also seen Greig Forbes, NP as well as Lauraine Born, NP.  She was most recently seen by Lauraine on 09/25/2021.  I reviewed the note and also copy prior notes below for reference. She had a DaTscan  on 10/10/2021 and I reviewed the results: IMPRESSION: Marked decreased activity within the LEFT and RIGHT striatum is of pattern suggestive of Parkinson's syndrome pathology. Of note, DaTSCAN  is not diagnostic of Parkinsonian syndromes, which remains a clinical diagnosis. DaTscan  is an adjuvant test to aid in the clinical diagnosis of Parkinsonian syndromes.     <<09/25/21 SS: Regina [name] is here today with her husband for follow-up. Memory issues since suspected viral meningitis in summer 2019. MMSE 17/30. Memory has declined, isn't able to do much for herself. Needs help with ADLs. Needs helping using the  bathroom. 1 fall after tripping over cane, skinned her arm. Using walker. Takes 1/2 tablet twice daily of Sinemet , hasn't noted much change with use. Had bad hips that make it hard for her to move around. She is in PT/OT now, not any change. Is hard of hearing. Her mood can be poor at times. Sleeps well. Can be obsessed with trash on the floor. In PT/OT not seeing any benefit. >>    <<09/25/21 ALL: Regina [name] is a 89 y.o. female here today for follow up for memory disturbance and gait disorder concerning for parkinsonism. She presents with her husband who aids in history. Lauraine started her on Sinemet   25-100 1/2 tablet twice daily at last follow up 12/2020. Mr Leavy does not feel that it has made any significant difference in her movements. He does feel that hand tremor has improved. She seems to be tolerating medication well.    She was hospitalized for a UTI on 8/29. She was discharged to [facility] 9/1 and was discharged on 9/15. She started PT/OT last week. She has a [employer] coming to check on her weekly. She has not had any falls since last being seen. She reports significant hip pain, bilaterally. Her husband feels this is a significant contributor to her not being active. She refuses to take pain medication for pain. Mr Losh reports that she is sleeping more than normal. She has a good appetite. She denies depression. >>   <<12/28/20 SS: Regina [name] is an 89 year old female with history of memory issues.  MRI of the brain in December 2021 showed atrophy affecting temporal lobes could be consistent with memory disorder.  Mild small vessel disease, no change from 2019.  She has some Parkinson features, tried 1/2 of Sinemet  resulted in dizziness, she stopped it. Memory isn't all that bad, gets confused about things. Is scared to take a shower by herself, has no confidence. No fall since last seen. They adopted their 85 year grandson, has handicap issues. She worries about everything. Is not active, mostly sit around. She  does her own laundry. Sleeps well, good appetite. Moves slow, has muscle soreness all over.  Here today for evaluation accompanied by her husband.>>   07/20/2020( Dr. Jenel): <<Regina [name] is an 89 year old right-handed white female with a history of some memory issues that occurred since she had a presumed viral meningitis in the summer 2019.  The patient returns the office today.  She is sleeping fairly well, there is some report from her husband that her confusion seems to be getting slowly worse over time.  The patient is no longer cooking, she will do housework, she does not operate a motor vehicle.  She needs assistance keeping up with medications and appointments.  She does report some cramping of her legs at nighttime.  She is hard of hearing and she reports that she is cold all the time.  She has leg swelling as well.  She returns to the office today for an evaluation.>>   Her Past Medical History Is Significant For: Past Medical History:  Diagnosis Date   Allergy    Barrett esophagus 08/12/2008   Cancer (HCC)    GERD (gastroesophageal reflux disease)    Hypertension    Thyroid  disease    UTI (urinary tract infection)     Her Past Surgical History Is Significant For: Past Surgical History:  Procedure Laterality Date   APPENDECTOMY     COLON SURGERY     COLONOSCOPY     COLONOSCOPY N/A 08/03/2014   Procedure: COLONOSCOPY;  Surgeon: Lamar JONETTA Aho, MD;  Location: [facility] ENDOSCOPY;  Service: Endoscopy;  Laterality: N/A;   POLYPECTOMY     TONSILLECTOMY     TOTAL THYROIDECTOMY     TUBAL LIGATION      Her Family History Is Significant For: Family History  Problem Relation Age of Onset   Heart disease Mother    Heart disease Father    Diabetes Sister    COPD Brother    Diabetes Brother    Parkinson's disease Neg Hx     Her Social History Is Significant For: Social History   Socioeconomic History   Marital status:  Married    Spouse name: Lynwood   Number of children: Not on  file   Years of education: Not on file   Highest education level: Not on file  Occupational History   Not on file  Tobacco Use   Smoking status: Never   Smokeless tobacco: Never  Vaping Use   Vaping status: Never Used  Substance and Sexual Activity   Alcohol  use: No   Drug use: No   Sexual activity: Not Currently  Other Topics Concern   Not on file  Social History Narrative   Lives with husband and son   Right Handed   Drinks 1-2 cups caffeine daily   Social Drivers of Health   Tobacco Use: Low Risk (08/16/2024)   Patient History    Smoking Tobacco Use: Never    Smokeless Tobacco Use: Never    Passive Exposure: Not on file  Financial Resource Strain: Not on file  Food Insecurity: No Food Insecurity (12/17/2023)   Hunger Vital Sign    Worried About Running Out of Food in the Last Year: Never true    Ran Out of Food in the Last Year: Never true  Transportation Needs: No Transportation Needs (12/17/2023)   PRAPARE - Administrator, Civil Service (Medical): No    Lack of Transportation (Non-Medical): No  Physical Activity: Not on file  Stress: Not on file  Social Connections: Socially Integrated (12/17/2023)   Social Connection and Isolation Panel    Frequency of Communication with Friends and Family: Twice a week    Frequency of Social Gatherings with Friends and Family: Once a week    Attends Religious Services: 1 to 4 times per year    Active Member of [employer] or Organizations: No    Attends Engineer, Structural: 1 to 4 times per year    Marital Status: Married  Depression (PHQ2-9): Not on file  Alcohol  Screen: Not on file  Housing: Low Risk (12/17/2023)   Housing Stability Vital Sign    Unable to Pay for Housing in the Last Year: No    Number of Times Moved in the Last Year: 0    Homeless in the Last Year: No  Utilities: Not At Risk (12/17/2023)   [employer] Utilities    Threatened with loss of utilities: No  Health Literacy: Not on file    Her Allergies  Are:  Allergies[1]:   Her Current Medications Are:  Outpatient Encounter Medications as of 08/16/2024  Medication Sig   acetaminophen  (TYLENOL ) 650 MG CR tablet Take 650 mg by mouth in the morning and at bedtime.   calcitRIOL  (ROCALTROL ) 0.25 MCG capsule Take 0.25 mcg by mouth daily.   carbidopa -levodopa  (SINEMET ) 25-100 MG tablet Take 1 tablet by mouth 3 (three) times daily.   SYNTHROID  75 MCG tablet Take 75 mcg by mouth daily before breakfast.   SYSTANE ULTRA PF 0.4-0.3 % SOLN Place 1 drop into both eyes in the morning, at noon, and at bedtime.   valsartan (DIOVAN) 80 MG tablet Take 80 mg by mouth daily.   No facility-administered encounter medications on file as of 08/16/2024.  :  Review of Systems:  Out of a complete 14 point review of systems, all are reviewed and negative with the exception of these symptoms as listed below:  Review of Systems  Objective:  Neurological Exam  Physical Exam Physical Examination:   Vitals:   08/16/24 1326  BP: (!) 160/78  Pulse: 69    General Examination: The  patient is a very pleasant 89 y.o. female in no acute distress. She appears frail and deconditioned.  Minimally verbal.  Occasional grimacing noted.   HEENT: Normocephalic, atraumatic, pupils are equal, round and reactive to light, tracking is moderately impaired, corrective eyeglasses in place, hearing is mildly impaired, has bilateral aids.  Face is symmetric with mild facial masking, mild nuchal rigidity, no lip, neck or jaw tremor.  Airway examination reveals severe mouth dryness, small mouth opening.  No sialorrhea, speech with mild hypophonia, no significant dysarthria.     Chest: Clear to auscultation without wheezing, rhonchi or crackles noted.   Heart: S1+S2+0, regular and normal without murmurs, rubs or gallops noted.    Abdomen: Soft, non-tender and non-distended.   Extremities: There is 1+ pitting edema in the distal lower extremities bilaterally.    Skin: Warm and dry  with significant bruising and discoloration which appear to be chronic and quite severe in her upper extremities, also redness and patchy discoloration with hypo and hyperpigmentation in her distal lower extremities with significant swelling noted.  She had a biopsy taken from the left distal leg skin.     Musculoskeletal: exam reveals no obvious joint deformities, prominent arthritic changes in both hands.     Neurologically:  Mental status: The patient is awake, alert and pays attention but is unable to provide details of her history.  Her memory is impaired.  She is unable to provide her history fully, her husband provides most of her history. Cranial nerves II - XII are as described above under HEENT exam.     08/16/2024    1:33 PM 02/10/2024    3:18 PM 07/02/2023   11:08 AM 07/17/2022   12:49 PM 02/14/2022    1:29 PM 09/25/2021   11:25 AM 05/07/2021   10:41 AM  MMSE - Mini Mental State Exam  Orientation to time 2 5 5 3 4 1 4   Orientation to Place 3 5 5 5 5 4 5   Registration 3 3 3 3 3 3 3   Attention/ Calculation 1 1 0 1 1 1 5   Recall 2 2 3 2 3 2 3   Language- name 2 objects 2 2 2 2 2 2 1   Language- repeat 1 1 1 1 1  0 0  Language- follow 3 step command 3 3 3 3 3 2 2   Language- read & follow direction 1 1 1 1 1 1  0  Write a sentence 1 0 1 1 1 1  0  Copy design 0 1 0 0 0 0 0  Total score 19 24 24 22 24 17 23    On 08/16/2024: She was unable to draw clock for us  today.  Animal fluency test: 5/min.  Motor exam: Thin bulk, global strength of about 4 out of 5, no obvious resting tremor.  Fine motor skills with finger taps and foot taps are moderately impaired in the upper and lower extremities without obvious lateralization noted.    Cerebellar testing: No dysmetria or intention tremor. There is no truncal or gait ataxia.  Sensory exam: intact to light touch in the upper and lower extremities.  Gait, station and balance: She stands with difficulty and pushes herself up. She requires some  assistance.  Posture is moderately stooped.  She uses a 2 wheeled walker and has mild shuffling, no freezing, no obvious festination, mild difficulty with turns.  Balance is impaired.  She walks slowly.   Assessment and Plan:  In summary, Regina [name] is an 89 year old right-handed  woman with an underlying medical history of allergies, reflux disease, hypertension, thyroid  disease, gait disorder history of syncope and bradycardia, and parkinsonism, who presents for follow-up consultation of her parkinsonism, associated with memory loss.   She had a hospitalization in February 2024 for colitis.   Her Namenda  was stopped in 2024.  Her Aricept  was stopped after her syncopal spell and bradycardia.  She still has presyncopal symptoms.  She had a near fall some 6 weeks ago.  We talked about the challenges of advancing age, advancing arthritis, advancing parkinsonism and advancing memory loss.  Her memory scores have declined.  I would not recommend increasing her levodopa  at this time for fear of side effects including hallucinations, low blood pressure and dizziness.  We mutually agreed not to start her back on memory medications as she may have had side effects with the memantine  and she had issues with bradycardia while on donepezil .  We talked about the importance of supportive care, fall prevention, good hydration and nutrition again today.  She is advised to continue with levodopa  1 pill 3 times daily, try to stay well-hydrated and use her walker at all times. They are advised to stop the Tylenol  on a day-to-day basis as it can affect her liver function adversely and she is advised to discuss this with her primary care as well.   Per husband, the Tylenol  does not seem to help any longer.   She is advised to follow-up to see Lauraine Born, NP in about 6 months in this clinic routinely, sooner if needed.  I answered all their questions today and the patient and her husband were in agreement.  I spent 40 minutes  in total face-to-face time and in reviewing records during pre-charting, more than 50% of which was spent in counseling and coordination of care, reviewing test results, reviewing medications and treatment regimen and/or in discussing or reviewing the diagnosis of primary parkinsonism, memory loss, the prognosis and treatment options. Pertinent laboratory and imaging test results that were available during this visit with the patient were reviewed by me and considered in my medical decision making (see chart for details).       [1]  Allergies Allergen Reactions   Tape Other (See Comments)    TAPE PULLS OFF THE SKIN, SO PLEASE USE AN ALTERNATIVE   Penicillins Hives and Itching   Latex Rash and Other (See Comments)    NO Band-Aids!! Pulls off skin

## 2024-08-19 ENCOUNTER — Inpatient Hospital Stay (HOSPITAL_COMMUNITY)
Admission: EM | Admit: 2024-08-19 | Discharge: 2024-08-31 | DRG: 555 | Disposition: A | Discharge status: Skilled Nursing Facility | Attending: Internal Medicine | Admitting: Internal Medicine

## 2024-08-19 ENCOUNTER — Emergency Department (HOSPITAL_COMMUNITY)

## 2024-08-19 DIAGNOSIS — W19XXXA Unspecified fall, initial encounter: Principal | ICD-10-CM | POA: Diagnosis present

## 2024-08-19 DIAGNOSIS — F0280 Dementia in other diseases classified elsewhere without behavioral disturbance: Secondary | ICD-10-CM | POA: Diagnosis present

## 2024-08-19 DIAGNOSIS — R531 Weakness: Secondary | ICD-10-CM

## 2024-08-19 DIAGNOSIS — N179 Acute kidney failure, unspecified: Secondary | ICD-10-CM | POA: Diagnosis present

## 2024-08-19 DIAGNOSIS — J189 Pneumonia, unspecified organism: Secondary | ICD-10-CM | POA: Diagnosis not present

## 2024-08-19 DIAGNOSIS — I130 Hypertensive heart and chronic kidney disease with heart failure and stage 1 through stage 4 chronic kidney disease, or unspecified chronic kidney disease: Secondary | ICD-10-CM | POA: Diagnosis present

## 2024-08-19 DIAGNOSIS — E871 Hypo-osmolality and hyponatremia: Secondary | ICD-10-CM | POA: Diagnosis present

## 2024-08-19 DIAGNOSIS — Y9301 Activity, walking, marching and hiking: Secondary | ICD-10-CM | POA: Diagnosis present

## 2024-08-19 DIAGNOSIS — Z91040 Latex allergy status: Secondary | ICD-10-CM

## 2024-08-19 DIAGNOSIS — I5032 Chronic diastolic (congestive) heart failure: Secondary | ICD-10-CM | POA: Diagnosis present

## 2024-08-19 DIAGNOSIS — R262 Difficulty in walking, not elsewhere classified: Principal | ICD-10-CM | POA: Diagnosis present

## 2024-08-19 DIAGNOSIS — N390 Urinary tract infection, site not specified: Secondary | ICD-10-CM | POA: Diagnosis not present

## 2024-08-19 DIAGNOSIS — R0789 Other chest pain: Secondary | ICD-10-CM

## 2024-08-19 DIAGNOSIS — K5900 Constipation, unspecified: Secondary | ICD-10-CM | POA: Diagnosis present

## 2024-08-19 DIAGNOSIS — Z79890 Hormone replacement therapy (postmenopausal): Secondary | ICD-10-CM

## 2024-08-19 DIAGNOSIS — Z8249 Family history of ischemic heart disease and other diseases of the circulatory system: Secondary | ICD-10-CM

## 2024-08-19 DIAGNOSIS — E860 Dehydration: Secondary | ICD-10-CM | POA: Diagnosis present

## 2024-08-19 DIAGNOSIS — D539 Nutritional anemia, unspecified: Secondary | ICD-10-CM | POA: Diagnosis present

## 2024-08-19 DIAGNOSIS — G20C Parkinsonism, unspecified: Secondary | ICD-10-CM | POA: Diagnosis present

## 2024-08-19 DIAGNOSIS — G20A1 Parkinson's disease without dyskinesia, without mention of fluctuations: Secondary | ICD-10-CM | POA: Diagnosis present

## 2024-08-19 DIAGNOSIS — Z833 Family history of diabetes mellitus: Secondary | ICD-10-CM

## 2024-08-19 DIAGNOSIS — Z91048 Other nonmedicinal substance allergy status: Secondary | ICD-10-CM

## 2024-08-19 DIAGNOSIS — M25552 Pain in left hip: Secondary | ICD-10-CM | POA: Diagnosis present

## 2024-08-19 DIAGNOSIS — S2242XA Multiple fractures of ribs, left side, initial encounter for closed fracture: Secondary | ICD-10-CM | POA: Diagnosis present

## 2024-08-19 DIAGNOSIS — Z66 Do not resuscitate: Secondary | ICD-10-CM | POA: Diagnosis present

## 2024-08-19 DIAGNOSIS — Z825 Family history of asthma and other chronic lower respiratory diseases: Secondary | ICD-10-CM

## 2024-08-19 DIAGNOSIS — I4510 Unspecified right bundle-branch block: Secondary | ICD-10-CM | POA: Diagnosis present

## 2024-08-19 DIAGNOSIS — Z880 Allergy status to penicillin: Secondary | ICD-10-CM

## 2024-08-19 DIAGNOSIS — E890 Postprocedural hypothyroidism: Secondary | ICD-10-CM | POA: Diagnosis present

## 2024-08-19 DIAGNOSIS — R5383 Other fatigue: Secondary | ICD-10-CM | POA: Diagnosis present

## 2024-08-19 DIAGNOSIS — M549 Dorsalgia, unspecified: Secondary | ICD-10-CM | POA: Diagnosis not present

## 2024-08-19 DIAGNOSIS — Z515 Encounter for palliative care: Secondary | ICD-10-CM

## 2024-08-19 DIAGNOSIS — I10 Essential (primary) hypertension: Secondary | ICD-10-CM | POA: Diagnosis present

## 2024-08-19 DIAGNOSIS — N1831 Chronic kidney disease, stage 3a: Secondary | ICD-10-CM | POA: Diagnosis present

## 2024-08-19 DIAGNOSIS — A419 Sepsis, unspecified organism: Secondary | ICD-10-CM | POA: Diagnosis not present

## 2024-08-19 LAB — CBC
HCT: 32.8 % — ABNORMAL LOW (ref 36.0–46.0)
Hemoglobin: 10.6 g/dL — ABNORMAL LOW (ref 12.0–15.0)
MCH: 33 pg (ref 26.0–34.0)
MCHC: 32.3 g/dL (ref 30.0–36.0)
MCV: 102.2 fL — ABNORMAL HIGH (ref 80.0–100.0)
Platelets: 210 K/uL (ref 150–400)
RBC: 3.21 MIL/uL — ABNORMAL LOW (ref 3.87–5.11)
RDW: 12 % (ref 11.5–15.5)
WBC: 9.7 K/uL (ref 4.0–10.5)
nRBC: 0 % (ref 0.0–0.2)

## 2024-08-19 LAB — URINALYSIS, ROUTINE W REFLEX MICROSCOPIC
Bilirubin Urine: NEGATIVE
Glucose, UA: NEGATIVE mg/dL
Ketones, ur: NEGATIVE mg/dL
Nitrite: NEGATIVE
Protein, ur: NEGATIVE mg/dL
Specific Gravity, Urine: 1.009 (ref 1.005–1.030)
WBC, UA: >50 WBC/hpf (ref 0–5)
pH: 6 (ref 5.0–8.0)

## 2024-08-19 LAB — COMPREHENSIVE METABOLIC PANEL WITH GFR
ALT: <5 U/L (ref 0–44)
AST: 22 U/L (ref 15–41)
Albumin: 3.6 g/dL (ref 3.5–5.0)
Alkaline Phosphatase: 77 U/L (ref 38–126)
Anion gap: 8 (ref 5–15)
BUN: 25 mg/dL — ABNORMAL HIGH (ref 8–23)
CO2: 27 mmol/L (ref 22–32)
Calcium: 9.8 mg/dL (ref 8.9–10.3)
Chloride: 99 mmol/L (ref 98–111)
Creatinine, Ser: 1.18 mg/dL — ABNORMAL HIGH (ref 0.44–1.00)
GFR, Estimated: 44 mL/min — ABNORMAL LOW
Glucose, Bld: 138 mg/dL — ABNORMAL HIGH (ref 70–99)
Potassium: 4.3 mmol/L (ref 3.5–5.1)
Sodium: 135 mmol/L (ref 135–145)
Total Bilirubin: 0.6 mg/dL (ref 0.0–1.2)
Total Protein: 6.5 g/dL (ref 6.5–8.1)

## 2024-08-19 LAB — CBG MONITORING, ED: Glucose-Capillary: 154 mg/dL — ABNORMAL HIGH (ref 70–99)

## 2024-08-19 MED ORDER — ONDANSETRON HCL 4 MG/2ML IJ SOLN: 4.0000 mg | Freq: Four times a day (QID) | INTRAMUSCULAR | Status: DC | PRN

## 2024-08-19 MED ORDER — LIDOCAINE 5 % EX PTCH
1.0000 | MEDICATED_PATCH | CUTANEOUS | Status: DC
  Administered 2024-08-19 – 2024-08-30 (×10): 1 via TRANSDERMAL
  Filled 2024-08-19 (×13): qty 1

## 2024-08-19 MED ORDER — DOCUSATE SODIUM 100 MG PO CAPS
100.0000 mg | ORAL_CAPSULE | Freq: Two times a day (BID) | ORAL | Status: DC
  Administered 2024-08-19 – 2024-08-31 (×23): 100 mg via ORAL
  Filled 2024-08-19 (×23): qty 1

## 2024-08-19 MED ORDER — ACETAMINOPHEN 325 MG PO TABS
650.0000 mg | ORAL_TABLET | Freq: Once | ORAL | Status: AC
  Administered 2024-08-19: 650 mg via ORAL
  Filled 2024-08-19: qty 2

## 2024-08-19 MED ORDER — THIAMINE MONONITRATE 100 MG PO TABS
100.0000 mg | ORAL_TABLET | Freq: Every day | ORAL | Status: DC
  Administered 2024-08-19 – 2024-08-31 (×13): 100 mg via ORAL
  Filled 2024-08-19 (×13): qty 1

## 2024-08-19 MED ORDER — HYDRALAZINE HCL 20 MG/ML IJ SOLN
5.0000 mg | Freq: Four times a day (QID) | INTRAMUSCULAR | Status: DC | PRN
  Administered 2024-08-30: 5 mg via INTRAVENOUS
  Filled 2024-08-19: qty 1

## 2024-08-19 MED ORDER — CARBIDOPA-LEVODOPA 25-100 MG PO TABS
1.0000 | ORAL_TABLET | Freq: Three times a day (TID) | ORAL | Status: DC
  Administered 2024-08-19 – 2024-08-31 (×34): 1 via ORAL
  Filled 2024-08-19 (×34): qty 1

## 2024-08-19 MED ORDER — OXYCODONE HCL 5 MG PO TABS
5.0000 mg | ORAL_TABLET | ORAL | Status: DC | PRN
  Administered 2024-08-20 – 2024-08-30 (×12): 5 mg via ORAL
  Filled 2024-08-19 (×13): qty 1

## 2024-08-19 MED ORDER — MORPHINE SULFATE (PF) 4 MG/ML IV SOLN
4.0000 mg | Freq: Once | INTRAVENOUS | Status: AC
  Administered 2024-08-19: 4 mg via INTRAVENOUS
  Filled 2024-08-19: qty 1

## 2024-08-19 MED ORDER — SENNA 8.6 MG PO TABS
1.0000 | ORAL_TABLET | Freq: Two times a day (BID) | ORAL | Status: DC
  Administered 2024-08-19 – 2024-08-31 (×23): 8.6 mg via ORAL
  Filled 2024-08-19 (×23): qty 1

## 2024-08-19 MED ORDER — ADULT MULTIVITAMIN W/MINERALS CH
1.0000 | ORAL_TABLET | Freq: Every day | ORAL | Status: DC
  Administered 2024-08-20 – 2024-08-31 (×12): 1 via ORAL
  Filled 2024-08-19 (×12): qty 1

## 2024-08-19 MED ORDER — SODIUM CHLORIDE 0.45 % IV SOLN: INTRAVENOUS | Status: AC

## 2024-08-19 MED ORDER — LEVOTHYROXINE SODIUM 75 MCG PO TABS
75.0000 ug | ORAL_TABLET | Freq: Every day | ORAL | Status: DC
  Administered 2024-08-20 – 2024-08-31 (×12): 75 ug via ORAL
  Filled 2024-08-19 (×12): qty 1

## 2024-08-19 MED ORDER — IOHEXOL 300 MG/ML  SOLN
80.0000 mL | Freq: Once | INTRAMUSCULAR | Status: AC | PRN
  Administered 2024-08-19: 80 mL via INTRAVENOUS

## 2024-08-19 MED ORDER — SODIUM CHLORIDE 0.9 % IV SOLN
1.0000 g | Freq: Once | INTRAVENOUS | Status: AC
  Administered 2024-08-19: 1 g via INTRAVENOUS
  Filled 2024-08-19: qty 10

## 2024-08-19 MED ORDER — ONDANSETRON HCL 4 MG PO TABS: 4.0000 mg | ORAL_TABLET | Freq: Four times a day (QID) | ORAL | Status: DC | PRN

## 2024-08-19 MED ORDER — ACETAMINOPHEN 650 MG RE SUPP: 650.0000 mg | Freq: Four times a day (QID) | RECTAL | Status: DC | PRN

## 2024-08-19 MED ORDER — MORPHINE SULFATE (PF) 2 MG/ML IV SOLN
1.0000 mg | INTRAVENOUS | Status: DC | PRN
  Administered 2024-08-21: 1 mg via INTRAVENOUS
  Filled 2024-08-19: qty 1

## 2024-08-19 MED ORDER — FOLIC ACID 1 MG PO TABS
1.0000 mg | ORAL_TABLET | Freq: Every day | ORAL | Status: DC
  Administered 2024-08-19 – 2024-08-31 (×13): 1 mg via ORAL
  Filled 2024-08-19 (×13): qty 1

## 2024-08-19 MED ORDER — ENOXAPARIN SODIUM 40 MG/0.4ML IJ SOSY
40.0000 mg | PREFILLED_SYRINGE | INTRAMUSCULAR | Status: DC
  Administered 2024-08-19 – 2024-08-22 (×4): 40 mg via SUBCUTANEOUS
  Filled 2024-08-19 (×4): qty 0.4

## 2024-08-19 MED ORDER — ACETAMINOPHEN 325 MG PO TABS
650.0000 mg | ORAL_TABLET | Freq: Four times a day (QID) | ORAL | Status: DC | PRN
  Administered 2024-08-26 – 2024-08-29 (×7): 650 mg via ORAL
  Filled 2024-08-19 (×7): qty 2

## 2024-08-19 MED ORDER — SODIUM CHLORIDE 0.9 % IV SOLN
1.0000 g | INTRAVENOUS | Status: AC
  Administered 2024-08-20 – 2024-08-21 (×2): 1 g via INTRAVENOUS
  Filled 2024-08-19 (×2): qty 10

## 2024-08-19 NOTE — ED Provider Notes (Signed)
 " [place] EMERGENCY DEPARTMENT AT [facility] Provider Note   CSN: [id number] Arrival date & time: 08/19/24  1104     Patient presents with: Back Pain   Regina [name] is a 89 y.o. female.   The history is provided by the patient, medical records and the EMS personnel. No language interpreter was used.  Back Pain    89 year old female with history of thyroid  disease, cancer, hypertension, recurrent UTI brought here via EMS from home for evaluation of a fall.  Patient states 2 days ago she was in her bedroom using her walker when she got tangled with her walker and fell.  She fell to the ground and complaining of quite a bit of pain to the left side of her chest from the impact.  Since then she has noticed increasing pain and trouble breathing as well as back pain.  She thinks she may have broken some ribs.  Pain has been persistent and moderate in intensity.  No complaint of headache or neck pain no urinary discomfort no new numbness or new weakness.  She denies confusion.  Prior to Admission medications  Medication Sig Start Date End Date Taking? Authorizing Provider  acetaminophen  (TYLENOL ) 650 MG CR tablet Take 650 mg by mouth in the morning and at bedtime.    [provider]  calcitRIOL  (ROCALTROL ) 0.25 MCG capsule Take 0.25 mcg by mouth daily.    [provider]  carbidopa -levodopa  (SINEMET ) 25-100 MG tablet Take 1 tablet by mouth 3 (three) times daily. 07/02/23   Gayland Lauraine PARAS, NP  SYNTHROID  75 MCG tablet Take 75 mcg by mouth daily before breakfast.    [provider]  SYSTANE ULTRA PF 0.4-0.3 % SOLN Place 1 drop into both eyes in the morning, at noon, and at bedtime.    [provider]  valsartan (DIOVAN) 80 MG tablet Take 80 mg by mouth daily. 04/01/24   [provider]    Allergies: Tape, Penicillins, and Latex    Review of Systems  Musculoskeletal:  Positive for back pain.  All other systems reviewed and are  negative.   Updated Vital Signs BP (!) 133/50 (BP Location: Right Arm)   Pulse 64   Temp (!) 97.4 F (36.3 C) (Oral)   Resp 16   Ht 5' 6 (1.676 m)   Wt 65.8 kg   SpO2 98%   BMI 23.40 kg/m   Physical Exam Vitals and nursing note reviewed.  Constitutional:      General: She is not in acute distress.    Appearance: She is well-developed.     Comments: Patient laying in bed eyes closed appears uncomfortable and moaning.  HENT:     Head: Atraumatic.  Eyes:     Conjunctiva/sclera: Conjunctivae normal.  Cardiovascular:     Rate and Rhythm: Normal rate and regular rhythm.     Pulses: Normal pulses.     Heart sounds: Normal heart sounds.  Pulmonary:     Effort: Pulmonary effort is normal.     Breath sounds: No wheezing, rhonchi or rales.  Chest:     Chest wall: Tenderness (Tenderness to left lower lateral chest wall on palpation with crepitus but no emphysema noted.) present.  Abdominal:     Tenderness: There is no abdominal tenderness.  Musculoskeletal:        General: Tenderness (Mild tenderness along midline spine without crepitus step-off.) present.     Cervical back: Normal range of motion and neck supple.  Skin:  Findings: No rash.  Neurological:     Mental Status: She is alert. Mental status is at baseline.  Psychiatric:        Mood and Affect: Mood normal.     (all labs ordered are listed, but only abnormal results are displayed) Labs Reviewed  COMPREHENSIVE METABOLIC PANEL WITH GFR - Abnormal; Notable for the following components:      Result Value   Glucose, Bld 138 (*)    BUN 25 (*)    Creatinine, Ser 1.18 (*)    GFR, Estimated 44 (*)    All other components within normal limits  CBC - Abnormal; Notable for the following components:   RBC 3.21 (*)    Hemoglobin 10.6 (*)    HCT 32.8 (*)    MCV 102.2 (*)    All other components within normal limits  URINALYSIS, ROUTINE W REFLEX MICROSCOPIC - Abnormal; Notable for the following components:    APPearance HAZY (*)    Hgb urine dipstick SMALL (*)    Leukocytes,Ua LARGE (*)    Bacteria, UA FEW (*)    All other components within normal limits  CBG MONITORING, ED - Abnormal; Notable for the following components:   Glucose-Capillary 154 (*)    All other components within normal limits    EKG: EKG Interpretation Date/Time:  Thursday August 19 2024 11:52:11 EST Ventricular Rate:  64 PR Interval:  194 QRS Duration:  143 QT Interval:  464 QTC Calculation: 479 R Axis:   39  Text Interpretation: Sinus or ectopic atrial rhythm Atrial premature complex Right bundle branch block Confirmed by Mannie Pac [phone]) on 08/19/2024 1:28:39 PM  Radiology: CT Cervical Spine Wo Contrast Result Date: 08/19/2024 CLINICAL DATA:  Polytrauma from fall. EXAM: CT HEAD WITHOUT CONTRAST CT CERVICAL SPINE WITHOUT CONTRAST TECHNIQUE: Multidetector CT imaging of the head and cervical spine was performed following the standard protocol without intravenous contrast. Multiplanar CT image reconstructions of the cervical spine were also generated. RADIATION DOSE REDUCTION: This exam was performed according to the departmental dose-optimization program which includes automated exposure control, adjustment of the mA and/or kV according to patient size and/or use of iterative reconstruction technique. COMPARISON:  Head CT 12/17/2023 and cervical spine/head CT 03/24/2022 FINDINGS: CT HEAD FINDINGS Brain: Ventricles, cisterns and other CSF spaces are normal. There is chronic ischemic microvascular disease. There is no mass, mass effect, shift of midline structures or acute hemorrhage. No evidence of acute infarction. Vascular: No hyperdense vessel or unexpected calcification. Skull: Normal. Negative for fracture or focal lesion. Sinuses/Orbits: Orbits are normal. Paranasal sinuses are well developed with no air-fluid levels. Mucous retention cyst over the right maxillary sinus unchanged. Other: None. CT CERVICAL SPINE  FINDINGS Alignment: Normal. Skull base and vertebrae: Mild to moderate spondylosis throughout the cervical spine to include uncovertebral joint spurring and facet arthropathy. Vertebral body heights are maintained. Atlantoaxial articulation is unremarkable. Mild left-sided neural foraminal narrowing at the C4-5 level with bilateral neural foraminal narrowing at the C5-6 level and mild left-sided neural foraminal narrowing at the C6-7 level. Soft tissues and spinal canal: Prevertebral soft tissues are normal. There is mild canal stenosis at the C5-6 level predominant due to posterior spurring. Disc levels: Disc space narrowing from the C4-5 level to the C6-7 level. Upper chest: No acute findings. Other: None. IMPRESSION: 1. No acute brain injury. 2. Chronic ischemic microvascular disease. 3. No acute cervical spine injury. 4. Mild to moderate spondylosis throughout the cervical spine with disc disease from the C4-5 level to  the C6-7 level. Mild canal stenosis at the C5-6 level predominant due to posterior spurring. Electronically Signed   By: Toribio Agreste M.D.   On: 08/19/2024 14:57   CT Head Wo Contrast Result Date: 08/19/2024 CLINICAL DATA:  Polytrauma from fall. EXAM: CT HEAD WITHOUT CONTRAST CT CERVICAL SPINE WITHOUT CONTRAST TECHNIQUE: Multidetector CT imaging of the head and cervical spine was performed following the standard protocol without intravenous contrast. Multiplanar CT image reconstructions of the cervical spine were also generated. RADIATION DOSE REDUCTION: This exam was performed according to the departmental dose-optimization program which includes automated exposure control, adjustment of the mA and/or kV according to patient size and/or use of iterative reconstruction technique. COMPARISON:  Head CT 12/17/2023 and cervical spine/head CT 03/24/2022 FINDINGS: CT HEAD FINDINGS Brain: Ventricles, cisterns and other CSF spaces are normal. There is chronic ischemic microvascular disease. There is no  mass, mass effect, shift of midline structures or acute hemorrhage. No evidence of acute infarction. Vascular: No hyperdense vessel or unexpected calcification. Skull: Normal. Negative for fracture or focal lesion. Sinuses/Orbits: Orbits are normal. Paranasal sinuses are well developed with no air-fluid levels. Mucous retention cyst over the right maxillary sinus unchanged. Other: None. CT CERVICAL SPINE FINDINGS Alignment: Normal. Skull base and vertebrae: Mild to moderate spondylosis throughout the cervical spine to include uncovertebral joint spurring and facet arthropathy. Vertebral body heights are maintained. Atlantoaxial articulation is unremarkable. Mild left-sided neural foraminal narrowing at the C4-5 level with bilateral neural foraminal narrowing at the C5-6 level and mild left-sided neural foraminal narrowing at the C6-7 level. Soft tissues and spinal canal: Prevertebral soft tissues are normal. There is mild canal stenosis at the C5-6 level predominant due to posterior spurring. Disc levels: Disc space narrowing from the C4-5 level to the C6-7 level. Upper chest: No acute findings. Other: None. IMPRESSION: 1. No acute brain injury. 2. Chronic ischemic microvascular disease. 3. No acute cervical spine injury. 4. Mild to moderate spondylosis throughout the cervical spine with disc disease from the C4-5 level to the C6-7 level. Mild canal stenosis at the C5-6 level predominant due to posterior spurring. Electronically Signed   By: Toribio Agreste M.D.   On: 08/19/2024 14:57   CT T-SPINE NO CHARGE Result Date: 08/19/2024 CLINICAL DATA:  Fall a couple days ago at work with back and rib pain. EXAM: CT THORACIC SPINE WITHOUT CONTRAST; CT LUMBAR SPINE WITHOUT CONTRAST TECHNIQUE: Multidetector CT images of the thoracic were obtained using the standard protocol without intravenous contrast. RADIATION DOSE REDUCTION: This exam was performed according to the departmental dose-optimization program which includes  automated exposure control, adjustment of the mA and/or kV according to patient size and/or use of iterative reconstruction technique. COMPARISON:  CT abdomen/pelvis 09/27/2022. FINDINGS: THORACIC SPINE: Alignment: Moderate curvature of the thoracic spine convex left. Vertebrae: Subtle chronic anterior wedging of a an upper thoracic vertebral body. There is no acute compression fracture. There is mild spondylosis throughout the thoracic spine. Schmorl's node of the inferior endplate of a lower thoracic vertebral body. No canal stenosis. No significant neural foraminal narrowing. Paraspinal and other soft tissues: Negative. Disc levels: Multilevel disc space narrowing throughout the thoracic spine. LUMBAR SPINE: Alignment: Curvature of the lumbar spine convex right. No subluxation/spondylolisthesis. Vertebrae: Moderate spondylosis throughout the lumbar spine to include facet arthropathy. Very subtle depression of the L3 superior endplate compared to the prior exam. This finding is likely not acute although is indeterminate. Paraspinal and other soft tissues: Negative. Disc levels: Disc space narrowing at all levels of  the lumbar spine with relative sparing of the L2-3 level. Mild right-sided neural foraminal narrowing at the L2-3 level. Left-sided neural foraminal narrowing at the L3-4 level. Left-sided neural from narrowing at the L4-5 level. No significant canal stenosis. Extra-spinal structures will be discussed on patient's CT of the chest, abdomen and pelvis. IMPRESSION: 1. No acute compression fracture of the thoracic spine. 2. Very subtle depression of the L3 superior endplate compared to the prior exam. This finding is likely not acute, although is indeterminate. 3. Moderate spondylosis throughout the thoracic and lumbar spine with multilevel disc disease and neural foraminal narrowing as described. No significant canal stenosis. Electronically Signed   By: Toribio Agreste M.D.   On: 08/19/2024 14:47   CT  L-SPINE NO CHARGE Result Date: 08/19/2024 CLINICAL DATA:  Fall a couple days ago at work with back and rib pain. EXAM: CT THORACIC SPINE WITHOUT CONTRAST; CT LUMBAR SPINE WITHOUT CONTRAST TECHNIQUE: Multidetector CT images of the thoracic were obtained using the standard protocol without intravenous contrast. RADIATION DOSE REDUCTION: This exam was performed according to the departmental dose-optimization program which includes automated exposure control, adjustment of the mA and/or kV according to patient size and/or use of iterative reconstruction technique. COMPARISON:  CT abdomen/pelvis 09/27/2022. FINDINGS: THORACIC SPINE: Alignment: Moderate curvature of the thoracic spine convex left. Vertebrae: Subtle chronic anterior wedging of a an upper thoracic vertebral body. There is no acute compression fracture. There is mild spondylosis throughout the thoracic spine. Schmorl's node of the inferior endplate of a lower thoracic vertebral body. No canal stenosis. No significant neural foraminal narrowing. Paraspinal and other soft tissues: Negative. Disc levels: Multilevel disc space narrowing throughout the thoracic spine. LUMBAR SPINE: Alignment: Curvature of the lumbar spine convex right. No subluxation/spondylolisthesis. Vertebrae: Moderate spondylosis throughout the lumbar spine to include facet arthropathy. Very subtle depression of the L3 superior endplate compared to the prior exam. This finding is likely not acute although is indeterminate. Paraspinal and other soft tissues: Negative. Disc levels: Disc space narrowing at all levels of the lumbar spine with relative sparing of the L2-3 level. Mild right-sided neural foraminal narrowing at the L2-3 level. Left-sided neural foraminal narrowing at the L3-4 level. Left-sided neural from narrowing at the L4-5 level. No significant canal stenosis. Extra-spinal structures will be discussed on patient's CT of the chest, abdomen and pelvis. IMPRESSION: 1. No acute  compression fracture of the thoracic spine. 2. Very subtle depression of the L3 superior endplate compared to the prior exam. This finding is likely not acute, although is indeterminate. 3. Moderate spondylosis throughout the thoracic and lumbar spine with multilevel disc disease and neural foraminal narrowing as described. No significant canal stenosis. Electronically Signed   By: Toribio Agreste M.D.   On: 08/19/2024 14:47   CT CHEST ABDOMEN PELVIS W CONTRAST Result Date: 08/19/2024 EXAM: CT CHEST, ABDOMEN AND PELVIS WITH CONTRAST 08/19/2024 01:57:43 PM TECHNIQUE: CT of the chest, abdomen and pelvis was performed with the administration of 80 mL of iohexol  (OMNIPAQUE ) 300 MG/ML solution. Multiplanar reformatted images are provided for review. Automated exposure control, iterative reconstruction, and/or weight based adjustment of the mA/kV was utilized to reduce the radiation dose to as low as reasonably achievable. COMPARISON: 09/27/2022 CLINICAL HISTORY: Polytrauma, blunt Back and rib pain FINDINGS: CHEST: MEDIASTINUM AND LYMPH NODES: Mild to moderate cardiomegaly. Multivessel coronary atherosclerosis. The central airways are clear. No mediastinal, hilar or axillary lymphadenopathy. LUNGS AND PLEURA: Mild intralobular septal thickening with hazy ground glass attenuation within the lungs. Elevation of the  right hemidiaphragm. Posterior bibasilar dependent atelectasis. A few scattered areas of bullous change noted in the lower lobes. Biapical pleural parenchymal scarring. Small bilateral pleural effusions with bibasilar compressive atelectasis. No pneumothorax. ABDOMEN AND PELVIS: LIVER: Mild diffuse hepatic steatosis. 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 GALLBLADDER AND BILE DUCTS: Unremarkable. No biliary ductal dilatation. SPLEEN: No acute abnormality. PANCREAS: No acute abnormality. ADRENAL GLANDS: No acute abnormality. KIDNEYS, URETERS AND BLADDER: Small  nonobstructive right lower pole calculus. No stones in the left kidney or ureters. No hydronephrosis. No perinephric or periureteral stranding. The urinary bladder is distended without focal abnormality. GI AND BOWEL: The stomach is completely decompressed. There is no bowel obstruction. Decompressed noninflamed appendix. REPRODUCTIVE ORGANS: Age related atrophy of the uterus and ovaries. PERITONEUM AND RETROPERITONEUM: No ascites. No free air. VASCULATURE: Atherosclerosis throughout the aorta. Calcified atherosclerosis diffusely throughout the aorta iliac system. Aorta is normal in caliber. ABDOMINAL AND PELVIS LYMPH NODES: No lymphadenopathy. BONES AND SOFT TISSUES: Diffuse osteopenia. Exaggerated thoracic kyphosis with multilevel degenerative disc disease of the spine. Mild dextrocurvature of the thoracolumbar spine. External female catheter device noted. No acute osseous abnormality. No focal soft tissue abnormality. IMPRESSION: 1. No acute, traumatic injury within the chest, abdomen, or pelvis. 2. Cardiomegaly with findings of mild pulmonary edema and small bilateral pleural effusions. 3. Small nonobstructive right lower pole calyceal calculus. No hydronephrosis in either kidney. Electronically signed by: Rogelia Myers MD MD 08/19/2024 02:47 PM EST RP Workstation: [id number]     Procedures   Medications Ordered in the ED  cefTRIAXone  (ROCEPHIN ) 1 g in sodium chloride  0.9 % 100 mL IVPB (has no administration in time range)  morphine  (PF) 4 MG/ML injection 4 mg (has no administration in time range)  morphine  (PF) 4 MG/ML injection 4 mg (4 mg Intravenous Given 08/19/24 1202)  iohexol  (OMNIPAQUE ) 300 MG/ML solution 80 mL (80 mLs Intravenous Contrast Given 08/19/24 1330)    Clinical Course as of 08/19/24 1501  Thu Aug 19, 2024  1458 Fell 2 days ago, had walker next to bed and fell on left side of chest and is having rib pain, positional and reproducible pain, hx of recurrent UTI treating today however no  urinary symptoms [CH]  1459 Pending CT and C spine, if all negative try and walk her, consider admission if pain is not controlled, lives at home with husband and son [CH]    Clinical Course User Index [CH] Hinnant, Terrall FALCON, PA-C                                 Medical Decision Making Amount and/or Complexity of Data Reviewed Labs: ordered. Radiology: ordered.  Risk Prescription drug management.   BP (!) 133/50 (BP Location: Right Arm)   Pulse 64   Temp (!) 97.4 F (36.3 C) (Oral)   Resp 16   SpO2 98%   43:47 AM  89 year old female with history of thyroid  disease, cancer, hypertension, recurrent UTI brought here via EMS from home for evaluation of a fall.  Patient states 2 days ago she was in her bedroom using her walker when she got tangled with her walker and fell.  She fell to the ground and complaining of quite a bit of pain to the left side of her chest from the impact.  Since then she has noticed increasing pain and trouble breathing as well as back pain.  She thinks  she may have broken some ribs.  Pain has been persistent and moderate in intensity.  No complaint of headache or neck pain no urinary discomfort no new numbness or new weakness.  She denies confusion.  Exam notable for reproducible tenderness to left anterolateral chest wall with some crepitus noted but no emphysema.  No bruising noted.  Some tenderness to left upper quadrant of the abdomen as well.  Tenderness along midline spine without crepitus or step-off.  -Labs ordered, independently viewed and interpreted by me.  Labs remarkable for urinalysis shows large leukocyte esterase and greater than 50 WBC, will treat for UTI with Rocephin .  Creatinine of 1.18, IV fluid given. -The patient was maintained on a cardiac monitor.  I personally viewed and interpreted the cardiac monitored which showed an underlying rhythm of: Sinus rhythm -Imaging independently viewed and interpreted by me and I agree with radiologist's  interpretation.  Result remarkable for CT scan of head, cervical spines, chest abdomen pelvis was obtained independent viewed and interpreted by me and fortunately not mention of any  wrist fracture or other acute bony injuries.  Patient does have cardiomegaly and mild pulmonary edema and small bilateral pleural effusions.  Subtle depression of the L3 superior endplate likely not acute but undetermined.  Patient does have tenderness throughout her entire spine on palpation. -This patient presents to the ED for concern of fall, this involves an extensive number of treatment options, and is a complaint that carries with it a high risk of complications and morbidity.  The differential diagnosis includes fracture, dislocation, strain, sprain, contusion, UTI, metabolic derangement -Co morbidities that complicate the patient evaluation includes cancer hypertension, thyroid  disease, recurrent UTI -Treatment includes morphine , Rocephin , IV fluid -Reevaluation of the patient after these medicines showed that the patient improved -PCP office notes or outside notes reviewed -Discussion with oncoming team who will reevaluate pt and if pt unable to ambulate with a walker then consider admission -Escalation to admission/observation considered: dispo pending     Final diagnoses:  None    ED Discharge Orders     None          Nivia Colon, PA-C 08/19/24 1511  "

## 2024-08-19 NOTE — H&P (Signed)
 " [facility] History and Physical  Regina [name] FMW:[id number] DOB: [birth year] DOA: 08/19/2024   PCP: Valma Carwin, MD  Specialists: Dr. Buck is her neurologist  Chief Complaint: Back pain, recent fall, unable to ambulate  HPI: Regina [name] is a 89 y.o. female with a past medical history of parkinsonism, hypothyroidism, essential hypertension who fell about 2 days prior to admission.  She was apparently walking with her walker when she got tangled up and fell to the ground.  Complained of pain to the left side of her chest from the impact.  Pain has been progressively worsening.  And as a result of her pain she has not been able to get out of her bed.  Her husband has been finding it difficult to take care of her as well.  Due to persistent symptoms EMS was called today and she was brought into the emergency department.  Patient is a poor historian.  Husband is at the bedside.  Patient complains of pain to was the left side of her chest as well as her lower back.  Pain is 8 out of 10 in intensity.  Denies any nausea vomiting.  No headaches.  Has noticed some discomfort with urination.  Denies any abdominal pain.  In the emergency department blood work was unremarkable but UA was noted to be abnormal.  CT scan of her chest abdomen pelvis and CT head as well as CT cervical spine did not show any acute findings.  Patient was unable to get out of bed even with help.  Husband unable to take care of her.  She will be brought into the hospital for pain control and physical therapy evaluation.  Home Medications: This list is not reconciled yet Prior to Admission medications  Medication Sig Start Date End Date Taking? Authorizing Provider  acetaminophen  (TYLENOL ) 650 MG CR tablet Take 650 mg by mouth in the morning and at bedtime.    [provider]  calcitRIOL  (ROCALTROL ) 0.25 MCG capsule Take 0.25 mcg by mouth daily.    [provider]  carbidopa -levodopa  (SINEMET ) 25-100 MG tablet  Take 1 tablet by mouth 3 (three) times daily. 07/02/23   Gayland Lauraine PARAS, NP  SYNTHROID  75 MCG tablet Take 75 mcg by mouth daily before breakfast.    [provider]  SYSTANE ULTRA PF 0.4-0.3 % SOLN Place 1 drop into both eyes in the morning, at noon, and at bedtime.    [provider]  valsartan (DIOVAN) 80 MG tablet Take 80 mg by mouth daily. 04/01/24   [provider]    Allergies: Allergies[1]  Past Medical History: Past Medical History:  Diagnosis Date   Allergy    Barrett esophagus 08/12/2008   Cancer (HCC)    GERD (gastroesophageal reflux disease)    Hypertension    Thyroid  disease    UTI (urinary tract infection)     Past Surgical History:  Procedure Laterality Date   APPENDECTOMY     COLON SURGERY     COLONOSCOPY     COLONOSCOPY N/A 08/03/2014   Procedure: COLONOSCOPY;  Surgeon: Lamar JONETTA Aho, MD;  Location: [facility] ENDOSCOPY;  Service: Endoscopy;  Laterality: N/A;   POLYPECTOMY     TONSILLECTOMY     TOTAL THYROIDECTOMY     TUBAL LIGATION      Social History: Lives with her husband.  No smoking alcohol  or illicit drug use.  Uses a walker to ambulate at baseline.  Family History:  Family History  Problem Relation  Age of Onset   Heart disease Mother    Heart disease Father    Diabetes Sister    COPD Brother    Diabetes Brother    Parkinson's disease Neg Hx      Review of Systems -unable to do at this time as patient is in significant pain and is a poor historian.  Physical Examination  Vitals:   08/19/24 1530 08/19/24 1545 08/19/24 1600 08/19/24 1854  BP: (!) 143/42 (!) 146/58 (!) 149/51 (!) 162/56  Pulse: 61 (!) 59 (!) 54 67  Resp: 19 19 (!) 26 14  Temp:      TempSrc:      SpO2: 95% 95% 96% 98%  Weight:      Height:        BP (!) 162/56 (BP Location: Left Arm)   Pulse 67   Temp (!) 97.4 F (36.3 C) (Oral)   Resp 14   Ht 5' 6 (1.676 m)   Wt 65.8 kg   SpO2 98%   BMI 23.40 kg/m   General appearance: alert,  cooperative, appears stated age, and no distress Head: Normocephalic, without obvious abnormality, atraumatic Eyes: conjunctivae/corneas clear. PERRL, EOM's intact.  Throat: Dry mucous membranes Neck: no adenopathy, no carotid bruit, no JVD, supple, symmetrical, trachea midline, and thyroid  not enlarged, symmetric, no tenderness/mass/nodules Resp: Normal effort at rest.  Diminished air entry at the bases.  No definite wheezing or rhonchi.  No definite crackles. Cardio: regular rate and rhythm, S1, S2 normal, no murmur, click, rub or gallop GI: soft, non-tender; bowel sounds normal; no masses,  no organomegaly Extremities: extremities normal, atraumatic, no cyanosis or edema Pulses: 2+ and symmetric Skin: Skin color, texture, turgor normal. No rashes or lesions Lymph nodes: Cervical, supraclavicular, and axillary nodes normal. Neurologic: Graphically deconditioned but no obvious focal neurological deficits noted.   Labs on Admission: I have personally reviewed following labs and reports of imaging studies  CBC: Recent Labs  Lab 08/19/24 1153  WBC 9.7  HGB 10.6*  HCT 32.8*  MCV 102.2*  PLT 210   Basic Metabolic Panel: Recent Labs  Lab 08/19/24 1153  NA 135  K 4.3  CL 99  CO2 27  GLUCOSE 138*  BUN 25*  CREATININE 1.18*  CALCIUM 9.8   GFR: Estimated Creatinine Clearance: 30.3 mL/min (A) (by C-G formula based on SCr of 1.18 mg/dL (H)). Liver Function Tests: Recent Labs  Lab 08/19/24 1153  AST 22  ALT <5  ALKPHOS 77  BILITOT 0.6  PROT 6.5  ALBUMIN 3.6    BNP (last 3 results) Recent Labs    12/03/23 1607  PROBNP 774.0*   CBG: Recent Labs  Lab 08/19/24 1146  GLUCAP 154*    Radiological Exams on Admission: CT Cervical Spine Wo Contrast Result Date: 08/19/2024 CLINICAL DATA:  Polytrauma from fall. EXAM: CT HEAD WITHOUT CONTRAST CT CERVICAL SPINE WITHOUT CONTRAST TECHNIQUE: Multidetector CT imaging of the head and cervical spine was performed following the  standard protocol without intravenous contrast. Multiplanar CT image reconstructions of the cervical spine were also generated. RADIATION DOSE REDUCTION: This exam was performed according to the departmental dose-optimization program which includes automated exposure control, adjustment of the mA and/or kV according to patient size and/or use of iterative reconstruction technique. COMPARISON:  Head CT 12/17/2023 and cervical spine/head CT 03/24/2022 FINDINGS: CT HEAD FINDINGS Brain: Ventricles, cisterns and other CSF spaces are normal. There is chronic ischemic microvascular disease. There is no mass, mass effect, shift of midline structures or  acute hemorrhage. No evidence of acute infarction. Vascular: No hyperdense vessel or unexpected calcification. Skull: Normal. Negative for fracture or focal lesion. Sinuses/Orbits: Orbits are normal. Paranasal sinuses are well developed with no air-fluid levels. Mucous retention cyst over the right maxillary sinus unchanged. Other: None. CT CERVICAL SPINE FINDINGS Alignment: Normal. Skull base and vertebrae: Mild to moderate spondylosis throughout the cervical spine to include uncovertebral joint spurring and facet arthropathy. Vertebral body heights are maintained. Atlantoaxial articulation is unremarkable. Mild left-sided neural foraminal narrowing at the C4-5 level with bilateral neural foraminal narrowing at the C5-6 level and mild left-sided neural foraminal narrowing at the C6-7 level. Soft tissues and spinal canal: Prevertebral soft tissues are normal. There is mild canal stenosis at the C5-6 level predominant due to posterior spurring. Disc levels: Disc space narrowing from the C4-5 level to the C6-7 level. Upper chest: No acute findings. Other: None. IMPRESSION: 1. No acute brain injury. 2. Chronic ischemic microvascular disease. 3. No acute cervical spine injury. 4. Mild to moderate spondylosis throughout the cervical spine with disc disease from the C4-5 level to  the C6-7 level. Mild canal stenosis at the C5-6 level predominant due to posterior spurring. Electronically Signed   By: Toribio Agreste M.D.   On: 08/19/2024 14:57   CT Head Wo Contrast Result Date: 08/19/2024 CLINICAL DATA:  Polytrauma from fall. EXAM: CT HEAD WITHOUT CONTRAST CT CERVICAL SPINE WITHOUT CONTRAST TECHNIQUE: Multidetector CT imaging of the head and cervical spine was performed following the standard protocol without intravenous contrast. Multiplanar CT image reconstructions of the cervical spine were also generated. RADIATION DOSE REDUCTION: This exam was performed according to the departmental dose-optimization program which includes automated exposure control, adjustment of the mA and/or kV according to patient size and/or use of iterative reconstruction technique. COMPARISON:  Head CT 12/17/2023 and cervical spine/head CT 03/24/2022 FINDINGS: CT HEAD FINDINGS Brain: Ventricles, cisterns and other CSF spaces are normal. There is chronic ischemic microvascular disease. There is no mass, mass effect, shift of midline structures or acute hemorrhage. No evidence of acute infarction. Vascular: No hyperdense vessel or unexpected calcification. Skull: Normal. Negative for fracture or focal lesion. Sinuses/Orbits: Orbits are normal. Paranasal sinuses are well developed with no air-fluid levels. Mucous retention cyst over the right maxillary sinus unchanged. Other: None. CT CERVICAL SPINE FINDINGS Alignment: Normal. Skull base and vertebrae: Mild to moderate spondylosis throughout the cervical spine to include uncovertebral joint spurring and facet arthropathy. Vertebral body heights are maintained. Atlantoaxial articulation is unremarkable. Mild left-sided neural foraminal narrowing at the C4-5 level with bilateral neural foraminal narrowing at the C5-6 level and mild left-sided neural foraminal narrowing at the C6-7 level. Soft tissues and spinal canal: Prevertebral soft tissues are normal. There is mild  canal stenosis at the C5-6 level predominant due to posterior spurring. Disc levels: Disc space narrowing from the C4-5 level to the C6-7 level. Upper chest: No acute findings. Other: None. IMPRESSION: 1. No acute brain injury. 2. Chronic ischemic microvascular disease. 3. No acute cervical spine injury. 4. Mild to moderate spondylosis throughout the cervical spine with disc disease from the C4-5 level to the C6-7 level. Mild canal stenosis at the C5-6 level predominant due to posterior spurring. Electronically Signed   By: Toribio Agreste M.D.   On: 08/19/2024 14:57   CT T-SPINE NO CHARGE Result Date: 08/19/2024 CLINICAL DATA:  Fall a couple days ago at work with back and rib pain. EXAM: CT THORACIC SPINE WITHOUT CONTRAST; CT LUMBAR SPINE WITHOUT CONTRAST TECHNIQUE: Multidetector  CT images of the thoracic were obtained using the standard protocol without intravenous contrast. RADIATION DOSE REDUCTION: This exam was performed according to the departmental dose-optimization program which includes automated exposure control, adjustment of the mA and/or kV according to patient size and/or use of iterative reconstruction technique. COMPARISON:  CT abdomen/pelvis 09/27/2022. FINDINGS: THORACIC SPINE: Alignment: Moderate curvature of the thoracic spine convex left. Vertebrae: Subtle chronic anterior wedging of a an upper thoracic vertebral body. There is no acute compression fracture. There is mild spondylosis throughout the thoracic spine. Schmorl's node of the inferior endplate of a lower thoracic vertebral body. No canal stenosis. No significant neural foraminal narrowing. Paraspinal and other soft tissues: Negative. Disc levels: Multilevel disc space narrowing throughout the thoracic spine. LUMBAR SPINE: Alignment: Curvature of the lumbar spine convex right. No subluxation/spondylolisthesis. Vertebrae: Moderate spondylosis throughout the lumbar spine to include facet arthropathy. Very subtle depression of the L3  superior endplate compared to the prior exam. This finding is likely not acute although is indeterminate. Paraspinal and other soft tissues: Negative. Disc levels: Disc space narrowing at all levels of the lumbar spine with relative sparing of the L2-3 level. Mild right-sided neural foraminal narrowing at the L2-3 level. Left-sided neural foraminal narrowing at the L3-4 level. Left-sided neural from narrowing at the L4-5 level. No significant canal stenosis. Extra-spinal structures will be discussed on patient's CT of the chest, abdomen and pelvis. IMPRESSION: 1. No acute compression fracture of the thoracic spine. 2. Very subtle depression of the L3 superior endplate compared to the prior exam. This finding is likely not acute, although is indeterminate. 3. Moderate spondylosis throughout the thoracic and lumbar spine with multilevel disc disease and neural foraminal narrowing as described. No significant canal stenosis. Electronically Signed   By: Toribio Agreste M.D.   On: 08/19/2024 14:47   CT L-SPINE NO CHARGE Result Date: 08/19/2024 CLINICAL DATA:  Fall a couple days ago at work with back and rib pain. EXAM: CT THORACIC SPINE WITHOUT CONTRAST; CT LUMBAR SPINE WITHOUT CONTRAST TECHNIQUE: Multidetector CT images of the thoracic were obtained using the standard protocol without intravenous contrast. RADIATION DOSE REDUCTION: This exam was performed according to the departmental dose-optimization program which includes automated exposure control, adjustment of the mA and/or kV according to patient size and/or use of iterative reconstruction technique. COMPARISON:  CT abdomen/pelvis 09/27/2022. FINDINGS: THORACIC SPINE: Alignment: Moderate curvature of the thoracic spine convex left. Vertebrae: Subtle chronic anterior wedging of a an upper thoracic vertebral body. There is no acute compression fracture. There is mild spondylosis throughout the thoracic spine. Schmorl's node of the inferior endplate of a lower  thoracic vertebral body. No canal stenosis. No significant neural foraminal narrowing. Paraspinal and other soft tissues: Negative. Disc levels: Multilevel disc space narrowing throughout the thoracic spine. LUMBAR SPINE: Alignment: Curvature of the lumbar spine convex right. No subluxation/spondylolisthesis. Vertebrae: Moderate spondylosis throughout the lumbar spine to include facet arthropathy. Very subtle depression of the L3 superior endplate compared to the prior exam. This finding is likely not acute although is indeterminate. Paraspinal and other soft tissues: Negative. Disc levels: Disc space narrowing at all levels of the lumbar spine with relative sparing of the L2-3 level. Mild right-sided neural foraminal narrowing at the L2-3 level. Left-sided neural foraminal narrowing at the L3-4 level. Left-sided neural from narrowing at the L4-5 level. No significant canal stenosis. Extra-spinal structures will be discussed on patient's CT of the chest, abdomen and pelvis. IMPRESSION: 1. No acute compression fracture of the thoracic spine. 2.  Very subtle depression of the L3 superior endplate compared to the prior exam. This finding is likely not acute, although is indeterminate. 3. Moderate spondylosis throughout the thoracic and lumbar spine with multilevel disc disease and neural foraminal narrowing as described. No significant canal stenosis. Electronically Signed   By: Toribio Agreste M.D.   On: 08/19/2024 14:47   CT CHEST ABDOMEN PELVIS W CONTRAST Result Date: 08/19/2024 EXAM: CT CHEST, ABDOMEN AND PELVIS WITH CONTRAST 08/19/2024 01:57:43 PM TECHNIQUE: CT of the chest, abdomen and pelvis was performed with the administration of 80 mL of iohexol  (OMNIPAQUE ) 300 MG/ML solution. Multiplanar reformatted images are provided for review. Automated exposure control, iterative reconstruction, and/or weight based adjustment of the mA/kV was utilized to reduce the radiation dose to as low as reasonably achievable.  COMPARISON: 09/27/2022 CLINICAL HISTORY: Polytrauma, blunt Back and rib pain FINDINGS: CHEST: MEDIASTINUM AND LYMPH NODES: Mild to moderate cardiomegaly. Multivessel coronary atherosclerosis. The central airways are clear. No mediastinal, hilar or axillary lymphadenopathy. LUNGS AND PLEURA: Mild intralobular septal thickening with hazy ground glass attenuation within the lungs. Elevation of the right hemidiaphragm. Posterior bibasilar dependent atelectasis. A few scattered areas of bullous change noted in the lower lobes. Biapical pleural parenchymal scarring. Small bilateral pleural effusions with bibasilar compressive atelectasis. No pneumothorax. ABDOMEN AND PELVIS: LIVER: Mild diffuse hepatic steatosis. 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 GALLBLADDER AND BILE DUCTS: Unremarkable. No biliary ductal dilatation. SPLEEN: No acute abnormality. PANCREAS: No acute abnormality. ADRENAL GLANDS: No acute abnormality. KIDNEYS, URETERS AND BLADDER: Small nonobstructive right lower pole calculus. No stones in the left kidney or ureters. No hydronephrosis. No perinephric or periureteral stranding. The urinary bladder is distended without focal abnormality. GI AND BOWEL: The stomach is completely decompressed. There is no bowel obstruction. Decompressed noninflamed appendix. REPRODUCTIVE ORGANS: Age related atrophy of the uterus and ovaries. PERITONEUM AND RETROPERITONEUM: No ascites. No free air. VASCULATURE: Atherosclerosis throughout the aorta. Calcified atherosclerosis diffusely throughout the aorta iliac system. Aorta is normal in caliber. ABDOMINAL AND PELVIS LYMPH NODES: No lymphadenopathy. BONES AND SOFT TISSUES: Diffuse osteopenia. Exaggerated thoracic kyphosis with multilevel degenerative disc disease of the spine. Mild dextrocurvature of the thoracolumbar spine. External female catheter device noted. No acute osseous abnormality. No focal soft tissue abnormality.  IMPRESSION: 1. No acute, traumatic injury within the chest, abdomen, or pelvis. 2. Cardiomegaly with findings of mild pulmonary edema and small bilateral pleural effusions. 3. Small nonobstructive right lower pole calyceal calculus. No hydronephrosis in either kidney. Electronically signed by: Rogelia Myers MD MD 08/19/2024 02:47 PM EST RP Workstation: [id number]    My interpretation of Electrocardiogram: Sinus rhythm in the 60s.  RBBB is noted.  Normal axis.  Intervals are normal otherwise.  No concerning ST or T wave changes.   Problem List  Principal Problem:   Ambulatory dysfunction Active Problems:   Chronic diastolic heart failure (HCC)   Essential hypertension   Parkinsonism (HCC)   Back pain   Assessment: 89 year old Caucasian female with past medical history as stated earlier who presents after sustaining a fall 2 days ago.  No obvious injuries have been noted but patient unable to ambulate.  Experiencing a lot of pain which is the likely reason for her difficulty with getting out of bed.  Plan:  Musculoskeletal pain secondary to fall She has pain in the left chest area as well as her lower back.  Imaging studies have not shown any obvious injuries.  She is significantly deconditioned.  She was however able to raise her legs.  No hip abnormality noted on examination. CT of the lumbar spine showed very subtle depression of the L3 superior endplate compared to the prior exam. This finding is likely not acute, although is indeterminate. Pain medications will be ordered.  Lidoderm  patch to the lower back.  PT and OT evaluation.  Urinary tract infection She did mention dysuria.  She has been given ceftriaxone .  Will give her a 3-day course.  Parkinsonism Followed by Dr. Buck with neurology.  Continue with Sinemet .  Macrocytic anemia Hemoglobin seems to be at baseline.  TSH was normal back in May.  Will repeat. Anemia panel as well.  Chronic kidney disease stage  IIIb Creatinine seems to be at baseline.  Monitor urine output.  Avoid nephrotoxic agents. CT scan raised concern for mild pulmonary edema although clinical examination does not support this.  Saturations are normal on room air.  DVT Prophylaxis: Lovenox  Code Status: DNR per discussion with patient's husband Family Communication: Discussed with patient's husband Disposition: To be determined Consults called: None Admission Status: Status is: Observation The patient remains OBS appropriate and will d/c before 2 midnights.    Severity of Illness: The appropriate patient status for this patient is OBSERVATION. Observation status is judged to be reasonable and necessary in order to provide the required intensity of service to ensure the patient's safety. The patient's presenting symptoms, physical exam findings, and initial radiographic and laboratory data in the context of their medical condition is felt to place them at decreased risk for further clinical deterioration. Furthermore, it is anticipated that the patient will be medically stable for discharge from the hospital within 2 midnights of admission.    Further management decisions will depend on results of further testing and patient's response to treatment.   Regina [name]  [facility] Pager on [email or web address]  08/19/2024, 7:24 PM     [1]  Allergies Allergen Reactions   Tape Other (See Comments)    TAPE PULLS OFF THE SKIN, SO PLEASE USE AN ALTERNATIVE   Penicillins Hives and Itching   Latex Rash and Other (See Comments)    NO Band-Aids!! Pulls off skin    "

## 2024-08-19 NOTE — ED Provider Notes (Signed)
 " Physical Exam  BP (!) 149/51   Pulse (!) 54   Temp (!) 97.4 F (36.3 C) (Oral)   Resp (!) 26   Ht 5' 6 (1.676 m)   Wt 65.8 kg   SpO2 96%   BMI 23.40 kg/m   Physical Exam Vitals and nursing note reviewed.  Constitutional:      General: She is awake. She is not in acute distress.    Appearance: She is well-developed. She is not toxic-appearing or diaphoretic.     Comments: Chronically ill-appearing  HENT:     Head: Normocephalic and atraumatic.     Comments: No raccoon eyes, no Battle sign Neck:     Comments: No tenderness with palpation of cervical spine Pulmonary:     Effort: Pulmonary effort is normal. No respiratory distress.  Chest:     Chest wall: Tenderness (Tenderness of left side of chest wall over ribs) present.  Abdominal:     Palpations: Abdomen is soft.  Musculoskeletal:     Right lower leg: No tenderness. No edema.     Left lower leg: No tenderness. No edema.     Comments: No tenderness directly over cervical, thoracic, or lumbar spine, patient does have significant posterior rib pain on the left side that travels from the left side of her anterior chest wall around to her left flank, patient also has mild left hip pain  Patient was unable to raise left lower extremity off the bed against gravity due to pain, was able to raise right lower extremity off the bed against gravity as well as resistance  No tenderness with palpation of abdomen, pelvis feels stable, femur, knees, lower extremities nontender to palpation, bilateral upper extremities nontender  Skin:    General: Skin is warm.     Capillary Refill: Capillary refill takes less than 2 seconds.  Neurological:     General: No focal deficit present.     Mental Status: She is alert and oriented to person, place, and time.  Psychiatric:        Mood and Affect: Mood normal.        Behavior: Behavior normal. Behavior is cooperative.     Procedures  Procedures  ED Course / MDM   Clinical Course as of  08/19/24 1643  Thu Aug 19, 2024  1458 Fell 2 days ago, had walker next to bed and fell on left side of chest and is having rib pain, positional and reproducible pain, hx of recurrent UTI treating today however no urinary symptoms [CH]  1459 Pending CT and C spine, if all negative try and walk her, consider admission if pain is not controlled, lives at home with husband and son [CH]    Clinical Course User Index [CH] Josua Ferrebee, [place] FALCON, PA-C   Medical Decision Making Amount and/or Complexity of Data Reviewed Labs: ordered. Radiology: ordered.  Risk OTC drugs. Prescription drug management. Decision regarding hospitalization.   Patient signed out to myself at time of shift change by Lonni Camp PA-C, please see his note for further detail.  Briefly patient is an 89 year old female who had a mechanical fall at home approximately 2 days ago.  Denies known head or neck injury.  Patient does have a history of chronic neck pain.  No blood thinning medication.  Patient walks with a walker at baseline however has had some significant weakness due to pain over the last 2 days.  Husband who is caregiver is at bedside and states that the patient has  been unable to get out of bed and walk well.  Patient's main complaint is left-sided chest wall pain as well as left-sided flank pain, also states that she feels quite weak due to pain.  Patient appears visibly uncomfortable at time my first evaluation.  At time of signout imaging has just resulted which is all reassuring.  CT scans of head, neck, thoracic, lumbar, as well as chest abdomen and pelvis were completed which showed no acute injuries from the fall, patient is being treated for a possible UTI and states that she has had a mild amount of dysuria as well as possible urgency and frequency.  Denies frank hematuria.  Labs otherwise seem consistent with previous.  Due to continued discomfort and patient living at home with her elderly husband who is unable  to care for her in this condition, will consult hospitalist for admission for musculoskeletal trauma following fall. Spoke with Dr. Verdene who agrees with hospital admission for ongoing care. Patient admitted for ongoing pain from mechanical fall.        Janetta Terrall FALCON, [place] 08/20/24 0140    Dasie Faden, MD 08/20/24 1028  "

## 2024-08-19 NOTE — ED Triage Notes (Addendum)
 Pt BIB EMS due to falling Tuesday and having back pain and rib pain since. No Hx of back pain prior. Denies hitting head, denies blood thinners

## 2024-08-20 ENCOUNTER — Encounter (HOSPITAL_COMMUNITY): Payer: Self-pay | Admitting: Internal Medicine

## 2024-08-20 ENCOUNTER — Other Ambulatory Visit: Payer: Self-pay

## 2024-08-20 DIAGNOSIS — R0789 Other chest pain: Secondary | ICD-10-CM | POA: Diagnosis present

## 2024-08-20 DIAGNOSIS — N1831 Chronic kidney disease, stage 3a: Secondary | ICD-10-CM | POA: Diagnosis present

## 2024-08-20 DIAGNOSIS — K5900 Constipation, unspecified: Secondary | ICD-10-CM | POA: Diagnosis present

## 2024-08-20 DIAGNOSIS — I5032 Chronic diastolic (congestive) heart failure: Secondary | ICD-10-CM | POA: Diagnosis present

## 2024-08-20 DIAGNOSIS — Z91040 Latex allergy status: Secondary | ICD-10-CM | POA: Diagnosis not present

## 2024-08-20 DIAGNOSIS — Z8249 Family history of ischemic heart disease and other diseases of the circulatory system: Secondary | ICD-10-CM | POA: Diagnosis not present

## 2024-08-20 DIAGNOSIS — W19XXXA Unspecified fall, initial encounter: Secondary | ICD-10-CM | POA: Diagnosis present

## 2024-08-20 DIAGNOSIS — G20A1 Parkinson's disease without dyskinesia, without mention of fluctuations: Secondary | ICD-10-CM | POA: Diagnosis present

## 2024-08-20 DIAGNOSIS — I130 Hypertensive heart and chronic kidney disease with heart failure and stage 1 through stage 4 chronic kidney disease, or unspecified chronic kidney disease: Secondary | ICD-10-CM | POA: Diagnosis present

## 2024-08-20 DIAGNOSIS — J189 Pneumonia, unspecified organism: Secondary | ICD-10-CM | POA: Diagnosis not present

## 2024-08-20 DIAGNOSIS — A419 Sepsis, unspecified organism: Secondary | ICD-10-CM | POA: Diagnosis not present

## 2024-08-20 DIAGNOSIS — Y9301 Activity, walking, marching and hiking: Secondary | ICD-10-CM | POA: Diagnosis present

## 2024-08-20 DIAGNOSIS — Z66 Do not resuscitate: Secondary | ICD-10-CM | POA: Diagnosis present

## 2024-08-20 DIAGNOSIS — E890 Postprocedural hypothyroidism: Secondary | ICD-10-CM | POA: Diagnosis present

## 2024-08-20 DIAGNOSIS — S2242XA Multiple fractures of ribs, left side, initial encounter for closed fracture: Secondary | ICD-10-CM | POA: Diagnosis present

## 2024-08-20 DIAGNOSIS — Z825 Family history of asthma and other chronic lower respiratory diseases: Secondary | ICD-10-CM | POA: Diagnosis not present

## 2024-08-20 DIAGNOSIS — D539 Nutritional anemia, unspecified: Secondary | ICD-10-CM | POA: Diagnosis present

## 2024-08-20 DIAGNOSIS — Z91048 Other nonmedicinal substance allergy status: Secondary | ICD-10-CM | POA: Diagnosis not present

## 2024-08-20 DIAGNOSIS — N179 Acute kidney failure, unspecified: Secondary | ICD-10-CM | POA: Diagnosis present

## 2024-08-20 DIAGNOSIS — N390 Urinary tract infection, site not specified: Secondary | ICD-10-CM | POA: Diagnosis present

## 2024-08-20 DIAGNOSIS — E871 Hypo-osmolality and hyponatremia: Secondary | ICD-10-CM | POA: Diagnosis present

## 2024-08-20 DIAGNOSIS — Z79890 Hormone replacement therapy (postmenopausal): Secondary | ICD-10-CM | POA: Diagnosis not present

## 2024-08-20 DIAGNOSIS — R262 Difficulty in walking, not elsewhere classified: Secondary | ICD-10-CM | POA: Diagnosis present

## 2024-08-20 DIAGNOSIS — Z515 Encounter for palliative care: Secondary | ICD-10-CM | POA: Diagnosis not present

## 2024-08-20 DIAGNOSIS — E860 Dehydration: Secondary | ICD-10-CM | POA: Diagnosis present

## 2024-08-20 DIAGNOSIS — Z880 Allergy status to penicillin: Secondary | ICD-10-CM | POA: Diagnosis not present

## 2024-08-20 DIAGNOSIS — F0280 Dementia in other diseases classified elsewhere without behavioral disturbance: Secondary | ICD-10-CM | POA: Diagnosis present

## 2024-08-20 LAB — IRON AND TIBC
Iron: 45 ug/dL (ref 28–170)
Saturation Ratios: 16 % (ref 10.4–31.8)
TIBC: 272 ug/dL (ref 250–450)
UIBC: 227 ug/dL

## 2024-08-20 LAB — FERRITIN: Ferritin: 119 ng/mL (ref 11–307)

## 2024-08-20 LAB — BASIC METABOLIC PANEL WITH GFR
Anion gap: 10 (ref 5–15)
BUN: 22 mg/dL (ref 8–23)
CO2: 25 mmol/L (ref 22–32)
Calcium: 9.2 mg/dL (ref 8.9–10.3)
Chloride: 100 mmol/L (ref 98–111)
Creatinine, Ser: 1.14 mg/dL — ABNORMAL HIGH (ref 0.44–1.00)
GFR, Estimated: 46 mL/min — ABNORMAL LOW
Glucose, Bld: 71 mg/dL (ref 70–99)
Potassium: 4.3 mmol/L (ref 3.5–5.1)
Sodium: 135 mmol/L (ref 135–145)

## 2024-08-20 LAB — CBC
HCT: 30.3 % — ABNORMAL LOW (ref 36.0–46.0)
Hemoglobin: 10.1 g/dL — ABNORMAL LOW (ref 12.0–15.0)
MCH: 33.4 pg (ref 26.0–34.0)
MCHC: 33.3 g/dL (ref 30.0–36.0)
MCV: 100.3 fL — ABNORMAL HIGH (ref 80.0–100.0)
Platelets: 198 K/uL (ref 150–400)
RBC: 3.02 MIL/uL — ABNORMAL LOW (ref 3.87–5.11)
RDW: 12.1 % (ref 11.5–15.5)
WBC: 9.7 K/uL (ref 4.0–10.5)
nRBC: 0 % (ref 0.0–0.2)

## 2024-08-20 LAB — RETICULOCYTES
Immature Retic Fract: 14.2 % (ref 2.3–15.9)
RBC.: 2.98 MIL/uL — ABNORMAL LOW (ref 3.87–5.11)
Retic Count, Absolute: 36.7 K/uL (ref 19.0–186.0)
Retic Ct Pct: 1.2 % (ref 0.4–3.1)

## 2024-08-20 LAB — TSH: TSH: 12 u[IU]/mL — ABNORMAL HIGH (ref 0.350–4.500)

## 2024-08-20 LAB — FOLATE: Folate: 17.4 ng/mL

## 2024-08-20 LAB — VITAMIN B12: Vitamin B-12: 811 pg/mL (ref 180–914)

## 2024-08-20 MED ORDER — OXYCODONE HCL ER 10 MG PO T12A
10.0000 mg | EXTENDED_RELEASE_TABLET | Freq: Two times a day (BID) | ORAL | Status: DC
  Administered 2024-08-20 – 2024-08-25 (×10): 10 mg via ORAL
  Filled 2024-08-20 (×10): qty 1

## 2024-08-20 NOTE — Progress Notes (Signed)
 " PROGRESS NOTE    Regina [name]  FMW:[id number] DOB: [birth year] DOA: 08/19/2024 PCP: Valma Carwin, MD  Outpatient Specialists:     Brief Narrative:  Patient is an 89 year old female with past medical history significant for parkinsonism, hypertension and hypothyroidism.  Patient was admitted with back pain, inability to ambulate following recent fall.  On presentation to the emergency room, blood work was unremarkable but UA was noted to be abnormal. CT scan of her chest abdomen pelvis and CT head as well as CT cervical spine did not show any acute findings. Patient was unable to get out of bed even with help.  Husband is unable to take care of patient.  Pain remains uncontrolled.  08/20/2024: Patient seen alongside patient's husband.  Pain remains well-controlled.  Specifically, patient reports pain around the mid, lateral rib cage area.  Will apply lidocaine  patch.  Continue oxycodone  5 mg every 4 hours as needed.  Will start patient on OxyContin  10 mg p.o. twice daily.   Assessment & Plan:   Principal Problem:   Ambulatory dysfunction Active Problems:   Chronic diastolic heart failure (HCC)   Essential hypertension   Parkinsonism (HCC)   Back pain   Musculoskeletal pain secondary to fall -Patient reports pain in the left chest area as well as her lower back.  - No significant findings on imaging studies.  CT of the lumbar spine showed very subtle depression of the L3 superior endplate compared to the prior exam. This finding is likely not acute, although is indeterminate. -Optimize pain control. - PT/OT input.     Possible urinary tract infection: -No urine culture visualized. - Patient is on IV Rocephin . - Complete 3-day course of Rocephin .     Parkinsonism Followed by Dr. Buck with neurology.   Continue with Sinemet . Parkinsonism may be contributing to patient's falls.   Macrocytic anemia -Vitamin B12 level of 811. - Folate level of 17.4. - Iron of 45, ferritin of 119,  TIBC of 272 and saturation ratio of 16.  - Depending on goals of care, may consider referral to hematology team on discharge to rule out myelodysplastic syndrome.   Chronic kidney disease stage IIIa/b -Currently at baseline. - Serum creatinine of 1.14 today, with eGFR of 46 mL/min per 1.73 m.     DVT prophylaxis: Subcutaneous Lovenox  Code Status: DO NOT RESUSCITATE Family Communication: Husband by bedside Disposition Plan: Admit to inpatient   Consultants:  None.  Procedures:  None.  Antimicrobials:  IV Rocephin  for possible UTI.   Subjective: Pain is uncontrolled. See above documentation  Objective: Vitals:   08/19/24 1944 08/19/24 2041 08/20/24 0142 08/20/24 0603  BP:  (!) 151/47 (!) 153/56 (!) 147/41  Pulse:  63 75 73  Resp:  18 18 18   Temp:  98.4 F (36.9 C) 98.6 F (37 C) 97.8 F (36.6 C)  TempSrc:  Oral Oral Oral  SpO2:  98% 96% 93%  Weight: 64.1 kg     Height:        Intake/Output Summary (Last 24 hours) at 08/20/2024 1643 Last data filed at 08/20/2024 1345 Gross per 24 hour  Intake 1598.48 ml  Output 1100 ml  Net 498.48 ml   Filed Weights   08/19/24 1206 08/19/24 1944  Weight: 65.8 kg 64.1 kg    Examination:  General exam: Patient is awake and alert.  Patient is in painful distress.  Patient is pale.   Respiratory system: Clear to auscultation. Respiratory effort normal. Cardiovascular system: S1 & S2 heard Gastrointestinal  system: Abdomen is soft and nontender.  Central nervous system: Awake and alert.     Data Reviewed: I have personally reviewed following labs and imaging studies  CBC: Recent Labs  Lab 08/19/24 1153 08/20/24 0542  WBC 9.7 9.7  HGB 10.6* 10.1*  HCT 32.8* 30.3*  MCV 102.2* 100.3*  PLT 210 198   Basic Metabolic Panel: Recent Labs  Lab 08/19/24 1153 08/20/24 0542  NA 135 135  K 4.3 4.3  CL 99 100  CO2 27 25  GLUCOSE 138* 71  BUN 25* 22  CREATININE 1.18* 1.14*  CALCIUM 9.8 9.2   GFR: Estimated Creatinine  Clearance: 31.3 mL/min (A) (by C-G formula based on SCr of 1.14 mg/dL (H)). Liver Function Tests: Recent Labs  Lab 08/19/24 1153  AST 22  ALT <5  ALKPHOS 77  BILITOT 0.6  PROT 6.5  ALBUMIN 3.6   No results for input(s): LIPASE, AMYLASE in the last 168 hours. No results for input(s): AMMONIA in the last 168 hours. Coagulation Profile: No results for input(s): INR, PROTIME in the last 168 hours. Cardiac Enzymes: No results for input(s): CKTOTAL, CKMB, CKMBINDEX, TROPONINI in the last 168 hours. BNP (last 3 results) Recent Labs    12/03/23 1607  PROBNP 774.0*   HbA1C: No results for input(s): HGBA1C in the last 72 hours. CBG: Recent Labs  Lab 08/19/24 1146  GLUCAP 154*   Lipid Profile: No results for input(s): CHOL, HDL, LDLCALC, TRIG, CHOLHDL, LDLDIRECT in the last 72 hours. Thyroid  Function Tests: Recent Labs    08/20/24 0542  TSH 12.000*   Anemia Panel: Recent Labs    08/20/24 0542  VITAMINB12 811  FOLATE 17.4  FERRITIN 119  TIBC 272  IRON 45  RETICCTPCT 1.2   Urine analysis:    Component Value Date/Time   COLORURINE YELLOW 08/19/2024 1404   APPEARANCEUR HAZY (A) 08/19/2024 1404   APPEARANCEUR Clear 04/22/2018 1608   LABSPEC 1.009 08/19/2024 1404   PHURINE 6.0 08/19/2024 1404   GLUCOSEU NEGATIVE 08/19/2024 1404   HGBUR SMALL (A) 08/19/2024 1404   BILIRUBINUR NEGATIVE 08/19/2024 1404   BILIRUBINUR Negative 04/22/2018 1608   KETONESUR NEGATIVE 08/19/2024 1404   PROTEINUR NEGATIVE 08/19/2024 1404   UROBILINOGEN 0.2 07/30/2014 2003   NITRITE NEGATIVE 08/19/2024 1404   LEUKOCYTESUR LARGE (A) 08/19/2024 1404   Sepsis Labs: @LABRCNTIP (procalcitonin:4,lacticidven:4)  )No results found for this or any previous visit (from the past 240 hours).       Radiology Studies: CT Cervical Spine Wo Contrast Result Date: 08/19/2024 CLINICAL DATA:  Polytrauma from fall. EXAM: CT HEAD WITHOUT CONTRAST CT CERVICAL SPINE WITHOUT  CONTRAST TECHNIQUE: Multidetector CT imaging of the head and cervical spine was performed following the standard protocol without intravenous contrast. Multiplanar CT image reconstructions of the cervical spine were also generated. RADIATION DOSE REDUCTION: This exam was performed according to the departmental dose-optimization program which includes automated exposure control, adjustment of the mA and/or kV according to patient size and/or use of iterative reconstruction technique. COMPARISON:  Head CT 12/17/2023 and cervical spine/head CT 03/24/2022 FINDINGS: CT HEAD FINDINGS Brain: Ventricles, cisterns and other CSF spaces are normal. There is chronic ischemic microvascular disease. There is no mass, mass effect, shift of midline structures or acute hemorrhage. No evidence of acute infarction. Vascular: No hyperdense vessel or unexpected calcification. Skull: Normal. Negative for fracture or focal lesion. Sinuses/Orbits: Orbits are normal. Paranasal sinuses are well developed with no air-fluid levels. Mucous retention cyst over the right maxillary sinus unchanged. Other: None. CT  CERVICAL SPINE FINDINGS Alignment: Normal. Skull base and vertebrae: Mild to moderate spondylosis throughout the cervical spine to include uncovertebral joint spurring and facet arthropathy. Vertebral body heights are maintained. Atlantoaxial articulation is unremarkable. Mild left-sided neural foraminal narrowing at the C4-5 level with bilateral neural foraminal narrowing at the C5-6 level and mild left-sided neural foraminal narrowing at the C6-7 level. Soft tissues and spinal canal: Prevertebral soft tissues are normal. There is mild canal stenosis at the C5-6 level predominant due to posterior spurring. Disc levels: Disc space narrowing from the C4-5 level to the C6-7 level. Upper chest: No acute findings. Other: None. IMPRESSION: 1. No acute brain injury. 2. Chronic ischemic microvascular disease. 3. No acute cervical spine injury.  4. Mild to moderate spondylosis throughout the cervical spine with disc disease from the C4-5 level to the C6-7 level. Mild canal stenosis at the C5-6 level predominant due to posterior spurring. Electronically Signed   By: Toribio Agreste M.D.   On: 08/19/2024 14:57   CT Head Wo Contrast Result Date: 08/19/2024 CLINICAL DATA:  Polytrauma from fall. EXAM: CT HEAD WITHOUT CONTRAST CT CERVICAL SPINE WITHOUT CONTRAST TECHNIQUE: Multidetector CT imaging of the head and cervical spine was performed following the standard protocol without intravenous contrast. Multiplanar CT image reconstructions of the cervical spine were also generated. RADIATION DOSE REDUCTION: This exam was performed according to the departmental dose-optimization program which includes automated exposure control, adjustment of the mA and/or kV according to patient size and/or use of iterative reconstruction technique. COMPARISON:  Head CT 12/17/2023 and cervical spine/head CT 03/24/2022 FINDINGS: CT HEAD FINDINGS Brain: Ventricles, cisterns and other CSF spaces are normal. There is chronic ischemic microvascular disease. There is no mass, mass effect, shift of midline structures or acute hemorrhage. No evidence of acute infarction. Vascular: No hyperdense vessel or unexpected calcification. Skull: Normal. Negative for fracture or focal lesion. Sinuses/Orbits: Orbits are normal. Paranasal sinuses are well developed with no air-fluid levels. Mucous retention cyst over the right maxillary sinus unchanged. Other: None. CT CERVICAL SPINE FINDINGS Alignment: Normal. Skull base and vertebrae: Mild to moderate spondylosis throughout the cervical spine to include uncovertebral joint spurring and facet arthropathy. Vertebral body heights are maintained. Atlantoaxial articulation is unremarkable. Mild left-sided neural foraminal narrowing at the C4-5 level with bilateral neural foraminal narrowing at the C5-6 level and mild left-sided neural foraminal narrowing  at the C6-7 level. Soft tissues and spinal canal: Prevertebral soft tissues are normal. There is mild canal stenosis at the C5-6 level predominant due to posterior spurring. Disc levels: Disc space narrowing from the C4-5 level to the C6-7 level. Upper chest: No acute findings. Other: None. IMPRESSION: 1. No acute brain injury. 2. Chronic ischemic microvascular disease. 3. No acute cervical spine injury. 4. Mild to moderate spondylosis throughout the cervical spine with disc disease from the C4-5 level to the C6-7 level. Mild canal stenosis at the C5-6 level predominant due to posterior spurring. Electronically Signed   By: Toribio Agreste M.D.   On: 08/19/2024 14:57   CT T-SPINE NO CHARGE Result Date: 08/19/2024 CLINICAL DATA:  Fall a couple days ago at work with back and rib pain. EXAM: CT THORACIC SPINE WITHOUT CONTRAST; CT LUMBAR SPINE WITHOUT CONTRAST TECHNIQUE: Multidetector CT images of the thoracic were obtained using the standard protocol without intravenous contrast. RADIATION DOSE REDUCTION: This exam was performed according to the departmental dose-optimization program which includes automated exposure control, adjustment of the mA and/or kV according to patient size and/or use of iterative reconstruction  technique. COMPARISON:  CT abdomen/pelvis 09/27/2022. FINDINGS: THORACIC SPINE: Alignment: Moderate curvature of the thoracic spine convex left. Vertebrae: Subtle chronic anterior wedging of a an upper thoracic vertebral body. There is no acute compression fracture. There is mild spondylosis throughout the thoracic spine. Schmorl's node of the inferior endplate of a lower thoracic vertebral body. No canal stenosis. No significant neural foraminal narrowing. Paraspinal and other soft tissues: Negative. Disc levels: Multilevel disc space narrowing throughout the thoracic spine. LUMBAR SPINE: Alignment: Curvature of the lumbar spine convex right. No subluxation/spondylolisthesis. Vertebrae: Moderate  spondylosis throughout the lumbar spine to include facet arthropathy. Very subtle depression of the L3 superior endplate compared to the prior exam. This finding is likely not acute although is indeterminate. Paraspinal and other soft tissues: Negative. Disc levels: Disc space narrowing at all levels of the lumbar spine with relative sparing of the L2-3 level. Mild right-sided neural foraminal narrowing at the L2-3 level. Left-sided neural foraminal narrowing at the L3-4 level. Left-sided neural from narrowing at the L4-5 level. No significant canal stenosis. Extra-spinal structures will be discussed on patient's CT of the chest, abdomen and pelvis. IMPRESSION: 1. No acute compression fracture of the thoracic spine. 2. Very subtle depression of the L3 superior endplate compared to the prior exam. This finding is likely not acute, although is indeterminate. 3. Moderate spondylosis throughout the thoracic and lumbar spine with multilevel disc disease and neural foraminal narrowing as described. No significant canal stenosis. Electronically Signed   By: Toribio Agreste M.D.   On: 08/19/2024 14:47   CT L-SPINE NO CHARGE Result Date: 08/19/2024 CLINICAL DATA:  Fall a couple days ago at work with back and rib pain. EXAM: CT THORACIC SPINE WITHOUT CONTRAST; CT LUMBAR SPINE WITHOUT CONTRAST TECHNIQUE: Multidetector CT images of the thoracic were obtained using the standard protocol without intravenous contrast. RADIATION DOSE REDUCTION: This exam was performed according to the departmental dose-optimization program which includes automated exposure control, adjustment of the mA and/or kV according to patient size and/or use of iterative reconstruction technique. COMPARISON:  CT abdomen/pelvis 09/27/2022. FINDINGS: THORACIC SPINE: Alignment: Moderate curvature of the thoracic spine convex left. Vertebrae: Subtle chronic anterior wedging of a an upper thoracic vertebral body. There is no acute compression fracture. There is  mild spondylosis throughout the thoracic spine. Schmorl's node of the inferior endplate of a lower thoracic vertebral body. No canal stenosis. No significant neural foraminal narrowing. Paraspinal and other soft tissues: Negative. Disc levels: Multilevel disc space narrowing throughout the thoracic spine. LUMBAR SPINE: Alignment: Curvature of the lumbar spine convex right. No subluxation/spondylolisthesis. Vertebrae: Moderate spondylosis throughout the lumbar spine to include facet arthropathy. Very subtle depression of the L3 superior endplate compared to the prior exam. This finding is likely not acute although is indeterminate. Paraspinal and other soft tissues: Negative. Disc levels: Disc space narrowing at all levels of the lumbar spine with relative sparing of the L2-3 level. Mild right-sided neural foraminal narrowing at the L2-3 level. Left-sided neural foraminal narrowing at the L3-4 level. Left-sided neural from narrowing at the L4-5 level. No significant canal stenosis. Extra-spinal structures will be discussed on patient's CT of the chest, abdomen and pelvis. IMPRESSION: 1. No acute compression fracture of the thoracic spine. 2. Very subtle depression of the L3 superior endplate compared to the prior exam. This finding is likely not acute, although is indeterminate. 3. Moderate spondylosis throughout the thoracic and lumbar spine with multilevel disc disease and neural foraminal narrowing as described. No significant canal stenosis. Electronically Signed  By: Toribio Agreste M.D.   On: 08/19/2024 14:47   CT CHEST ABDOMEN PELVIS W CONTRAST Result Date: 08/19/2024 EXAM: CT CHEST, ABDOMEN AND PELVIS WITH CONTRAST 08/19/2024 01:57:43 PM TECHNIQUE: CT of the chest, abdomen and pelvis was performed with the administration of 80 mL of iohexol  (OMNIPAQUE ) 300 MG/ML solution. Multiplanar reformatted images are provided for review. Automated exposure control, iterative reconstruction, and/or weight based  adjustment of the mA/kV was utilized to reduce the radiation dose to as low as reasonably achievable. COMPARISON: 09/27/2022 CLINICAL HISTORY: Polytrauma, blunt Back and rib pain FINDINGS: CHEST: MEDIASTINUM AND LYMPH NODES: Mild to moderate cardiomegaly. Multivessel coronary atherosclerosis. The central airways are clear. No mediastinal, hilar or axillary lymphadenopathy. LUNGS AND PLEURA: Mild intralobular septal thickening with hazy ground glass attenuation within the lungs. Elevation of the right hemidiaphragm. Posterior bibasilar dependent atelectasis. A few scattered areas of bullous change noted in the lower lobes. Biapical pleural parenchymal scarring. Small bilateral pleural effusions with bibasilar compressive atelectasis. No pneumothorax. ABDOMEN AND PELVIS: LIVER: Mild diffuse hepatic steatosis. 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 GALLBLADDER AND BILE DUCTS: Unremarkable. No biliary ductal dilatation. SPLEEN: No acute abnormality. PANCREAS: No acute abnormality. ADRENAL GLANDS: No acute abnormality. KIDNEYS, URETERS AND BLADDER: Small nonobstructive right lower pole calculus. No stones in the left kidney or ureters. No hydronephrosis. No perinephric or periureteral stranding. The urinary bladder is distended without focal abnormality. GI AND BOWEL: The stomach is completely decompressed. There is no bowel obstruction. Decompressed noninflamed appendix. REPRODUCTIVE ORGANS: Age related atrophy of the uterus and ovaries. PERITONEUM AND RETROPERITONEUM: No ascites. No free air. VASCULATURE: Atherosclerosis throughout the aorta. Calcified atherosclerosis diffusely throughout the aorta iliac system. Aorta is normal in caliber. ABDOMINAL AND PELVIS LYMPH NODES: No lymphadenopathy. BONES AND SOFT TISSUES: Diffuse osteopenia. Exaggerated thoracic kyphosis with multilevel degenerative disc disease of the spine. Mild dextrocurvature of the thoracolumbar spine.  External female catheter device noted. No acute osseous abnormality. No focal soft tissue abnormality. IMPRESSION: 1. No acute, traumatic injury within the chest, abdomen, or pelvis. 2. Cardiomegaly with findings of mild pulmonary edema and small bilateral pleural effusions. 3. Small nonobstructive right lower pole calyceal calculus. No hydronephrosis in either kidney. Electronically signed by: Rogelia Myers MD MD 08/19/2024 02:47 PM EST RP Workstation: [id number]        Scheduled Meds:  carbidopa -levodopa   1 tablet Oral TID   docusate sodium   100 mg Oral BID   enoxaparin  (LOVENOX ) injection  40 mg Subcutaneous Q24H   folic acid   1 mg Oral Daily   levothyroxine   75 mcg Oral Q0600   lidocaine   1 patch Transdermal Q24H   multivitamin with minerals  1 tablet Oral Daily   senna  1 tablet Oral BID   thiamine   100 mg Oral Daily   Continuous Infusions:  sodium chloride  75 mL/hr at 08/20/24 0937   cefTRIAXone  (ROCEPHIN )  IV 1 g (08/20/24 0919)     LOS: 0 days    Time spent: 35 minutes    Leatrice Chapel, MD  [facility] 7PM-7AM contact night coverage as above    "

## 2024-08-20 NOTE — Evaluation (Signed)
 Physical Therapy Evaluation Patient Details Name: Regina [name] MRN: [id number] DOB: 27-May-1933 Today's Date: 08/20/2024  History of Present Illness  Regina [name] is a 89 y.o. female who fell about 2 days prior to admission.  She was apparently walking with her walker when she got tangled up and fell to the ground.  Complained of pain to the left side of her chest from the impact, blood work was unremarkable but UA was noted to be abnormal.  CT scan of her chest abdomen pelvis and CT head as well as CT cervical spine did not show any acute findings.  Patient was unable to get out of bed even with help.CT of the lumbar spine showed very subtle depression of the L3 superior endplate compared to the prior exam. This finding is likely not acute, although is indeterminate.   PMH: parkinsonism, hypothyroidism, essential hypertension  Clinical Impression  Pt admitted with above diagnosis. Pt not fully oriented, no family present, some information obtained from chart review of previous admission and pt able to personally reports lives with spouse Lynwood, single level home, 2 steps to enter, has RW and rollator she uses, reports haven't done anything since I fell. On eval, pt with generalized weakness, needing cues for safety and sequencing with mobility, requiring increased time to follow cues. Pt needing max A+2 with bedpad to mobilize to bedside, reporting pain throughout L side so encouraged to utilize R side to assist. Pt powers to stand from slightly elevated bed with mod A and takes 3-4 shuffling steps to recliner at bedside with mod A, therapist steadying and assisting with RW management. Patient will benefit from continued inpatient follow up therapy, <3 hours/day. Pt currently with functional limitations due to the deficits listed below (see PT Problem List). Pt will benefit from acute skilled PT to increase their independence and safety with mobility to allow discharge.           If plan is discharge  home, recommend the following: A lot of help with walking and/or transfers;A lot of help with bathing/dressing/bathroom;Assistance with cooking/housework;Assist for transportation;Help with stairs or ramp for entrance;Supervision due to cognitive status   Can travel by private vehicle   No    Equipment Recommendations None recommended by PT  Recommendations for Other Services       Functional Status Assessment Patient has had a recent decline in their functional status and demonstrates the ability to make significant improvements in function in a reasonable and predictable amount of time.     Precautions / Restrictions Precautions Precautions: Fall Recall of Precautions/Restrictions: Impaired Restrictions Weight Bearing Restrictions Per Provider Order: No      Mobility  Bed Mobility Overal bed mobility: Needs Assistance Bed Mobility: Supine to Sit     Supine to sit: Max assist, +2 for safety/equipment     General bed mobility comments: max A+2 to mobilize to bedside, using bedpad, increased time and cues to utilize R side that is less painful    Transfers Overall transfer level: Needs assistance Equipment used: Rolling walker (2 wheels) Transfers: Sit to/from Stand, Bed to chair/wheelchair/BSC Sit to Stand: Mod assist, From elevated surface   Step pivot transfers: Mod assist       General transfer comment: mod A to power up from bedside, pulls from braced RW and take 3-4 steps over to recliner at bedside, trunk forward flexed, slow shuffling steps wtih assist for RW management    Ambulation/Gait  Stairs            Wheelchair Mobility     Tilt Bed    Modified Rankin (Stroke Patients Only)       Balance Overall balance assessment: Needs assistance Sitting-balance support: Feet supported, Bilateral upper extremity supported Sitting balance-Leahy Scale: Poor Sitting balance - Comments: intermittent min A and cues to place feet  on floor and shift trunk forward Postural control: Posterior lean Standing balance support: During functional activity, Reliant on assistive device for balance, Bilateral upper extremity supported Standing balance-Leahy Scale: Poor                               Pertinent Vitals/Pain Pain Assessment Pain Assessment: Faces Faces Pain Scale: Hurts whole lot Pain Location: L sidebody and generalized with movement Pain Descriptors / Indicators: Discomfort, Grimacing, Guarding Pain Intervention(s): Limited activity within patient's tolerance, Monitored during session, Repositioned, Relaxation    Home Living Family/patient expects to be discharged to:: Private residence Living Arrangements: Spouse/significant other Available Help at Discharge: Family Type of Home: House Home Access: Stairs to enter Entrance Stairs-Rails: None Entrance Stairs-Number of Steps: 2   Home Layout: One level Home Equipment: Rollator (4 wheels);Rolling Walker (2 wheels);Shower seat;Grab bars - tub/shower Additional Comments: information above obtained from previous admission; pt endorses home in single level home, 2 steps, spouse present, has RW and rollator on this date eval    Prior Function Prior Level of Function : Patient poor historian/Family not available             Mobility Comments: pt reports has RW and rollator to use, endorses falls, haven't done anything since I fell       Extremity/Trunk Assessment   Upper Extremity Assessment Upper Extremity Assessment: Defer to OT evaluation    Lower Extremity Assessment Lower Extremity Assessment: Generalized weakness (AROM WFL, strength grossly 3+/5, denies numbness/tingling)    Cervical / Trunk Assessment Cervical / Trunk Assessment: Kyphotic  Communication   Communication Communication: Impaired Factors Affecting Communication: Hearing impaired    Cognition Arousal: Alert Behavior During Therapy: WFL for tasks  assessed/performed   PT - Cognitive impairments: No family/caregiver present to determine baseline                       PT - Cognition Comments: pt states name and DOB appropriately, reports current date 12/25, able to correct to January with cues and assistance, slow to respond to questions and cues Following commands: Impaired Following commands impaired: Follows one step commands with increased time     Cueing Cueing Techniques: Verbal cues, Gestural cues, Visual cues     General Comments      Exercises     Assessment/Plan    PT Assessment Patient needs continued PT services  PT Problem List Decreased strength;Decreased activity tolerance;Decreased balance;Decreased mobility;Decreased cognition;Decreased knowledge of use of DME;Decreased safety awareness;Pain       PT Treatment Interventions DME instruction;Gait training;Functional mobility training;Therapeutic activities;Therapeutic exercise;Balance training;Neuromuscular re-education;Cognitive remediation;Patient/family education    PT Goals (Current goals can be found in the Care Plan section)  Acute Rehab PT Goals Patient Stated Goal: less pain PT Goal Formulation: With patient Time For Goal Achievement: 09/03/24 Potential to Achieve Goals: Good    Frequency Min 2X/week     Co-evaluation PT/OT/SLP Co-Evaluation/Treatment: Yes Reason for Co-Treatment: For patient/therapist safety;To address functional/ADL transfers PT goals addressed during session: Mobility/safety with mobility;Balance;Proper use of DME;Strengthening/ROM  AM-PAC PT 6 Clicks Mobility  Outcome Measure Help needed turning from your back to your side while in a flat bed without using bedrails?: A Lot Help needed moving from lying on your back to sitting on the side of a flat bed without using bedrails?: A Lot Help needed moving to and from a bed to a chair (including a wheelchair)?: A Lot Help needed standing up from a chair  using your arms (e.g., wheelchair or bedside chair)?: A Lot Help needed to walk in hospital room?: A Lot Help needed climbing 3-5 steps with a railing? : Total 6 Click Score: 11    End of Session Equipment Utilized During Treatment: Gait belt Activity Tolerance: Patient tolerated treatment well;Patient limited by pain Patient left: in chair;with call bell/phone within reach;with chair alarm set;Other (comment) (OT in room) Nurse Communication: Mobility status PT Visit Diagnosis: Unsteadiness on feet (R26.81);Other abnormalities of gait and mobility (R26.89);Muscle weakness (generalized) (M62.81);Pain Pain - Right/Left: Left    Time: 9175-9155 PT Time Calculation (min) (ACUTE ONLY): 20 min   Charges:   PT Evaluation $PT Eval Moderate Complexity: 1 Mod   PT General Charges $$ ACUTE PT VISIT: 1 Visit         Tori Mahmood Boehringer PT, DPT 08/20/2024, 8:57 AM

## 2024-08-20 NOTE — Evaluation (Signed)
 Occupational Therapy Evaluation Patient Details Name: Regina [name] MRN: [id number] DOB: 1933/08/03 Today's Date: 08/20/2024   History of Present Illness   Regina [name] is a 89 y.o. female who fell about 2 days prior to admission.  She was apparently walking with her walker when she got tangled up and fell to the ground.  Complained of pain to the left side of her chest from the impact, blood work was unremarkable but UA was noted to be abnormal.  CT scan of her chest abdomen pelvis and CT head as well as CT cervical spine did not show any acute findings.  Patient was unable to get out of bed even with help.CT of the lumbar spine showed very subtle depression of the L3 superior endplate compared to the prior exam. This finding is likely not acute, although is indeterminate.   PMH: parkinsonism, hypothyroidism, essential hypertension     Clinical Impressions PTA, patient lives with husband who provides some assist with ADL/IADL's and was using RW for mobility when she fell. Husband not bedside and patient with decreased recall/historian. Patient was open to all therapy and cooperative with assessment. Currently, patient presents with deficits outlined below (see OT Problem List for details) most significantly pain, decreased cognition, L UE ROM and coordination, decreased muscle strength, balance and activity tolerance for BADL's (max-total A LB) and functional mobility (mod A SPT RW) performance. Patient will benefit from continued inpatient follow up therapy, <3 hours/day. Patient requires continued Acute care hospital level OT services to progress safety and functional performance and allow for discharge.       If plan is discharge home, recommend the following:   Two people to help with walking and/or transfers;A lot of help with bathing/dressing/bathroom;Assistance with feeding;Assistance with cooking/housework;Direct supervision/assist for medications management;Direct supervision/assist for  financial management;Assist for transportation;Help with stairs or ramp for entrance;Supervision due to cognitive status     Functional Status Assessment   Patient has had a recent decline in their functional status and demonstrates the ability to make significant improvements in function in a reasonable and predictable amount of time.     Equipment Recommendations   None recommended by OT      Precautions/Restrictions   Precautions Precautions: Fall Recall of Precautions/Restrictions: Impaired Restrictions Weight Bearing Restrictions Per Provider Order: No     Mobility Bed Mobility Overal bed mobility: Needs Assistance Bed Mobility: Supine to Sit     Supine to sit: Max assist, +2 for safety/equipment     General bed mobility comments: max A+2 to mobilize to bedside, using bedpad, increased time and cues to utilize R side that is less painful    Transfers Overall transfer level: Needs assistance Equipment used: Rolling walker (2 wheels) Transfers: Sit to/from Stand, Bed to chair/wheelchair/BSC Sit to Stand: Mod assist, From elevated surface     Step pivot transfers: Mod assist     General transfer comment: mod A to power up from bedside, pulls from braced RW and take 3-4 steps over to recliner at bedside, trunk forward flexed, slow shuffling steps wtih assist for RW management      Balance Overall balance assessment: Needs assistance Sitting-balance support: Feet supported, Bilateral upper extremity supported Sitting balance-Leahy Scale: Poor Sitting balance - Comments: intermittent min A and cues to place feet on floor and shift trunk forward Postural control: Posterior lean Standing balance support: During functional activity, Reliant on assistive device for balance, Bilateral upper extremity supported Standing balance-Leahy Scale: Poor  ADL either performed or assessed with clinical judgement   ADL Overall ADL's  : Needs assistance/impaired Eating/Feeding: Set up;Sitting   Grooming: Wash/dry hands;Wash/dry face;Oral care;Set up;Sitting;Cueing for sequencing   Upper Body Bathing: Moderate assistance;Sitting   Lower Body Bathing: Maximal assistance;Sit to/from stand;Cueing for sequencing;Cueing for safety   Upper Body Dressing : Moderate assistance;Sitting;Cueing for sequencing   Lower Body Dressing: Total assistance;Sit to/from stand;Cueing for sequencing;Cueing for safety;Maximal assistance   Toilet Transfer: Moderate assistance;BSC/3in1;Stand-pivot;Cueing for safety;Cueing for sequencing   Toileting- Clothing Manipulation and Hygiene: Maximal assistance;Sitting/lateral lean;Total assistance Toileting - Clothing Manipulation Details (indicate cue type and reason): using Depends and Purewick due to voiding incontinence     Functional mobility during ADLs: Moderate assistance;Rolling walker (2 wheels);Cueing for safety;Cueing for sequencing General ADL Comments: decreased reach to lower body, painful L side     Vision Baseline Vision/History: 1 Wears glasses Ability to See in Adequate Light: 0 Adequate Patient Visual Report: No change from baseline Vision Assessment?: Wears glasses for reading Additional Comments: maintains eyes closed at times but denies light sensitivity, able to open B eyes, track during functioanl tasks with no apparent deficits observed or reported     Perception Perception: Not tested       Praxis Praxis: Impaired Praxis Impairment Details: Motor planning, Organization     Pertinent Vitals/Pain Pain Assessment Pain Assessment: Faces Faces Pain Scale: Hurts whole lot Pain Location: L sidebody and generalized with movement Pain Descriptors / Indicators: Discomfort, Grimacing, Guarding Pain Intervention(s): Limited activity within patient's tolerance, Monitored during session, Premedicated before session, Repositioned, Relaxation     Extremity/Trunk Assessment  Upper Extremity Assessment Upper Extremity Assessment: Generalized weakness;Right hand dominant;LUE deficits/detail LUE Deficits / Details: pain with > 0-100 degrees shoulder flexion more from L rib region, tenderness distally as well with full distal ROM but discoloration and soreness at IV side on L wrist dorsum, + grasp release L hand LUE Coordination: decreased fine motor   Lower Extremity Assessment Lower Extremity Assessment: Defer to PT evaluation   Cervical / Trunk Assessment Cervical / Trunk Assessment: Kyphotic   Communication Communication Communication: Impaired Factors Affecting Communication: Hearing impaired   Cognition Arousal: Alert Behavior During Therapy: WFL for tasks assessed/performed Cognition: History of cognitive impairments             OT - Cognition Comments: no family at bedside but patient alaert Ox2-3 not exact date but relatively clear re what led up to hospitalization, decreased STM, insight, judgement and organization, decreased divided attention and problem solving                 Following commands: Impaired Following commands impaired: Follows one step commands with increased time     Cueing  General Comments   Cueing Techniques: Verbal cues;Gestural cues;Visual cues  on RA with no SOB, L UE discoloration distally with IV side, B LE +1-2 edema and skin dryness and minor closed/dry blisters           Home Living Family/patient expects to be discharged to:: Private residence Living Arrangements: Spouse/significant other Available Help at Discharge: Family Type of Home: House Home Access: Stairs to enter Secretary/administrator of Steps: 2 Entrance Stairs-Rails: None Home Layout: One level     Bathroom Shower/Tub: Producer, Television/film/video: Handicapped height     Home Equipment: Rollator (4 wheels);Rolling Walker (2 wheels);Shower seat;Grab bars - tub/shower   Additional Comments: information above obtained from  previous admission; pt endorses home in single level home, 2 steps,  spouse present, has RW and rollator on this date eval      Prior Functioning/Environment Prior Level of Function : Patient poor historian/Family not available             Mobility Comments: pt reports has RW and rollator to use, endorses falls, haven't done anything since I fell ADLs Comments: Spouse assisting with all ADLs, except self feeding, per spouse pt has purewick at home for toileting    OT Problem List: Decreased strength;Decreased range of motion;Decreased activity tolerance;Impaired balance (sitting and/or standing);Decreased coordination;Decreased cognition;Decreased safety awareness;Decreased knowledge of use of DME or AE;Decreased knowledge of precautions;Impaired UE functional use;Pain;Increased edema   OT Treatment/Interventions: Self-care/ADL training;Therapeutic exercise;Neuromuscular education;Energy conservation;DME and/or AE instruction;Therapeutic activities;Cognitive remediation/compensation;Patient/family education;Balance training      OT Goals(Current goals can be found in the care plan section)   Acute Rehab OT Goals Patient Stated Goal: to get up and eat OT Goal Formulation: With patient Time For Goal Achievement: 09/03/24 Potential to Achieve Goals: Fair ADL Goals Pt Will Perform Grooming: with set-up;sitting Pt Will Perform Upper Body Bathing: with contact guard assist;sitting Pt Will Perform Upper Body Dressing: with contact guard assist;sitting Pt Will Transfer to Toilet: with min assist;ambulating;regular height toilet Pt Will Perform Toileting - Clothing Manipulation and hygiene: with contact guard assist;sitting/lateral leans   OT Frequency:  Min 2X/week    Co-evaluation   Reason for Co-Treatment: For patient/therapist safety;To address functional/ADL transfers PT goals addressed during session: Mobility/safety with mobility;Balance;Proper use of DME;Strengthening/ROM         AM-PAC OT 6 Clicks Daily Activity     Outcome Measure Help from another person eating meals?: A Little Help from another person taking care of personal grooming?: A Little Help from another person toileting, which includes using toliet, bedpan, or urinal?: Total Help from another person bathing (including washing, rinsing, drying)?: A Lot Help from another person to put on and taking off regular upper body clothing?: A Lot Help from another person to put on and taking off regular lower body clothing?: Total 6 Click Score: 12   End of Session Equipment Utilized During Treatment: Gait belt;Rolling walker (2 wheels) Nurse Communication: Mobility status  Activity Tolerance: Patient limited by pain Patient left: in chair;with call bell/phone within reach;with chair alarm set  OT Visit Diagnosis: Unsteadiness on feet (R26.81);History of falling (Z91.81);Muscle weakness (generalized) (M62.81);Cognitive communication deficit (R41.841);Pain Pain - Right/Left: Left Pain - part of body: Arm (rib region)                Time: 9175-9155 OT Time Calculation (min): 20 min Charges:  OT General Charges $OT Visit: 1 Visit OT Evaluation $OT Eval Moderate Complexity: 1 Mod  Marcea Rojek OT/L Acute Rehabilitation Department  [phone]  08/20/2024, 9:21 AM

## 2024-08-21 ENCOUNTER — Encounter (HOSPITAL_COMMUNITY): Payer: Self-pay | Admitting: Internal Medicine

## 2024-08-21 DIAGNOSIS — R262 Difficulty in walking, not elsewhere classified: Secondary | ICD-10-CM | POA: Diagnosis not present

## 2024-08-21 NOTE — Plan of Care (Signed)
?  Problem: Nutrition: Goal: Adequate nutrition will be maintained Outcome: Progressing   Problem: Activity: Goal: Risk for activity intolerance will decrease Outcome: Not Progressing   

## 2024-08-21 NOTE — Progress Notes (Signed)
 " PROGRESS NOTE    Regina [name]  FMW:[id number] DOB: Mar 13, 1933 DOA: 08/19/2024 PCP: Valma Carwin, MD  Outpatient Specialists:     Brief Narrative:  Patient is an 89 year old female with past medical history significant for parkinsonism, hypertension and hypothyroidism.  Patient was admitted with back pain, inability to ambulate following recent fall.  On presentation to the emergency room, blood work was unremarkable but UA was noted to be abnormal. CT scan of her chest abdomen pelvis and CT head as well as CT cervical spine did not show any acute findings. Patient was unable to get out of bed even with help.  Husband is unable to take care of patient.  Pain remains uncontrolled.  08/20/2024: Patient seen alongside patient's husband.  Pain remains well-controlled.  Specifically, patient reports pain around the mid, lateral rib cage area.  Will apply lidocaine  patch.  Continue oxycodone  5 mg every 4 hours as needed.  Will start patient on OxyContin  10 mg p.o. twice daily.  08/21/2024: Patient seen.  Pain is slowly improving.  Pursue disposition.  SNF is recommended.   Assessment & Plan:   Principal Problem:   Ambulatory dysfunction Active Problems:   Chronic diastolic heart failure (HCC)   Essential hypertension   Parkinsonism (HCC)   Back pain   Musculoskeletal pain secondary to fall -Patient reports pain in the left chest area as well as her lower back.  - No significant findings on imaging studies.  CT of the lumbar spine showed very subtle depression of the L3 superior endplate compared to the prior exam. This finding is likely not acute, although is indeterminate. -Optimize pain control. - PT/OT input. 08/22/2023: Pursue disposition.   Possible urinary tract infection: -No urine culture visualized. - Patient has completed 3-day course of Rocephin .       Parkinsonism Followed by Dr. Buck with neurology.   Continue with Sinemet . Parkinsonism may be contributing to patient's  falls.   Macrocytic anemia -Vitamin B12 level of 811. - Folate level of 17.4. - Iron of 45, ferritin of 119, TIBC of 272 and saturation ratio of 16.  - Depending on goals of care, may consider referral to hematology team on discharge to rule out myelodysplastic syndrome.   Chronic kidney disease stage IIIa/b -Currently at baseline. - Serum creatinine of 1.14 today, with eGFR of 46 mL/min per 1.73 m.     DVT prophylaxis: Subcutaneous Lovenox  Code Status: DO NOT RESUSCITATE Family Communication: Husband by bedside Disposition Plan: Admit to inpatient   Consultants:  None.  Procedures:  None.  Antimicrobials:  Patient has completed 3 course of IV Rocephin  for possible UTI.   Subjective: Pain control has improved.  Objective: Vitals:   08/20/24 0603 08/20/24 1752 08/20/24 2316 08/21/24 0617  BP: (!) 147/41 (!) 157/56 (!) 170/57 (!) 136/44  Pulse: 73 85 79 64  Resp: 18 16 16 15   Temp: 97.8 F (36.6 C) 98.7 F (37.1 C) 98.9 F (37.2 C) 97.7 F (36.5 C)  TempSrc: Oral  Oral Oral  SpO2: 93% 98% 96% 95%  Weight:      Height:        Intake/Output Summary (Last 24 hours) at 08/21/2024 0837 Last data filed at 08/21/2024 0600 Gross per 24 hour  Intake 1434.72 ml  Output --  Net 1434.72 ml   Filed Weights   08/19/24 1206 08/19/24 1944  Weight: 65.8 kg 64.1 kg    Examination:  General exam: Patient is awake and alert.  Patient is in painful distress.  Patient is pale.   Respiratory system: Clear to auscultation. Respiratory effort normal. Cardiovascular system: S1 & S2 heard Gastrointestinal system: Abdomen is soft and nontender.  Central nervous system: Awake and alert.     Data Reviewed: I have personally reviewed following labs and imaging studies  CBC: Recent Labs  Lab 08/19/24 1153 08/20/24 0542  WBC 9.7 9.7  HGB 10.6* 10.1*  HCT 32.8* 30.3*  MCV 102.2* 100.3*  PLT 210 198   Basic Metabolic Panel: Recent Labs  Lab 08/19/24 1153 08/20/24 0542   NA 135 135  K 4.3 4.3  CL 99 100  CO2 27 25  GLUCOSE 138* 71  BUN 25* 22  CREATININE 1.18* 1.14*  CALCIUM 9.8 9.2   GFR: Estimated Creatinine Clearance: 31.3 mL/min (A) (by C-G formula based on SCr of 1.14 mg/dL (H)). Liver Function Tests: Recent Labs  Lab 08/19/24 1153  AST 22  ALT <5  ALKPHOS 77  BILITOT 0.6  PROT 6.5  ALBUMIN 3.6   No results for input(s): LIPASE, AMYLASE in the last 168 hours. No results for input(s): AMMONIA in the last 168 hours. Coagulation Profile: No results for input(s): INR, PROTIME in the last 168 hours. Cardiac Enzymes: No results for input(s): CKTOTAL, CKMB, CKMBINDEX, TROPONINI in the last 168 hours. BNP (last 3 results) Recent Labs    12/03/23 1607  PROBNP 774.0*   HbA1C: No results for input(s): HGBA1C in the last 72 hours. CBG: Recent Labs  Lab 08/19/24 1146  GLUCAP 154*   Lipid Profile: No results for input(s): CHOL, HDL, LDLCALC, TRIG, CHOLHDL, LDLDIRECT in the last 72 hours. Thyroid  Function Tests: Recent Labs    08/20/24 0542  TSH 12.000*   Anemia Panel: Recent Labs    08/20/24 0542  VITAMINB12 811  FOLATE 17.4  FERRITIN 119  TIBC 272  IRON 45  RETICCTPCT 1.2   Urine analysis:    Component Value Date/Time   COLORURINE YELLOW 08/19/2024 1404   APPEARANCEUR HAZY (A) 08/19/2024 1404   APPEARANCEUR Clear 04/22/2018 1608   LABSPEC 1.009 08/19/2024 1404   PHURINE 6.0 08/19/2024 1404   GLUCOSEU NEGATIVE 08/19/2024 1404   HGBUR SMALL (A) 08/19/2024 1404   BILIRUBINUR NEGATIVE 08/19/2024 1404   BILIRUBINUR Negative 04/22/2018 1608   KETONESUR NEGATIVE 08/19/2024 1404   PROTEINUR NEGATIVE 08/19/2024 1404   UROBILINOGEN 0.2 07/30/2014 2003   NITRITE NEGATIVE 08/19/2024 1404   LEUKOCYTESUR LARGE (A) 08/19/2024 1404   Sepsis Labs: @LABRCNTIP (procalcitonin:4,lacticidven:4)  )No results found for this or any previous visit (from the past 240 hours).       Radiology  Studies: CT Cervical Spine Wo Contrast Result Date: 08/19/2024 CLINICAL DATA:  Polytrauma from fall. EXAM: CT HEAD WITHOUT CONTRAST CT CERVICAL SPINE WITHOUT CONTRAST TECHNIQUE: Multidetector CT imaging of the head and cervical spine was performed following the standard protocol without intravenous contrast. Multiplanar CT image reconstructions of the cervical spine were also generated. RADIATION DOSE REDUCTION: This exam was performed according to the departmental dose-optimization program which includes automated exposure control, adjustment of the mA and/or kV according to patient size and/or use of iterative reconstruction technique. COMPARISON:  Head CT 12/17/2023 and cervical spine/head CT 03/24/2022 FINDINGS: CT HEAD FINDINGS Brain: Ventricles, cisterns and other CSF spaces are normal. There is chronic ischemic microvascular disease. There is no mass, mass effect, shift of midline structures or acute hemorrhage. No evidence of acute infarction. Vascular: No hyperdense vessel or unexpected calcification. Skull: Normal. Negative for fracture or focal lesion. Sinuses/Orbits: Orbits are normal. Paranasal  sinuses are well developed with no air-fluid levels. Mucous retention cyst over the right maxillary sinus unchanged. Other: None. CT CERVICAL SPINE FINDINGS Alignment: Normal. Skull base and vertebrae: Mild to moderate spondylosis throughout the cervical spine to include uncovertebral joint spurring and facet arthropathy. Vertebral body heights are maintained. Atlantoaxial articulation is unremarkable. Mild left-sided neural foraminal narrowing at the C4-5 level with bilateral neural foraminal narrowing at the C5-6 level and mild left-sided neural foraminal narrowing at the C6-7 level. Soft tissues and spinal canal: Prevertebral soft tissues are normal. There is mild canal stenosis at the C5-6 level predominant due to posterior spurring. Disc levels: Disc space narrowing from the C4-5 level to the C6-7 level.  Upper chest: No acute findings. Other: None. IMPRESSION: 1. No acute brain injury. 2. Chronic ischemic microvascular disease. 3. No acute cervical spine injury. 4. Mild to moderate spondylosis throughout the cervical spine with disc disease from the C4-5 level to the C6-7 level. Mild canal stenosis at the C5-6 level predominant due to posterior spurring. Electronically Signed   By: Toribio Agreste M.D.   On: 08/19/2024 14:57   CT Head Wo Contrast Result Date: 08/19/2024 CLINICAL DATA:  Polytrauma from fall. EXAM: CT HEAD WITHOUT CONTRAST CT CERVICAL SPINE WITHOUT CONTRAST TECHNIQUE: Multidetector CT imaging of the head and cervical spine was performed following the standard protocol without intravenous contrast. Multiplanar CT image reconstructions of the cervical spine were also generated. RADIATION DOSE REDUCTION: This exam was performed according to the departmental dose-optimization program which includes automated exposure control, adjustment of the mA and/or kV according to patient size and/or use of iterative reconstruction technique. COMPARISON:  Head CT 12/17/2023 and cervical spine/head CT 03/24/2022 FINDINGS: CT HEAD FINDINGS Brain: Ventricles, cisterns and other CSF spaces are normal. There is chronic ischemic microvascular disease. There is no mass, mass effect, shift of midline structures or acute hemorrhage. No evidence of acute infarction. Vascular: No hyperdense vessel or unexpected calcification. Skull: Normal. Negative for fracture or focal lesion. Sinuses/Orbits: Orbits are normal. Paranasal sinuses are well developed with no air-fluid levels. Mucous retention cyst over the right maxillary sinus unchanged. Other: None. CT CERVICAL SPINE FINDINGS Alignment: Normal. Skull base and vertebrae: Mild to moderate spondylosis throughout the cervical spine to include uncovertebral joint spurring and facet arthropathy. Vertebral body heights are maintained. Atlantoaxial articulation is unremarkable. Mild  left-sided neural foraminal narrowing at the C4-5 level with bilateral neural foraminal narrowing at the C5-6 level and mild left-sided neural foraminal narrowing at the C6-7 level. Soft tissues and spinal canal: Prevertebral soft tissues are normal. There is mild canal stenosis at the C5-6 level predominant due to posterior spurring. Disc levels: Disc space narrowing from the C4-5 level to the C6-7 level. Upper chest: No acute findings. Other: None. IMPRESSION: 1. No acute brain injury. 2. Chronic ischemic microvascular disease. 3. No acute cervical spine injury. 4. Mild to moderate spondylosis throughout the cervical spine with disc disease from the C4-5 level to the C6-7 level. Mild canal stenosis at the C5-6 level predominant due to posterior spurring. Electronically Signed   By: Toribio Agreste M.D.   On: 08/19/2024 14:57   CT T-SPINE NO CHARGE Result Date: 08/19/2024 CLINICAL DATA:  Fall a couple days ago at work with back and rib pain. EXAM: CT THORACIC SPINE WITHOUT CONTRAST; CT LUMBAR SPINE WITHOUT CONTRAST TECHNIQUE: Multidetector CT images of the thoracic were obtained using the standard protocol without intravenous contrast. RADIATION DOSE REDUCTION: This exam was performed according to the departmental dose-optimization program  which includes automated exposure control, adjustment of the mA and/or kV according to patient size and/or use of iterative reconstruction technique. COMPARISON:  CT abdomen/pelvis 09/27/2022. FINDINGS: THORACIC SPINE: Alignment: Moderate curvature of the thoracic spine convex left. Vertebrae: Subtle chronic anterior wedging of a an upper thoracic vertebral body. There is no acute compression fracture. There is mild spondylosis throughout the thoracic spine. Schmorl's node of the inferior endplate of a lower thoracic vertebral body. No canal stenosis. No significant neural foraminal narrowing. Paraspinal and other soft tissues: Negative. Disc levels: Multilevel disc space  narrowing throughout the thoracic spine. LUMBAR SPINE: Alignment: Curvature of the lumbar spine convex right. No subluxation/spondylolisthesis. Vertebrae: Moderate spondylosis throughout the lumbar spine to include facet arthropathy. Very subtle depression of the L3 superior endplate compared to the prior exam. This finding is likely not acute although is indeterminate. Paraspinal and other soft tissues: Negative. Disc levels: Disc space narrowing at all levels of the lumbar spine with relative sparing of the L2-3 level. Mild right-sided neural foraminal narrowing at the L2-3 level. Left-sided neural foraminal narrowing at the L3-4 level. Left-sided neural from narrowing at the L4-5 level. No significant canal stenosis. Extra-spinal structures will be discussed on patient's CT of the chest, abdomen and pelvis. IMPRESSION: 1. No acute compression fracture of the thoracic spine. 2. Very subtle depression of the L3 superior endplate compared to the prior exam. This finding is likely not acute, although is indeterminate. 3. Moderate spondylosis throughout the thoracic and lumbar spine with multilevel disc disease and neural foraminal narrowing as described. No significant canal stenosis. Electronically Signed   By: Toribio Agreste M.D.   On: 08/19/2024 14:47   CT L-SPINE NO CHARGE Result Date: 08/19/2024 CLINICAL DATA:  Fall a couple days ago at work with back and rib pain. EXAM: CT THORACIC SPINE WITHOUT CONTRAST; CT LUMBAR SPINE WITHOUT CONTRAST TECHNIQUE: Multidetector CT images of the thoracic were obtained using the standard protocol without intravenous contrast. RADIATION DOSE REDUCTION: This exam was performed according to the departmental dose-optimization program which includes automated exposure control, adjustment of the mA and/or kV according to patient size and/or use of iterative reconstruction technique. COMPARISON:  CT abdomen/pelvis 09/27/2022. FINDINGS: THORACIC SPINE: Alignment: Moderate curvature of  the thoracic spine convex left. Vertebrae: Subtle chronic anterior wedging of a an upper thoracic vertebral body. There is no acute compression fracture. There is mild spondylosis throughout the thoracic spine. Schmorl's node of the inferior endplate of a lower thoracic vertebral body. No canal stenosis. No significant neural foraminal narrowing. Paraspinal and other soft tissues: Negative. Disc levels: Multilevel disc space narrowing throughout the thoracic spine. LUMBAR SPINE: Alignment: Curvature of the lumbar spine convex right. No subluxation/spondylolisthesis. Vertebrae: Moderate spondylosis throughout the lumbar spine to include facet arthropathy. Very subtle depression of the L3 superior endplate compared to the prior exam. This finding is likely not acute although is indeterminate. Paraspinal and other soft tissues: Negative. Disc levels: Disc space narrowing at all levels of the lumbar spine with relative sparing of the L2-3 level. Mild right-sided neural foraminal narrowing at the L2-3 level. Left-sided neural foraminal narrowing at the L3-4 level. Left-sided neural from narrowing at the L4-5 level. No significant canal stenosis. Extra-spinal structures will be discussed on patient's CT of the chest, abdomen and pelvis. IMPRESSION: 1. No acute compression fracture of the thoracic spine. 2. Very subtle depression of the L3 superior endplate compared to the prior exam. This finding is likely not acute, although is indeterminate. 3. Moderate spondylosis throughout the  thoracic and lumbar spine with multilevel disc disease and neural foraminal narrowing as described. No significant canal stenosis. Electronically Signed   By: Toribio Agreste M.D.   On: 08/19/2024 14:47   CT CHEST ABDOMEN PELVIS W CONTRAST Result Date: 08/19/2024 EXAM: CT CHEST, ABDOMEN AND PELVIS WITH CONTRAST 08/19/2024 01:57:43 PM TECHNIQUE: CT of the chest, abdomen and pelvis was performed with the administration of 80 mL of iohexol   (OMNIPAQUE ) 300 MG/ML solution. Multiplanar reformatted images are provided for review. Automated exposure control, iterative reconstruction, and/or weight based adjustment of the mA/kV was utilized to reduce the radiation dose to as low as reasonably achievable. COMPARISON: 09/27/2022 CLINICAL HISTORY: Polytrauma, blunt Back and rib pain FINDINGS: CHEST: MEDIASTINUM AND LYMPH NODES: Mild to moderate cardiomegaly. Multivessel coronary atherosclerosis. The central airways are clear. No mediastinal, hilar or axillary lymphadenopathy. LUNGS AND PLEURA: Mild intralobular septal thickening with hazy ground glass attenuation within the lungs. Elevation of the right hemidiaphragm. Posterior bibasilar dependent atelectasis. A few scattered areas of bullous change noted in the lower lobes. Biapical pleural parenchymal scarring. Small bilateral pleural effusions with bibasilar compressive atelectasis. No pneumothorax. ABDOMEN AND PELVIS: LIVER: Mild diffuse hepatic steatosis. 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 GALLBLADDER AND BILE DUCTS: Unremarkable. No biliary ductal dilatation. SPLEEN: No acute abnormality. PANCREAS: No acute abnormality. ADRENAL GLANDS: No acute abnormality. KIDNEYS, URETERS AND BLADDER: Small nonobstructive right lower pole calculus. No stones in the left kidney or ureters. No hydronephrosis. No perinephric or periureteral stranding. The urinary bladder is distended without focal abnormality. GI AND BOWEL: The stomach is completely decompressed. There is no bowel obstruction. Decompressed noninflamed appendix. REPRODUCTIVE ORGANS: Age related atrophy of the uterus and ovaries. PERITONEUM AND RETROPERITONEUM: No ascites. No free air. VASCULATURE: Atherosclerosis throughout the aorta. Calcified atherosclerosis diffusely throughout the aorta iliac system. Aorta is normal in caliber. ABDOMINAL AND PELVIS LYMPH NODES: No lymphadenopathy. BONES AND SOFT  TISSUES: Diffuse osteopenia. Exaggerated thoracic kyphosis with multilevel degenerative disc disease of the spine. Mild dextrocurvature of the thoracolumbar spine. External female catheter device noted. No acute osseous abnormality. No focal soft tissue abnormality. IMPRESSION: 1. No acute, traumatic injury within the chest, abdomen, or pelvis. 2. Cardiomegaly with findings of mild pulmonary edema and small bilateral pleural effusions. 3. Small nonobstructive right lower pole calyceal calculus. No hydronephrosis in either kidney. Electronically signed by: Rogelia Myers MD MD 08/19/2024 02:47 PM EST RP Workstation: [id number]        Scheduled Meds:  carbidopa -levodopa   1 tablet Oral TID   docusate sodium   100 mg Oral BID   enoxaparin  (LOVENOX ) injection  40 mg Subcutaneous Q24H   folic acid   1 mg Oral Daily   levothyroxine   75 mcg Oral Q0600   lidocaine   1 patch Transdermal Q24H   multivitamin with minerals  1 tablet Oral Daily   oxyCODONE   10 mg Oral Q12H   senna  1 tablet Oral BID   thiamine   100 mg Oral Daily   Continuous Infusions:  cefTRIAXone  (ROCEPHIN )  IV 1 g (08/20/24 0919)     LOS: 1 day    Time spent: 35 minutes    Leatrice Chapel, MD  [facility] 7PM-7AM contact night coverage as above    "

## 2024-08-22 DIAGNOSIS — R262 Difficulty in walking, not elsewhere classified: Secondary | ICD-10-CM | POA: Diagnosis not present

## 2024-08-22 NOTE — Progress Notes (Signed)
 " PROGRESS NOTE    Regina [name]  FMW:[id number] DOB: 03-Jun-1933 DOA: 08/19/2024 PCP: Valma Carwin, MD  Outpatient Specialists:     Brief Narrative:  Patient is an 89 year old female with past medical history significant for parkinsonism, hypertension and hypothyroidism.  Patient was admitted with back pain, inability to ambulate following recent fall.  On presentation to the emergency room, blood work was unremarkable but UA was noted to be abnormal. CT scan of her chest abdomen pelvis and CT head as well as CT cervical spine did not show any acute findings. Patient was unable to get out of bed even with help.  Husband is unable to take care of patient.  Pain remains uncontrolled.  08/20/2024: Patient seen alongside patient's husband.  Pain remains well-controlled.  Specifically, patient reports pain around the mid, lateral rib cage area.  Will apply lidocaine  patch.  Continue oxycodone  5 mg every 4 hours as needed.  Will start patient on OxyContin  10 mg p.o. twice daily.  08/22/2024: Patient seen.  Pain is continues to improve slowly.  Pursue disposition.  SNF is recommended.   Assessment & Plan:   Principal Problem:   Ambulatory dysfunction Active Problems:   Chronic diastolic heart failure (HCC)   Essential hypertension   Parkinsonism (HCC)   Back pain   Musculoskeletal pain secondary to fall -Patient reports pain in the left chest area as well as her lower back.  - No significant findings on imaging studies.  CT of the lumbar spine showed very subtle depression of the L3 superior endplate compared to the prior exam. This finding is likely not acute, although is indeterminate. -Optimize pain control. - PT/OT input. 08/22/2024: Pursue disposition.   Possible urinary tract infection: -No urine culture visualized. - Patient has completed 3-day course of Rocephin .       Parkinsonism Followed by Dr. Buck with neurology.   Continue with Sinemet . Parkinsonism may be contributing to  patient's falls.   Macrocytic anemia -Vitamin B12 level of 811. - Folate level of 17.4. - Iron of 45, ferritin of 119, TIBC of 272 and saturation ratio of 16.  - Depending on goals of care, may consider referral to hematology team on discharge to rule out myelodysplastic syndrome.   Chronic kidney disease stage IIIa/b -Currently at baseline. - Serum creatinine of 1.14 today, with eGFR of 46 mL/min per 1.73 m.     DVT prophylaxis: Subcutaneous Lovenox  Code Status: DO NOT RESUSCITATE Family Communication: Husband by bedside Disposition Plan: Admit to inpatient   Consultants:  None.  Procedures:  None.  Antimicrobials:  Patient has completed 3 course of IV Rocephin  for possible UTI.   Subjective: Pain control has improving.  Objective: Vitals:   08/21/24 2139 08/22/24 0635 08/22/24 1118 08/22/24 1445  BP: (!) 132/44 (!) 132/44 (!) 138/40 (!) 142/45  Pulse: 74 74 66 69  Resp: 16 16 17 17   Temp: 98.3 F (36.8 C) 98.3 F (36.8 C) 98.4 F (36.9 C) 98.1 F (36.7 C)  TempSrc: Oral Oral Oral   SpO2: 92% 92% 96% 96%  Weight:      Height:        Intake/Output Summary (Last 24 hours) at 08/22/2024 1515 Last data filed at 08/22/2024 1353 Gross per 24 hour  Intake 520 ml  Output 1250 ml  Net -730 ml   Filed Weights   08/19/24 1206 08/19/24 1944  Weight: 65.8 kg 64.1 kg    Examination:  General exam: Patient is awake and alert.  Patient is  in painful distress.  Patient is pale.   Respiratory system: Clear to auscultation. Respiratory effort normal. Cardiovascular system: S1 & S2 heard Gastrointestinal system: Abdomen is soft and nontender.  Central nervous system: Awake and alert.     Data Reviewed: I have personally reviewed following labs and imaging studies  CBC: Recent Labs  Lab 08/19/24 1153 08/20/24 0542  WBC 9.7 9.7  HGB 10.6* 10.1*  HCT 32.8* 30.3*  MCV 102.2* 100.3*  PLT 210 198   Basic Metabolic Panel: Recent Labs  Lab 08/19/24 1153  08/20/24 0542  NA 135 135  K 4.3 4.3  CL 99 100  CO2 27 25  GLUCOSE 138* 71  BUN 25* 22  CREATININE 1.18* 1.14*  CALCIUM 9.8 9.2   GFR: Estimated Creatinine Clearance: 31.3 mL/min (A) (by C-G formula based on SCr of 1.14 mg/dL (H)). Liver Function Tests: Recent Labs  Lab 08/19/24 1153  AST 22  ALT <5  ALKPHOS 77  BILITOT 0.6  PROT 6.5  ALBUMIN 3.6   No results for input(s): LIPASE, AMYLASE in the last 168 hours. No results for input(s): AMMONIA in the last 168 hours. Coagulation Profile: No results for input(s): INR, PROTIME in the last 168 hours. Cardiac Enzymes: No results for input(s): CKTOTAL, CKMB, CKMBINDEX, TROPONINI in the last 168 hours. BNP (last 3 results) Recent Labs    12/03/23 1607  PROBNP 774.0*   HbA1C: No results for input(s): HGBA1C in the last 72 hours. CBG: Recent Labs  Lab 08/19/24 1146  GLUCAP 154*   Lipid Profile: No results for input(s): CHOL, HDL, LDLCALC, TRIG, CHOLHDL, LDLDIRECT in the last 72 hours. Thyroid  Function Tests: Recent Labs    08/20/24 0542  TSH 12.000*   Anemia Panel: Recent Labs    08/20/24 0542  VITAMINB12 811  FOLATE 17.4  FERRITIN 119  TIBC 272  IRON 45  RETICCTPCT 1.2   Urine analysis:    Component Value Date/Time   COLORURINE YELLOW 08/19/2024 1404   APPEARANCEUR HAZY (A) 08/19/2024 1404   APPEARANCEUR Clear 04/22/2018 1608   LABSPEC 1.009 08/19/2024 1404   PHURINE 6.0 08/19/2024 1404   GLUCOSEU NEGATIVE 08/19/2024 1404   HGBUR SMALL (A) 08/19/2024 1404   BILIRUBINUR NEGATIVE 08/19/2024 1404   BILIRUBINUR Negative 04/22/2018 1608   KETONESUR NEGATIVE 08/19/2024 1404   PROTEINUR NEGATIVE 08/19/2024 1404   UROBILINOGEN 0.2 07/30/2014 2003   NITRITE NEGATIVE 08/19/2024 1404   LEUKOCYTESUR LARGE (A) 08/19/2024 1404   Sepsis Labs: @LABRCNTIP (procalcitonin:4,lacticidven:4)  )No results found for this or any previous visit (from the past 240 hours).        Radiology Studies: No results found.       Scheduled Meds:  carbidopa -levodopa   1 tablet Oral TID   docusate sodium   100 mg Oral BID   enoxaparin  (LOVENOX ) injection  40 mg Subcutaneous Q24H   folic acid   1 mg Oral Daily   levothyroxine   75 mcg Oral Q0600   lidocaine   1 patch Transdermal Q24H   multivitamin with minerals  1 tablet Oral Daily   oxyCODONE   10 mg Oral Q12H   senna  1 tablet Oral BID   thiamine   100 mg Oral Daily   Continuous Infusions:     LOS: 2 days    Time spent: 35 minutes    Leatrice Chapel, MD  [facility] 7PM-7AM contact night coverage as above    "

## 2024-08-22 NOTE — Plan of Care (Signed)

## 2024-08-22 NOTE — Plan of Care (Signed)
   Problem: Coping: Goal: Level of anxiety will decrease Outcome: Progressing   Problem: Pain Managment: Goal: General experience of comfort will improve and/or be controlled Outcome: Progressing

## 2024-08-23 LAB — CBC WITH DIFFERENTIAL/PLATELET
Abs Immature Granulocytes: 0.05 K/uL (ref 0.00–0.07)
Basophils Absolute: 0 K/uL (ref 0.0–0.1)
Basophils Relative: 1 %
Eosinophils Absolute: 0.2 K/uL (ref 0.0–0.5)
Eosinophils Relative: 3 %
HCT: 31.3 % — ABNORMAL LOW (ref 36.0–46.0)
Hemoglobin: 10.5 g/dL — ABNORMAL LOW (ref 12.0–15.0)
Immature Granulocytes: 1 %
Lymphocytes Relative: 15 %
Lymphs Abs: 1.2 K/uL (ref 0.7–4.0)
MCH: 34.1 pg — ABNORMAL HIGH (ref 26.0–34.0)
MCHC: 33.5 g/dL (ref 30.0–36.0)
MCV: 101.6 fL — ABNORMAL HIGH (ref 80.0–100.0)
Monocytes Absolute: 0.8 K/uL (ref 0.1–1.0)
Monocytes Relative: 9 %
Neutro Abs: 5.8 K/uL (ref 1.7–7.7)
Neutrophils Relative %: 71 %
Platelets: 202 K/uL (ref 150–400)
RBC: 3.08 MIL/uL — ABNORMAL LOW (ref 3.87–5.11)
RDW: 12.1 % (ref 11.5–15.5)
WBC: 8.1 K/uL (ref 4.0–10.5)
nRBC: 0 % (ref 0.0–0.2)

## 2024-08-23 LAB — RENAL FUNCTION PANEL
Albumin: 3 g/dL — ABNORMAL LOW (ref 3.5–5.0)
Anion gap: 11 (ref 5–15)
BUN: 29 mg/dL — ABNORMAL HIGH (ref 8–23)
CO2: 24 mmol/L (ref 22–32)
Calcium: 9.8 mg/dL (ref 8.9–10.3)
Chloride: 98 mmol/L (ref 98–111)
Creatinine, Ser: 1.25 mg/dL — ABNORMAL HIGH (ref 0.44–1.00)
GFR, Estimated: 41 mL/min — ABNORMAL LOW
Glucose, Bld: 156 mg/dL — ABNORMAL HIGH (ref 70–99)
Phosphorus: 3.5 mg/dL (ref 2.5–4.6)
Potassium: 5 mmol/L (ref 3.5–5.1)
Sodium: 133 mmol/L — ABNORMAL LOW (ref 135–145)

## 2024-08-23 LAB — MAGNESIUM: Magnesium: 2.2 mg/dL (ref 1.7–2.4)

## 2024-08-23 MED ORDER — ENOXAPARIN SODIUM 30 MG/0.3ML IJ SOSY
30.0000 mg | PREFILLED_SYRINGE | INTRAMUSCULAR | Status: DC
  Administered 2024-08-23 – 2024-08-28 (×6): 30 mg via SUBCUTANEOUS
  Filled 2024-08-23 (×6): qty 0.3

## 2024-08-23 NOTE — Plan of Care (Signed)
   Problem: Activity: Goal: Risk for activity intolerance will decrease Outcome: Progressing   Problem: Coping: Goal: Level of anxiety will decrease Outcome: Progressing   Problem: Pain Managment: Goal: General experience of comfort will improve and/or be controlled Outcome: Progressing

## 2024-08-23 NOTE — TOC Initial Note (Signed)
 Transition of Care [facility]) - Initial/Assessment Note   Patient Details  Name: Regina [name] MRN: [id number] Date of Birth: 28-Dec-1932  Transition of Care [facility]) CM/SW Contact:    Duwaine GORMAN Aran, LCSW Phone Number: 08/23/2024, 12:14 PM  Clinical Narrative: PT/OT evaluations recommended SNF. Spouse agreeable to rehab and requested [facility] as first choice as it is close to where they live. FL2 done; PASRR received. Initial referral faxed out in hub. CSW reached out to [facility] in admissions at [facility] to have the referral reviewed. Care management awaiting offers.  Expected Discharge Plan: Skilled Nursing Facility Barriers to Discharge: Continued Medical Work up  Patient Goals and CMS Choice Patient states their goals for this hospitalization and ongoing recovery are:: Rehab CMS Medicare.gov Compare Post Acute Care list provided to:: Patient Represenative (must comment) Choice offered to / list presented to : Spouse  Expected Discharge Plan and Services In-house Referral: Clinical Social Work Post Acute Care Choice: Skilled Nursing Facility Living arrangements for the past 2 months: Single Family Home          DME Arranged: N/A DME Agency: NA  Prior Living Arrangements/Services Living arrangements for the past 2 months: Single Family Home Lives with:: Spouse Patient language and need for interpreter reviewed:: Yes Do you feel safe going back to the place where you live?: Yes      Need for Family Participation in Patient Care: Yes (Comment) (Patient is oriented x2.) Care giver support system in place?: Yes (comment) Criminal Activity/Legal Involvement Pertinent to Current Situation/Hospitalization: No - Comment as needed  Activities of Daily Living ADL Screening (condition at time of admission) Independently performs ADLs?: No Does the patient have a NEW difficulty with bathing/dressing/toileting/self-feeding that is expected to last >3 days?: No Does the patient have a NEW  difficulty with getting in/out of bed, walking, or climbing stairs that is expected to last >3 days?: No Does the patient have a NEW difficulty with communication that is expected to last >3 days?: No Is the patient deaf or have difficulty hearing?: Yes Does the patient have difficulty seeing, even when wearing glasses/contacts?: No Does the patient have difficulty concentrating, remembering, or making decisions?: No  Permission Sought/Granted Permission sought to share information with : Facility Industrial/product Designer granted to share information with : Yes, Verbal Permission Granted Permission granted to share info w AGENCY: SNFs  Emotional Assessment Orientation: : Oriented to Self, Oriented to Place Alcohol  / Substance Use: Not Applicable Psych Involvement: No (comment)  Admission diagnosis:  Chest wall pain [R07.89] Acute lower UTI [N39.0] Fall at home, initial encounter [W19.CHERENE, Y92.009] Ambulatory dysfunction [R26.2] Acute left-sided back pain, unspecified back location [M54.9] Patient Active Problem List   Diagnosis Date Noted   Ambulatory dysfunction 08/19/2024   Back pain 08/19/2024   Orthostatic hypotension 12/19/2023   Sinus bradycardia 12/19/2023   Dizziness 12/17/2023   Acute colitis 09/27/2022   Parkinsonism (HCC) 02/14/2022   UTI (urinary tract infection) 04/09/2021   Gait disorder 12/28/2020   Malnutrition of moderate degree 03/10/2018   Acute on chronic diastolic CHF (congestive heart failure) (HCC)    Hip pain    Acute cystitis without hematuria    Delirium    Memory loss    Ataxia 03/09/2018   Weakness generalized 03/09/2018   Acute renal failure superimposed on chronic kidney disease 03/09/2018   Generalized weakness 03/09/2018   Internal hemorrhoids with complication 08/03/2014   Ulcer of lower limb (HCC) 08/02/2014   Chronic diastolic heart failure (HCC)  08/01/2014   Right bundle branch block 08/01/2014   Essential hypertension  08/01/2014   Hypothyroidism 08/01/2014   Rectal bleeding 07/31/2014   Hypertension 07/31/2014   GERD (gastroesophageal reflux disease) 07/31/2014   Other specified anemias 07/31/2014   Lower GI bleeding    Faintness    Syncope 07/30/2014   Varicose veins of bilateral lower extremities with other complications 07/05/2013   PCP:  Valma Carwin, MD Pharmacy:   [facility] [id number]), [place] - [address] [place]) [place] [address] Phone: [phone] Fax: [phone]     Social Drivers of Health (SDOH) Social History: SDOH Screenings   Food Insecurity: No Food Insecurity (08/19/2024)  Housing: Low Risk (08/19/2024)  Transportation Needs: No Transportation Needs (08/19/2024)  Utilities: Not At Risk (08/19/2024)  Social Connections: Unknown (08/19/2024)  Tobacco Use: Low Risk (08/16/2024)   SDOH Interventions:    Readmission Risk Interventions     No data to display

## 2024-08-23 NOTE — NC FL2 (Signed)
 " [place]  MEDICAID FL2 LEVEL OF CARE FORM     IDENTIFICATION  Patient Name: Regina [name] Birthdate: 11/17/1932 Sex: female Admission Date (Current Location): 08/19/2024  [facility] and [place] Number:  Producer, Television/film/video and Address:  [facility],  [address] [place], [place] [address]      Provider Number: [id number]  Attending Physician Name and Address:  Rosario Leatrice FERNS, MD  Relative Name and Phone Number:  Rayne Cowdrey (spouse) Ph: [phone]    Current Level of Care: Hospital Recommended Level of Care: Skilled Nursing Facility Prior Approval Number:    Date Approved/Denied:   PASRR Number: [id number]  Discharge Plan: SNF    Current Diagnoses: Patient Active Problem List   Diagnosis Date Noted   Ambulatory dysfunction 08/19/2024   Back pain 08/19/2024   Orthostatic hypotension 12/19/2023   Sinus bradycardia 12/19/2023   Dizziness 12/17/2023   Acute colitis 09/27/2022   Parkinsonism (HCC) 02/14/2022   UTI (urinary tract infection) 04/09/2021   Gait disorder 12/28/2020   Malnutrition of moderate degree 03/10/2018   Acute on chronic diastolic CHF (congestive heart failure) (HCC)    Hip pain    Acute cystitis without hematuria    Delirium    Memory loss    Ataxia 03/09/2018   Weakness generalized 03/09/2018   Acute renal failure superimposed on chronic kidney disease 03/09/2018   Generalized weakness 03/09/2018   Internal hemorrhoids with complication 08/03/2014   Ulcer of lower limb (HCC) 08/02/2014   Chronic diastolic heart failure (HCC) 08/01/2014   Right bundle branch block 08/01/2014   Essential hypertension 08/01/2014   Hypothyroidism 08/01/2014   Rectal bleeding 07/31/2014   Hypertension 07/31/2014   GERD (gastroesophageal reflux disease) 07/31/2014   Other specified anemias 07/31/2014   Lower GI bleeding    Faintness    Syncope 07/30/2014   Varicose veins of bilateral lower extremities with other complications 07/05/2013     Orientation RESPIRATION BLADDER Height & Weight     Self, Time  Normal Incontinent Weight: 141 lb 5 oz (64.1 kg) Height:  5' 6 (167.6 cm)  BEHAVIORAL SYMPTOMS/MOOD NEUROLOGICAL BOWEL NUTRITION STATUS      Continent Diet (Regular diet)  AMBULATORY STATUS COMMUNICATION OF NEEDS Skin   Extensive Assist Verbally Other (Comment) (Erythema: buttocks, lower back; Ecchymosis: bilateral arms)                       Personal Care Assistance Level of Assistance  Bathing, Feeding, Dressing Bathing Assistance: Limited assistance Feeding assistance: Independent Dressing Assistance: Limited assistance     Functional Limitations Info  Sight, Hearing, Speech Sight Info: Adequate Hearing Info: Impaired Speech Info: Adequate    SPECIAL CARE FACTORS FREQUENCY  PT (By licensed PT), OT (By licensed OT)     PT Frequency: 5x's/week OT Frequency: 5x's/week            Contractures Contractures Info: Not present    Additional Factors Info  Code Status, Allergies Code Status Info: DNR Allergies Info: Tape, Penicillins, Latex           Current Medications (08/23/2024):  This is the current hospital active medication list Current Facility-Administered Medications  Medication Dose Route Frequency Provider Last Rate Last Admin   acetaminophen  (TYLENOL ) tablet 650 mg  650 mg Oral Q6H PRN Krishnan, Gokul, MD       Or   acetaminophen  (TYLENOL ) suppository 650 mg  650 mg Rectal Q6H PRN Krishnan, Gokul, MD  carbidopa -levodopa  (SINEMET  IR) 25-100 MG per tablet immediate release 1 tablet  1 tablet Oral TID Krishnan, Gokul, MD   1 tablet at 08/23/24 9076   docusate sodium  (COLACE) capsule 100 mg  100 mg Oral BID Krishnan, Gokul, MD   100 mg at 08/23/24 0923   enoxaparin  (LOVENOX ) injection 40 mg  40 mg Subcutaneous Q24H Krishnan, Gokul, MD   40 mg at 08/22/24 2127   folic acid  (FOLVITE ) tablet 1 mg  1 mg Oral Daily Krishnan, Gokul, MD   1 mg at 08/23/24 9076   hydrALAZINE  (APRESOLINE )  injection 5 mg  5 mg Intravenous Q6H PRN Krishnan, Gokul, MD       levothyroxine  (SYNTHROID ) tablet 75 mcg  75 mcg Oral Q0600 Krishnan, Gokul, MD   75 mcg at 08/23/24 0549   lidocaine  (LIDODERM ) 5 % 1 patch  1 patch Transdermal Q24H Krishnan, Gokul, MD   1 patch at 08/22/24 2128   morphine  (PF) 2 MG/ML injection 1 mg  1 mg Intravenous Q4H PRN Krishnan, Gokul, MD   1 mg at 08/21/24 1540   multivitamin with minerals tablet 1 tablet  1 tablet Oral Daily Krishnan, Gokul, MD   1 tablet at 08/23/24 9076   ondansetron  (ZOFRAN ) tablet 4 mg  4 mg Oral Q6H PRN Krishnan, Gokul, MD       Or   ondansetron  (ZOFRAN ) injection 4 mg  4 mg Intravenous Q6H PRN Krishnan, Gokul, MD       oxyCODONE  (Oxy IR/ROXICODONE ) immediate release tablet 5 mg  5 mg Oral Q4H PRN Krishnan, Gokul, MD   5 mg at 08/21/24 1742   oxyCODONE  (OXYCONTIN ) 12 hr tablet 10 mg  10 mg Oral Q12H Ogbata, Sylvester I, MD   10 mg at 08/23/24 9076   senna (SENOKOT) tablet 8.6 mg  1 tablet Oral BID Krishnan, Gokul, MD   8.6 mg at 08/23/24 9076   thiamine  (VITAMIN B1) tablet 100 mg  100 mg Oral Daily Krishnan, Gokul, MD   100 mg at 08/23/24 9076     Discharge Medications: Please see discharge summary for a list of discharge medications.  Relevant Imaging Results:  Relevant Lab Results:   Additional Information SSN: [id number]  Duwaine GORMAN Aran, LCSW     "

## 2024-08-23 NOTE — Plan of Care (Signed)
  Problem: Pain Managment: Goal: General experience of comfort will improve and/or be controlled Outcome: Progressing

## 2024-08-23 NOTE — Progress Notes (Signed)
 Physical Therapy Treatment Patient Details Name: Regina [name] MRN: [id number] DOB: 09-21-1932 Today's Date: 08/23/2024   History of Present Illness Regina [name] is a 89 y.o. female who fell about 2 days prior to admission on 08/19/24.  She was apparently walking with her walker when she got tangled up and fell to the ground.  Complained of pain to the left side of her chest from the impact, blood work was unremarkable but UA was noted to be abnormal.  CT scan of her chest abdomen pelvis and CT head as well as CT cervical spine did not show any acute findings.  Patient was unable to get out of bed even with help.CT of the lumbar spine showed very subtle depression of the L3 superior endplate compared to the prior exam. This finding is likely not acute, although is indeterminate.   PMH: parkinsonism, hypothyroidism, essential hypertension    PT Comments  Pt continues to be limited by pain and some mild confusion.  She needs increased time to respond, difficulty with sequencing, distracted by pain, but is also lethargic and closing eyes when not stimulated.  Required max x 2 to get to EOB and mod A x 2 to take some steps to the chair.  Well below baseline.  Cont POC with Patient will benefit from continued inpatient follow up therapy, <3 hours/day at d/c.    If plan is discharge home, recommend the following: A lot of help with walking and/or transfers;A lot of help with bathing/dressing/bathroom;Assistance with cooking/housework;Assist for transportation;Help with stairs or ramp for entrance;Supervision due to cognitive status   Can travel by private vehicle     No  Equipment Recommendations  None recommended by PT    Recommendations for Other Services       Precautions / Restrictions Precautions Precautions: Fall     Mobility  Bed Mobility Overal bed mobility: Needs Assistance Bed Mobility: Supine to Sit     Supine to sit: +2 for physical assistance, Max assist     General bed mobility  comments: Increased cues - pt assisting some to move legs but overall max x 2    Transfers Overall transfer level: Needs assistance Equipment used: Rolling walker (2 wheels) Transfers: Sit to/from Stand Sit to Stand: Mod assist, +2 safety/equipment, From elevated surface   Step pivot transfers: Mod assist, +2 safety/equipment       General transfer comment: STS x 2 from elevated bed with cues for hand placement.  Posterior bias with increased time to rise.    Ambulation/Gait Ambulation/Gait assistance: Mod assist, +2 safety/equipment Gait Distance (Feet): 3 Feet Assistive device: Rolling walker (2 wheels) Gait Pattern/deviations: Step-to pattern, Decreased stride length, Shuffle Gait velocity: decreased     General Gait Details: Pt only able to take a few steps with mod A for balance. REquired max verbal cues and assist to weight shift with tactile cues to move legs.  Fatigued easily   Comptroller Bed    Modified Rankin (Stroke Patients Only)       Balance Overall balance assessment: Needs assistance Sitting-balance support: Feet supported, Bilateral upper extremity supported Sitting balance-Leahy Scale: Poor Sitting balance - Comments: Min-mod A at EOB Postural control: Posterior lean Standing balance support: During functional activity, Reliant on assistive device for balance, Bilateral upper extremity supported Standing balance-Leahy Scale: Poor Standing balance comment: Mod A  Communication Communication Factors Affecting Communication: Hearing impaired  Cognition Arousal: Lethargic Behavior During Therapy: Anxious   PT - Cognitive impairments: Problem solving, Awareness, Initiation                       PT - Cognition Comments: LImited by pain; reports I can't, I hurt all over.  Daughter present and encouraging pt Following commands: Impaired Following commands  impaired: Follows one step commands with increased time    Cueing Cueing Techniques: Verbal cues, Gestural cues, Visual cues, Tactile cues  Exercises General Exercises - Lower Extremity Ankle Circles/Pumps: AROM, Both, 5 reps, Supine Quad Sets: AROM, Both, 5 reps, Supine Long Arc Quad: AROM, Both, 10 reps, Seated Heel Slides: AAROM, Both, 5 reps, Supine    General Comments General comments (skin integrity, edema, etc.): VSS      Pertinent Vitals/Pain Pain Assessment Pain Assessment: Faces Faces Pain Scale: Hurts whole lot Pain Location: L sidebody and generalized with movement Pain Descriptors / Indicators: Discomfort, Grimacing, Guarding Pain Intervention(s): Limited activity within patient's tolerance, Monitored during session, Premedicated before session, Repositioned, Other (comment) (has lidocaine  patch)    Home Living                          Prior Function            PT Goals (current goals can now be found in the care plan section) Progress towards PT goals: Progressing toward goals    Frequency    Min 2X/week      PT Plan      Co-evaluation              AM-PAC PT 6 Clicks Mobility   Outcome Measure  Help needed turning from your back to your side while in a flat bed without using bedrails?: Total Help needed moving from lying on your back to sitting on the side of a flat bed without using bedrails?: Total Help needed moving to and from a bed to a chair (including a wheelchair)?: A Lot Help needed standing up from a chair using your arms (e.g., wheelchair or bedside chair)?: A Lot Help needed to walk in hospital room?: Total Help needed climbing 3-5 steps with a railing? : Total 6 Click Score: 8    End of Session Equipment Utilized During Treatment: Gait belt Activity Tolerance: Patient limited by pain Patient left: in chair;with call bell/phone within reach;with chair alarm set;with family/visitor present Nurse Communication:  Mobility status;Need for lift equipment (+2 pivot, STEDY would be beneficial) PT Visit Diagnosis: Unsteadiness on feet (R26.81);Other abnormalities of gait and mobility (R26.89);Muscle weakness (generalized) (M62.81);Pain Pain - Right/Left: Left     Time: 8679-8653 PT Time Calculation (min) (ACUTE ONLY): 26 min  Charges:    $Therapeutic Exercise: 8-22 mins $Therapeutic Activity: 8-22 mins PT General Charges $$ ACUTE PT VISIT: 1 Visit                     Benjiman, PT Acute Rehab [facility] [phone]    Benjiman VEAR [place] 08/23/2024, 2:42 PM

## 2024-08-23 NOTE — Progress Notes (Signed)
 " PROGRESS NOTE    Regina [name]  FMW:[id number] DOB: July 16, 1933 DOA: 08/19/2024 PCP: Valma Carwin, MD  Outpatient Specialists:     Brief Narrative:  Patient is an 89 year old female with past medical history significant for parkinsonism, hypertension and hypothyroidism.  Patient was admitted with back pain, inability to ambulate following recent fall.  On presentation to the emergency room, blood work was unremarkable but UA was noted to be abnormal. CT scan of her chest abdomen pelvis and CT head as well as CT cervical spine did not show any acute findings. Patient was unable to get out of bed even with help.  Husband is unable to take care of patient.  Pain remains uncontrolled.  08/20/2024: Patient seen alongside patient's husband.  Pain remains well-controlled.  Specifically, patient reports pain around the mid, lateral rib cage area.  Will apply lidocaine  patch.  Continue oxycodone  5 mg every 4 hours as needed.  Will start patient on OxyContin  10 mg p.o. twice daily.  08/23/2024: Patient seen alongside patient's daughter, Regina [name].  Pain is significantly controlled.  Pursue disposition.  SNF is recommended.   Assessment & Plan:   Principal Problem:   Ambulatory dysfunction Active Problems:   Chronic diastolic heart failure (HCC)   Essential hypertension   Parkinsonism (HCC)   Back pain   Musculoskeletal pain secondary to fall -Patient reports pain in the left chest area as well as her lower back.  - No significant findings on imaging studies.  CT of the lumbar spine showed very subtle depression of the L3 superior endplate compared to the prior exam. This finding is likely not acute, although is indeterminate. -Optimize pain control. - PT/OT input. 08/23/2024: Pursue disposition.   Possible urinary tract infection: -No urine culture visualized. - Patient has completed 3-day course of Rocephin .       Parkinsonism -Followed by Dr. Buck with neurology.   -Continue  Sinemet . -Parkinsonism may be contributing to patient's falls.   Macrocytic anemia -Vitamin B12 level of 811. - Folate level of 17.4. - Iron of 45, ferritin of 119, TIBC of 272 and saturation ratio of 16.  - Depending on goals of care, may consider referral to hematology team on discharge to rule out myelodysplastic syndrome.   Chronic kidney disease stage IIIa/b -Currently at baseline. - Serum creatinine of 1.25 today, with eGFR of 41 mL/min per 1.73 m.     DVT prophylaxis: Subcutaneous Lovenox  Code Status: DO NOT RESUSCITATE Family Communication: Husband by bedside Disposition Plan: Admit to inpatient   Consultants:  None.  Procedures:  None.  Antimicrobials:  Patient has completed 3 course of IV Rocephin  for possible UTI.   Subjective: No new complaints.   Objective: Vitals:   08/22/24 2123 08/23/24 0542 08/23/24 0555 08/23/24 1424  BP: (!) 168/53 (!) 134/39 (!) 141/43 (!) 103/39  Pulse: 75 65 66 70  Resp: 15 15  14   Temp: 98.9 F (37.2 C) 98.1 F (36.7 C)  98 F (36.7 C)  TempSrc: Oral   Oral  SpO2: 96% 97%  99%  Weight:      Height:        Intake/Output Summary (Last 24 hours) at 08/23/2024 1821 Last data filed at 08/23/2024 1400 Gross per 24 hour  Intake 600 ml  Output 1400 ml  Net -800 ml   Filed Weights   08/19/24 1206 08/19/24 1944  Weight: 65.8 kg 64.1 kg    Examination:  General exam: Patient is awake and alert.  Patient is not in  painful distress.  Patient is pale.   Respiratory system: Clear to auscultation. Respiratory effort normal. Cardiovascular system: S1 & S2 heard Gastrointestinal system: Abdomen is soft and nontender.  Central nervous system: Awake and alert.     Data Reviewed: I have personally reviewed following labs and imaging studies  CBC: Recent Labs  Lab 08/19/24 1153 08/20/24 0542 08/23/24 1156  WBC 9.7 9.7 8.1  NEUTROABS  --   --  5.8  HGB 10.6* 10.1* 10.5*  HCT 32.8* 30.3* 31.3*  MCV 102.2* 100.3* 101.6*   PLT 210 198 202   Basic Metabolic Panel: Recent Labs  Lab 08/19/24 1153 08/20/24 0542 08/23/24 1156  NA 135 135 133*  K 4.3 4.3 5.0  CL 99 100 98  CO2 27 25 24   GLUCOSE 138* 71 156*  BUN 25* 22 29*  CREATININE 1.18* 1.14* 1.25*  CALCIUM 9.8 9.2 9.8  MG  --   --  2.2  PHOS  --   --  3.5   GFR: Estimated Creatinine Clearance: 28.6 mL/min (A) (by C-G formula based on SCr of 1.25 mg/dL (H)). Liver Function Tests: Recent Labs  Lab 08/19/24 1153 08/23/24 1156  AST 22  --   ALT <5  --   ALKPHOS 77  --   BILITOT 0.6  --   PROT 6.5  --   ALBUMIN 3.6 3.0*   No results for input(s): LIPASE, AMYLASE in the last 168 hours. No results for input(s): AMMONIA in the last 168 hours. Coagulation Profile: No results for input(s): INR, PROTIME in the last 168 hours. Cardiac Enzymes: No results for input(s): CKTOTAL, CKMB, CKMBINDEX, TROPONINI in the last 168 hours. BNP (last 3 results) Recent Labs    12/03/23 1607  PROBNP 774.0*   HbA1C: No results for input(s): HGBA1C in the last 72 hours. CBG: Recent Labs  Lab 08/19/24 1146  GLUCAP 154*   Lipid Profile: No results for input(s): CHOL, HDL, LDLCALC, TRIG, CHOLHDL, LDLDIRECT in the last 72 hours. Thyroid  Function Tests: No results for input(s): TSH, T4TOTAL, FREET4, T3FREE, THYROIDAB in the last 72 hours.  Anemia Panel: No results for input(s): VITAMINB12, FOLATE, FERRITIN, TIBC, IRON, RETICCTPCT in the last 72 hours.  Urine analysis:    Component Value Date/Time   COLORURINE YELLOW 08/19/2024 1404   APPEARANCEUR HAZY (A) 08/19/2024 1404   APPEARANCEUR Clear 04/22/2018 1608   LABSPEC 1.009 08/19/2024 1404   PHURINE 6.0 08/19/2024 1404   GLUCOSEU NEGATIVE 08/19/2024 1404   HGBUR SMALL (A) 08/19/2024 1404   BILIRUBINUR NEGATIVE 08/19/2024 1404   BILIRUBINUR Negative 04/22/2018 1608   KETONESUR NEGATIVE 08/19/2024 1404   PROTEINUR NEGATIVE 08/19/2024 1404    UROBILINOGEN 0.2 07/30/2014 2003   NITRITE NEGATIVE 08/19/2024 1404   LEUKOCYTESUR LARGE (A) 08/19/2024 1404   Sepsis Labs: @LABRCNTIP (procalcitonin:4,lacticidven:4)  )No results found for this or any previous visit (from the past 240 hours).       Radiology Studies: No results found.       Scheduled Meds:  carbidopa -levodopa   1 tablet Oral TID   docusate sodium   100 mg Oral BID   enoxaparin  (LOVENOX ) injection  30 mg Subcutaneous Q24H   folic acid   1 mg Oral Daily   levothyroxine   75 mcg Oral Q0600   lidocaine   1 patch Transdermal Q24H   multivitamin with minerals  1 tablet Oral Daily   oxyCODONE   10 mg Oral Q12H   senna  1 tablet Oral BID   thiamine   100 mg Oral Daily  Continuous Infusions:     LOS: 3 days    Time spent: 35 minutes    Leatrice Chapel, MD  [facility] 7PM-7AM contact night coverage as above    "

## 2024-08-24 MED ORDER — LACTULOSE 10 GM/15ML PO SOLN
20.0000 g | Freq: Two times a day (BID) | ORAL | Status: DC | PRN
  Administered 2024-08-24 – 2024-08-25 (×2): 20 g via ORAL
  Filled 2024-08-24 (×2): qty 30

## 2024-08-24 NOTE — Hospital Course (Signed)
 Patient is an 89 year old female with past medical history significant for parkinsonism, hypertension and hypothyroidism.  Patient was admitted with back pain, inability to ambulate following recent fall.  On presentation to the emergency room, blood work was unremarkable but UA was noted to be abnormal. CT scan of her chest abdomen pelvis and CT head as well as CT cervical spine did not show any acute findings. Patient was unable to get out of bed even with help.  Husband is unable to take care of patient.  Pain remains uncontrolled.   08/20/2024: Patient seen alongside patient's husband.  Pain remains well-controlled.  Specifically, patient reports pain around the mid, lateral rib cage area.  Will apply lidocaine  patch.  Continue oxycodone  5 mg every 4 hours as needed.  Will start patient on OxyContin  10 mg p.o. twice daily.   08/23/2024: Patient seen alongside patient's daughter, Dorthea.  Pain is significantly controlled.  Pursue disposition.  SNF is recommended.  08/24/2024: Patient was slightly sleepy and irritated.  Could be discharged in next 24 hours.

## 2024-08-24 NOTE — Plan of Care (Signed)
   Problem: Activity: Goal: Risk for activity intolerance will decrease Outcome: Progressing   Problem: Coping: Goal: Level of anxiety will decrease Outcome: Progressing   Problem: Pain Managment: Goal: General experience of comfort will improve and/or be controlled Outcome: Progressing

## 2024-08-24 NOTE — Progress Notes (Addendum)
 Mobility Specialist Progress Note:   08/24/24 1114  Mobility  Activity  (Bed Exercises)  Range of Motion/Exercises Active Assistive  Activity Response Tolerated poorly  Mobility Referral Yes  Mobility visit 1 Mobility  Mobility Specialist Start Time (ACUTE ONLY) 0935  Mobility Specialist Stop Time (ACUTE ONLY) 0945  Mobility Specialist Time Calculation (min) (ACUTE ONLY) 10 min   Pt was received in bed and agreed to mobility after much encouragement. Opted for bed exercises: Seated BLE Exercises:  1) Toe Raise/ Heel Raise: 1 x 10  2) Ankle Pumps: 1 x 5 (active assistive)  Pt c/o soreness in legs, deferred further ambulation at this time.  Left in room with family, call bell in reach.  [employer] - Mobility Specialist

## 2024-08-24 NOTE — Progress Notes (Signed)
 " Progress Note   Patient: Regina [name] FMW:[id number] DOB: 1932-09-27 DOA: 08/19/2024     4 DOS: the patient was seen and examined on 08/24/2024   Brief hospital course: Patient is an 89 year old female with past medical history significant for parkinsonism, hypertension and hypothyroidism.  Patient was admitted with back pain, inability to ambulate following recent fall.  On presentation to the emergency room, blood work was unremarkable but UA was noted to be abnormal. CT scan of her chest abdomen pelvis and CT head as well as CT cervical spine did not show any acute findings. Patient was unable to get out of bed even with help.  Husband is unable to take care of patient.  Pain remains uncontrolled.   08/20/2024: Patient seen alongside patient's husband.  Pain remains well-controlled.  Specifically, patient reports pain around the mid, lateral rib cage area.  Will apply lidocaine  patch.  Continue oxycodone  5 mg every 4 hours as needed.  Will start patient on OxyContin  10 mg p.o. twice daily.   08/23/2024: Patient seen alongside patient's daughter, Dorthea.  Pain is significantly controlled.  Pursue disposition.  SNF is recommended.  08/24/2024: Patient was slightly sleepy and irritated.  Could be discharged in next 24 hours.  Assessment and Plan:  Musculoskeletal pain secondary to fall -Patient reports pain in the left chest area as well as her lower back.  - No significant findings on imaging studies.  CT of the lumbar spine showed very subtle depression of the L3 superior endplate compared to the prior exam. This finding is likely not acute, although is indeterminate. -Optimize pain control. - PT/OT input, possible skilled nursing facility placement   Possible urinary tract infection: -No urine culture visualized. - S/p placement of 3-day course of Rocephin .       Parkinsonism -Followed by Dr. Buck with neurology.   -Continue Sinemet . -Parkinsonism may be contributing to patient's falls.    Macrocytic anemia -Vitamin B12 level of 811. - Folate level of 17.4. - Iron of 45, ferritin of 119, TIBC of 272 and saturation ratio of 16.  - Depending on goals of care, may consider referral to hematology team on discharge to rule out myelodysplastic syndrome.   Chronic kidney disease stage IIIa/b -Currently at baseline. - Serum creatinine of 1.25 today, with eGFR of 41 mL/min per 1.73 m.       Subjective: Patient was sleepy  Physical Exam: Vitals:   08/23/24 1424 08/23/24 2114 08/24/24 0559 08/24/24 0626  BP: (!) 103/39 (!) 147/49 (!) 177/47 (!) 154/56  Pulse: 70 78 72 70  Resp: 14 16 16    Temp: 98 F (36.7 C) 98.7 F (37.1 C) 98.3 F (36.8 C)   TempSrc: Oral     SpO2: 99% 97% 97%   Weight:      Height:       Constitutional: Alert, sleepy HEENT: Neck supple Respiratory: Clear to auscultation B/L, no wheezing, no rales.  Cardiovascular: Regular rate and rhythm, no murmurs / rubs / gallops. No extremity edema. 2+ pedal pulses. No carotid bruits.  Abdomen: Soft, no tenderness, Bowel sounds positive.  Musculoskeletal: no clubbing / cyanosis. Good ROM, no contractures. Normal muscle tone.  Skin: no rashes, lesions, ulcers. Neurologic: Female the extremities equally Psychiatric: Sleepy  Data Reviewed:  There are no new results to review at this time.  Family Communication: None  Disposition: Status is: Inpatient Remains inpatient appropriate because: Ongoing recovery from fall and UTI requiring placement  Planned Discharge Destination: Skilled nursing facility  Time spent: 25 minutes  Author: Nena Rebel, MD 08/24/2024 4:07 PM  For on call review [email or web address].  "

## 2024-08-24 NOTE — Plan of Care (Signed)
   Problem: Education: Goal: Knowledge of General Education information will improve Description Including pain rating scale, medication(s)/side effects and non-pharmacologic comfort measures Outcome: Progressing   Problem: Health Behavior/Discharge Planning: Goal: Ability to manage health-related needs will improve Outcome: Progressing

## 2024-08-25 ENCOUNTER — Encounter (HOSPITAL_COMMUNITY): Payer: Self-pay | Admitting: Internal Medicine

## 2024-08-25 DIAGNOSIS — R262 Difficulty in walking, not elsewhere classified: Secondary | ICD-10-CM | POA: Diagnosis not present

## 2024-08-25 LAB — T4, FREE: Free T4: 1.24 ng/dL (ref 0.80–2.00)

## 2024-08-25 MED ORDER — FLEET ENEMA RE ENEM
1.0000 | ENEMA | Freq: Once | RECTAL | Status: AC
  Administered 2024-08-25: 1 via RECTAL
  Filled 2024-08-25: qty 1

## 2024-08-25 NOTE — Progress Notes (Signed)
 OT Cancellation Note  Patient Details Name: Regina [name] MRN: [id number] DOB: 1933/02/13   Cancelled Treatment:    Reason Eval/Treat Not Completed: Medical issues which prohibited therapy. RN requesting to hold at this time, pt with temp of 97.3 and lethargic. OT will hold and check back as able / appropriate.   Barbara Ahart L. Infiniti Hoefling, OTR/L  08/25/2024, 2:57 PM

## 2024-08-25 NOTE — Progress Notes (Signed)
 " [facility] Progress Note  Patient: Regina [name]     FMW:[id number]  DOA: 08/19/2024   PCP: Valma Carwin, MD       Brief hospital course: 89 year old female with Parkinson's disease, hypertension, and hypothyroidism who presented to the hospital after a fall.  She had extensive CT imaging and no acute issues were noted.  The patient however was in severe pain and was quite weak.  She complained of pain in her left hip and left lateral rib cage.  She was started on pain medication including OxyContin  10 mg p.o. twice a day and oxycodone  5 mg p.o. every 4 as needed.  A lidocaine  patch was also placed. The pain was controlled however, the patient was noted to be sleepy. Today her husband states that he would like to cut back on her pain medication so she can be more alert.  Her husband also states that she has not had a bowel movement in 1 week.  Subjective:  Patient initially was moaning and then fell asleep.  She was able to awaken after a few minutes on her own but did not answer questions appropriately.  Assessment and Plan: Principal Problem:   Ambulatory dysfunction-Parkinson's disease - In the setting of Parkinson's and an acute fall - She recently saw her primary neurologist who decided that it was best not to increase her Sinemet  - At this point we are searching for skilled nursing facility for her.  Active Problems: Acute left hip and left lateral chest wall pain - Secondary to fall - According to her husband, she is more lethargic than usual-on exam she only briefly opens her eyes-in fact he was feeding her and she was eating with her eyes closed - Will hold OxyContin  and managed with as needed oxycodone -discussed with RN  Constipation -1 week without bowel movements-per husband the patient had overall been eating well - Has not responded to oral laxatives - Have ordered Fleet enema  Possible UTI - UA from 1/9 revealed: Few bacteria, greater than 50 WBCs, negative  nitrites - No urine culture done - Treated with ceftriaxone  x 3 days  Macrocytic anemia - Vitamin B12 normal at 811 and folate level normal at 17.4 - No iron deficiency noted on lab work  CKD stage IIIa  Elevated TSH - TSH is 12 - Follow-up free T4    Code Status: Limited: Do not attempt resuscitation (DNR) -DNR-LIMITED -Do Not Intubate/DNI  Total time on patient care: 40 minutes DVT prophylaxis:  enoxaparin  (LOVENOX ) injection 30 mg Start: 08/23/24 2200     Objective:   Vitals:   08/24/24 1941 08/25/24 0530 08/25/24 0856 08/25/24 1317  BP: (!) 160/55 (!) 155/67 (!) 149/42 (!) 126/45  Pulse: 82 99 76 82  Resp: 19 16 16 16   Temp: 99.6 F (37.6 C) 98.7 F (37.1 C) 97.9 F (36.6 C) (!) 97.3 F (36.3 C)  TempSrc: Oral Oral Oral Oral  SpO2: 98% 99% 100% 98%  Weight:      Height:       Filed Weights   08/19/24 1206 08/19/24 1944  Weight: 65.8 kg 64.1 kg   Exam: General exam: Appears comfortable-mostly keeps her eyes closed-not communicating with me and not following commands HEENT: oral mucosa moist Respiratory system: Clear to auscultation.  Cardiovascular system: S1 & S2 heard  Gastrointestinal system: Abdomen soft, non-tender, nondistended. Normal bowel sounds   Extremities: No cyanosis, clubbing or edema Psychiatry:  Mood & affect appropriate.      CBC: Recent  Labs  Lab 08/19/24 1153 08/20/24 0542 08/23/24 1156  WBC 9.7 9.7 8.1  NEUTROABS  --   --  5.8  HGB 10.6* 10.1* 10.5*  HCT 32.8* 30.3* 31.3*  MCV 102.2* 100.3* 101.6*  PLT 210 198 202   Basic Metabolic Panel: Recent Labs  Lab 08/19/24 1153 08/20/24 0542 08/23/24 1156  NA 135 135 133*  K 4.3 4.3 5.0  CL 99 100 98  CO2 27 25 24   GLUCOSE 138* 71 156*  BUN 25* 22 29*  CREATININE 1.18* 1.14* 1.25*  CALCIUM 9.8 9.2 9.8  MG  --   --  2.2  PHOS  --   --  3.5     Scheduled Meds:  carbidopa -levodopa   1 tablet Oral TID   docusate sodium   100 mg Oral BID   enoxaparin  (LOVENOX ) injection   30 mg Subcutaneous Q24H   folic acid   1 mg Oral Daily   levothyroxine   75 mcg Oral Q0600   lidocaine   1 patch Transdermal Q24H   multivitamin with minerals  1 tablet Oral Daily   senna  1 tablet Oral BID   thiamine   100 mg Oral Daily    Imaging and lab data personally reviewed   Author: Keiran Sias  08/25/2024 3:29 PM  To contact [facility]>   Check the care team in [facility] and look for the attending/consulting [facility] provider listed  Log into [email or web address] and use [place]'s universal password   Go to> [facility]  and find provider  If you still have difficulty reaching the provider, please page the [facility] (Director on Call) for the Hospitalists listed on amion     "

## 2024-08-25 NOTE — Plan of Care (Signed)
 ?  Problem: Coping: ?Goal: Level of anxiety will decrease ?Outcome: Progressing ?  ?Problem: Safety: ?Goal: Ability to remain free from injury will improve ?Outcome: Progressing ?  ?

## 2024-08-26 ENCOUNTER — Inpatient Hospital Stay (HOSPITAL_COMMUNITY)

## 2024-08-26 DIAGNOSIS — R262 Difficulty in walking, not elsewhere classified: Secondary | ICD-10-CM | POA: Diagnosis not present

## 2024-08-26 LAB — RESPIRATORY PANEL BY PCR

## 2024-08-26 LAB — RESP PANEL BY RT-PCR (RSV, FLU A&B, COVID)  RVPGX2
Influenza A by PCR: NEGATIVE
Influenza B by PCR: NEGATIVE
Resp Syncytial Virus by PCR: NEGATIVE
SARS Coronavirus 2 by RT PCR: NEGATIVE

## 2024-08-26 LAB — URINALYSIS, W/ REFLEX TO CULTURE (INFECTION SUSPECTED)
Bilirubin Urine: NEGATIVE
Glucose, UA: NEGATIVE mg/dL
Ketones, ur: NEGATIVE mg/dL
Nitrite: NEGATIVE
Protein, ur: NEGATIVE mg/dL
Specific Gravity, Urine: 1.012 (ref 1.005–1.030)
pH: 5 (ref 5.0–8.0)

## 2024-08-26 LAB — CBC
HCT: 30.8 % — ABNORMAL LOW (ref 36.0–46.0)
Hemoglobin: 10.4 g/dL — ABNORMAL LOW (ref 12.0–15.0)
MCH: 33.5 pg (ref 26.0–34.0)
MCHC: 33.8 g/dL (ref 30.0–36.0)
MCV: 99.4 fL (ref 80.0–100.0)
Platelets: 188 K/uL (ref 150–400)
RBC: 3.1 MIL/uL — ABNORMAL LOW (ref 3.87–5.11)
RDW: 12 % (ref 11.5–15.5)
WBC: 21.7 K/uL — ABNORMAL HIGH (ref 4.0–10.5)
nRBC: 0 % (ref 0.0–0.2)

## 2024-08-26 NOTE — Progress Notes (Signed)
 Physical Therapy Treatment Patient Details Name: Regina [name] MRN: [id number] DOB: [birth year] Today's Date: 08/26/2024   History of Present Illness Regina [name] is a 89 y.o. female who fell about 2 days prior to admission on 08/19/24.  She was apparently walking with her walker when she got tangled up and fell to the ground.  Complained of pain to the left side of her chest from the impact, blood work was unremarkable but UA was noted to be abnormal.  CT scan of her chest abdomen pelvis and CT head as well as CT cervical spine did not show any acute findings.  Patient was unable to get out of bed even with help.CT of the lumbar spine showed very subtle depression of the L3 superior endplate compared to the prior exam. This finding is likely not acute, although is indeterminate.   PMH: parkinsonism, hypothyroidism, essential hypertension    PT Comments   Pt making limited progress with PT.  Pt requiring incr assist overall today compare to last PT session. PT +2 max-total assist for bed mobility, STS transfer with stedy utilized for pivot to chair. Pt is limited by global weakness in addition to complaints of diffuse pain with any movement. Pt dtr and spouse present and encouraging pt to mobilize. D/c plan remains appropriate at this time. Patient will benefit from continued inpatient follow up therapy, <3 hours/day   If plan is discharge home, recommend the following: Two people to help with walking and/or transfers;Two people to help with bathing/dressing/bathroom;Assistance with feeding;Assist for transportation;Help with stairs or ramp for entrance;Supervision due to cognitive status   Can travel by private vehicle     No  Equipment Recommendations  None recommended by PT    Recommendations for Other Services       Precautions / Restrictions Precautions Precautions: Fall Recall of Precautions/Restrictions: Impaired Restrictions Weight Bearing Restrictions Per Provider Order: No      Mobility  Bed Mobility Overal bed mobility: Needs Assistance Bed Mobility: Supine to Sit     Supine to sit: +2 for safety/equipment, +2 for physical assistance, Total assist     General bed mobility comments: Increased cues - pt unable to self assist even with incr time. assist with trunk, LEs, bed pad used to pivot hips to EOB    Transfers Overall transfer level: Needs assistance   Transfers: Sit to/from Stand, Bed to chair/wheelchair/BSC Sit to Stand: Total assist, Max assist, +2 physical assistance, +2 safety/equipment, From elevated surface           General transfer comment: pt needing +2 max/total assist  to come to stand. bed pad used to elevate and extend hips; hand over hand to place UEs on front stedy bar to facilitate anterior wt shift  prior to stand Transfer via Lift Equipment: Stedy  Ambulation/Gait                   Stairs             Wheelchair Mobility     Tilt Bed    Modified Rankin (Stroke Patients Only)       Balance Overall balance assessment: Needs assistance Sitting-balance support: Feet supported, Bilateral upper extremity supported Sitting balance-Leahy Scale: Poor Sitting balance - Comments: mod A to max A  at EOB Postural control: Posterior lean Standing balance support: During functional activity Standing balance-Leahy Scale: Zero Standing balance comment: +2 max assist to maintain brief standing  Communication Communication Communication: Impaired Factors Affecting Communication: Hearing impaired;Difficulty expressing self  Cognition Arousal: Alert Behavior During Therapy: Flat affect   PT - Cognitive impairments: Problem solving, Awareness, Initiation                       PT - Cognition Comments: LImited by pain; Daughter present and encouraging pt Following commands: Impaired Following commands impaired: Follows one step commands inconsistently    Cueing  Cueing Techniques: Verbal cues, Gestural cues, Visual cues, Tactile cues  Exercises General Exercises - Lower Extremity Ankle Circles/Pumps: PROM, Both, 5 reps Heel Slides: Both, 5 reps, Supine, Limitations Heel Slides Limitations: pt resistant to movement d/t pain    General Comments General comments (skin integrity, edema, etc.): L UE edematous - dtr reports appears more swollen today than yesterday. elevated bil UEs on pillows after transfer to chair      Pertinent Vitals/Pain Pain Assessment Pain Assessment: Faces Faces Pain Scale: Hurts whole lot Pain Location: diffuse with movement. pt unable to specify Pain Descriptors / Indicators: Discomfort, Grimacing, Guarding, Moaning Pain Intervention(s): Limited activity within patient's tolerance, Monitored during session, Repositioned    Home Living                          Prior Function            PT Goals (current goals can now be found in the care plan section) Acute Rehab PT Goals Patient Stated Goal: less pain PT Goal Formulation: With patient/family Time For Goal Achievement: 09/03/24 Potential to Achieve Goals: Poor Progress towards PT goals: Progressing toward goals    Frequency    Min 2X/week      PT Plan      Co-evaluation              AM-PAC PT 6 Clicks Mobility   Outcome Measure  Help needed turning from your back to your side while in a flat bed without using bedrails?: Total Help needed moving from lying on your back to sitting on the side of a flat bed without using bedrails?: Total Help needed moving to and from a bed to a chair (including a wheelchair)?: Total Help needed standing up from a chair using your arms (e.g., wheelchair or bedside chair)?: Total Help needed to walk in hospital room?: Total Help needed climbing 3-5 steps with a railing? : Total 6 Click Score: 6    End of Session Equipment Utilized During Treatment: Gait belt;Other (comment) (stedy) Activity  Tolerance: Patient limited by pain Patient left: in chair;with call bell/phone within reach;with chair alarm set;with family/visitor present Nurse Communication: Mobility status;Need for lift equipment (stedy and +2 assist) PT Visit Diagnosis: Unsteadiness on feet (R26.81);Other abnormalities of gait and mobility (R26.89);Muscle weakness (generalized) (M62.81);Pain Pain - Right/Left: Left Pain - part of body: Hand;Leg     Time: 8442-8371 PT Time Calculation (min) (ACUTE ONLY): 31 min  Charges:    $Therapeutic Activity: 23-37 mins PT General Charges $$ ACUTE PT VISIT: 1 Visit                     Cotey Rakes, PT  Acute Rehab Dept [facility]) [phone]  08/26/2024     [facility] 08/26/2024, 4:59 PM

## 2024-08-26 NOTE — TOC Progression Note (Signed)
 Transition of Care [facility]) - Progression Note    Patient Details  Name: Regina [name] MRN: [id number] Date of Birth: 24-Apr-1933  Transition of Care [facility]) CM/SW Contact  NORMAN ASPEN, LCSW Phone Number: 08/26/2024, 3:05 PM  Clinical Narrative:     Have reviewed SNF bed offers with pt/spouse and bed accepted at [facility] and facility can admit tomorrow.  Alerted by MD that pt may not be medically ready by tomorrow. Will check back in the morning.  Expected Discharge Plan: Skilled Nursing Facility Barriers to Discharge: Continued Medical Work up               Expected Discharge Plan and Services In-house Referral: Clinical Social Work   Post Acute Care Choice: Skilled Nursing Facility Living arrangements for the past 2 months: Single Family Home                 DME Arranged: N/A DME Agency: NA                   Social Drivers of Health (SDOH) Interventions SDOH Screenings   Food Insecurity: No Food Insecurity (08/19/2024)  Housing: Low Risk (08/19/2024)  Transportation Needs: No Transportation Needs (08/19/2024)  Utilities: Not At Risk (08/19/2024)  Social Connections: Unknown (08/19/2024)  Tobacco Use: Low Risk (08/25/2024)    Readmission Risk Interventions     No data to display

## 2024-08-26 NOTE — Progress Notes (Addendum)
 " [facility] Progress Note  Patient: Regina [name]     FMW:[id number]  DOA: 08/19/2024   PCP: Valma Carwin, MD       Brief hospital course: 89 year old female with Parkinson's disease, hypertension, and hypothyroidism who presented to the hospital after a fall.  She had extensive CT imaging and no acute issues were noted.  The patient however was in severe pain and was quite weak.  She complained of pain in her left hip and left lateral rib cage.  She was started on pain medication including OxyContin  10 mg p.o. twice a day and oxycodone  5 mg p.o. every 4 as needed.  A lidocaine  patch was also placed. The pain was controlled however, the patient was noted to be sleepy. After stopping the Oxycontin , she is more alert.  Subjective:  Minimally verbal again today. Moans but otherwise dose not communicate. Husband at bedside states she has been eating as long as he feeds her.   Assessment and Plan: Principal Problem:   Ambulatory dysfunction-Parkinson's disease with parkinson's dementia - In the setting of Parkinson's and an acute fall - She recently saw her primary neurologist who decided that it was best not to increase her Sinemet  - At this point we are searching for skilled nursing facility for her - poor prognosis discussed with husband- will consult palliative care  Active Problems: Fever - noted to have a fever of 101 this morning - WBC 21 today - UA checked today and not abnormal - blood cultures ordered - ? If aspirating- check SLP eval and follow for further temps  - check CXR and resp panel - no signs of cellulitis on exam  Acute left hip and left lateral chest wall pain - Secondary to fall - According to her husband, she is more lethargic than usual-on exam she only briefly opens her eyes-in fact he was feeding her and she was eating with her eyes closed - Holding OxyContin  and managing with as needed oxycodone   Constipation - resolved after fleet enema  Possible  UTI - UA from 1/9 revealed: Few bacteria, greater than 50 WBCs, negative nitrites - No urine culture done - Treated with ceftriaxone  x 3 days  Macrocytic anemia - Vitamin B12 normal at 811 and folate level normal at 17.4 - No iron deficiency noted on lab work  CKD stage IIIa  Elevated TSH - TSH is 12 - free T4 is normal at 1.24 - start Synthroid  25 mcg for subclinical hypothyroidism    Code Status: Limited: Do not attempt resuscitation (DNR) -DNR-LIMITED -Do Not Intubate/DNI  Total time on patient care: 35 minutes DVT prophylaxis:  enoxaparin  (LOVENOX ) injection 30 mg Start: 08/23/24 2200     Objective:   Vitals:   08/25/24 1758 08/25/24 2058 08/26/24 0505 08/26/24 0628  BP: (!) 119/41 (!) 127/54 (!) 122/49   Pulse: 82 84 87   Resp: 16 18 19    Temp: 99.6 F (37.6 C) 98.5 F (36.9 C) (!) 101.2 F (38.4 C) 100 F (37.8 C)  TempSrc:  Oral Oral Oral  SpO2: 95% 93% 94%   Weight:      Height:       Filed Weights   08/19/24 1206 08/19/24 1944  Weight: 65.8 kg 64.1 kg   Exam: General exam: Appears comfortable-eyes open- moans when I lift her left arm- does not answer questions HEENT: oral mucosa moist Respiratory system: Clear to auscultation.  Cardiovascular system: S1 & S2 heard  Gastrointestinal system: Abdomen soft, non-tender, nondistended. Normal  bowel sounds   Extremities: No cyanosis, clubbing or edema Psychiatry:  Mood & affect appropriate.      CBC: Recent Labs  Lab 08/20/24 0542 08/23/24 1156 08/26/24 1047  WBC 9.7 8.1 21.7*  NEUTROABS  --  5.8  --   HGB 10.1* 10.5* 10.4*  HCT 30.3* 31.3* 30.8*  MCV 100.3* 101.6* 99.4  PLT 198 202 188   Basic Metabolic Panel: Recent Labs  Lab 08/20/24 0542 08/23/24 1156  NA 135 133*  K 4.3 5.0  CL 100 98  CO2 25 24  GLUCOSE 71 156*  BUN 22 29*  CREATININE 1.14* 1.25*  CALCIUM 9.2 9.8  MG  --  2.2  PHOS  --  3.5     Scheduled Meds:  carbidopa -levodopa   1 tablet Oral TID   docusate sodium   100 mg  Oral BID   enoxaparin  (LOVENOX ) injection  30 mg Subcutaneous Q24H   folic acid   1 mg Oral Daily   levothyroxine   75 mcg Oral Q0600   lidocaine   1 patch Transdermal Q24H   multivitamin with minerals  1 tablet Oral Daily   senna  1 tablet Oral BID   thiamine   100 mg Oral Daily    Imaging and lab data personally reviewed   Author: Neeka Urista  08/26/2024 1:30 PM  To contact [facility]>   Check the care team in [facility] and look for the attending/consulting TRH provider listed  Log into [email or web address] and use [place]'s universal password   Go to> [facility]  and find provider  If you still have difficulty reaching the provider, please page the [facility] (Director on Call) for the Hospitalists listed on amion     "

## 2024-08-27 DIAGNOSIS — R262 Difficulty in walking, not elsewhere classified: Secondary | ICD-10-CM | POA: Diagnosis not present

## 2024-08-27 LAB — CBC WITH DIFFERENTIAL/PLATELET
Abs Immature Granulocytes: 0.4 K/uL — ABNORMAL HIGH (ref 0.00–0.07)
Basophils Absolute: 0.1 K/uL (ref 0.0–0.1)
Basophils Relative: 0 %
Eosinophils Absolute: 0.1 K/uL (ref 0.0–0.5)
Eosinophils Relative: 0 %
HCT: 27.5 % — ABNORMAL LOW (ref 36.0–46.0)
Hemoglobin: 9.6 g/dL — ABNORMAL LOW (ref 12.0–15.0)
Immature Granulocytes: 2 %
Lymphocytes Relative: 9 %
Lymphs Abs: 2.1 K/uL (ref 0.7–4.0)
MCH: 34.2 pg — ABNORMAL HIGH (ref 26.0–34.0)
MCHC: 34.9 g/dL (ref 30.0–36.0)
MCV: 97.9 fL (ref 80.0–100.0)
Monocytes Absolute: 1.5 K/uL — ABNORMAL HIGH (ref 0.1–1.0)
Monocytes Relative: 6 %
Neutro Abs: 20.1 K/uL — ABNORMAL HIGH (ref 1.7–7.7)
Neutrophils Relative %: 83 %
Platelets: 201 K/uL (ref 150–400)
RBC: 2.81 MIL/uL — ABNORMAL LOW (ref 3.87–5.11)
RDW: 11.9 % (ref 11.5–15.5)
WBC: 24.3 K/uL — ABNORMAL HIGH (ref 4.0–10.5)
nRBC: 0 % (ref 0.0–0.2)

## 2024-08-27 LAB — BASIC METABOLIC PANEL WITH GFR
Anion gap: 9 (ref 5–15)
BUN: 50 mg/dL — ABNORMAL HIGH (ref 8–23)
CO2: 24 mmol/L (ref 22–32)
Calcium: 8.6 mg/dL — ABNORMAL LOW (ref 8.9–10.3)
Chloride: 92 mmol/L — ABNORMAL LOW (ref 98–111)
Creatinine, Ser: 1.65 mg/dL — ABNORMAL HIGH (ref 0.44–1.00)
GFR, Estimated: 29 mL/min — ABNORMAL LOW
Glucose, Bld: 134 mg/dL — ABNORMAL HIGH (ref 70–99)
Potassium: 4.9 mmol/L (ref 3.5–5.1)
Sodium: 125 mmol/L — ABNORMAL LOW (ref 135–145)

## 2024-08-27 LAB — URINE CULTURE: Culture: NO GROWTH

## 2024-08-27 MED ORDER — AZITHROMYCIN 250 MG PO TABS
500.0000 mg | ORAL_TABLET | Freq: Every day | ORAL | Status: DC
  Administered 2024-08-27 – 2024-08-31 (×5): 500 mg via ORAL
  Filled 2024-08-27 (×5): qty 2

## 2024-08-27 MED ORDER — SODIUM CHLORIDE 0.9 % IV SOLN: INTRAVENOUS | Status: AC

## 2024-08-27 MED ORDER — SODIUM CHLORIDE 0.9 % IV SOLN
2.0000 g | INTRAVENOUS | Status: DC
  Administered 2024-08-27 – 2024-08-31 (×5): 2 g via INTRAVENOUS
  Filled 2024-08-27 (×5): qty 20

## 2024-08-27 NOTE — Plan of Care (Signed)
" °  Problem: Education: Goal: Knowledge of General Education information will improve Description: Including pain rating scale, medication(s)/side effects and non-pharmacologic comfort measures 08/27/2024 2301 by Debora Ou, RN Outcome: Not Progressing 08/27/2024 2300 by Debora Ou, RN Outcome: Progressing   "

## 2024-08-27 NOTE — Progress Notes (Signed)
 OT Cancellation Note  Patient Details Name: Regina [name] MRN: [id number] DOB: 1932-12-13   Cancelled Treatment:    Reason Eval/Treat Not Completed: Other (comment) Nursing recommending deferring OT treatment this afternoon.   Leita Howell, OTR/L,CBIS  Supplemental OT - [facility] and [facility] Secure Chat Preferred   08/27/2024, 4:38 PM

## 2024-08-27 NOTE — Evaluation (Signed)
 Clinical/Bedside Swallow Evaluation Patient Details  Name: Regina [name] MRN: [id number] Date of Birth: Aug 04, 1933  Today's Date: 08/27/2024 Time: SLP Start Time (ACUTE ONLY): 0857 SLP Stop Time (ACUTE ONLY): 0912 SLP Time Calculation (min) (ACUTE ONLY): 15 min  Past Medical History:  Past Medical History:  Diagnosis Date   Allergy    Barrett esophagus 08/12/2008   Cancer (HCC)    GERD (gastroesophageal reflux disease)    Hypertension    Thyroid  disease    UTI (urinary tract infection)    Past Surgical History:  Past Surgical History:  Procedure Laterality Date   APPENDECTOMY     COLON SURGERY     COLONOSCOPY     COLONOSCOPY N/A 08/03/2014   Procedure: COLONOSCOPY;  Surgeon: Lamar JONETTA Aho, MD;  Location: [facility] ENDOSCOPY;  Service: Endoscopy;  Laterality: N/A;   POLYPECTOMY     TONSILLECTOMY     TOTAL THYROIDECTOMY     TUBAL LIGATION     HPI:  89 yo female presenting to ED 1/8 after a fall with progressively increased pain. CT C/A/P negative for acute cardiopulmonary findings. UA unremarkable. No acute fxs on spine imaging. SLP evaluated 12/18/23 with functional-appearing swallowing. PMH includes parkinsonism, hypothyroidism, essential HTN    Assessment / Plan / Recommendation  Clinical Impression  Pt's spouse report pt now requires total assistance with feeding but denies concerns related to swallowing. She exhibits no signs clinically concerning for aspiration with large, sequential straw sips. Oral transit is efficient. Continue current diet, ensuring she receives assistance with feeding. SLP f/u is not needed acutely, will sign off. SLP Visit Diagnosis: Dysphagia, unspecified (R13.10)    Aspiration Risk  Mild aspiration risk    Diet Recommendation           Other Recommendations Oral Care Recommendations: Oral care BID     Swallow Evaluation Recommendations Recommendations: PO diet PO Diet Recommendation: Regular;Thin liquids (Level 0) Liquid Administration via:  Cup;Straw Medication Administration: Whole meds with liquid Supervision: Full assist for feeding Swallowing strategies  : Slow rate;Small bites/sips Postural changes: Position pt fully upright for meals Oral care recommendations: Oral care BID (2x/day)   Assistance Recommended at Discharge    Functional Status Assessment Patient has not had a recent decline in their functional status  Frequency and Duration            Prognosis Prognosis for improved oropharyngeal function: Good Barriers to Reach Goals: Cognitive deficits      Swallow Study   General HPI: 89 yo female presenting to ED 1/8 after a fall with progressively increased pain. CT C/A/P negative for acute cardiopulmonary findings. UA unremarkable. No acute fxs on spine imaging. SLP evaluated 12/18/23 with functional-appearing swallowing. PMH includes parkinsonism, hypothyroidism, essential HTN Type of Study: Bedside Swallow Evaluation Previous Swallow Assessment: none in chart Diet Prior to this Study: Regular;Thin liquids (Level 0) Temperature Spikes Noted: No Respiratory Status: Room air History of Recent Intubation: No Behavior/Cognition: Alert;Cooperative;Requires cueing Oral Cavity Assessment: Within Functional Limits Oral Care Completed by SLP: No Oral Cavity - Dentition: Adequate natural dentition Vision: Functional for self-feeding Self-Feeding Abilities: Total assist Patient Positioning: Upright in bed Baseline Vocal Quality: Normal Volitional Cough: Cognitively unable to elicit Volitional Swallow: Unable to elicit    Oral/Motor/Sensory Function Overall Oral Motor/Sensory Function: Within functional limits   Ice Chips Ice chips: Not tested   Thin Liquid Thin Liquid: Within functional limits Presentation: Straw    Nectar Thick Nectar Thick Liquid: Not tested   Honey Thick Honey Thick  Liquid: Not tested   Puree Puree: Within functional limits Presentation: Spoon   Solid     Solid: Within functional  limits Presentation: Carollynn Damien Blumenthal, M.A., CCC-SLP Speech Language Pathology, Acute Rehabilitation Services  Secure Chat preferred [phone]  08/27/2024,9:42 AM

## 2024-08-27 NOTE — Plan of Care (Signed)
" °  Problem: Clinical Measurements: Goal: Ability to maintain clinical measurements within normal limits will improve 08/27/2024 1709 by Sebastian Boyer, RN Outcome: Progressing 08/27/2024 1708 by Sebastian Boyer, RN Outcome: Progressing   "

## 2024-08-27 NOTE — Consult Note (Signed)
 "                                                                                   Consultation Note Date: 08/27/2024   Patient Name: Regina [name]  DOB: [birth year]  MRN: [id number]  Age / Sex: 89 y.o., female  PCP: Valma Carwin, MD Referring Physician: Earley Saucer, MD  Reason for Consultation:  GOC, poor prognosis, parkinson's disease  HPI/Patient Profile: 89 y.o. female  with past medical history of Parkinson's disease with dementia, HTN, hypothyroidism,  admitted on 08/19/2024 with pain in her rib cage after a fall. CT was negative for acute findings. She was started on pain medication- oxycontin10mg  po bid and became somnolent- oxycontin  was stopped and mental status improved. On 1/15 she developed fever and leucocytosis- unclear etiology ?pnuemonia- Palliative medicine consulted for goals of care.     Primary Decision Maker NEXT OF KIN- spouse Suleima Ohlendorf  Discussion: Chest xray from 1/15 personally reviewed- there are nondisplaced fractures at 8 and 9th left rib.  Labs 1/16- CBC 24.3, ANC 20.1. BMET with hyponatermia- 125, Cr trending up 1.65. Per SLP note- no acute findings, no aspiration.  Per attending Dr. Earley note- has been started on antibiotics for pneumonia.  On eval- patient asleep- she woke towards the end of my visit. Was confused, asking for Daddy and telling her husband that she was scared.  Spouse at bedside.  He reports prior to admission patient was able to ambulate some with a walker. Able to feed herself.  We discussed her current hospitalization. He shared her journey and his concerns that patient was overmedicated. He feels her mental status has improved since Oxycontin  has been stopped.  I reviewed chest xray with him regarding nondisplaced rib fractures- he noted that he wasn't aware of the rib fractures- I reviewed CT scan that was done on admission that did not indicate rib fractures, we discussed that sometimes rib fractures can be missed on CT scan.  I  recommended starting her on scheduled tylenol  for assistance with pain control, however, he declined and stated that her neurologist had told him not to give her Tylenol .  I reviewed the need for GOC discussion and ACP discussion. Patient has DNR in place. She is at risk of decompensation, fractured ribs can make it difficult to recover from pneumonia.  [place] shared that he would like his daughter to be present for further discussion.     SUMMARY OF RECOMMENDATIONS -Fall- chest xray from yesterday indicates 8th and 9th rib fractures- continue oxycodone  5mg  po prn- would benefit from scheduled acetaminophen , however, spouse declines -Pneumonia- recovery can be limited by rib fractures inhibiting adequate respiration- need adequate pain management, high risk for decompensation -Acute kidney injury, hyponatremia- Cr is trending up- continue IV fluids -GOC- spouse would like further discussion with PMT provider tomorrow when his daughter can be present - Discussed above with Dr. Earley  Code Status/Advance Care Planning:   Code Status: Limited: Do not attempt resuscitation (DNR) -DNR-LIMITED -Do Not Intubate/DNI     Prognosis:   Unable to determine  Discharge Planning: To Be Determined  Primary Diagnoses: Present on Admission:  Ambulatory dysfunction  Back pain  Chronic diastolic heart failure (HCC)  Essential hypertension  Parkinsonism (HCC)   Review of Systems  Unable to perform ROS: Mental status change    Physical Exam Vitals and nursing note reviewed.  Constitutional:      Appearance: She is ill-appearing.     Comments: frail  Cardiovascular:     Rate and Rhythm: Normal rate.  Pulmonary:     Effort: Pulmonary effort is normal.  Neurological:     Mental Status: She is disoriented.     Vital Signs: BP (!) 133/41 (BP Location: Left Arm)   Pulse 85   Temp 98.6 F (37 C) (Oral)   Resp 15   Ht 5' 6 (1.676 m)   Wt 64.1 kg   SpO2 94%   BMI 22.81 kg/m  Pain Scale:  Faces POSS *See Group Information*: 1-Acceptable,Awake and alert Pain Score: 4    SpO2: SpO2: 94 % O2 Device:SpO2: 94 % O2 Flow Rate: .   IO: Intake/output summary:  Intake/Output Summary (Last 24 hours) at 08/27/2024 1341 Last data filed at 08/27/2024 1000 Gross per 24 hour  Intake 540 ml  Output 0 ml  Net 540 ml    LBM: Last BM Date : 08/26/24 Baseline Weight: Weight: 65.8 kg Most recent weight: Weight: 64.1 kg       Thank you for this consult. Palliative medicine will continue to follow and assist as needed.   Signed by: Cassondra Stain, AGNP-C Palliative Medicine  Time includes:   Billing based on MDM: High  Problems Addressed: One or more chronic illnesses with severe exacerbation, progression, or side effects of treatment. and One acute or chronic illness or injury that poses a threat to life or bodily function  Category 1:Review of prior external note(s) from each unique source, Review of the result(s) of each unique test, and Assessment requiring an independent historian(s) and Category 3:Discussion of management or test interpretation with external physician/other qualified health care professional/appropriate source (not separately reported)   Please contact Palliative Medicine Team phone at [phone] for questions and concerns.  For individual provider: See Amion               "

## 2024-08-27 NOTE — Care Management Important Message (Signed)
 Important Message  Patient Details IM Letter given. Name: Regina [name] MRN: [id number] Date of Birth: 02-08-1933   Important Message Given:  Yes - Medicare IM     Melba Ates 08/27/2024, 3:51 PM

## 2024-08-27 NOTE — Progress Notes (Addendum)
 " [facility] Progress Note  Patient: Regina [name]     FMW:[id number]  DOA: 08/19/2024   PCP: Valma Carwin, MD       Brief hospital course: 89 year old female with Parkinson's disease, hypertension, and hypothyroidism who presented to the hospital after a fall.  She had extensive CT imaging and no acute issues were noted.  The patient however was in severe pain and was quite weak.  She complained of pain in her left hip and left lateral rib cage.  She was started on pain medication including OxyContin  10 mg p.o. twice a day and oxycodone  5 mg p.o. every 4 as needed.  A lidocaine  patch was also placed. The pain was controlled however, the patient was noted to be sleepy. After stopping the Oxycontin , she is more alert.   Subjective:  Non verbal.  Assessment and Plan: Principal Problem:   Ambulatory dysfunction-Parkinson's disease with parkinson's dementia - In the setting of Parkinson's and an acute fall - She recently saw her primary neurologist who decided that it was best not to increase her Sinemet  - minimally verbal and does not follow commands - At this point we are searching for skilled nursing facility for her - poor prognosis discussed with husband- consulted palliative care  Active Problems: Fever- Sepsis - noted to have a fever of 101 on 1/15 - WBC 21  > 24.3 - UA checked and not abnormal - blood cultures ordered - ? If aspirating- or may have aspirated when husband was feeling her while being somnolent-  SLP eval  ordered -  CXR shows b/l basilar infiltrates vs atelectasis-  -  resp panel neg - no signs of cellulitis  - start CTX and Azithromycin  for PNA  Dehydrated, hyponatremia AKI CKD stage IIIa - Na 125, Cr 1.14> 1.65 - start NS infusion  Acute left hip and left lateral chest wall pain - Secondary to fall - According to her husband, she was more lethargic than usual  - Holding OxyContin  and managing with as needed oxycodone - more alert - left 8th and  9th rib fractures noted on xray today   Constipation - resolved after fleet enema  Possible UTI - UA from 1/9 revealed: Few bacteria, greater than 50 WBCs, negative nitrites - No urine culture done - Treated with ceftriaxone  x 3 days  Macrocytic anemia - Vitamin B12 normal at 811 and folate level normal at 17.4 - No iron deficiency noted on lab work  Elevated TSH - TSH is 12 - free T4 is normal at 1.24 - started Synthroid  25 mcg for subclinical hypothyroidism  Disposition: prognosis remains poor. Discussed with husband. Palliative care consulted.     Code Status: Limited: Do not attempt resuscitation (DNR) -DNR-LIMITED -Do Not Intubate/DNI  Total time on patient care: 35 minutes DVT prophylaxis:  enoxaparin  (LOVENOX ) injection 30 mg Start: 08/23/24 2200     Objective:   Vitals:   08/26/24 1338 08/26/24 2354 08/27/24 0550 08/27/24 0656  BP: (!) 107/50 119/68 (!) 121/38 (!) 133/41  Pulse: 80 70 75 85  Resp: 18 15 15    Temp: 99 F (37.2 C) 98.4 F (36.9 C) 98.6 F (37 C)   TempSrc: Oral Oral Oral   SpO2: 98% 90% 94% 94%  Weight:      Height:       Filed Weights   08/19/24 1206 08/19/24 1944  Weight: 65.8 kg 64.1 kg   Exam: General exam: Appears comfortable- being fed by husband HEENT: oral mucosa moist Respiratory system:  Clear to auscultation.  Cardiovascular system: S1 & S2 heard  Gastrointestinal system: Abdomen soft, non-tender, nondistended. Normal bowel sounds   Extremities: No cyanosis, clubbing or edema Psychiatry:  Mood & affect appropriate.      CBC: Recent Labs  Lab 08/23/24 1156 08/26/24 1047 08/27/24 0533  WBC 8.1 21.7* 24.3*  NEUTROABS 5.8  --  20.1*  HGB 10.5* 10.4* 9.6*  HCT 31.3* 30.8* 27.5*  MCV 101.6* 99.4 97.9  PLT 202 188 201   Basic Metabolic Panel: Recent Labs  Lab 08/23/24 1156 08/27/24 0533  NA 133* 125*  K 5.0 4.9  CL 98 92*  CO2 24 24  GLUCOSE 156* 134*  BUN 29* 50*  CREATININE 1.25* 1.65*  CALCIUM 9.8 8.6*   MG 2.2  --   PHOS 3.5  --      Scheduled Meds:  azithromycin   500 mg Oral Daily   carbidopa -levodopa   1 tablet Oral TID   docusate sodium   100 mg Oral BID   enoxaparin  (LOVENOX ) injection  30 mg Subcutaneous Q24H   folic acid   1 mg Oral Daily   levothyroxine   75 mcg Oral Q0600   lidocaine   1 patch Transdermal Q24H   multivitamin with minerals  1 tablet Oral Daily   senna  1 tablet Oral BID   thiamine   100 mg Oral Daily    Imaging and lab data personally reviewed   Author: [place] Nolton  08/27/2024 12:34 PM  To contact [facility]>   Check the care team in [facility] and look for the attending/consulting TRH provider listed  Log into [email or web address] and use 's universal password   Go to> [facility]  and find provider  If you still have difficulty reaching the provider, please page the [facility] (Director on Call) for the Hospitalists listed on amion     "

## 2024-08-27 NOTE — TOC Progression Note (Signed)
 Transition of Care [facility]) - Progression Note    Patient Details  Name: CORLEEN OTWELL MRN: [id number] Date of Birth: [birth year]  Transition [facility]) CM/SW Contact  NORMAN ASPEN, LCSW Phone Number: 08/27/2024, 4:26 PM  Clinical Narrative:     Pt not yet medically ready for SNF discharge.  Have alerted admissions coordinator at [facility].  PMT following as well.    Expected Discharge Plan: Skilled Nursing Facility Barriers to Discharge: Continued Medical Work up               Expected Discharge Plan and Services In-house Referral: Clinical Social Work   Post Acute Care Choice: Skilled Nursing Facility Living arrangements for the past 2 months: Single Family Home                 DME Arranged: N/A DME Agency: NA                   Social Drivers of Health (SDOH) Interventions SDOH Screenings   Food Insecurity: No Food Insecurity (08/19/2024)  Housing: Low Risk (08/19/2024)  Transportation Needs: No Transportation Needs (08/19/2024)  Utilities: Not At Risk (08/19/2024)  Social Connections: Unknown (08/19/2024)  Tobacco Use: Low Risk (08/25/2024)    Readmission Risk Interventions     No data to display

## 2024-08-28 DIAGNOSIS — R262 Difficulty in walking, not elsewhere classified: Secondary | ICD-10-CM | POA: Diagnosis not present

## 2024-08-28 LAB — BASIC METABOLIC PANEL WITH GFR
Anion gap: 8 (ref 5–15)
BUN: 42 mg/dL — ABNORMAL HIGH (ref 8–23)
CO2: 22 mmol/L (ref 22–32)
Calcium: 8 mg/dL — ABNORMAL LOW (ref 8.9–10.3)
Chloride: 97 mmol/L — ABNORMAL LOW (ref 98–111)
Creatinine, Ser: 1.34 mg/dL — ABNORMAL HIGH (ref 0.44–1.00)
GFR, Estimated: 38 mL/min — ABNORMAL LOW
Glucose, Bld: 107 mg/dL — ABNORMAL HIGH (ref 70–99)
Potassium: 4.9 mmol/L (ref 3.5–5.1)
Sodium: 127 mmol/L — ABNORMAL LOW (ref 135–145)

## 2024-08-28 LAB — CBC
HCT: 26.3 % — ABNORMAL LOW (ref 36.0–46.0)
Hemoglobin: 8.8 g/dL — ABNORMAL LOW (ref 12.0–15.0)
MCH: 33 pg (ref 26.0–34.0)
MCHC: 33.5 g/dL (ref 30.0–36.0)
MCV: 98.5 fL (ref 80.0–100.0)
Platelets: 217 K/uL (ref 150–400)
RBC: 2.67 MIL/uL — ABNORMAL LOW (ref 3.87–5.11)
RDW: 11.8 % (ref 11.5–15.5)
WBC: 20.5 K/uL — ABNORMAL HIGH (ref 4.0–10.5)
nRBC: 0 % (ref 0.0–0.2)

## 2024-08-28 MED ORDER — ENSURE PLUS HIGH PROTEIN PO LIQD
237.0000 mL | Freq: Three times a day (TID) | ORAL | Status: DC
  Administered 2024-08-28 – 2024-08-31 (×6): 237 mL via ORAL

## 2024-08-28 NOTE — Plan of Care (Signed)
   Problem: Activity: Goal: Risk for activity intolerance will decrease Outcome: Progressing   Problem: Nutrition: Goal: Adequate nutrition will be maintained Outcome: Progressing   Problem: Pain Managment: Goal: General experience of comfort will improve and/or be controlled Outcome: Progressing   Problem: Safety: Goal: Ability to remain free from injury will improve Outcome: Progressing

## 2024-08-28 NOTE — Progress Notes (Addendum)
 "                                                                                                                                                                                                          Daily Progress Note   Patient Name: Regina [name]       Date: 08/28/2024 DOB: 01-Apr-1933  Age: 89 y.o. MRN#: [id number] Attending Physician: Rizwan, Saima, MD Primary Care Physician: Valma Carwin, MD Admit Date: 08/19/2024  Reason for Consultation/Follow-up: Establishing goals of care   Length of Stay: 8  Current Medications: Scheduled Meds:   azithromycin   500 mg Oral Daily   carbidopa -levodopa   1 tablet Oral TID   docusate sodium   100 mg Oral BID   enoxaparin  (LOVENOX ) injection  30 mg Subcutaneous Q24H   folic acid   1 mg Oral Daily   levothyroxine   75 mcg Oral Q0600   lidocaine   1 patch Transdermal Q24H   multivitamin with minerals  1 tablet Oral Daily   senna  1 tablet Oral BID   thiamine   100 mg Oral Daily    Continuous Infusions:  cefTRIAXone  (ROCEPHIN )  IV 2 g (08/28/24 0804)    PRN Meds: acetaminophen  **OR** acetaminophen , hydrALAZINE , lactulose , ondansetron  **OR** ondansetron  (ZOFRAN ) IV, oxyCODONE   Physical Exam Vitals reviewed.  Constitutional:      General: She is sleeping. She is not in acute distress.    Appearance: She is ill-appearing.  HENT:     Head: Normocephalic and atraumatic.  Cardiovascular:     Rate and Rhythm: Normal rate.  Pulmonary:     Effort: Pulmonary effort is normal.  Skin:    General: Skin is dry.  Neurological:     Mental Status: She is easily aroused. She is disoriented.  Psychiatric:        Mood and Affect: Mood is anxious.             Vital Signs: BP (!) 145/60 (BP Location: Right Arm)   Pulse 66   Temp 97.6 F (36.4 C) (Oral)   Resp 18   Ht 5' 6 (1.676 m)   Wt 64.1 kg   SpO2 98%   BMI 22.81 kg/m  SpO2: SpO2: 98 % O2 Device: O2 Device: Room Air O2 Flow Rate:         Palliative Assessment/Data: 30%      Patient  Active Problem List   Diagnosis Date Noted   Ambulatory dysfunction 08/19/2024   Back pain 08/19/2024   Orthostatic hypotension 12/19/2023   Sinus bradycardia 12/19/2023   Dizziness 12/17/2023  Acute colitis 09/27/2022   Parkinsonism (HCC) 02/14/2022   UTI (urinary tract infection) 04/09/2021   Gait disorder 12/28/2020   Malnutrition of moderate degree 03/10/2018   Acute on chronic diastolic CHF (congestive heart failure) (HCC)    Hip pain    Acute cystitis without hematuria    Delirium    Memory loss    Ataxia 03/09/2018   Weakness generalized 03/09/2018   Acute renal failure superimposed on chronic kidney disease 03/09/2018   Generalized weakness 03/09/2018   Internal hemorrhoids with complication 08/03/2014   Ulcer of lower limb (HCC) 08/02/2014   Chronic diastolic heart failure (HCC) 08/01/2014   Right bundle branch block 08/01/2014   Essential hypertension 08/01/2014   Hypothyroidism 08/01/2014   Rectal bleeding 07/31/2014   Hypertension 07/31/2014   GERD (gastroesophageal reflux disease) 07/31/2014   Other specified anemias 07/31/2014   Lower GI bleeding    Faintness    Syncope 07/30/2014   Varicose veins of bilateral lower extremities with other complications 07/05/2013    Palliative Care Assessment & Plan   Patient Profile: 89 y.o. female  with past medical history of Parkinson's disease with dementia, HTN, hypothyroidism,  admitted on 08/19/2024 with pain in her rib cage after a fall. CT was negative for acute findings. She was started on pain medication- oxycontin10mg  po bid and became somnolent- oxycontin  was stopped and mental status improved. On 1/15 she developed fever and leucocytosis- unclear etiology ?pnuemonia- Palliative medicine consulted for goals of care.   Today's Discussion: Reviewed chart and received update from nursing. Patient lying in bed. Her husband and daughter are at bedside. She is moaning. Her husband began rubbing her foot and she stopped  moaning. Later she began asking for help and asking her husband not to leave. She told her daughter her pain was everywhere. Requested prn tylenol  from nursing.  Reviewed patient's medical history and acute hospitalization with her husband and daughter.  The patient lives with her husband is 87 years and 69 year old son.  Her husband is her proxy education officer, environmental.  At baseline the patient uses a walker to move around.  She is able to feed herself but requires assistance for other ADLs from her husband.  The patient's family notes her decline since her fall.  She has not been able to feed herself since a week ago.  Her husband tells me she has made little progress with PT. I shared my worry that this may be the patient's new normal.  I shared my worry that her pain level might be keeping her from working with therapies.  Patient's family shared that she has never like to take pain medications.  Husband says the neurologist requested the patient decrease the amount of daily Tylenol .  Family is concerned about patient taking any opioids. Discussed safety of opioids if used appropriately.  The patient's family shared they are hopeful she will be able to DC to SNF with rehab.    We discussed advanced directives.  The patient completed advanced care planning documents over 30 years ago.  Requested copy from patient's husband so we could review and ensure they are still accurate.  We discussed the differences between an aggressive treatment pathway and a comfort focused path.  Patient's husband confirms the patient's DNR/DNI status.  Patient's husband tells me that if the patient knew she were not going to get better she would want him to let her go.   Patient's husband asked if there were providers like PMT in the outpatient setting. Will  place TOC order for OP Palliative at discharge.  Emotional support and therapeutic listening provided.  Gave patient's family Hard Choices booklet for review.  Encouraged  family to call PMT with questions or concerns.  PMT will continue to follow.  Recommendations/Plan: DNR/DNI Continue treating the treatable Husband going to look for Living will document Family hopeful patient will be able to dc to SNF with rehab OP Palliative at discharge- TOC order placed Encouraged continued discussions re: goals of care PMT will continue to support   Code Status:    Code Status Orders  (From admission, onward)           Start     Ordered   08/19/24 1857  Do not attempt resuscitation (DNR)- Limited -Do Not Intubate (DNI)  Continuous       Question Answer Comment  If pulseless and not breathing No CPR or chest compressions.   In Pre-Arrest Conditions (Patient Is Breathing and Has A Pulse) Do not intubate. Provide all appropriate non-invasive medical interventions. Avoid ICU transfer unless indicated or required.   Consent: Discussion documented in EHR or advanced directives reviewed      08/19/24 1857         Extensive chart review has been completed prior to seeing the patient including labs, vital signs, imaging, progress/consult notes, orders, medications, and available advance directive documents.  Care plan was discussed with bedside RN and Dr. Earley  Time spent: 70 minutes  Thank you for allowing the Palliative Medicine Team to assist in the care of this patient.    Stephane CHRISTELLA Palin, NP  Please contact Palliative Medicine Team phone at [phone] for questions and concerns.       "

## 2024-08-28 NOTE — Plan of Care (Signed)
   Problem: Education: Goal: Knowledge of General Education information will improve Description Including pain rating scale, medication(s)/side effects and non-pharmacologic comfort measures Outcome: Progressing

## 2024-08-29 DIAGNOSIS — R262 Difficulty in walking, not elsewhere classified: Secondary | ICD-10-CM | POA: Diagnosis not present

## 2024-08-29 LAB — COMPREHENSIVE METABOLIC PANEL WITH GFR
ALT: 10 U/L (ref 0–44)
AST: 66 U/L — ABNORMAL HIGH (ref 15–41)
Albumin: 2.6 g/dL — ABNORMAL LOW (ref 3.5–5.0)
Alkaline Phosphatase: 152 U/L — ABNORMAL HIGH (ref 38–126)
Anion gap: 8 (ref 5–15)
BUN: 34 mg/dL — ABNORMAL HIGH (ref 8–23)
CO2: 21 mmol/L — ABNORMAL LOW (ref 22–32)
Calcium: 8.5 mg/dL — ABNORMAL LOW (ref 8.9–10.3)
Chloride: 97 mmol/L — ABNORMAL LOW (ref 98–111)
Creatinine, Ser: 1.08 mg/dL — ABNORMAL HIGH (ref 0.44–1.00)
GFR, Estimated: 49 mL/min — ABNORMAL LOW
Glucose, Bld: 102 mg/dL — ABNORMAL HIGH (ref 70–99)
Potassium: 4.8 mmol/L (ref 3.5–5.1)
Sodium: 126 mmol/L — ABNORMAL LOW (ref 135–145)
Total Bilirubin: 0.3 mg/dL (ref 0.0–1.2)
Total Protein: 6 g/dL — ABNORMAL LOW (ref 6.5–8.1)

## 2024-08-29 LAB — CBC
HCT: 31.2 % — ABNORMAL LOW (ref 36.0–46.0)
Hemoglobin: 10.5 g/dL — ABNORMAL LOW (ref 12.0–15.0)
MCH: 33.2 pg (ref 26.0–34.0)
MCHC: 33.7 g/dL (ref 30.0–36.0)
MCV: 98.7 fL (ref 80.0–100.0)
Platelets: 271 K/uL (ref 150–400)
RBC: 3.16 MIL/uL — ABNORMAL LOW (ref 3.87–5.11)
RDW: 11.8 % (ref 11.5–15.5)
WBC: 17.6 K/uL — ABNORMAL HIGH (ref 4.0–10.5)
nRBC: 0 % (ref 0.0–0.2)

## 2024-08-29 LAB — OSMOLALITY: Osmolality: 287 mosm/kg (ref 275–295)

## 2024-08-29 LAB — OSMOLALITY, URINE: Osmolality, Ur: 203 mosm/kg — ABNORMAL LOW (ref 300–900)

## 2024-08-29 LAB — SODIUM, URINE, RANDOM: Sodium, Ur: <30 mmol/L

## 2024-08-29 MED ORDER — ENOXAPARIN SODIUM 40 MG/0.4ML IJ SOSY
40.0000 mg | PREFILLED_SYRINGE | INTRAMUSCULAR | Status: DC
  Administered 2024-08-29 – 2024-08-30 (×2): 40 mg via SUBCUTANEOUS
  Filled 2024-08-29 (×2): qty 0.4

## 2024-08-29 MED ORDER — SODIUM CHLORIDE 0.9 % IV SOLN: INTRAVENOUS | Status: DC

## 2024-08-29 NOTE — Plan of Care (Signed)
" °  Problem: Elimination: Goal: Will not experience complications related to urinary retention Outcome: Progressing   Problem: Pain Managment: Goal: General experience of comfort will improve and/or be controlled Outcome: Progressing   Problem: Safety: Goal: Ability to remain free from injury will improve Outcome: Progressing   Problem: Skin Integrity: Goal: Risk for impaired skin integrity will decrease Outcome: Not Progressing   "

## 2024-08-29 NOTE — Progress Notes (Signed)
 " [facility] Progress Note  Patient: Regina [name]     FMW:[id number]  DOA: 08/19/2024   PCP: Valma Carwin, MD       Brief hospital course: 89 year old female with Parkinson's disease, hypertension, and hypothyroidism who presented to the hospital after a fall.  She had extensive CT imaging and no acute issues were noted.  The patient however was in severe pain and was quite weak.  She complained of pain in her left hip and left lateral rib cage.  She was started on pain medication including OxyContin  10 mg p.o. twice a day and oxycodone  5 mg p.o. every 4 as needed.  A lidocaine  patch was also placed. The pain was controlled however, the patient was noted to be sleepy. After stopping the Oxycontin , she is more alert.   Subjective:  Complaining of not feeling well but cannot elaborate. Cryging.   Assessment and Plan: Principal Problem:   Ambulatory dysfunction-Parkinson's disease with parkinson's dementia - In the setting of Parkinson's and an acute fall - She recently saw her primary neurologist who decided that it was best not to increase her Sinemet  - minimally verbal and does not follow commands - At this point we are searching for skilled nursing facility for her - poor prognosis discussed with husband- consulted palliative care  Active Problems: Fever- Sepsis - noted to have a fever of 101 on 1/15 - WBC 21  > 24.3 - UA checked and not abnormal - blood cultures ordered - ? If aspirating- or may have aspirated when husband was feeling her while being somnolent-  SLP eval  ordered- she did well  -  CXR shows b/l basilar infiltrates vs atelectasis-  -  resp panel neg - no signs of cellulitis  - cont CTX and Azithromycin  for PNA  Dehydrated, hyponatremia AKI CKD stage IIIa - Na 125, Cr 1.14> 1.65> 1.34 - cont NS infusion  Acute left hip and left lateral chest wall pain - Secondary to fall - According to her husband, she was more lethargic than usual  - Holding  OxyContin  and managing with as needed oxycodone - more alert - left 8th and 9th rib fractures noted on xray    Constipation - resolved after fleet enema  Possible UTI - UA from 1/9 revealed: Few bacteria, greater than 50 WBCs, negative nitrites - No urine culture done - Treated with ceftriaxone  x 3 days  Macrocytic anemia - Vitamin B12 normal at 811 and folate level normal at 17.4 - No iron deficiency noted on lab work  Elevated TSH - TSH is 12 - free T4 is normal at 1.24 - started Synthroid  25 mcg for subclinical hypothyroidism  Disposition: prognosis remains poor. Discussed with husband. Palliative care consulted.     Code Status: Limited: Do not attempt resuscitation (DNR) -DNR-LIMITED -Do Not Intubate/DNI  Total time on patient care: 35 minutes DVT prophylaxis:  enoxaparin  (LOVENOX ) injection 40 mg Start: 08/29/24 2200     Objective:   Vitals:   08/28/24 1408 08/28/24 2009 08/29/24 0448 08/29/24 1312  BP: (!) 125/49 (!) 150/55 (!) 149/57 (!) 141/50  Pulse: 70 70 64 71  Resp: 14 18 18 18   Temp: 97.8 F (36.6 C) 98.6 F (37 C) (!) 97.5 F (36.4 C) 97.7 F (36.5 C)  TempSrc: Oral Oral Oral Oral  SpO2: 98% 100% 99% 100%  Weight:      Height:       Filed Weights   08/19/24 1206 08/19/24 1944  Weight: 65.8 kg 64.1  kg   Exam: General exam: Appears comfortable- being fed by husband HEENT: oral mucosa moist Respiratory system: Clear to auscultation.  Cardiovascular system: S1 & S2 heard  Gastrointestinal system: Abdomen soft, non-tender, nondistended. Normal bowel sounds   Extremities: No cyanosis, clubbing or edema Psychiatry:  Mood & affect appropriate.      CBC: Recent Labs  Lab 08/23/24 1156 08/26/24 1047 08/27/24 0533 08/28/24 0522 08/29/24 0513  WBC 8.1 21.7* 24.3* 20.5* 17.6*  NEUTROABS 5.8  --  20.1*  --   --   HGB 10.5* 10.4* 9.6* 8.8* 10.5*  HCT 31.3* 30.8* 27.5* 26.3* 31.2*  MCV 101.6* 99.4 97.9 98.5 98.7  PLT 202 188 201 217 271   Basic  Metabolic Panel: Recent Labs  Lab 08/23/24 1156 08/27/24 0533 08/28/24 0522 08/29/24 0513  NA 133* 125* 127* 126*  K 5.0 4.9 4.9 4.8  CL 98 92* 97* 97*  CO2 24 24 22  21*  GLUCOSE 156* 134* 107* 102*  BUN 29* 50* 42* 34*  CREATININE 1.25* 1.65* 1.34* 1.08*  CALCIUM 9.8 8.6* 8.0* 8.5*  MG 2.2  --   --   --   PHOS 3.5  --   --   --      Scheduled Meds:  azithromycin   500 mg Oral Daily   carbidopa -levodopa   1 tablet Oral TID   docusate sodium   100 mg Oral BID   enoxaparin  (LOVENOX ) injection  40 mg Subcutaneous Q24H   feeding supplement  237 mL Oral TID BM   folic acid   1 mg Oral Daily   levothyroxine   75 mcg Oral Q0600   lidocaine   1 patch Transdermal Q24H   multivitamin with minerals  1 tablet Oral Daily   senna  1 tablet Oral BID   thiamine   100 mg Oral Daily    Imaging and lab data personally reviewed   Author: Danelly Hassinger  08/28/2024 3:34 PM  To contact [facility]>   Check the care team in [facility] and look for the attending/consulting TRH provider listed  Log into [email or web address] and use [facility] universal password   Go to> [facility]  and find provider  If you still have difficulty reaching the provider, please page the [facility] (Director on Call) for the Hospitalists listed on amion     "

## 2024-08-29 NOTE — TOC Progression Note (Addendum)
 Transition of Care [facility]) - Progression Note    Patient Details  Name: WESTYN KEATLEY MRN: [id number] Date of Birth: 1933/06/28  [facility] CM/SW Contact  Doneta Glenys DASEN, RN Phone Number: 08/29/2024, 4:06 PM  Clinical Narrative:    CM met with Dosh,James Spouse, in the room. CM gave a list of Hospice/Palliative Care agencies from Medicare.gov for outpatient to consider for choice.Allowed for questions and all questions answered. Waiting on choice.   Expected Discharge Plan: Skilled Nursing Facility Barriers to Discharge: Continued Medical Work up               Expected Discharge Plan and Services In-house Referral: Clinical Social Work   Post Acute Care Choice: Skilled Nursing Facility Living arrangements for the past 2 months: Single Family Home                 DME Arranged: N/A DME Agency: NA                   Social Drivers of Health (SDOH) Interventions SDOH Screenings   Food Insecurity: No Food Insecurity (08/19/2024)  Housing: Low Risk (08/19/2024)  Transportation Needs: No Transportation Needs (08/19/2024)  Utilities: Not At Risk (08/19/2024)  Social Connections: Unknown (08/19/2024)  Tobacco Use: Low Risk (08/25/2024)    Readmission Risk Interventions     No data to display

## 2024-08-29 NOTE — Progress Notes (Signed)
 "                                                                                                                                                                                                          Daily Progress Note   Patient Name: Regina [name]       Date: 08/29/2024 DOB: May 10, 1933  Age: 89 y.o. MRN#: [id number] Attending Physician: Rizwan, Saima, MD Primary Care Physician: Valma Carwin, MD Admit Date: 08/19/2024  Reason for Consultation/Follow-up: Establishing goals of care   Length of Stay: 9  Current Medications: Scheduled Meds:   azithromycin   500 mg Oral Daily   carbidopa -levodopa   1 tablet Oral TID   docusate sodium   100 mg Oral BID   enoxaparin  (LOVENOX ) injection  40 mg Subcutaneous Q24H   feeding supplement  237 mL Oral TID BM   folic acid   1 mg Oral Daily   levothyroxine   75 mcg Oral Q0600   lidocaine   1 patch Transdermal Q24H   multivitamin with minerals  1 tablet Oral Daily   senna  1 tablet Oral BID   thiamine   100 mg Oral Daily    Continuous Infusions:  cefTRIAXone  (ROCEPHIN )  IV 2 g (08/29/24 0751)    PRN Meds: acetaminophen  **OR** acetaminophen , hydrALAZINE , lactulose , ondansetron  **OR** ondansetron  (ZOFRAN ) IV, oxyCODONE   Physical Exam Vitals reviewed.  Constitutional:      General: She is awake. She is not in acute distress.    Appearance: She is ill-appearing.  HENT:     Head: Normocephalic and atraumatic.  Cardiovascular:     Rate and Rhythm: Normal rate.  Pulmonary:     Effort: Pulmonary effort is normal.  Skin:    General: Skin is warm and dry.  Psychiatric:        Mood and Affect: Mood is anxious.             Vital Signs: BP (!) 141/50 (BP Location: Right Arm)   Pulse 71   Temp 97.7 F (36.5 C) (Oral)   Resp 18   Ht 5' 6 (1.676 m)   Wt 64.1 kg   SpO2 100%   BMI 22.81 kg/m  SpO2: SpO2: 100 % O2 Device: O2 Device: Room Air O2 Flow Rate:         Palliative Assessment/Data: 30%      Patient Active Problem List    Diagnosis Date Noted   Ambulatory dysfunction 08/19/2024   Back pain 08/19/2024   Orthostatic hypotension 12/19/2023   Sinus bradycardia 12/19/2023   Dizziness 12/17/2023   Acute colitis  09/27/2022   Parkinsonism (HCC) 02/14/2022   UTI (urinary tract infection) 04/09/2021   Gait disorder 12/28/2020   Malnutrition of moderate degree 03/10/2018   Acute on chronic diastolic CHF (congestive heart failure) (HCC)    Hip pain    Acute cystitis without hematuria    Delirium    Memory loss    Ataxia 03/09/2018   Weakness generalized 03/09/2018   Acute renal failure superimposed on chronic kidney disease 03/09/2018   Generalized weakness 03/09/2018   Internal hemorrhoids with complication 08/03/2014   Ulcer of lower limb (HCC) 08/02/2014   Chronic diastolic heart failure (HCC) 08/01/2014   Right bundle branch block 08/01/2014   Essential hypertension 08/01/2014   Hypothyroidism 08/01/2014   Rectal bleeding 07/31/2014   Hypertension 07/31/2014   GERD (gastroesophageal reflux disease) 07/31/2014   Other specified anemias 07/31/2014   Lower GI bleeding    Faintness    Syncope 07/30/2014   Varicose veins of bilateral lower extremities with other complications 07/05/2013    Palliative Care Assessment & Plan   Patient Profile: 89 y.o. female  with past medical history of Parkinson's disease with dementia, HTN, hypothyroidism,  admitted on 08/19/2024 with pain in her rib cage after a fall. CT was negative for acute findings. She was started on pain medication- oxycontin10mg  po bid and became somnolent- oxycontin  was stopped and mental status improved. On 1/15 she developed fever and leucocytosis- unclear etiology ?pnuemonia- Palliative medicine consulted for goals of care.   Today's Discussion: Reviewed chart and received update from nursing. Patient lying in bed with her glasses on watching television.  She continues to have pain but has a hard time articulating where it is.  Nursing is  bringing oxycodone .  Patient's husband is at bedside.  Patient's husband shared that patient's appetite has been good and she drank an Ensure today. She is still requiring assistance with feeding which is not her baseline.  Patient's husband is worried about this.  I tried to encourage her to feed herself when her meal comes in encouraged her husband to do the same. We discussed the importance of the patient working with physical therapy and occupational therapy.  I shared with the patient's husband that I think this will be difficult for her without pain medication on board.  Their goal remains treating the treatable and allowing time for outcomes.  They are hopeful patient will be able to go to rehab.   MOST form completed today. The patient's husband outlined their wishes for the following treatment decisions:  Cardiopulmonary Resuscitation: Do Not Attempt Resuscitation (DNR/No CPR)  Medical Interventions: Limited Additional Interventions: Use medical treatment, IV fluids and cardiac monitoring as indicated, DO NOT USE intubation or mechanical ventilation. May consider use of less invasive airway support such as BiPAP or CPAP. Also provide comfort measures. Transfer to the hospital if indicated. Avoid intensive care.   Antibiotics: Antibiotics if indicated  IV Fluids: IV fluids for a defined trial period  Feeding Tube: No feeding tube    Emotional support and therapeutic listening provided.  Gave patient's family Hard Choices booklet for review.  Encouraged family to call PMT with questions or concerns.  PMT will continue to follow.  Recommendations/Plan: DNR/DNI Continue treating the treatable MOST form completed and placed in paper chart Family hopeful patient will be able to dc to SNF with rehab OP Palliative at discharge- TOC order placed Encouraged continued discussions re: goals of care PMT will continue to support   Code Status:    Code  Status Orders  (From admission,  onward)           Start     Ordered   08/19/24 1857  Do not attempt resuscitation (DNR)- Limited -Do Not Intubate (DNI)  Continuous       Question Answer Comment  If pulseless and not breathing No CPR or chest compressions.   In Pre-Arrest Conditions (Patient Is Breathing and Has A Pulse) Do not intubate. Provide all appropriate non-invasive medical interventions. Avoid ICU transfer unless indicated or required.   Consent: Discussion documented in EHR or advanced directives reviewed      08/19/24 1857         Extensive chart review has been completed prior to seeing the patient including labs, vital signs, imaging, progress/consult notes, orders, medications, and available advance directive documents.  Care plan was discussed with bedside RN and Dr. Earley  Time spent: 50 minutes  Thank you for allowing the Palliative Medicine Team to assist in the care of this patient.    Stephane CHRISTELLA Palin, NP  Please contact Palliative Medicine Team phone at [phone] for questions and concerns.       "

## 2024-08-29 NOTE — Progress Notes (Signed)
 " [facility] Progress Note  Patient: Regina [name]     FMW:[id number]  DOA: 08/19/2024   PCP: Valma Carwin, MD       Brief hospital course: 89 year old female with Parkinson's disease, hypertension, and hypothyroidism who presented to the hospital after a fall.  She had extensive CT imaging and no acute issues were noted.  The patient however was in severe pain and was quite weak.  She complained of pain in her left hip and left lateral rib cage.  She was started on pain medication including OxyContin  10 mg p.o. twice a day and oxycodone  5 mg p.o. every 4 as needed.  A lidocaine  patch was also placed. The pain was controlled however, the patient was noted to be sleepy. After stopping the Oxycontin , she is more alert.   Subjective:  She has no complaints.   Assessment and Plan: Principal Problem:   Ambulatory dysfunction-Parkinson's disease with parkinson's dementia - In the setting of Parkinson's and an acute fall - She recently saw her primary neurologist who decided that it was best not to increase her Sinemet  - minimally verbal and does not follow commands - At this point we are searching for skilled nursing facility for her - appreciate palliative care f/u  Active Problems: Fever- Sepsis due to pneumonia  - noted to have a fever of 101 on 1/15 - WBC 21  > 24.3 - UA checked and not abnormal - blood cultures ordered - ? If aspirating- or may have aspirated when husband was feeling her while being somnolent-  SLP eval  ordered- she did well  -  CXR shows b/l basilar infiltrates vs atelectasis-  -  resp panel neg - no signs of cellulitis  - cont CTX and Azithromycin  for PNA - WBC improving  Dehydrated, hyponatremia AKI CKD stage IIIa - Na 125, Cr 1.14> 1.65> 1.34 - cont NS infusion - Cr improved to 1.08 but sodium still quite low  Acute left hip and left lateral chest wall pain - Secondary to fall - According to her husband, she was more lethargic than usual  -  Holding OxyContin  and managing with as needed oxycodone - more alert - left 8th and 9th rib fractures noted on xray    Constipation - resolved after fleet enema  Possible UTI - UA from 1/9 revealed: Few bacteria, greater than 50 WBCs, negative nitrites - No urine culture done - Treated with ceftriaxone  x 3 days  Macrocytic anemia - Vitamin B12 normal at 811 and folate level normal at 17.4 - No iron deficiency noted on lab work  Elevated TSH - TSH is 12 - free T4 is normal at 1.24 -     Disposition: prognosis remains poor. Discussed with husband. Palliative care consulted.     Code Status: Limited: Do not attempt resuscitation (DNR) -DNR-LIMITED -Do Not Intubate/DNI  Total time on patient care: 35 minutes DVT prophylaxis:  enoxaparin  (LOVENOX ) injection 40 mg Start: 08/29/24 2200     Objective:   Vitals:   08/28/24 1408 08/28/24 2009 08/29/24 0448 08/29/24 1312  BP: (!) 125/49 (!) 150/55 (!) 149/57 (!) 141/50  Pulse: 70 70 64 71  Resp: 14 18 18 18   Temp: 97.8 F (36.6 C) 98.6 F (37 C) (!) 97.5 F (36.4 C) 97.7 F (36.5 C)  TempSrc: Oral Oral Oral Oral  SpO2: 98% 100% 99% 100%  Weight:      Height:       Filed Weights   08/19/24 1206 08/19/24 1944  Weight: 65.8 kg 64.1 kg   Exam: General exam: Appears comfortable-  HEENT: oral mucosa moist Respiratory system: Clear to auscultation.  Cardiovascular system: S1 & S2 heard  Gastrointestinal system: Abdomen soft, non-tender, nondistended. Normal bowel sounds   Extremities: No cyanosis, clubbing or edema Psychiatry:  Mood & affect appropriate.      CBC: Recent Labs  Lab 08/23/24 1156 08/26/24 1047 08/27/24 0533 08/28/24 0522 08/29/24 0513  WBC 8.1 21.7* 24.3* 20.5* 17.6*  NEUTROABS 5.8  --  20.1*  --   --   HGB 10.5* 10.4* 9.6* 8.8* 10.5*  HCT 31.3* 30.8* 27.5* 26.3* 31.2*  MCV 101.6* 99.4 97.9 98.5 98.7  PLT 202 188 201 217 271   Basic Metabolic Panel: Recent Labs  Lab 08/23/24 1156  08/27/24 0533 08/28/24 0522 08/29/24 0513  NA 133* 125* 127* 126*  K 5.0 4.9 4.9 4.8  CL 98 92* 97* 97*  CO2 24 24 22  21*  GLUCOSE 156* 134* 107* 102*  BUN 29* 50* 42* 34*  CREATININE 1.25* 1.65* 1.34* 1.08*  CALCIUM 9.8 8.6* 8.0* 8.5*  MG 2.2  --   --   --   PHOS 3.5  --   --   --      Scheduled Meds:  azithromycin   500 mg Oral Daily   carbidopa -levodopa   1 tablet Oral TID   docusate sodium   100 mg Oral BID   enoxaparin  (LOVENOX ) injection  40 mg Subcutaneous Q24H   feeding supplement  237 mL Oral TID BM   folic acid   1 mg Oral Daily   levothyroxine   75 mcg Oral Q0600   lidocaine   1 patch Transdermal Q24H   multivitamin with minerals  1 tablet Oral Daily   senna  1 tablet Oral BID   thiamine   100 mg Oral Daily    Imaging and lab data personally reviewed   Author: Kenan Moodie  08/28/2024 5:18 PM  To contact [facility]>   Check the care team in [facility] and look for the attending/consulting [facility] provider listed  Log into [email or web address] and use [place]'s universal password   Go to> [facility]  and find provider  If you still have difficulty reaching the provider, please page the [facility] (Director on Call) for the Hospitalists listed on amion     "

## 2024-08-30 DIAGNOSIS — R262 Difficulty in walking, not elsewhere classified: Secondary | ICD-10-CM | POA: Diagnosis not present

## 2024-08-30 LAB — CBC
HCT: 32.1 % — ABNORMAL LOW (ref 36.0–46.0)
Hemoglobin: 10.8 g/dL — ABNORMAL LOW (ref 12.0–15.0)
MCH: 33.4 pg (ref 26.0–34.0)
MCHC: 33.6 g/dL (ref 30.0–36.0)
MCV: 99.4 fL (ref 80.0–100.0)
Platelets: 300 K/uL (ref 150–400)
RBC: 3.23 MIL/uL — ABNORMAL LOW (ref 3.87–5.11)
RDW: 11.8 % (ref 11.5–15.5)
WBC: 12.4 K/uL — ABNORMAL HIGH (ref 4.0–10.5)
nRBC: 0 % (ref 0.0–0.2)

## 2024-08-30 LAB — BASIC METABOLIC PANEL WITH GFR
Anion gap: 8 (ref 5–15)
BUN: 24 mg/dL — ABNORMAL HIGH (ref 8–23)
CO2: 23 mmol/L (ref 22–32)
Calcium: 8.7 mg/dL — ABNORMAL LOW (ref 8.9–10.3)
Chloride: 103 mmol/L (ref 98–111)
Creatinine, Ser: 1.02 mg/dL — ABNORMAL HIGH (ref 0.44–1.00)
GFR, Estimated: 52 mL/min — ABNORMAL LOW
Glucose, Bld: 156 mg/dL — ABNORMAL HIGH (ref 70–99)
Potassium: 5.1 mmol/L (ref 3.5–5.1)
Sodium: 133 mmol/L — ABNORMAL LOW (ref 135–145)

## 2024-08-30 MED ORDER — POLYVINYL ALCOHOL 1.4 % OP SOLN
1.0000 [drp] | OPHTHALMIC | Status: DC | PRN
  Filled 2024-08-30: qty 15

## 2024-08-30 NOTE — Plan of Care (Signed)
   Problem: Education: Goal: Knowledge of General Education information will improve Description Including pain rating scale, medication(s)/side effects and non-pharmacologic comfort measures Outcome: Progressing   Problem: Health Behavior/Discharge Planning: Goal: Ability to manage health-related needs will improve Outcome: Progressing

## 2024-08-30 NOTE — Progress Notes (Signed)
 " [facility] Progress Note  Patient: Regina [name]     FMW:[id number]  DOA: 08/19/2024   PCP: Valma Carwin, MD       Brief hospital course: 89 year old female with Parkinson's disease, hypertension, and hypothyroidism who presented to the hospital after a fall.  She had extensive CT imaging and no acute issues were noted.  The patient however was in severe pain and was quite weak.  She complained of pain in her left hip and left lateral rib cage.  She was started on pain medication including OxyContin  10 mg p.o. twice a day and oxycodone  5 mg p.o. every 4 as needed.  A lidocaine  patch was also placed. The pain was controlled however, the patient was noted to be sleepy. After stopping the Oxycontin , she is more alert.   Subjective:  No complaints.   Assessment and Plan: Principal Problem:   Ambulatory dysfunction-Parkinson's disease with parkinson's dementia - In the setting of Parkinson's and an acute fall - She recently saw her primary neurologist who decided that it was best not to increase her Sinemet  - minimally verbal and does not follow commands - At this point we are searching for skilled nursing facility for her - appreciate palliative care f/u  Active Problems: Fever- Sepsis due to pneumonia  - noted to have a fever of 101 on 1/15 - WBC 21  > 24.3 - UA checked and not abnormal - blood cultures ordered - ? If aspirating- or may have aspirated when husband was feeling her while being somnolent-  SLP eval  ordered- she did well  -  CXR shows b/l basilar infiltrates vs atelectasis-  -  resp panel neg - no signs of cellulitis  - cont CTX and Azithromycin  for PNA - WBC improving  Lethargy  - much more alert today after receiving more IVF  Dehydrated, hyponatremia AKI CKD stage IIIa - Na 125, Cr 1.14> 1.65> 1.34> 2.02 - will stop IVF today- encouraged oral intake  Acute left hip and left lateral chest wall pain - Secondary to fall - According to her husband, she  was more lethargic than usual  - Holding OxyContin  and managing with as needed oxycodone - more alert - left 8th and 9th rib fractures noted on xray    Constipation - resolved after fleet enema  Possible UTI - UA from 1/9 revealed: Few bacteria, greater than 50 WBCs, negative nitrites - No urine culture done - Treated with ceftriaxone  x 3 days  Macrocytic anemia - Vitamin B12 normal at 811 and folate level normal at 17.4 - No iron deficiency noted on lab work  Elevated TSH - TSH is 12 - free T4 is normal at 1.24 -     Disposition: prognosis remains poor. Discussed with husband. Palliative care consulted.  Will dc once SNF bed available.     Code Status: Limited: Do not attempt resuscitation (DNR) -DNR-LIMITED -Do Not Intubate/DNI  Total time on patient care: 35 minutes DVT prophylaxis:  enoxaparin  (LOVENOX ) injection 40 mg Start: 08/29/24 2200     Objective:   Vitals:   08/29/24 1312 08/29/24 2107 08/30/24 0423 08/30/24 1412  BP: (!) 141/50 (!) 168/53 (!) 169/59 (!) 159/55  Pulse: 71 82 77 76  Resp: 18 18 18    Temp: 97.7 F (36.5 C) 98.6 F (37 C) 98.4 F (36.9 C) 98.2 F (36.8 C)  TempSrc: Oral Oral Oral Oral  SpO2: 100% 98% 99% 97%  Weight:      Height:  Filed Weights   08/19/24 1206 08/19/24 1944  Weight: 65.8 kg 64.1 kg   Exam: General exam: Appears comfortable-  HEENT: oral mucosa moist Respiratory system: Clear to auscultation.  Cardiovascular system: S1 & S2 heard  Gastrointestinal system: Abdomen soft, non-tender, nondistended. Normal bowel sounds   Extremities: No cyanosis, clubbing or edema Psychiatry:  Mood & affect appropriate.      CBC: Recent Labs  Lab 08/26/24 1047 08/27/24 0533 08/28/24 0522 08/29/24 0513 08/30/24 0829  WBC 21.7* 24.3* 20.5* 17.6* 12.4*  NEUTROABS  --  20.1*  --   --   --   HGB 10.4* 9.6* 8.8* 10.5* 10.8*  HCT 30.8* 27.5* 26.3* 31.2* 32.1*  MCV 99.4 97.9 98.5 98.7 99.4  PLT 188 201 217 271 300   Basic  Metabolic Panel: Recent Labs  Lab 08/27/24 0533 08/28/24 0522 08/29/24 0513 08/30/24 1217  NA 125* 127* 126* 133*  K 4.9 4.9 4.8 5.1  CL 92* 97* 97* 103  CO2 24 22 21* 23  GLUCOSE 134* 107* 102* 156*  BUN 50* 42* 34* 24*  CREATININE 1.65* 1.34* 1.08* 1.02*  CALCIUM 8.6* 8.0* 8.5* 8.7*     Scheduled Meds:  azithromycin   500 mg Oral Daily   carbidopa -levodopa   1 tablet Oral TID   docusate sodium   100 mg Oral BID   enoxaparin  (LOVENOX ) injection  40 mg Subcutaneous Q24H   feeding supplement  237 mL Oral TID BM   folic acid   1 mg Oral Daily   levothyroxine   75 mcg Oral Q0600   lidocaine   1 patch Transdermal Q24H   multivitamin with minerals  1 tablet Oral Daily   senna  1 tablet Oral BID   thiamine   100 mg Oral Daily    Imaging and lab data personally reviewed   Author: Manali Mcelmurry  08/28/2024 4:21 PM  To contact [facility]>   Check the care team in [facility] and look for the attending/consulting [facility] provider listed  Log into [email or web address] and use [place]'s universal password   Go to> [facility]  and find provider  If you still have difficulty reaching the provider, please page the [facility] (Director on Call) for the Hospitalists listed on amion     "

## 2024-08-30 NOTE — Plan of Care (Signed)
  Problem: Health Behavior/Discharge Planning: Goal: Ability to manage health-related needs will improve Outcome: Progressing   Problem: Coping: Goal: Level of anxiety will decrease Outcome: Progressing   Problem: Elimination: Goal: Will not experience complications related to bowel motility Outcome: Progressing   Problem: Pain Managment: Goal: General experience of comfort will improve and/or be controlled Outcome: Progressing

## 2024-08-30 NOTE — Progress Notes (Signed)
 "                                                                                                                                                                                                          Daily Progress Note   Patient Name: Regina [name]       Date: 08/30/2024 DOB: 05-May-1933  Age: 89 y.o. MRN#: [id number] Attending Physician: Rizwan, Saima, MD Primary Care Physician: Valma Carwin, MD Admit Date: 08/19/2024  Reason for Consultation/Follow-up: Establishing goals of care   Length of Stay: 10  Current Medications: Scheduled Meds:   azithromycin   500 mg Oral Daily   carbidopa -levodopa   1 tablet Oral TID   docusate sodium   100 mg Oral BID   enoxaparin  (LOVENOX ) injection  40 mg Subcutaneous Q24H   feeding supplement  237 mL Oral TID BM   folic acid   1 mg Oral Daily   levothyroxine   75 mcg Oral Q0600   lidocaine   1 patch Transdermal Q24H   multivitamin with minerals  1 tablet Oral Daily   senna  1 tablet Oral BID   thiamine   100 mg Oral Daily    Continuous Infusions:  sodium chloride  100 mL/hr at 08/30/24 0523   cefTRIAXone  (ROCEPHIN )  IV 2 g (08/30/24 0830)    PRN Meds: acetaminophen  **OR** acetaminophen , artificial tears, hydrALAZINE , lactulose , ondansetron  **OR** ondansetron  (ZOFRAN ) IV, oxyCODONE   Physical Exam Vitals reviewed.  Constitutional:      General: She is awake. She is not in acute distress.    Appearance: She is ill-appearing.  HENT:     Head: Normocephalic and atraumatic.  Cardiovascular:     Rate and Rhythm: Normal rate.  Pulmonary:     Effort: Pulmonary effort is normal.  Skin:    General: Skin is warm and dry.  Neurological:     Mental Status: She is disoriented and confused.             Vital Signs: BP (!) 169/59 (BP Location: Left Arm)   Pulse 77   Temp 98.4 F (36.9 C) (Oral)   Resp 18   Ht 5' 6 (1.676 m)   Wt 64.1 kg   SpO2 99%   BMI 22.81 kg/m  SpO2: SpO2: 99 % O2 Device: O2 Device: Room Air O2 Flow Rate:          Palliative Assessment/Data: 30%      Patient Active Problem List   Diagnosis Date Noted   Ambulatory dysfunction 08/19/2024   Back pain 08/19/2024   Orthostatic hypotension 12/19/2023   Sinus bradycardia  12/19/2023   Dizziness 12/17/2023   Acute colitis 09/27/2022   Parkinsonism (HCC) 02/14/2022   UTI (urinary tract infection) 04/09/2021   Gait disorder 12/28/2020   Malnutrition of moderate degree 03/10/2018   Acute on chronic diastolic CHF (congestive heart failure) (HCC)    Hip pain    Acute cystitis without hematuria    Delirium    Memory loss    Ataxia 03/09/2018   Weakness generalized 03/09/2018   Acute renal failure superimposed on chronic kidney disease 03/09/2018   Generalized weakness 03/09/2018   Internal hemorrhoids with complication 08/03/2014   Ulcer of lower limb (HCC) 08/02/2014   Chronic diastolic heart failure (HCC) 08/01/2014   Right bundle branch block 08/01/2014   Essential hypertension 08/01/2014   Hypothyroidism 08/01/2014   Rectal bleeding 07/31/2014   Hypertension 07/31/2014   GERD (gastroesophageal reflux disease) 07/31/2014   Other specified anemias 07/31/2014   Lower GI bleeding    Faintness    Syncope 07/30/2014   Varicose veins of bilateral lower extremities with other complications 07/05/2013    Palliative Care Assessment & Plan   Patient Profile: 89 y.o. female  with past medical history of Parkinson's disease with dementia, HTN, hypothyroidism,  admitted on 08/19/2024 with pain in her rib cage after a fall. CT was negative for acute findings. She was started on pain medication- oxycontin10mg  po bid and became somnolent- oxycontin  was stopped and mental status improved. On 1/15 she developed fever and leucocytosis- unclear etiology ?pnuemonia- Palliative medicine consulted for goals of care.   Today's Discussion: Reviewed chart and received update from nursing. Patient lying in bed awake. She appears comfortable and looks better than  yesterday. She tells me she feels better today. She tells me she walked to the bathroom and back earlier--- her husband tells me she has been confused and has not been out of the bed today. Husband is upset about the patient's confusion-- he believes it is her oxycodone . We discussed it could be her oxycodone  but it could also be delirium. We discussed her pain appears to be better managed today with the oxycodone  on board.   Patient's husband shared that patient required some assistance with her breakfast but was able to feed herself toast. She has been drinking Ensures. Their goal remains treating the treatable and allowing time for outcomes.  They are hopeful patient will be able to go to rehab. Patient's husband shared that the patient success with PT/OT/RN services in the past with [facility] services at home.  Emotional support and therapeutic listening provided. Encouraged family to call PMT with questions or concerns.  PMT will continue to follow.  Recommendations/Plan: DNR/DNI Continue treating the treatable MOST form completed and placed in paper chart Family hopeful patient will be able to dc to SNF with rehab OP Palliative at discharge- TOC order placed Encouraged continued discussions re: goals of care PMT will continue to support   Code Status:    Code Status Orders  (From admission, onward)           Start     Ordered   08/19/24 1857  Do not attempt resuscitation (DNR)- Limited -Do Not Intubate (DNI)  Continuous       Question Answer Comment  If pulseless and not breathing No CPR or chest compressions.   In Pre-Arrest Conditions (Patient Is Breathing and Has A Pulse) Do not intubate. Provide all appropriate non-invasive medical interventions. Avoid ICU transfer unless indicated or required.   Consent: Discussion documented in EHR or advanced  directives reviewed      08/19/24 1857         Extensive chart review has been completed prior to seeing the patient  including labs, vital signs, imaging, progress/consult notes, orders, medications, and available advance directive documents.  Care plan was discussed with bedside RN   Time spent: 25 minutes  Thank you for allowing the Palliative Medicine Team to assist in the care of this patient.    Stephane CHRISTELLA Palin, NP  Please contact Palliative Medicine Team phone at [phone] for questions and concerns.       "

## 2024-08-31 DIAGNOSIS — E860 Dehydration: Secondary | ICD-10-CM | POA: Insufficient documentation

## 2024-08-31 DIAGNOSIS — A419 Sepsis, unspecified organism: Secondary | ICD-10-CM

## 2024-08-31 LAB — BASIC METABOLIC PANEL WITH GFR
Anion gap: 9 (ref 5–15)
BUN: 22 mg/dL (ref 8–23)
CO2: 23 mmol/L (ref 22–32)
Calcium: 8.9 mg/dL (ref 8.9–10.3)
Chloride: 102 mmol/L (ref 98–111)
Creatinine, Ser: 0.96 mg/dL (ref 0.44–1.00)
GFR, Estimated: 56 mL/min — ABNORMAL LOW
Glucose, Bld: 113 mg/dL — ABNORMAL HIGH (ref 70–99)
Potassium: 4.8 mmol/L (ref 3.5–5.1)
Sodium: 134 mmol/L — ABNORMAL LOW (ref 135–145)

## 2024-08-31 LAB — CULTURE, BLOOD (ROUTINE X 2)
Culture: NO GROWTH
Culture: NO GROWTH

## 2024-08-31 MED ORDER — ACETAMINOPHEN 325 MG PO TABS: 650.0000 mg | ORAL_TABLET | Freq: Four times a day (QID) | ORAL | Status: AC | PRN

## 2024-08-31 MED ORDER — SENNA 8.6 MG PO TABS: 1.0000 | ORAL_TABLET | Freq: Two times a day (BID) | ORAL | 0 refills | Status: AC

## 2024-08-31 MED ORDER — ENSURE PLUS HIGH PROTEIN PO LIQD: 237.0000 mL | Freq: Three times a day (TID) | ORAL | Status: AC

## 2024-08-31 MED ORDER — FOLIC ACID 1 MG PO TABS: 1.0000 mg | ORAL_TABLET | Freq: Every day | ORAL | Status: AC

## 2024-08-31 MED ORDER — OXYCODONE HCL 5 MG PO TABS: 5.0000 mg | ORAL_TABLET | ORAL | 0 refills | Status: AC | PRN

## 2024-08-31 MED ORDER — ADULT MULTIVITAMIN W/MINERALS CH: 1.0000 | ORAL_TABLET | Freq: Every day | ORAL | Status: AC

## 2024-08-31 NOTE — Plan of Care (Signed)
   Problem: Education: Goal: Knowledge of General Education information will improve Description Including pain rating scale, medication(s)/side effects and non-pharmacologic comfort measures Outcome: Progressing   Problem: Health Behavior/Discharge Planning: Goal: Ability to manage health-related needs will improve Outcome: Progressing

## 2024-08-31 NOTE — Progress Notes (Signed)
 [facility] arrived and picked up patient to transfer to [facility] facility. Given report to nurse Bruna at facility. All questions and concerns answered prior to discharge.

## 2024-08-31 NOTE — TOC Transition Note (Signed)
 Transition of Care [facility]) - Discharge Note   Patient Details  Name: Regina [name] MRN: [id number] Date of Birth: 1933-04-27  Transition of Care [facility]) CM/SW Contact:  NORMAN ASPEN, LCSW Phone Number: 08/31/2024, 1:59 PM   Clinical Narrative:     Pt medically cleared for dc to [facility] today. Pt and family aware and agreeable.  PTAR called at 1:55pm.  RN to call report to [phone].  No further IP CM needs.  Final next level of care: Skilled Nursing Facility Barriers to Discharge: Barriers Resolved   Patient Goals and CMS Choice Patient states their goals for this hospitalization and ongoing recovery are:: Rehab CMS Medicare.gov Compare Post Acute Care list provided to:: Patient Represenative (must comment) Choice offered to / list presented to : Spouse      Discharge Placement              Patient chooses bed at: [facility] [place] Patient to be transferred to facility by: PTAR Name of family member notified: spouse and daughter Patient and family notified of of transfer: 08/31/24  Discharge Plan and Services Additional resources added to the After Visit Summary for   In-house Referral: Clinical Social Work   Post Acute Care Choice: Skilled Nursing Facility          DME Arranged: N/A DME Agency: NA                  Social Drivers of Health (SDOH) Interventions SDOH Screenings   Food Insecurity: No Food Insecurity (08/19/2024)  Housing: Low Risk (08/19/2024)  Transportation Needs: No Transportation Needs (08/19/2024)  Utilities: Not At Risk (08/19/2024)  Social Connections: Unknown (08/19/2024)  Tobacco Use: Low Risk (08/25/2024)     Readmission Risk Interventions    08/31/2024    1:58 PM  Readmission Risk Prevention Plan  Post Dischage Appt Complete  Medication Screening Complete  Transportation Screening Complete

## 2024-08-31 NOTE — Discharge Summary (Signed)
 Physician Discharge Summary  Regina [name] FMW:[id number] DOB: 12-12-1932 DOA: 08/19/2024  PCP: Valma Carwin, MD  Admit date: 08/19/2024 Discharge date: 08/31/2024 Discharging to: SNF Recommendations for Outpatient Follow-up:  Please check Bmet in 1 wk  Follow and encourage oral fluid intake Palliative care to follow at facility Please recheck thyroid  function tests in a few weeks  Consults:  Palliative care     Discharge Diagnoses:   Principal Problem:   Ambulatory dysfunction Active Problems:   Sepsis due to pneumonia (HCC)   Chronic diastolic heart failure (HCC)   Essential hypertension   Weakness generalized   Parkinsonism (HCC)   Back pain   Dehydration     Brief hospital course: 89 year old female with Parkinson's disease, hypertension, and hypothyroidism who presented to the hospital after a fall.  She had extensive CT imaging and no acute issues were noted.  The patient however was in severe pain and was quite weak.  She complained of pain in her left hip and left lateral rib cage.  She was started on pain medication including OxyContin  10 mg p.o. twice a day and oxycodone  5 mg p.o. every 4 as needed.  A lidocaine  patch was also placed. The pain was controlled however, the patient was noted to be sleepy. After stopping the Oxycontin , she is more alert. Febrile to 101 on 1/15. Suspected to have pneumonia. CXR > b/l basilar infiltrates vs atelectasis. Hospital course complicated by dehydration and AKI which improved after IVF. Palliative care consulted. She is DNR and a MOST form has been addressed.    Assessment and Plan: Principal Problem:   Ambulatory dysfunction-Parkinson's disease with parkinson's dementia - In the setting of Parkinson's and an acute fall - She recently saw her primary neurologist who decided that it was best not to increase her Sinemet  - initially was minimally verbal and did not follow commands - her husband has been here every day and feeds her  himselft - subsequently improved and is able to feed herself and follow commands -  mobilizing poorly- will dc to SNF     Active Problems: Fever- Sepsis due to pneumonia  - noted to have a fever of 101 on 1/15 - WBC 21  > 24.3 - UA checked and not abnormal - blood cultures ordered - ? If aspirating- or may have aspirated when husband was feeling her while being somnolent-  SLP eval  ordered- she did well  -  CXR shows b/l basilar infiltrates vs atelectasis-  -  resp panel neg - no signs of cellulitis  - [facility] received 5 days of CTX and Azithromycin  for PNA - WBC improving   Lethargy  - much more alert after receiving more IVF   Dehydrated, hyponatremia AKI CKD stage IIIa - Na 125, Cr 1.14> 1.65> 1.34> 2.02 - treated with IVF  - Cr now 0.96 and Na 134  - encouraged oral intake   Acute left hip and left lateral chest wall pain- left rib fractures - Pain secondary to fall- but husband also says she often moans at home - Noted to be sleepy on my initial eval-  According to her husband, she was more lethargic than usual after OxyContin  started - Holding OxyContin  and managing with as needed oxycodone  and APAP- more alert - left 8th and 9th rib fractures noted on xray     Constipation - resolved after fleet enema - cont Senna   Possible UTI - UA from 1/9 revealed: Few bacteria, greater than 50 WBCs, negative nitrites -  No urine culture done - Treated with ceftriaxone  x 3 days   Macrocytic anemia - Vitamin B12 normal at 811 and folate level normal at 17.4 - No iron deficiency noted on lab work   Elevated TSH - TSH is 12 - free T4 is normal at 1.24 -   - possible sick euthyrods            Discharge Instructions   Allergies as of 08/31/2024       Reactions   Tape Other (See Comments)   TAPE PULLS OFF THE SKIN, SO PLEASE USE AN ALTERNATIVE   Penicillins Hives, Itching   Latex Rash, Other (See Comments)   NO Band-Aids!! Pulls off skin         Medication List      STOP taking these medications    acetaminophen  650 MG CR tablet Commonly known as: TYLENOL  Replaced by: acetaminophen  325 MG tablet       TAKE these medications    carbidopa -levodopa  25-100 MG tablet Commonly known as: Sinemet  Take 1 tablet by mouth 3 (three) times daily. The timing of this medication is very important.   acetaminophen  325 MG tablet Commonly known as: TYLENOL  Take 2 tablets (650 mg total) by mouth every 6 (six) hours as needed for mild pain (pain score 1-3) or fever (or Fever >/= 101). Replaces: acetaminophen  650 MG CR tablet   calcitRIOL  0.25 MCG capsule Commonly known as: ROCALTROL  Take 0.25 mcg by mouth daily.   feeding supplement Liqd Take 237 mLs by mouth 3 (three) times daily between meals.   folic acid  1 MG tablet Commonly known as: FOLVITE  Take 1 tablet (1 mg total) by mouth daily. Start taking on: September 01, 2024   multivitamin with minerals Tabs tablet Take 1 tablet by mouth daily. Start taking on: September 01, 2024   oxyCODONE  5 MG immediate release tablet Commonly known as: Oxy IR/ROXICODONE  Take 1 tablet (5 mg total) by mouth every 4 (four) hours as needed for moderate pain (pain score 4-6).   senna 8.6 MG Tabs tablet Commonly known as: SENOKOT Take 1 tablet (8.6 mg total) by mouth 2 (two) times daily.   Synthroid  75 MCG tablet Generic drug: levothyroxine  Take 75 mcg by mouth daily before breakfast.   Systane Ultra PF 0.4-0.3 % Soln Generic drug: Polyethyl Glyc-Propyl Glyc PF Place 1 drop into both eyes in the morning, at noon, and at bedtime.   valsartan 80 MG tablet Commonly known as: DIOVAN Take 80 mg by mouth daily.            The results of significant diagnostics from this hospitalization (including imaging, microbiology, ancillary and laboratory) are listed below for reference.    DG CHEST PORT 1 VIEW Result Date: 08/26/2024 CLINICAL DATA:  Fever EXAM: PORTABLE CHEST 1 VIEW COMPARISON:  Dec 17, 2023 FINDINGS:  Patient is rotated to the right. Bibasilar atelectasis or edema is noted with small pleural effusions. Nondisplaced left eighth and ninth rib fractures are noted. IMPRESSION: Bibasilar atelectasis or edema is noted with small pleural effusions. Nondisplaced left eighth and ninth rib fractures. Electronically Signed   By: Lynwood Landy Raddle M.D.   On: 08/26/2024 14:09   CT Cervical Spine Wo Contrast Result Date: 08/19/2024 CLINICAL DATA:  Polytrauma from fall. EXAM: CT HEAD WITHOUT CONTRAST CT CERVICAL SPINE WITHOUT CONTRAST TECHNIQUE: Multidetector CT imaging of the head and cervical spine was performed following the standard protocol without intravenous contrast. Multiplanar CT image reconstructions of the cervical spine were also  generated. RADIATION DOSE REDUCTION: This exam was performed according to the departmental dose-optimization program which includes automated exposure control, adjustment of the mA and/or kV according to patient size and/or use of iterative reconstruction technique. COMPARISON:  Head CT 12/17/2023 and cervical spine/head CT 03/24/2022 FINDINGS: CT HEAD FINDINGS Brain: Ventricles, cisterns and other CSF spaces are normal. There is chronic ischemic microvascular disease. There is no mass, mass effect, shift of midline structures or acute hemorrhage. No evidence of acute infarction. Vascular: No hyperdense vessel or unexpected calcification. Skull: Normal. Negative for fracture or focal lesion. Sinuses/Orbits: Orbits are normal. Paranasal sinuses are well developed with no air-fluid levels. Mucous retention cyst over the right maxillary sinus unchanged. Other: None. CT CERVICAL SPINE FINDINGS Alignment: Normal. Skull base and vertebrae: Mild to moderate spondylosis throughout the cervical spine to include uncovertebral joint spurring and facet arthropathy. Vertebral body heights are maintained. Atlantoaxial articulation is unremarkable. Mild left-sided neural foraminal narrowing at the C4-5  level with bilateral neural foraminal narrowing at the C5-6 level and mild left-sided neural foraminal narrowing at the C6-7 level. Soft tissues and spinal canal: Prevertebral soft tissues are normal. There is mild canal stenosis at the C5-6 level predominant due to posterior spurring. Disc levels: Disc space narrowing from the C4-5 level to the C6-7 level. Upper chest: No acute findings. Other: None. IMPRESSION: 1. No acute brain injury. 2. Chronic ischemic microvascular disease. 3. No acute cervical spine injury. 4. Mild to moderate spondylosis throughout the cervical spine with disc disease from the C4-5 level to the C6-7 level. Mild canal stenosis at the C5-6 level predominant due to posterior spurring. Electronically Signed   By: Toribio Agreste M.D.   On: 08/19/2024 14:57   CT Head Wo Contrast Result Date: 08/19/2024 CLINICAL DATA:  Polytrauma from fall. EXAM: CT HEAD WITHOUT CONTRAST CT CERVICAL SPINE WITHOUT CONTRAST TECHNIQUE: Multidetector CT imaging of the head and cervical spine was performed following the standard protocol without intravenous contrast. Multiplanar CT image reconstructions of the cervical spine were also generated. RADIATION DOSE REDUCTION: This exam was performed according to the departmental dose-optimization program which includes automated exposure control, adjustment of the mA and/or kV according to patient size and/or use of iterative reconstruction technique. COMPARISON:  Head CT 12/17/2023 and cervical spine/head CT 03/24/2022 FINDINGS: CT HEAD FINDINGS Brain: Ventricles, cisterns and other CSF spaces are normal. There is chronic ischemic microvascular disease. There is no mass, mass effect, shift of midline structures or acute hemorrhage. No evidence of acute infarction. Vascular: No hyperdense vessel or unexpected calcification. Skull: Normal. Negative for fracture or focal lesion. Sinuses/Orbits: Orbits are normal. Paranasal sinuses are well developed with no air-fluid levels.  Mucous retention cyst over the right maxillary sinus unchanged. Other: None. CT CERVICAL SPINE FINDINGS Alignment: Normal. Skull base and vertebrae: Mild to moderate spondylosis throughout the cervical spine to include uncovertebral joint spurring and facet arthropathy. Vertebral body heights are maintained. Atlantoaxial articulation is unremarkable. Mild left-sided neural foraminal narrowing at the C4-5 level with bilateral neural foraminal narrowing at the C5-6 level and mild left-sided neural foraminal narrowing at the C6-7 level. Soft tissues and spinal canal: Prevertebral soft tissues are normal. There is mild canal stenosis at the C5-6 level predominant due to posterior spurring. Disc levels: Disc space narrowing from the C4-5 level to the C6-7 level. Upper chest: No acute findings. Other: None. IMPRESSION: 1. No acute brain injury. 2. Chronic ischemic microvascular disease. 3. No acute cervical spine injury. 4. Mild to moderate spondylosis throughout the cervical  spine with disc disease from the C4-5 level to the C6-7 level. Mild canal stenosis at the C5-6 level predominant due to posterior spurring. Electronically Signed   By: Toribio Agreste M.D.   On: 08/19/2024 14:57   CT T-SPINE NO CHARGE Result Date: 08/19/2024 CLINICAL DATA:  Fall a couple days ago at work with back and rib pain. EXAM: CT THORACIC SPINE WITHOUT CONTRAST; CT LUMBAR SPINE WITHOUT CONTRAST TECHNIQUE: Multidetector CT images of the thoracic were obtained using the standard protocol without intravenous contrast. RADIATION DOSE REDUCTION: This exam was performed according to the departmental dose-optimization program which includes automated exposure control, adjustment of the mA and/or kV according to patient size and/or use of iterative reconstruction technique. COMPARISON:  CT abdomen/pelvis 09/27/2022. FINDINGS: THORACIC SPINE: Alignment: Moderate curvature of the thoracic spine convex left. Vertebrae: Subtle chronic anterior wedging of  a an upper thoracic vertebral body. There is no acute compression fracture. There is mild spondylosis throughout the thoracic spine. Schmorl's node of the inferior endplate of a lower thoracic vertebral body. No canal stenosis. No significant neural foraminal narrowing. Paraspinal and other soft tissues: Negative. Disc levels: Multilevel disc space narrowing throughout the thoracic spine. LUMBAR SPINE: Alignment: Curvature of the lumbar spine convex right. No subluxation/spondylolisthesis. Vertebrae: Moderate spondylosis throughout the lumbar spine to include facet arthropathy. Very subtle depression of the L3 superior endplate compared to the prior exam. This finding is likely not acute although is indeterminate. Paraspinal and other soft tissues: Negative. Disc levels: Disc space narrowing at all levels of the lumbar spine with relative sparing of the L2-3 level. Mild right-sided neural foraminal narrowing at the L2-3 level. Left-sided neural foraminal narrowing at the L3-4 level. Left-sided neural from narrowing at the L4-5 level. No significant canal stenosis. Extra-spinal structures will be discussed on patient's CT of the chest, abdomen and pelvis. IMPRESSION: 1. No acute compression fracture of the thoracic spine. 2. Very subtle depression of the L3 superior endplate compared to the prior exam. This finding is likely not acute, although is indeterminate. 3. Moderate spondylosis throughout the thoracic and lumbar spine with multilevel disc disease and neural foraminal narrowing as described. No significant canal stenosis. Electronically Signed   By: Toribio Agreste M.D.   On: 08/19/2024 14:47   CT L-SPINE NO CHARGE Result Date: 08/19/2024 CLINICAL DATA:  Fall a couple days ago at work with back and rib pain. EXAM: CT THORACIC SPINE WITHOUT CONTRAST; CT LUMBAR SPINE WITHOUT CONTRAST TECHNIQUE: Multidetector CT images of the thoracic were obtained using the standard protocol without intravenous contrast.  RADIATION DOSE REDUCTION: This exam was performed according to the departmental dose-optimization program which includes automated exposure control, adjustment of the mA and/or kV according to patient size and/or use of iterative reconstruction technique. COMPARISON:  CT abdomen/pelvis 09/27/2022. FINDINGS: THORACIC SPINE: Alignment: Moderate curvature of the thoracic spine convex left. Vertebrae: Subtle chronic anterior wedging of a an upper thoracic vertebral body. There is no acute compression fracture. There is mild spondylosis throughout the thoracic spine. Schmorl's node of the inferior endplate of a lower thoracic vertebral body. No canal stenosis. No significant neural foraminal narrowing. Paraspinal and other soft tissues: Negative. Disc levels: Multilevel disc space narrowing throughout the thoracic spine. LUMBAR SPINE: Alignment: Curvature of the lumbar spine convex right. No subluxation/spondylolisthesis. Vertebrae: Moderate spondylosis throughout the lumbar spine to include facet arthropathy. Very subtle depression of the L3 superior endplate compared to the prior exam. This finding is likely not acute although is indeterminate. Paraspinal and other soft  tissues: Negative. Disc levels: Disc space narrowing at all levels of the lumbar spine with relative sparing of the L2-3 level. Mild right-sided neural foraminal narrowing at the L2-3 level. Left-sided neural foraminal narrowing at the L3-4 level. Left-sided neural from narrowing at the L4-5 level. No significant canal stenosis. Extra-spinal structures will be discussed on patient's CT of the chest, abdomen and pelvis. IMPRESSION: 1. No acute compression fracture of the thoracic spine. 2. Very subtle depression of the L3 superior endplate compared to the prior exam. This finding is likely not acute, although is indeterminate. 3. Moderate spondylosis throughout the thoracic and lumbar spine with multilevel disc disease and neural foraminal narrowing as  described. No significant canal stenosis. Electronically Signed   By: Toribio Agreste M.D.   On: 08/19/2024 14:47   CT CHEST ABDOMEN PELVIS W CONTRAST Result Date: 08/19/2024 EXAM: CT CHEST, ABDOMEN AND PELVIS WITH CONTRAST 08/19/2024 01:57:43 PM TECHNIQUE: CT of the chest, abdomen and pelvis was performed with the administration of 80 mL of iohexol  (OMNIPAQUE ) 300 MG/ML solution. Multiplanar reformatted images are provided for review. Automated exposure control, iterative reconstruction, and/or weight based adjustment of the mA/kV was utilized to reduce the radiation dose to as low as reasonably achievable. COMPARISON: 09/27/2022 CLINICAL HISTORY: Polytrauma, blunt Back and rib pain FINDINGS: CHEST: MEDIASTINUM AND LYMPH NODES: Mild to moderate cardiomegaly. Multivessel coronary atherosclerosis. The central airways are clear. No mediastinal, hilar or axillary lymphadenopathy. LUNGS AND PLEURA: Mild intralobular septal thickening with hazy ground glass attenuation within the lungs. Elevation of the right hemidiaphragm. Posterior bibasilar dependent atelectasis. A few scattered areas of bullous change noted in the lower lobes. Biapical pleural parenchymal scarring. Small bilateral pleural effusions with bibasilar compressive atelectasis. No pneumothorax. ABDOMEN AND PELVIS: LIVER: Mild diffuse hepatic steatosis. 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 GALLBLADDER AND BILE DUCTS: Unremarkable. No biliary ductal dilatation. SPLEEN: No acute abnormality. PANCREAS: No acute abnormality. ADRENAL GLANDS: No acute abnormality. KIDNEYS, URETERS AND BLADDER: Small nonobstructive right lower pole calculus. No stones in the left kidney or ureters. No hydronephrosis. No perinephric or periureteral stranding. The urinary bladder is distended without focal abnormality. GI AND BOWEL: The stomach is completely decompressed. There is no bowel obstruction. Decompressed noninflamed  appendix. REPRODUCTIVE ORGANS: Age related atrophy of the uterus and ovaries. PERITONEUM AND RETROPERITONEUM: No ascites. No free air. VASCULATURE: Atherosclerosis throughout the aorta. Calcified atherosclerosis diffusely throughout the aorta iliac system. Aorta is normal in caliber. ABDOMINAL AND PELVIS LYMPH NODES: No lymphadenopathy. BONES AND SOFT TISSUES: Diffuse osteopenia. Exaggerated thoracic kyphosis with multilevel degenerative disc disease of the spine. Mild dextrocurvature of the thoracolumbar spine. External female catheter device noted. No acute osseous abnormality. No focal soft tissue abnormality. IMPRESSION: 1. No acute, traumatic injury within the chest, abdomen, or pelvis. 2. Cardiomegaly with findings of mild pulmonary edema and small bilateral pleural effusions. 3. Small nonobstructive right lower pole calyceal calculus. No hydronephrosis in either kidney. Electronically signed by: Rogelia Myers MD MD 08/19/2024 02:47 PM EST RP Workstation: [id number]   Labs:   Basic Metabolic Panel: Recent Labs  Lab 08/27/24 0533 08/28/24 0522 08/29/24 0513 08/30/24 1217 08/31/24 0429  NA 125* 127* 126* 133* 134*  K 4.9 4.9 4.8 5.1 4.8  CL 92* 97* 97* 103 102  CO2 24 22 21* 23 23  GLUCOSE 134* 107* 102* 156* 113*  BUN 50* 42* 34* 24* 22  CREATININE 1.65* 1.34* 1.08* 1.02* 0.96  CALCIUM 8.6* 8.0* 8.5* 8.7* 8.9  CBC: Recent Labs  Lab 08/26/24 1047 08/27/24 0533 08/28/24 0522 08/29/24 0513 08/30/24 0829  WBC 21.7* 24.3* 20.5* 17.6* 12.4*  NEUTROABS  --  20.1*  --   --   --   HGB 10.4* 9.6* 8.8* 10.5* 10.8*  HCT 30.8* 27.5* 26.3* 31.2* 32.1*  MCV 99.4 97.9 98.5 98.7 99.4  PLT 188 201 217 271 300         SIGNED:   True Atlas, MD  [facility] 08/31/2024, 12:14 PM Time taking on discharge: 50 minutes

## 2024-08-31 NOTE — Plan of Care (Signed)

## 2024-08-31 NOTE — Care Management Important Message (Signed)
 Important Message  Patient Details IM Letter given. Name: ADONIA PORADA MRN: [id number] Date of Birth: 05-22-1933   Important Message Given:  Yes - Medicare IM     Mervin Ramires 08/31/2024, 2:35 PM

## 2024-08-31 NOTE — Progress Notes (Signed)
" ° °  Palliative Medicine Inpatient Follow Up Note   HPI:  Regina [name] is an 89 y.o. female with past medical history of Parkinson's disease with dementia, HTN, hypothyroidism, admitted on 08/19/2024 with pain in her rib cage after a fall. CT was negative for acute findings. She was started on pain medication- oxycontin10mg  po bid and became somnolent- oxycontin  was stopped and mental status improved. On 1/15 she developed fever and leucocytosis thought to be related to pneumonia. She has had ongoing encephalopathy in the setting of acute illness and factors associated with hospitalization.   Palliative medicine consulted for goals of care.   Today's Discussion 08/31/2024  I reviewed the chart notes including nursing notes from Kathrine Burnet, progress notes from Dr. Earley. I also reviewed vital signs which show increased SBP last night, nursing flowsheets - eating roughly 50% of meals, medication administrations record - received 10mg  of oxycodone  in total yesterday, labs inclusive of BMP from this morning and CBC from 1/19, and imaging inclusive of CXR from 1/15.  Per chart review it appears delirium remains though is slowly improving.   I spoke with patients RN, Clancy who shares that Regina [name] has been disoriented for her. She otherwise endorses no concerns.     I met with Regina [name] this morning at bedside. She is awake and alert to self. She shares awareness of being in the hospital though does not vocalize understanding of which hospital she is in nor why she is here. She shares her biggest problem is her spouse though does not vocalize much beyond this.   Plan to transition to skilled nursing to improve deconditioning once medically optimized.   Questions and concerns addressed/Palliative Support Provided.   Objective Assessment: Vital Signs Vitals:   08/30/24 2349 08/31/24 0551  BP: (!) 172/53 (!) 154/55  Pulse: 92 80  Resp:  19  Temp:  98.4 F (36.9 C)  SpO2:  98%    Intake/Output  Summary (Last 24 hours) at 08/31/2024 1221 Last data filed at 08/31/2024 1000 Gross per 24 hour  Intake 1115 ml  Output 1450 ml  Net -335 ml   Last Weight  Most recent update: 08/19/2024  7:45 PM    Weight  64.1 kg (141 lb 5 oz)            Gen:  Elderly Caucasian F chronically ill appearing HEENT: Dry  mucous membranes CV: Regular rate and rhythm  PULM: On RA, breathing is even and nonlabored ABD: soft/nontender  EXT: No edema  Neuro: Alert and oriented  to person  SUMMARY OF RECOMMENDATIONS   DNAR/DNI  Allow time for outcomes through treating the treatable  Implementation of delirium precautions  Plan for skilled nursing once medically optimized and a bed is available  OP Palliative support on discharge  The PMT will remain available as needed ______________________________________________________________________________________ Rosaline Becton [facility]iative Medicine Team Team Cell Phone: [phone] Please utilize secure chat with additional questions, if there is no response within 30 minutes please call the above phone number  I personally spent a total of 31 minutes in the care of the patient today including preparing to see the patient, getting/reviewing separately obtained history, performing a medically appropriate exam/evaluation, counseling and educating, documenting clinical information in the EHR, independently interpreting results, and coordinating care.     "

## 2024-09-10 ENCOUNTER — Telehealth: Payer: Self-pay | Admitting: *Deleted

## 2024-09-10 NOTE — Telephone Encounter (Signed)
"   Dr Okey reviewed blood pressures 03/21/24 -04/10/24. Dr Okey had no changes   Reading have been sent H.I.M to transfer to the chart. "

## 2025-03-22 ENCOUNTER — Ambulatory Visit: Admitting: Neurology
